# Patient Record
Sex: Male | Born: 1945
Health system: Southern US, Community
[De-identification: ages and names within clinical notes are randomized; demographics above are authoritative.]

## PROBLEM LIST (undated history)

## (undated) DIAGNOSIS — Z87891 Personal history of nicotine dependence: Secondary | ICD-10-CM

## (undated) DIAGNOSIS — Z0000 Encounter for general adult medical examination without abnormal findings: Secondary | ICD-10-CM

## (undated) DIAGNOSIS — I480 Paroxysmal atrial fibrillation: Secondary | ICD-10-CM

## (undated) DIAGNOSIS — E785 Hyperlipidemia, unspecified: Secondary | ICD-10-CM

## (undated) DIAGNOSIS — I10 Essential (primary) hypertension: Secondary | ICD-10-CM

## (undated) DIAGNOSIS — I2510 Atherosclerotic heart disease of native coronary artery without angina pectoris: Secondary | ICD-10-CM

## (undated) DIAGNOSIS — I639 Cerebral infarction, unspecified: Secondary | ICD-10-CM

## (undated) DIAGNOSIS — Z8619 Personal history of other infectious and parasitic diseases: Secondary | ICD-10-CM

## (undated) DIAGNOSIS — Z87442 Personal history of urinary calculi: Secondary | ICD-10-CM

## (undated) DIAGNOSIS — F4321 Adjustment disorder with depressed mood: Secondary | ICD-10-CM

## (undated) DIAGNOSIS — K589 Irritable bowel syndrome without diarrhea: Secondary | ICD-10-CM

## (undated) DIAGNOSIS — R001 Bradycardia, unspecified: Secondary | ICD-10-CM

## (undated) DIAGNOSIS — D649 Anemia, unspecified: Secondary | ICD-10-CM

## (undated) DIAGNOSIS — C9210 Chronic myeloid leukemia, BCR/ABL-positive, not having achieved remission: Principal | ICD-10-CM

## (undated) DIAGNOSIS — K219 Gastro-esophageal reflux disease without esophagitis: Secondary | ICD-10-CM

## (undated) HISTORY — DX: Personal history of other infectious and parasitic diseases: Z86.19

## (undated) HISTORY — DX: Chronic myeloid leukemia, BCR/ABL-positive, not having achieved remission: C92.10

## (undated) HISTORY — DX: Adjustment disorder with depressed mood: F43.21

## (undated) HISTORY — DX: Hyperlipidemia, unspecified: E78.5

## (undated) HISTORY — DX: Essential (primary) hypertension: I10

## (undated) HISTORY — DX: Personal history of nicotine dependence: Z87.891

## (undated) HISTORY — DX: Irritable bowel syndrome without diarrhea: K58.9

## (undated) HISTORY — DX: Personal history of urinary calculi: Z87.442

## (undated) HISTORY — DX: Encounter for general adult medical examination without abnormal findings: Z00.00

## (undated) HISTORY — DX: Gastro-esophageal reflux disease without esophagitis: K21.9

## (undated) HISTORY — PX: CATARACT EXTRACTION: SUR2

## (undated) HISTORY — DX: Anemia, unspecified: D64.9

---

## 1998-12-27 HISTORY — PX: CORONARY ANGIOPLASTY WITH STENT PLACEMENT: SHX49

## 2001-12-27 HISTORY — PX: CHOLECYSTECTOMY: SHX55

## 2011-06-21 ENCOUNTER — Emergency Department (INDEPENDENT_AMBULATORY_CARE_PROVIDER_SITE_OTHER): Payer: Self-pay

## 2011-06-21 ENCOUNTER — Emergency Department (HOSPITAL_BASED_OUTPATIENT_CLINIC_OR_DEPARTMENT_OTHER)
Admission: EM | Admit: 2011-06-21 | Discharge: 2011-06-21 | Disposition: A | Discharge status: Home / Self Care | Payer: Self-pay | Attending: Emergency Medicine | Admitting: Emergency Medicine

## 2011-06-21 DIAGNOSIS — I2510 Atherosclerotic heart disease of native coronary artery without angina pectoris: Secondary | ICD-10-CM | POA: Insufficient documentation

## 2011-06-21 DIAGNOSIS — R42 Dizziness and giddiness: Secondary | ICD-10-CM

## 2011-06-21 DIAGNOSIS — I10 Essential (primary) hypertension: Secondary | ICD-10-CM | POA: Insufficient documentation

## 2011-06-21 DIAGNOSIS — E7800 Pure hypercholesterolemia, unspecified: Secondary | ICD-10-CM | POA: Insufficient documentation

## 2011-06-21 DIAGNOSIS — R0789 Other chest pain: Secondary | ICD-10-CM

## 2011-06-21 DIAGNOSIS — E876 Hypokalemia: Secondary | ICD-10-CM | POA: Insufficient documentation

## 2011-06-21 LAB — CBC
HCT: 38 % — ABNORMAL LOW (ref 39.0–52.0)
MCH: 32 pg (ref 26.0–34.0)
MCHC: 35 g/dL (ref 30.0–36.0)
MCV: 91.6 fL (ref 78.0–100.0)
RDW: 14.2 % (ref 11.5–15.5)

## 2011-06-21 LAB — COMPREHENSIVE METABOLIC PANEL
Alkaline Phosphatase: 71 U/L (ref 39–117)
BUN: 17 mg/dL (ref 6–23)
Calcium: 9.4 mg/dL (ref 8.4–10.5)
GFR calc Af Amer: >60 mL/min (ref >60)
Glucose, Bld: 159 mg/dL — ABNORMAL HIGH (ref 70–99)
Total Protein: 7.3 g/dL (ref 6.0–8.3)

## 2011-06-21 LAB — URINALYSIS, ROUTINE W REFLEX MICROSCOPIC
Ketones, ur: NEGATIVE mg/dL
Leukocytes, UA: NEGATIVE
Nitrite: NEGATIVE
Specific Gravity, Urine: 1.022 (ref 1.005–1.030)
pH: 6 (ref 5.0–8.0)

## 2011-06-28 ENCOUNTER — Ambulatory Visit: Payer: Self-pay | Admitting: Hematology & Oncology

## 2011-07-16 ENCOUNTER — Ambulatory Visit (HOSPITAL_BASED_OUTPATIENT_CLINIC_OR_DEPARTMENT_OTHER): Payer: Self-pay | Admitting: Hematology & Oncology

## 2011-07-16 ENCOUNTER — Other Ambulatory Visit: Payer: Self-pay | Admitting: Hematology & Oncology

## 2011-07-16 DIAGNOSIS — C9210 Chronic myeloid leukemia, BCR/ABL-positive, not having achieved remission: Secondary | ICD-10-CM

## 2011-07-16 LAB — COMPREHENSIVE METABOLIC PANEL
Alkaline Phosphatase: 62 U/L (ref 39–117)
BUN: 16 mg/dL (ref 6–23)
Creatinine, Ser: 1.38 mg/dL — ABNORMAL HIGH (ref 0.50–1.35)
Glucose, Bld: 92 mg/dL (ref 70–99)
Total Bilirubin: 0.3 mg/dL (ref 0.3–1.2)

## 2011-07-16 LAB — CBC WITH DIFFERENTIAL (CANCER CENTER ONLY)
BASO#: 0.1 10*3/uL (ref 0.0–0.2)
BASO%: 1 % (ref 0.0–2.0)
EOS%: 5.9 % (ref 0.0–7.0)
HGB: 12 g/dL — ABNORMAL LOW (ref 13.0–17.1)
LYMPH#: 3.5 10*3/uL — ABNORMAL HIGH (ref 0.9–3.3)
MCHC: 35.1 g/dL (ref 32.0–35.9)
NEUT#: 5.5 10*3/uL (ref 1.5–6.5)
Platelets: 224 10*3/uL (ref 145–400)

## 2011-07-16 LAB — RETICULOCYTES (CHCC)
RBC.: 3.73 MIL/uL — ABNORMAL LOW (ref 4.22–5.81)
Retic Ct Pct: 0.8 % (ref 0.4–2.3)

## 2011-07-16 LAB — CHCC SATELLITE - SMEAR

## 2011-07-20 LAB — BCR/ABL (LIO MMD)

## 2011-09-29 ENCOUNTER — Other Ambulatory Visit: Payer: Self-pay | Admitting: Hematology & Oncology

## 2011-09-29 ENCOUNTER — Encounter: Payer: Self-pay | Admitting: Hematology & Oncology

## 2011-09-29 LAB — CBC WITH DIFFERENTIAL (CANCER CENTER ONLY)
Eosinophils Absolute: 0.6 10*3/uL — ABNORMAL HIGH (ref 0.0–0.5)
HCT: 33.3 % — ABNORMAL LOW (ref 38.7–49.9)
LYMPH#: 3.2 10*3/uL (ref 0.9–3.3)
LYMPH%: 30.1 % (ref 14.0–48.0)
MCV: 94 fL (ref 82–98)
MONO#: 0.9 10*3/uL (ref 0.1–0.9)
NEUT%: 54.9 % (ref 40.0–80.0)
RBC: 3.53 10*6/uL — ABNORMAL LOW (ref 4.20–5.70)
WBC: 10.6 10*3/uL — ABNORMAL HIGH (ref 4.0–10.0)

## 2011-09-29 LAB — BASIC METABOLIC PANEL
BUN: 12 mg/dL (ref 6–23)
Chloride: 103 mEq/L (ref 96–112)
Glucose, Bld: 80 mg/dL (ref 70–99)
Potassium: 4.3 mEq/L (ref 3.5–5.3)
Sodium: 138 mEq/L (ref 135–145)

## 2011-09-29 LAB — CHCC SATELLITE - SMEAR

## 2011-10-04 LAB — BCR/ABL (LIO MMD)

## 2011-12-16 ENCOUNTER — Ambulatory Visit (HOSPITAL_BASED_OUTPATIENT_CLINIC_OR_DEPARTMENT_OTHER): Payer: Self-pay | Admitting: Hematology & Oncology

## 2011-12-16 ENCOUNTER — Other Ambulatory Visit: Payer: Self-pay | Admitting: Hematology & Oncology

## 2011-12-16 ENCOUNTER — Encounter: Payer: Self-pay | Admitting: Hematology & Oncology

## 2011-12-16 ENCOUNTER — Other Ambulatory Visit (HOSPITAL_BASED_OUTPATIENT_CLINIC_OR_DEPARTMENT_OTHER): Payer: Self-pay | Admitting: Lab

## 2011-12-16 DIAGNOSIS — C9210 Chronic myeloid leukemia, BCR/ABL-positive, not having achieved remission: Secondary | ICD-10-CM | POA: Insufficient documentation

## 2011-12-16 HISTORY — DX: Chronic myeloid leukemia, BCR/ABL-positive, not having achieved remission: C92.10

## 2011-12-16 LAB — CBC WITH DIFFERENTIAL (CANCER CENTER ONLY)
BASO#: 0.1 10*3/uL (ref 0.0–0.2)
BASO%: 0.8 % (ref 0.0–2.0)
HCT: 36.3 % — ABNORMAL LOW (ref 38.7–49.9)
HGB: 12.4 g/dL — ABNORMAL LOW (ref 13.0–17.1)
LYMPH#: 3.1 10*3/uL (ref 0.9–3.3)
LYMPH%: 29.8 % (ref 14.0–48.0)
MCV: 96 fL (ref 82–98)
MONO#: 0.7 10*3/uL (ref 0.1–0.9)
NEUT%: 56.5 % (ref 40.0–80.0)
RDW: 13.7 % (ref 11.1–15.7)
WBC: 10.5 10*3/uL — ABNORMAL HIGH (ref 4.0–10.0)

## 2011-12-16 LAB — COMPREHENSIVE METABOLIC PANEL
AST: 19 U/L (ref 0–37)
Albumin: 4.4 g/dL (ref 3.5–5.2)
BUN: 16 mg/dL (ref 6–23)
CO2: 27 mEq/L (ref 19–32)
Calcium: 9.3 mg/dL (ref 8.4–10.5)
Chloride: 106 mEq/L (ref 96–112)
Creatinine, Ser: 1.19 mg/dL (ref 0.50–1.35)
Glucose, Bld: 81 mg/dL (ref 70–99)
Potassium: 3.7 mEq/L (ref 3.5–5.3)

## 2011-12-16 LAB — LACTATE DEHYDROGENASE: LDH: 153 U/L (ref 94–250)

## 2011-12-16 NOTE — Progress Notes (Signed)
This office note has been dictated.

## 2011-12-17 NOTE — Progress Notes (Signed)
CC:   Chauncy Lean, PA-C  DIAGNOSIS:  Chronic phase CML.  CURRENT THERAPY:  [name] 400 mg p.o. daily.  INTERIM HISTORY:  Mr. Grider comes in for his follow-up.  He is looking great.  He has done very well since we last saw him in October.  He has had no real complaints.  He does have occasional diarrhea.  I think he takes Imodium for this.  When we last saw him, his bcr/abl ratio was not detected.  As such, he still has maintained a major molecular remission.  He has not had any cough.  He has had no dysphagia or odynophagia.  He has had no nausea.  He has had no headache.  He has had no rashes.  Overall, his performance status is ECOG 0.  PHYSICAL EXAMINATION:  General Appearance:  This is a well-developed, well-nourished white gentleman in no obvious distress.  Vital Signs: 97.1, pulse 76, respiratory rate 20, blood pressure 123/73.  Weight is 205.  Head and Neck Exam:  Shows a normocephalic, atraumatic skull. There are no ocular or oral lesions.  He has no palpable cervical or supraclavicular lymph nodes.  Lungs:  Clear bilaterally.  Cardiac Exam: Regular rate and rhythm with a normal S1 and S2.  There are no murmurs, rubs or bruits.  Abdominal Exam:  Soft with good bowel sounds.  There is no palpable abdominal mass.  There is no fluid wave.  There is no palpable hepatosplenomegaly.  Back Exam:  No tenderness over the spine, ribs or hips.  Extremities:  Show no clubbing, cyanosis or edema.  He has good range of motion of his joints.  Skin Exam:  No rashes, ecchymosis, or petechia.  LABORATORY STUDIES:  White cell count is 10.5, hemoglobin 12.4, hematocrit 36.3, platelet count 256.  MCV is 96.  Peripheral smear shows good maturation of his white blood cells.  There are no hypersegmented polys.  I see no atypical lymphocytes.  There are no immature myeloid cells.  There are no blasts.  Red cells appear normochromic, normocytic.  There are no nucleated red blood  cells. Platelets are adequate in number and size.  IMPRESSION:  Mr. Closs is a 65 year old gentleman with chronic phase CML.  He is on [name].  He is doing well on [name].  He has maintained a major molecular remission.  We will plan to get him back in 3 more months now.  I think that once we see him back, if he still has maintained a major molecular remission, we can probably check him every 6 months.  He will continue Imodium for the intermittent diarrhea.  He has Lomotil if he needs it.    ______________________________ Josph Macho, M.D. PRE/MEDQ  D:  12/16/2011  T:  12/17/2011  Job:  [id number]

## 2011-12-22 LAB — BCR/ABL (LIO MMD)

## 2011-12-29 ENCOUNTER — Other Ambulatory Visit: Payer: Self-pay | Admitting: Hematology & Oncology

## 2012-02-21 ENCOUNTER — Encounter: Payer: Self-pay | Admitting: *Deleted

## 2012-02-21 NOTE — Progress Notes (Signed)
Pt's wife called asking to have her husband's HCTZ refilled. Reviewed all of his records. Did not find that Dr Myna Hidalgo had ever prescribed it. He has a history of HTN and hyperlipidemia. Returned his wife's call at [phone] and she stated that he did not like the MD who was prescribing his medication therefore he was not going back. She asked if there was someone in the same building as Dr Myna Hidalgo that he could go to. Gave her the # to Dr Rodena Medin. Explained the importance of having a PCP to deal with his blood pressure and high lipid conditions. She verbalized understanding and will give Dr Rodena Medin a call.

## 2012-02-24 ENCOUNTER — Encounter: Payer: Self-pay | Admitting: Internal Medicine

## 2012-02-24 ENCOUNTER — Ambulatory Visit (INDEPENDENT_AMBULATORY_CARE_PROVIDER_SITE_OTHER): Payer: Self-pay | Admitting: Internal Medicine

## 2012-02-24 DIAGNOSIS — Z87891 Personal history of nicotine dependence: Secondary | ICD-10-CM

## 2012-02-24 DIAGNOSIS — E782 Mixed hyperlipidemia: Secondary | ICD-10-CM | POA: Insufficient documentation

## 2012-02-24 DIAGNOSIS — F17200 Nicotine dependence, unspecified, uncomplicated: Secondary | ICD-10-CM

## 2012-02-24 DIAGNOSIS — I10 Essential (primary) hypertension: Secondary | ICD-10-CM | POA: Insufficient documentation

## 2012-02-24 DIAGNOSIS — E785 Hyperlipidemia, unspecified: Secondary | ICD-10-CM

## 2012-02-24 DIAGNOSIS — I2510 Atherosclerotic heart disease of native coronary artery without angina pectoris: Secondary | ICD-10-CM | POA: Insufficient documentation

## 2012-02-24 DIAGNOSIS — C9210 Chronic myeloid leukemia, BCR/ABL-positive, not having achieved remission: Secondary | ICD-10-CM

## 2012-02-24 DIAGNOSIS — Z720 Tobacco use: Secondary | ICD-10-CM

## 2012-02-24 HISTORY — DX: Personal history of nicotine dependence: Z87.891

## 2012-02-24 LAB — LIPID PANEL: LDL Cholesterol: 107 mg/dL — ABNORMAL HIGH (ref 0–99)

## 2012-02-24 MED ORDER — HYDROCHLOROTHIAZIDE 25 MG PO TABS: 25.0000 mg | ORAL_TABLET | Freq: Every day | ORAL | Status: DC

## 2012-02-24 MED ORDER — LISINOPRIL 40 MG PO TABS: 40.0000 mg | ORAL_TABLET | Freq: Every day | ORAL | Status: DC

## 2012-02-24 NOTE — Assessment & Plan Note (Signed)
Normotensive and stable. Continue current regimen. Monitor bp as outpt and followup in clinic as scheduled. rf medications. chem7 12/12.

## 2012-02-24 NOTE — Progress Notes (Signed)
  Subjective:    Patient ID: Jason [name], male    DOB: 06-03-1946, 66 y.o.   MRN: [id number]  HPI Pt presents to clinic to establish care and for follow up of multiple medical problems. H/o CML under treatment of H/O with gleevac. Tolerates medication well now that he takes it at night. BP under average control. Recalls h/o CAD with stent placement followed by Dr. Chales Abrahams of cardiology. Believes underwent unremarkable stress test within the last ~ 1 year. Does continue to smoke tobacco daily-understands the potential health dangers and is currently contemplative. Thinks he may take lipitor (unknown dosage) but does not have it on his medication list. Not interested in tetanus, zostavax or colonoscopy.   Past Medical History  Diagnosis Date  . CML (chronic myelocytic leukemia) 12/16/2011   No past surgical history on file.  reports that he has been smoking Cigarettes.  He has never used smokeless tobacco. He reports that he drinks alcohol. He reports that he does not use illicit drugs. family history is not on file. No Known Allergies   Review of Systems  Constitutional: Positive for fever.  Respiratory: Negative for cough and shortness of breath.   Cardiovascular: Negative for chest pain.  Gastrointestinal: Negative for abdominal pain.  All other systems reviewed and are negative.       Objective:   Physical Exam  Physical Exam  Nursing note and vitals reviewed. Constitutional: Appears well-developed and well-nourished. No distress.  HENT:  Head: Normocephalic and atraumatic.  Right Ear: External ear normal.  Left Ear: External ear normal.  Eyes: Conjunctivae are normal. No scleral icterus.  Neck: Neck supple. Carotid bruit is not present.  Cardiovascular: Normal rate, regular rhythm and normal heart sounds.  Exam reveals no gallop and no friction rub.   No murmur heard. Pulmonary/Chest: Effort normal and breath sounds normal. No respiratory distress. He has no wheezes. no  rales.  Lymphadenopathy:    He has no cervical adenopathy.  Neurological:Alert.  Skin: Skin is warm and dry. Not diaphoretic.  Psychiatric: Has a normal mood and affect.        Assessment & Plan:

## 2012-02-24 NOTE — Assessment & Plan Note (Signed)
Followed by cardiology. Asx. Continue asa and risk factor modification.

## 2012-02-24 NOTE — Assessment & Plan Note (Signed)
Counseled regarding the need for cessation. Understands the risks in tobacco use including but not limited to MI, lung/oral/head/neck malignancy and copd. Currently contemplative. Total time of discussion ~4 minutes

## 2012-02-24 NOTE — Assessment & Plan Note (Signed)
Obtain lipid profile. Check lipitor dose at home and notify clinic. lft nl 12/12

## 2012-03-03 ENCOUNTER — Telehealth: Payer: Self-pay | Admitting: Internal Medicine

## 2012-03-03 DIAGNOSIS — E785 Hyperlipidemia, unspecified: Secondary | ICD-10-CM

## 2012-03-03 MED ORDER — ATORVASTATIN CALCIUM 10 MG PO TABS: 10.0000 mg | ORAL_TABLET | Freq: Every day | ORAL | Status: DC

## 2012-03-03 NOTE — Telephone Encounter (Signed)
Call placed to patient at [phone], he was informed per Dr Rodena Medin instructions and has verbalized understanding. Rx for Lipitor 10 mg sent to pharmacy on file. Lab order entered for April 2013.

## 2012-03-03 NOTE — Telephone Encounter (Signed)
Call returned to patient at [phone], patients wife Jason [name] stated patient requested her to find out what was needed. She was asked for patients current dose of Lipitor. She stated patient has a 10 mg tablet, but he has not been taking the medication.

## 2012-03-03 NOTE — Telephone Encounter (Signed)
Recommend resume lipitor 10mg  qd. Recheck lipid/lft one month 272.4

## 2012-03-22 ENCOUNTER — Ambulatory Visit (HOSPITAL_BASED_OUTPATIENT_CLINIC_OR_DEPARTMENT_OTHER): Payer: Self-pay | Admitting: Hematology & Oncology

## 2012-03-22 ENCOUNTER — Other Ambulatory Visit (HOSPITAL_BASED_OUTPATIENT_CLINIC_OR_DEPARTMENT_OTHER): Payer: Self-pay | Admitting: Lab

## 2012-03-22 VITALS — BP 133/81 | HR 75 | Temp 97.0°F | Ht 66.75 in | Wt 207.0 lb

## 2012-03-22 DIAGNOSIS — C9210 Chronic myeloid leukemia, BCR/ABL-positive, not having achieved remission: Secondary | ICD-10-CM

## 2012-03-22 LAB — CBC WITH DIFFERENTIAL (CANCER CENTER ONLY)
BASO#: 0.1 10*3/uL (ref 0.0–0.2)
Eosinophils Absolute: 0.7 10*3/uL — ABNORMAL HIGH (ref 0.0–0.5)
HGB: 12.5 g/dL — ABNORMAL LOW (ref 13.0–17.1)
LYMPH#: 2.8 10*3/uL (ref 0.9–3.3)
MONO#: 0.8 10*3/uL (ref 0.1–0.9)
NEUT#: 5.1 10*3/uL (ref 1.5–6.5)
Platelets: 222 10*3/uL (ref 145–400)
RBC: 3.88 10*6/uL — ABNORMAL LOW (ref 4.20–5.70)
WBC: 9.5 10*3/uL (ref 4.0–10.0)

## 2012-03-22 LAB — COMPREHENSIVE METABOLIC PANEL
ALT: 20 U/L (ref 0–53)
Albumin: 4.6 g/dL (ref 3.5–5.2)
CO2: 26 mEq/L (ref 19–32)
Calcium: 9.4 mg/dL (ref 8.4–10.5)
Chloride: 105 mEq/L (ref 96–112)
Glucose, Bld: 87 mg/dL (ref 70–99)
Potassium: 3.4 mEq/L — ABNORMAL LOW (ref 3.5–5.3)
Sodium: 143 mEq/L (ref 135–145)
Total Bilirubin: 0.4 mg/dL (ref 0.3–1.2)
Total Protein: 7 g/dL (ref 6.0–8.3)

## 2012-03-22 LAB — LACTATE DEHYDROGENASE: LDH: 142 U/L (ref 94–250)

## 2012-03-22 LAB — CHCC SATELLITE - SMEAR

## 2012-03-22 NOTE — Progress Notes (Signed)
This office note has been dictated.

## 2012-03-22 NOTE — Progress Notes (Signed)
CC:   Chauncy Lean, PA-C  DIAGNOSIS:  Chronic phase chronic myelogenous leukemia, molecular remission.  CURRENT THERAPY:  [name] 400 mg p.o. daily.  INTERIM HISTORY:  Mr. Jason [name] comes in for followup.  We see him every 3 months or so.  When we last saw him in December, his BCR/ABL was not detectable.  He feels well.  He has had no complaints.  He has had some loose stools. He is on a probiotic for this.  He has not had any issues with respect to blood sugars.  He has had no problems with high blood pressure.  He is still smoking.  He smokes about 5 cigarettes a day.  He has not had any problems with bleeding or bruising.  He has had no nausea or vomiting.  PHYSICAL EXAMINATION:  General:  This is a well-developed, well- nourished white gentleman in no obvious distress.  Vital Signs:  Show a temperature of 97, pulse 75, respiratory rate 14, blood pressure is 133/81.  Weight is 207.  Head and Neck Exam:  Shows a normocephalic, atraumatic skull.  There are no ocular or oral lesions.  There are no palpable cervical or supraclavicular lymph nodes.  Lungs:  Clear bilaterally.  Cardiac Exam:  Regular rate and rhythm with normal S1 and S2.  There are no murmurs, rubs or bruits.  Abdominal Exam:  Soft with good bowel sounds.  There is no palpable abdominal mass.  There is no fluid wave.  There is no palpable hepatosplenomegaly.  Extremities: Show no clubbing, cyanosis, or edema.  Neurological Exam:  Shows no focal neurological deficits.  LABORATORY STUDIES:  White cell count 9.5, hemoglobin 12.5, hematocrit 37, platelet count 222.  IMPRESSION:  Mr. Jason [name] is a nice 66 year old gentleman with chronic phase chronic myelogenous leukemia.  He is in a major molecular remission.  He is having his BCR/ABL ratios checked with each visit.  I really think that we can get him back now in 4 months.  I do not see that we need any blood work in between  visits.    ______________________________ Jason [name], M.D. PRE/MEDQ  D:  03/22/2012  T:  03/22/2012  Job:  [id number]  ADDENDUM:  BCR/ABL is (-).  He is in MMR.

## 2012-03-29 ENCOUNTER — Other Ambulatory Visit: Payer: Self-pay | Admitting: Hematology & Oncology

## 2012-04-03 ENCOUNTER — Telehealth: Payer: Self-pay | Admitting: *Deleted

## 2012-04-03 NOTE — Telephone Encounter (Addendum)
Message copied by Mirian Capuchin on Mon Apr 03, 2012  4:27 PM ------      Message from: Josph Macho      Created: Sun Apr 02, 2012  2:45 PM       Call- CML still in remission. Pt's wife given this message.  Voiced understanding.

## 2012-06-23 DIAGNOSIS — H268 Other specified cataract: Secondary | ICD-10-CM | POA: Insufficient documentation

## 2012-07-03 ENCOUNTER — Emergency Department (HOSPITAL_BASED_OUTPATIENT_CLINIC_OR_DEPARTMENT_OTHER): Payer: Self-pay

## 2012-07-03 ENCOUNTER — Emergency Department (HOSPITAL_BASED_OUTPATIENT_CLINIC_OR_DEPARTMENT_OTHER)
Admission: EM | Admit: 2012-07-03 | Discharge: 2012-07-03 | Disposition: A | Discharge status: Home / Self Care | Payer: Self-pay | Attending: Emergency Medicine | Admitting: Emergency Medicine

## 2012-07-03 ENCOUNTER — Encounter (HOSPITAL_BASED_OUTPATIENT_CLINIC_OR_DEPARTMENT_OTHER): Payer: Self-pay | Admitting: *Deleted

## 2012-07-03 DIAGNOSIS — R51 Headache: Secondary | ICD-10-CM | POA: Insufficient documentation

## 2012-07-03 DIAGNOSIS — Z87442 Personal history of urinary calculi: Secondary | ICD-10-CM | POA: Insufficient documentation

## 2012-07-03 DIAGNOSIS — R110 Nausea: Secondary | ICD-10-CM | POA: Insufficient documentation

## 2012-07-03 DIAGNOSIS — R42 Dizziness and giddiness: Secondary | ICD-10-CM | POA: Insufficient documentation

## 2012-07-03 DIAGNOSIS — Z7982 Long term (current) use of aspirin: Secondary | ICD-10-CM | POA: Insufficient documentation

## 2012-07-03 DIAGNOSIS — Z79899 Other long term (current) drug therapy: Secondary | ICD-10-CM | POA: Insufficient documentation

## 2012-07-03 DIAGNOSIS — E785 Hyperlipidemia, unspecified: Secondary | ICD-10-CM | POA: Insufficient documentation

## 2012-07-03 DIAGNOSIS — F17200 Nicotine dependence, unspecified, uncomplicated: Secondary | ICD-10-CM | POA: Insufficient documentation

## 2012-07-03 DIAGNOSIS — I10 Essential (primary) hypertension: Secondary | ICD-10-CM | POA: Insufficient documentation

## 2012-07-03 DIAGNOSIS — R209 Unspecified disturbances of skin sensation: Secondary | ICD-10-CM | POA: Insufficient documentation

## 2012-07-03 DIAGNOSIS — R197 Diarrhea, unspecified: Secondary | ICD-10-CM | POA: Insufficient documentation

## 2012-07-03 LAB — CBC WITH DIFFERENTIAL/PLATELET
Eosinophils Relative: 6 % — ABNORMAL HIGH (ref 0–5)
HCT: 39 % (ref 39.0–52.0)
Hemoglobin: 13.4 g/dL (ref 13.0–17.0)
Lymphocytes Relative: 30 % (ref 12–46)
Lymphs Abs: 3.1 10*3/uL (ref 0.7–4.0)
MCV: 92.6 fL (ref 78.0–100.0)
Monocytes Relative: 9 % (ref 3–12)
Platelets: 237 10*3/uL (ref 150–400)
RBC: 4.21 MIL/uL — ABNORMAL LOW (ref 4.22–5.81)
WBC: 10.2 10*3/uL (ref 4.0–10.5)

## 2012-07-03 LAB — COMPREHENSIVE METABOLIC PANEL
ALT: 22 U/L (ref 0–53)
Alkaline Phosphatase: 66 U/L (ref 39–117)
CO2: 30 mEq/L (ref 19–32)
Calcium: 9.5 mg/dL (ref 8.4–10.5)
GFR calc Af Amer: 65 mL/min — ABNORMAL LOW (ref >90)
GFR calc non Af Amer: 56 mL/min — ABNORMAL LOW (ref >90)
Glucose, Bld: 95 mg/dL (ref 70–99)
Sodium: 142 mEq/L (ref 135–145)

## 2012-07-03 LAB — URINALYSIS, ROUTINE W REFLEX MICROSCOPIC
Bilirubin Urine: NEGATIVE
Ketones, ur: NEGATIVE mg/dL
Nitrite: NEGATIVE
Specific Gravity, Urine: 1.017 (ref 1.005–1.030)
Urobilinogen, UA: 0.2 mg/dL (ref 0.0–1.0)

## 2012-07-03 MED ORDER — SODIUM CHLORIDE 0.9 % IV BOLUS (SEPSIS)
500.0000 mL | Freq: Once | INTRAVENOUS | Status: AC
  Administered 2012-07-03: 500 mL via INTRAVENOUS

## 2012-07-03 MED ORDER — ONDANSETRON 8 MG PO TBDP: 8.0000 mg | ORAL_TABLET | Freq: Three times a day (TID) | ORAL | Status: AC | PRN

## 2012-07-03 MED ORDER — ONDANSETRON 8 MG PO TBDP
8.0000 mg | ORAL_TABLET | Freq: Once | ORAL | Status: AC
  Administered 2012-07-03: 8 mg via ORAL
  Filled 2012-07-03: qty 1

## 2012-07-03 NOTE — ED Notes (Signed)
Patient states he has had diarrhea multiple times a day for the last 2 days.

## 2012-07-03 NOTE — ED Provider Notes (Signed)
History     CSN: [id number]  Arrival date & time 07/03/12  1239   First MD Initiated Contact with Patient 07/03/12 1350      2:29 PM HPI Patient reports Dizziness that he describes as feeling flushed and light headed. States symptoms began 2 days ago and have been constant. Reports associated with nausea and left hand tingling. Denies headache, CP, SOB, aphasia, ataxia, recent URI, h/o CVA or TIA. Pt has a H/o 2 stents and similar symptoms. Reports last time he had dizziness his potassium was low. Reports diarrhea but this is typical due to his medications The history is provided by the patient.    Past Medical History  Diagnosis Date  . CML (chronic myelocytic leukemia) 12/16/2011  . History of chicken pox   . Hypertension   . Hyperlipidemia   . History of kidney stones     Past Surgical History  Procedure Date  . Cholecystectomy 2003  . Coronary angioplasty with stent placement 2000    Family History  Problem Relation Age of Onset  . Arthritis Mother   . Hypertension Mother   . Hypertension Father   . Colon cancer Neg Hx   . Breast cancer Neg Hx   . Heart disease Neg Hx   . Rheumatic fever Mother   . Diabetes Neg Hx   . Prostate cancer Neg Hx     History  Substance Use Topics  . Smoking status: Current Everyday Smoker    Types: Cigarettes  . Smokeless tobacco: Never Used   Comment: 40 yr history-i/pdd  . Alcohol Use: Yes      Review of Systems  Constitutional: Negative for fever, chills and diaphoresis.  HENT: Negative for ear pain, congestion, sore throat, rhinorrhea, neck pain, neck stiffness and sinus pressure.   Respiratory: Negative for shortness of breath.   Cardiovascular: Negative for chest pain.  Gastrointestinal: Positive for nausea. Negative for vomiting.  Neurological: Positive for dizziness and light-headedness. Negative for seizures, syncope, facial asymmetry, speech difficulty, weakness, numbness and headaches.  All other systems reviewed  and are negative.    Allergies  Review of patient's allergies indicates no known allergies.  Home Medications   Current Outpatient Rx  Name Route Sig Dispense Refill  . ASPIRIN 81 MG PO TABS Oral Take 81 mg by mouth daily.      . ATORVASTATIN CALCIUM 10 MG PO TABS Oral Take 1 tablet (10 mg total) by mouth daily. 30 tablet 3  . HYDROCHLOROTHIAZIDE 25 MG PO TABS Oral Take 1 tablet (25 mg total) by mouth daily. 30 tablet 6  . IMATINIB MESYLATE 400 MG PO TABS Oral Take 400 mg by mouth daily. Take with meals and large glass of water.Caution:Chemotherapy.     Marland Kitchen LISINOPRIL 40 MG PO TABS Oral Take 1 tablet (40 mg total) by mouth daily. 30 tablet 6  . MULTI-VITAMIN/MINERALS PO TABS Oral Take 1 tablet by mouth daily.      Marland Kitchen PROBIOTIC FORMULA PO Oral Take by mouth daily.      BP 132/75  Pulse 66  Temp 98.4 F (36.9 C) (Oral)  Resp 20  SpO2 99%  Physical Exam  Constitutional: He is oriented to person, place, and time. He appears well-developed and well-nourished.  HENT:  Head: Normocephalic and atraumatic.  Eyes: Conjunctivae are normal. Pupils are equal, round, and reactive to light.  Neck: Normal range of motion. Neck supple. No spinous process tenderness and no muscular tenderness present. No rigidity. Normal range of motion present.  Cardiovascular: Normal rate, regular rhythm and normal heart sounds.  Exam reveals no gallop and no friction rub.   No murmur heard. Pulmonary/Chest: Effort normal and breath sounds normal. He has no wheezes. He has no rales. He exhibits no tenderness.  Abdominal: Soft. Bowel sounds are normal.  Neurological: He is alert and oriented to person, place, and time. No cranial nerve deficit (tested CN III-XII). Coordination (no facial droop, past pointing, or pronator drift. ambulating normally) normal.  Skin: Skin is warm and dry. No rash noted. No erythema. No pallor.  Psychiatric: He has a normal mood and affect. His behavior is normal.    ED Course    Procedures Results for orders placed during the hospital encounter of 07/03/12  CBC WITH DIFFERENTIAL      Component Value Range   WBC 10.2  4.0 - 10.5 K/uL   RBC 4.21 (*) 4.22 - 5.81 MIL/uL   Hemoglobin 13.4  13.0 - 17.0 g/dL   HCT 65.7  84.6 - 96.2 %   MCV 92.6  78.0 - 100.0 fL   MCH 31.8  26.0 - 34.0 pg   MCHC 34.4  30.0 - 36.0 g/dL   RDW 95.2  84.1 - 32.4 %   Platelets 237  150 - 400 K/uL   Neutrophils Relative 54  43 - 77 %   Neutro Abs 5.5  1.7 - 7.7 K/uL   Lymphocytes Relative 30  12 - 46 %   Lymphs Abs 3.1  0.7 - 4.0 K/uL   Monocytes Relative 9  3 - 12 %   Monocytes Absolute 0.9  0.1 - 1.0 K/uL   Eosinophils Relative 6 (*) 0 - 5 %   Eosinophils Absolute 0.6  0.0 - 0.7 K/uL   Basophils Relative 1  0 - 1 %   Basophils Absolute 0.1  0.0 - 0.1 K/uL  COMPREHENSIVE METABOLIC PANEL      Component Value Range   Sodium 142  135 - 145 mEq/L   Potassium 4.8  3.5 - 5.1 mEq/L   Chloride 103  96 - 112 mEq/L   CO2 30  19 - 32 mEq/L   Glucose, Bld 95  70 - 99 mg/dL   BUN 12  6 - 23 mg/dL   Creatinine, Ser 4.01  0.50 - 1.35 mg/dL   Calcium 9.5  8.4 - 02.7 mg/dL   Total Protein 7.4  6.0 - 8.3 g/dL   Albumin 4.5  3.5 - 5.2 g/dL   AST 20  0 - 37 U/L   ALT 22  0 - 53 U/L   Alkaline Phosphatase 66  39 - 117 U/L   Total Bilirubin 0.3  0.3 - 1.2 mg/dL   GFR calc non Af Amer 56 (*) >90 mL/min   GFR calc Af Amer 65 (*) >90 mL/min  URINALYSIS, ROUTINE W REFLEX MICROSCOPIC      Component Value Range   Color, Urine YELLOW  YELLOW   APPearance CLEAR  CLEAR   Specific Gravity, Urine 1.017  1.005 - 1.030   pH 5.5  5.0 - 8.0   Glucose, UA NEGATIVE  NEGATIVE mg/dL   Hgb urine dipstick NEGATIVE  NEGATIVE   Bilirubin Urine NEGATIVE  NEGATIVE   Ketones, ur NEGATIVE  NEGATIVE mg/dL   Protein, ur NEGATIVE  NEGATIVE mg/dL   Urobilinogen, UA 0.2  0.0 - 1.0 mg/dL   Nitrite NEGATIVE  NEGATIVE   Leukocytes, UA NEGATIVE  NEGATIVE  TROPONIN I      Component Value  Range   Troponin I <0.30  <0.30  ng/mL   Ct Head Wo Contrast  07/03/2012  *RADIOLOGY REPORT*  Clinical Data: Dizziness.  Headache and nausea for 2 days. Leukemia.  CT HEAD WITHOUT CONTRAST  Technique:  Contiguous axial images were obtained from the base of the skull through the vertex without contrast.  Comparison: None.  Findings: Bone windows demonstrate minimal fluid within the bilateral maxillary sinuses.  Ethmoid air cell mucosal thickening. Fluid or mucosal thickening in the sphenoid sinuses. Clear mastoid air cells.  Soft tissue windows demonstrate no  mass lesion, hemorrhage, hydrocephalus, acute infarct, intra-axial, or extra-axial fluid collection.  IMPRESSION:  1. No acute intracranial abnormality. 2.  Sinus disease.  Original Report Authenticated By: Consuello Bossier, M.D.   ED ECG REPORT   Date: 07/03/2012  EKG Time: 5:06 PM  Rate: 58  Rhythm: sinus bradycardia  Axis: left   Intervals:right bundle branch block and left anterior fascicular block  ST&T Change: nonspecific T wave changes  Narrative Interpretation: no significant changes since 06/21/11               MDM  Low suspicion for CVA. However discussed with patient to r/o Stroke we would need transfer to [facility] for MRI. Patient and family do not want to do this but request an outpatient MRI. I feel this is reasonable since patient has a normal neuro exam and Sxs have been ongoing for 2 days. Advised immediate return to ED for worsening symptoms. Pt voices understanding and is ready for d/c  Discussed plan with Dr. Javier Glazier, PA-C 07/03/12 1627  Thomasene Lot, PA-C 07/03/12 1708

## 2012-07-03 NOTE — ED Notes (Signed)
Patient states he has had dizziness and nausea for the last 2 days.  States he has had same symptoms in the past and had a low potassium.

## 2012-07-04 ENCOUNTER — Ambulatory Visit (HOSPITAL_BASED_OUTPATIENT_CLINIC_OR_DEPARTMENT_OTHER)
Admit: 2012-07-04 | Discharge: 2012-07-04 | Disposition: A | Discharge status: Home / Self Care | Payer: Self-pay | Attending: Emergency Medicine | Admitting: Emergency Medicine

## 2012-07-04 DIAGNOSIS — R209 Unspecified disturbances of skin sensation: Secondary | ICD-10-CM | POA: Insufficient documentation

## 2012-07-04 DIAGNOSIS — R42 Dizziness and giddiness: Secondary | ICD-10-CM | POA: Insufficient documentation

## 2012-07-05 ENCOUNTER — Ambulatory Visit (HOSPITAL_BASED_OUTPATIENT_CLINIC_OR_DEPARTMENT_OTHER): Payer: Self-pay | Admitting: Hematology & Oncology

## 2012-07-05 ENCOUNTER — Other Ambulatory Visit (HOSPITAL_BASED_OUTPATIENT_CLINIC_OR_DEPARTMENT_OTHER): Payer: Self-pay | Admitting: Lab

## 2012-07-05 VITALS — BP 139/75 | HR 66 | Temp 97.4°F | Ht 67.0 in | Wt 204.0 lb

## 2012-07-05 DIAGNOSIS — C9210 Chronic myeloid leukemia, BCR/ABL-positive, not having achieved remission: Secondary | ICD-10-CM

## 2012-07-05 DIAGNOSIS — F17200 Nicotine dependence, unspecified, uncomplicated: Secondary | ICD-10-CM

## 2012-07-05 DIAGNOSIS — C9211 Chronic myeloid leukemia, BCR/ABL-positive, in remission: Secondary | ICD-10-CM

## 2012-07-05 DIAGNOSIS — R51 Headache: Secondary | ICD-10-CM

## 2012-07-05 LAB — CBC WITH DIFFERENTIAL (CANCER CENTER ONLY)
BASO%: 0.9 % (ref 0.0–2.0)
LYMPH%: 26.6 % (ref 14.0–48.0)
MCH: 32.4 pg (ref 28.0–33.4)
MCHC: 34.3 g/dL (ref 32.0–35.9)
MCV: 95 fL (ref 82–98)
MONO%: 7.1 % (ref 0.0–13.0)
Platelets: 218 10*3/uL (ref 145–400)
RDW: 13.8 % (ref 11.1–15.7)

## 2012-07-05 LAB — COMPREHENSIVE METABOLIC PANEL
ALT: 19 U/L (ref 0–53)
Alkaline Phosphatase: 64 U/L (ref 39–117)
Sodium: 143 mEq/L (ref 135–145)
Total Bilirubin: 0.4 mg/dL (ref 0.3–1.2)
Total Protein: 6.8 g/dL (ref 6.0–8.3)

## 2012-07-05 NOTE — ED Provider Notes (Signed)
Medical screening examination/treatment/procedure(s) were conducted as a shared visit with non-physician practitioner(s) and myself.  I personally evaluated the patient during the encounter   Dizziness, mild frontal headache. Left hand tingling. No facial numbness or tingling. No neck pain. Neuro exam unremarkable. CT head unremarkable. Extensive discussed re: atypical stroke potential. Refusing transport to [facility] for MRI.  Forbes Cellar, MD 07/05/12 [phone]

## 2012-07-05 NOTE — Progress Notes (Signed)
CC:   Marguarite Arbour, MD  DIAGNOSIS:  Chronic phase chronic myelogenous leukemia, major molecular remission.  CURRENT THERAPY:  [name] 400 mg p.o. daily.  INTERIM HISTORY:  Jason [name] comes in for followup.  We last saw him back in March.  At that point in time, a BCR/ABL gene rearrangement was not detectable.  As such, he is in a major molecular remission.  He is not feeling well.  He went to the emergency room earlier this week.  He says he has been having headaches.  He said he has had some numbness in his, I think, right hand.  He has had just some fatigue.  He has had some abdominal discomfort.  He has had some chest discomfort.  In the emergency room, he underwent a thorough evaluation.  He did have an MRI of the brain.  This showed no acute abnormality.  He had moderate __________ white matter and right thalamic signal changes.  It is felt that this would favor chronic small vessel ischemia.  There are no masses noted.  There is no fluid.  He just is not all that hungry.  He has had no diarrhea.  He has had no fever.  He has had no mouth sores.  PHYSICAL EXAMINATION:  General:  This is a slightly pale-appearing white gentleman in no obvious distress.  Vital Signs:  97.4, pulse 66, respiratory rate 18, blood pressure 139/75.  Weight is 204.  Head and Neck Exam:  Shows a normocephalic, atraumatic skull.  There are no ocular or oral lesions.  Lymphatic:  There are no palpable cervical or supraclavicular lymph nodes.  Lungs:  Clear bilaterally.  There are no rales, wheezes or rhonchi.  Cardiac Exam:  Regular rate and rhythm with a normal S1, S2.  There are no murmurs, rubs or bruits.  Abdominal Exam: Soft with good bowel sounds.  There is no palpable abdominal mass. There is no fluid wave.  There is no palpable hepatosplenomegaly.  Back Exam:  No tenderness over the spine, ribs or hips.  Extremities:  Show no clubbing, cyanosis or edema.  Neurological Exam:  Shows no  focal neurological deficits.  LABORATORY STUDIES:  White cell count is 8.7, hemoglobin 13.4, hematocrit 39.1, platelet count 218.  IMPRESSION:  Mr. Jason [name] is a 66 year old gentleman with a history of chronic phase chronic myelogenous leukemia.  He is on [name].  He has attained a major molecular remission.  I cannot imagine his current issues being related at all to the chronic myelogenous leukemia or to the [name].  He is still smoking.  This certainly may be causing a problem.  He does state that he takes aspirin.  He is also on Lipitor.  I will plan to get him back in about 4-6 weeks just for followup.  It sounds like he may need to go see a neurologist to gain some insight as to what might be going on within the brain.    ______________________________ Josph Macho, M.D. PRE/MEDQ  D:  07/05/2012  T:  07/05/2012  Job:  [id number]

## 2012-07-05 NOTE — Progress Notes (Signed)
This office note has been dictated.

## 2012-07-06 ENCOUNTER — Ambulatory Visit (INDEPENDENT_AMBULATORY_CARE_PROVIDER_SITE_OTHER): Payer: Self-pay | Admitting: Internal Medicine

## 2012-07-06 ENCOUNTER — Encounter: Payer: Self-pay | Admitting: Internal Medicine

## 2012-07-06 VITALS — BP 126/72 | HR 66 | Temp 97.8°F | Resp 18 | Wt 204.1 lb

## 2012-07-06 DIAGNOSIS — R110 Nausea: Secondary | ICD-10-CM

## 2012-07-06 MED ORDER — DEXLANSOPRAZOLE 60 MG PO CPDR: 60.0000 mg | DELAYED_RELEASE_CAPSULE | Freq: Every day | ORAL | Status: DC

## 2012-07-06 NOTE — Progress Notes (Signed)
  Subjective:    Patient ID: Jason [name], male    DOB: Apr 04, 1946, 66 y.o.   MRN: [id number]  HPI Pt presents to clinic for evaluation of multiple complaints. Notes 5d h/o nausea and dizziness. Has associated epigastric to low chest discomfort intermittently. Pain is nonexertional and does not radiate. Recently taking aleve regularly now stopped. Began pepcid and feels like may improve sx's. Recent ED evaluation with cbc, chem7, ua, ekg as well as head ct and head mri. Has had intermittent ha but attributes to cataract causing strain. Denies focal weakness or other stroke like sx's. No other alleviating or exacerbating factors.   Past Medical History  Diagnosis Date  . CML (chronic myelocytic leukemia) 12/16/2011  . History of chicken pox   . Hypertension   . Hyperlipidemia   . History of kidney stones    Past Surgical History  Procedure Date  . Cholecystectomy 2003  . Coronary angioplasty with stent placement 2000    reports that he has been smoking Cigarettes.  He has never used smokeless tobacco. He reports that he drinks alcohol. He reports that he does not use illicit drugs. family history includes Arthritis in his mother; Hypertension in his father and mother; and Rheumatic fever in his mother.  There is no history of Colon cancer, and Breast cancer, and Heart disease, and Diabetes, and Prostate cancer, . No Known Allergies   Review of Systems see hpi     Objective:   Physical Exam  Nursing note and vitals reviewed. Constitutional: He appears well-developed and well-nourished. No distress.  HENT:  Head: Normocephalic and atraumatic.  Right Ear: Tympanic membrane, external ear and ear canal normal.  Left Ear: Tympanic membrane, external ear and ear canal normal.  Eyes: Conjunctivae are normal. No scleral icterus.  Neck: Neck supple.  Cardiovascular: Normal rate, regular rhythm and normal heart sounds.  Exam reveals no gallop and no friction rub.   No murmur  heard. Pulmonary/Chest: Effort normal and breath sounds normal.  Abdominal: Soft. Normal appearance and bowel sounds are normal. He exhibits no distension and no mass. There is no hepatosplenomegaly. There is tenderness in the periumbilical area. There is no rebound and no guarding.  Neurological: He is alert.  Skin: Skin is warm and dry. He is not diaphoretic.  Psychiatric: He has a normal mood and affect.          Assessment & Plan:

## 2012-07-06 NOTE — Assessment & Plan Note (Signed)
With associated mild abd tenderness and nsaid use. Avoid nsaids/etoh. Begin dexilant samples qd. Obtain amylase/lipase. Schedule close f/u.

## 2012-07-07 ENCOUNTER — Telehealth: Payer: Self-pay | Admitting: Hematology & Oncology

## 2012-07-07 LAB — LIPASE: Lipase: 38 U/L (ref 0–75)

## 2012-07-07 NOTE — Telephone Encounter (Signed)
Faxed completed application to [employer] for renewal of financial assistance for [employer].

## 2012-07-13 ENCOUNTER — Encounter: Payer: Self-pay | Admitting: Internal Medicine

## 2012-07-13 ENCOUNTER — Ambulatory Visit (INDEPENDENT_AMBULATORY_CARE_PROVIDER_SITE_OTHER): Payer: Self-pay | Admitting: Internal Medicine

## 2012-07-13 VITALS — BP 134/70 | HR 66 | Temp 97.7°F | Resp 18 | Ht 66.75 in | Wt 206.0 lb

## 2012-07-13 DIAGNOSIS — R110 Nausea: Secondary | ICD-10-CM

## 2012-07-13 NOTE — Progress Notes (Signed)
  Subjective:    Patient ID: Jason [name], male    DOB: 1946-09-01, 66 y.o.   MRN: [id number]  HPI Pt presents to clinic for follow up of nausea. Notes significant improvement of nausea. Has 5 days remaining of dexilent. Avoiding nsaids currently. Does note that he was off gleevac for one week and felt better. Resumed medication and noted some side effects including loose stools. Plans to discuss with H/O at appointment within 1-2 weeks.   Past Medical History  Diagnosis Date  . CML (chronic myelocytic leukemia) 12/16/2011  . History of chicken pox   . Hypertension   . Hyperlipidemia   . History of kidney stones    Past Surgical History  Procedure Date  . Cholecystectomy 2003  . Coronary angioplasty with stent placement 2000    reports that he has been smoking Cigarettes.  He has never used smokeless tobacco. He reports that he drinks alcohol. He reports that he does not use illicit drugs. family history includes Arthritis in his mother; Hypertension in his father and mother; and Rheumatic fever in his mother.  There is no history of Colon cancer, and Breast cancer, and Heart disease, and Diabetes, and Prostate cancer, . No Known Allergies   Review of Systems see hpi     Objective:   Physical Exam  Nursing note and vitals reviewed. Constitutional: He appears well-developed and well-nourished.          Assessment & Plan:

## 2012-07-13 NOTE — Assessment & Plan Note (Signed)
Improved. Finish PPI samples. ?sx's related to gleevac. Keep H/O appt

## 2012-07-28 ENCOUNTER — Ambulatory Visit (HOSPITAL_BASED_OUTPATIENT_CLINIC_OR_DEPARTMENT_OTHER): Payer: Self-pay | Admitting: Hematology & Oncology

## 2012-07-28 ENCOUNTER — Other Ambulatory Visit (HOSPITAL_BASED_OUTPATIENT_CLINIC_OR_DEPARTMENT_OTHER): Payer: Self-pay | Admitting: Lab

## 2012-07-28 VITALS — BP 134/74 | HR 67 | Temp 97.6°F | Resp 20 | Ht 66.75 in | Wt 203.0 lb

## 2012-07-28 DIAGNOSIS — C9210 Chronic myeloid leukemia, BCR/ABL-positive, not having achieved remission: Secondary | ICD-10-CM

## 2012-07-28 LAB — CBC WITH DIFFERENTIAL (CANCER CENTER ONLY)
BASO#: 0.1 10*3/uL (ref 0.0–0.2)
EOS%: 7.9 % — ABNORMAL HIGH (ref 0.0–7.0)
Eosinophils Absolute: 0.7 10*3/uL — ABNORMAL HIGH (ref 0.0–0.5)
HCT: 36.9 % — ABNORMAL LOW (ref 38.7–49.9)
HGB: 12.5 g/dL — ABNORMAL LOW (ref 13.0–17.1)
LYMPH#: 2.4 10*3/uL (ref 0.9–3.3)
MCH: 32.1 pg (ref 28.0–33.4)
MCHC: 33.9 g/dL (ref 32.0–35.9)
NEUT%: 53.8 % (ref 40.0–80.0)
RBC: 3.89 10*6/uL — ABNORMAL LOW (ref 4.20–5.70)

## 2012-07-28 LAB — COMPREHENSIVE METABOLIC PANEL
AST: 15 U/L (ref 0–37)
BUN: 14 mg/dL (ref 6–23)
CO2: 29 mEq/L (ref 19–32)
Calcium: 9.9 mg/dL (ref 8.4–10.5)
Chloride: 104 mEq/L (ref 96–112)
Creatinine, Ser: 1.26 mg/dL (ref 0.50–1.35)
Glucose, Bld: 92 mg/dL (ref 70–99)

## 2012-07-28 LAB — LACTATE DEHYDROGENASE: LDH: 124 U/L (ref 94–250)

## 2012-07-28 MED ORDER — PROCHLORPERAZINE MALEATE 10 MG PO TABS: 10.0000 mg | ORAL_TABLET | Freq: Four times a day (QID) | ORAL | Status: DC | PRN

## 2012-07-28 NOTE — Progress Notes (Signed)
This office note has been dictated.

## 2012-07-28 NOTE — Addendum Note (Signed)
Addended by: Arlan Organ R on: 07/28/2012 11:19 AM   Modules accepted: Orders

## 2012-07-29 NOTE — Progress Notes (Signed)
CC:   Marguarite Arbour, MD  DIAGNOSIS:  Chronic phase CML (chronic myelogenous leukemia)-MMR (major molecular remission).  CURRENT THERAPY:  [name] 400 mg p.o. daily (we will cut his dose in half to 200 mg daily secondary to toxicity).  INTERIM HISTORY:  Mr. Choi comes in for followup.  Unfortunately he is having problems with the [name].  He is having a lot of nausea.  He does not have any emesis but he feels like he should but cannot.  He said that when he was off [name], he felt really well.  Once he started the [name] he began to have problems.  He has had no cough.  He has had no leg swelling.  He has had an occasional headache.  He has had no bleeding, no bruising.  Again, he is in a major molecular remission.  We have confirmed that with our BCR-ABL assay.  PHYSICAL EXAMINATION:  General:  This is a well-developed, well- nourished white gentleman in no obvious distress.  Vital signs:  97.6, pulse 67, respiratory rate 18, blood pressure 134/74.  Weight is 203. Head and neck:  Shows a normocephalic, atraumatic skull.  There are no ocular or oral lesions.  There are no palpable cervical or supraclavicular lymph nodes.  Lungs:  Clear bilaterally.  Cardiac: Regular rate and rhythm with a normal S1, S2.  There are no murmurs, rubs or bruits.  Abdomen:  Soft with good bowel sounds.  There is no palpable abdominal mass.  There is no palpable hepatosplenomegaly. Extremities:  Shows no clubbing, cyanosis or edema.  Skin:  No rashes, ecchymoses or petechiae.  Neurological:  Shows no focal neurological deficits.  LABORATORY STUDIES:  White cell count is 8.9, hemoglobin 12.5, hematocrit 36.9, platelet count 262.  Peripheral smear shows good maturation of his white blood cells.  I see no immature myeloid cells. He has no blasts.  There are no nucleated red blood cells.  Platelets are adequate in number and size.  IMPRESSION:  Mr. Kostelnik is a 66 year old gentleman with chronic phase  CML (chronic myelogenous leukemia).  He is in a major molecular remission by his PCR assays.  We will go ahead and cut his [name] dose down to 200 mg.  It is certainly a possibility that his complaints are related to [name].  We will plan to get him back in another 6 weeks.  I will probably recheck his BCR-ABL assay when we see him back.    ______________________________ Josph Macho, M.D. PRE/MEDQ  D:  07/28/2012  T:  07/29/2012  Job:  [id number]

## 2012-08-03 ENCOUNTER — Telehealth: Payer: Self-pay | Admitting: Hematology & Oncology

## 2012-08-03 NOTE — Telephone Encounter (Signed)
Patient approved for financial assistance with a discount of 100% for a household of 3.  Discount expires 02/03/13.  Please apply to account balance.

## 2012-08-04 ENCOUNTER — Ambulatory Visit: Payer: Self-pay | Admitting: Hematology & Oncology

## 2012-08-04 ENCOUNTER — Other Ambulatory Visit: Payer: Self-pay | Admitting: Lab

## 2012-08-21 ENCOUNTER — Ambulatory Visit: Payer: Self-pay | Admitting: Internal Medicine

## 2012-08-31 DIAGNOSIS — C9590 Leukemia, unspecified not having achieved remission: Secondary | ICD-10-CM | POA: Insufficient documentation

## 2012-08-31 DIAGNOSIS — I2510 Atherosclerotic heart disease of native coronary artery without angina pectoris: Secondary | ICD-10-CM | POA: Insufficient documentation

## 2012-09-01 ENCOUNTER — Encounter (HOSPITAL_BASED_OUTPATIENT_CLINIC_OR_DEPARTMENT_OTHER): Payer: Self-pay | Admitting: *Deleted

## 2012-09-01 ENCOUNTER — Telehealth: Payer: Self-pay | Admitting: *Deleted

## 2012-09-01 ENCOUNTER — Emergency Department (HOSPITAL_BASED_OUTPATIENT_CLINIC_OR_DEPARTMENT_OTHER)
Admission: EM | Admit: 2012-09-01 | Discharge: 2012-09-01 | Disposition: A | Discharge status: Home / Self Care | Payer: Self-pay | Attending: Emergency Medicine | Admitting: Emergency Medicine

## 2012-09-01 DIAGNOSIS — Z856 Personal history of leukemia: Secondary | ICD-10-CM | POA: Insufficient documentation

## 2012-09-01 DIAGNOSIS — F17200 Nicotine dependence, unspecified, uncomplicated: Secondary | ICD-10-CM | POA: Insufficient documentation

## 2012-09-01 DIAGNOSIS — I4891 Unspecified atrial fibrillation: Secondary | ICD-10-CM | POA: Insufficient documentation

## 2012-09-01 DIAGNOSIS — I480 Paroxysmal atrial fibrillation: Secondary | ICD-10-CM

## 2012-09-01 DIAGNOSIS — I10 Essential (primary) hypertension: Secondary | ICD-10-CM | POA: Insufficient documentation

## 2012-09-01 DIAGNOSIS — E876 Hypokalemia: Secondary | ICD-10-CM

## 2012-09-01 DIAGNOSIS — E785 Hyperlipidemia, unspecified: Secondary | ICD-10-CM | POA: Insufficient documentation

## 2012-09-01 DIAGNOSIS — I2510 Atherosclerotic heart disease of native coronary artery without angina pectoris: Secondary | ICD-10-CM | POA: Insufficient documentation

## 2012-09-01 HISTORY — DX: Atherosclerotic heart disease of native coronary artery without angina pectoris: I25.10

## 2012-09-01 LAB — BASIC METABOLIC PANEL
CO2: 26 mEq/L (ref 19–32)
Chloride: 103 mEq/L (ref 96–112)
Glucose, Bld: 114 mg/dL — ABNORMAL HIGH (ref 70–99)
Potassium: 2.9 mEq/L — ABNORMAL LOW (ref 3.5–5.1)
Sodium: 142 mEq/L (ref 135–145)

## 2012-09-01 LAB — BASIC METABOLIC PANEL WITH GFR
BUN: 13 mg/dL (ref 6–23)
Calcium: 9.4 mg/dL (ref 8.4–10.5)
Creatinine, Ser: 1.2 mg/dL (ref 0.50–1.35)
GFR calc Af Amer: 72 mL/min — ABNORMAL LOW (ref >90)
GFR calc non Af Amer: 62 mL/min — ABNORMAL LOW (ref >90)

## 2012-09-01 LAB — CBC
HCT: 37.3 % — ABNORMAL LOW (ref 39.0–52.0)
Hemoglobin: 12.9 g/dL — ABNORMAL LOW (ref 13.0–17.0)
MCH: 32 pg (ref 26.0–34.0)
MCHC: 34.6 g/dL (ref 30.0–36.0)
MCV: 92.6 fL (ref 78.0–100.0)
Platelets: 276 10*3/uL (ref 150–400)
RBC: 4.03 MIL/uL — ABNORMAL LOW (ref 4.22–5.81)
RDW: 13.8 % (ref 11.5–15.5)
WBC: 10.8 10*3/uL — ABNORMAL HIGH (ref 4.0–10.5)

## 2012-09-01 MED ORDER — POTASSIUM CHLORIDE CRYS ER 20 MEQ PO TBCR
40.0000 meq | EXTENDED_RELEASE_TABLET | Freq: Once | ORAL | Status: AC
  Administered 2012-09-01: 40 meq via ORAL
  Filled 2012-09-01: qty 2

## 2012-09-01 MED ORDER — POTASSIUM CHLORIDE CRYS ER 20 MEQ PO TBCR: 20.0000 meq | EXTENDED_RELEASE_TABLET | Freq: Two times a day (BID) | ORAL | Status: DC

## 2012-09-01 NOTE — Telephone Encounter (Signed)
Called [facility] multiple phone numbers at least 6 times without success. Called pt and discussed directly with patient. States had preop EKG today for cataract surgery next week. Was told had "irregular heartbeat" and would need blood thinner and medication to slow his heart down. Was given a copy of EKG which he reads off as afib RVR vr ~107. States prior to discharge home was told heart rate was in 120's. Also states was told by opthomologist not to take blood thinner for his heart because "it would interfere with his cataract surgery" next week.   Discussed with pt new onset afib with rvr (107-120's) with h/o known CAD. Currently asx.  Recommend evaluation and unfortunately is now after hours for primary care and cardiology clinics. Recommend proceed to ED for further evaluation of possible new afib RVR. States understanding and agreement.

## 2012-09-01 NOTE — ED Notes (Signed)
Pt states eye surgery this am, EKG done  New onset a fib

## 2012-09-01 NOTE — Discharge Instructions (Signed)
Atrial Fibrillation Your caregiver has diagnosed you with atrial fibrillation (AFib). The heart normally beats very regularly; AFib is a type of irregular heartbeat. The heart rate may be faster or slower than normal. This can prevent your heart from pumping as well as it should. AFib can be constant (chronic) or intermittent (paroxysmal). CAUSES  Atrial fibrillation may be caused by:  Heart disease, including heart attack, coronary artery disease, heart failure, diseases of the heart valves, and others.   Blood clot in the lungs (pulmonary embolism).   Pneumonia or other infections.   Chronic lung disease.   Thyroid disease.   Toxins. These include alcohol, some medications (such as decongestant medications or diet pills), and caffeine.  In some people, no cause for AFib can be found. This is referred to as Lone Atrial Fibrillation. SYMPTOMS   Palpitations or a fluttering in your chest.   A vague sense of chest discomfort.   Shortness of breath.   Sudden onset of lightheadedness or weakness.  Sometimes, the first sign of AFib can be a complication of the condition. This could be a stroke or heart failure. DIAGNOSIS  Your description of your condition may make your caregiver suspicious of atrial fibrillation. Your caregiver will examine your pulse to determine if fibrillation is present. An EKG (electrocardiogram) will confirm the diagnosis. Further testing may help determine what caused you to have atrial fibrillation. This may include chest x-ray, echocardiogram, blood tests, or CT scans. PREVENTION  If you have previously had atrial fibrillation, your caregiver may advise you to avoid substances known to cause the condition (such as stimulant medications, and possibly caffeine or alcohol). You may be advised to use medications to prevent recurrence. Proper treatment of any underlying condition is important to help prevent recurrence. PROGNOSIS  Atrial fibrillation does tend to  become a chronic condition over time. It can cause significant complications (see below). Atrial fibrillation is not usually immediately life-threatening, but it can shorten your life expectancy. This seems to be worse in women. If you have lone atrial fibrillation and are under 60 years old, the risk of complications is very low, and life expectancy is not shortened. RISKS AND COMPLICATIONS  Complications of atrial fibrillation can include stroke, chest pain, and heart failure. Your caregiver will recommend treatments for the atrial fibrillation, as well as for any underlying conditions, to help minimize risk of complications. TREATMENT  Treatment for AFib is divided into several categories:  Treatment of any underlying condition.   Converting you out of AFib into a regular (sinus) rhythm.   Controlling rapid heart rate.   Prevention of blood clots and stroke.  Medications and procedures are available to convert your atrial fibrillation to sinus rhythm. However, recent studies have shown that this may not offer you any advantage, and cardiac experts are continuing research and debate on this topic. More important is controlling your rapid heartbeat. The rapid heartbeat causes more symptoms, and places strain on your heart. Your caregiver will advise you on the use of medications that can control your heart rate. Atrial fibrillation is a strong stroke risk. You can lessen this risk by taking blood thinning medications such as Coumadin (warfarin), or sometimes aspirin. These medications need close monitoring by your caregiver. Over-medication can cause bleeding. Too little medication may not protect against stroke. HOME CARE INSTRUCTIONS   If your caregiver prescribed medicine to make your heartbeat more normally, take as directed.   If blood thinners were prescribed by your caregiver, take EXACTLY as directed.     Perform blood tests EXACTLY as directed.   Quit smoking. Smoking increases your  cardiac and lung (pulmonary) risks.   DO NOT drink alcohol.   DO NOT drink caffeinated drinks (e.g. coffee, soda, chocolate, and leaf teas). You may drink decaffeinated coffee, soda or tea.   If you are overweight, you should choose a reduced calorie diet to lose weight. Please see a registered dietitian if you need more information about healthy weight loss. DO NOT USE DIET PILLS as they may aggravate heart problems.   If you have other heart problems that are causing AFib, you may need to eat a low salt, fat, and cholesterol diet. Your caregiver will tell you if this is necessary.   Exercise every day to improve your physical fitness. Stay active unless advised otherwise.   If your caregiver has given you a follow-up appointment, it is very important to keep that appointment. Not keeping the appointment could result in heart failure or stroke. If there is any problem keeping the appointment, you must call back to this facility for assistance.  SEEK MEDICAL CARE IF:  You notice a change in the rate, rhythm or strength of your heartbeat.   You develop an infection or any other change in your overall health status.  SEEK IMMEDIATE MEDICAL CARE IF:   You develop chest pain, abdominal pain, sweating, weakness or feel sick to your stomach (nausea).   You develop shortness of breath.   You develop swollen feet and ankles.   You develop dizziness, numbness, or weakness of your face or limbs, or any change in vision or speech.  MAKE SURE YOU:   Understand these instructions.   Will watch your condition.   Will get help right away if you are not doing well or get worse.  Document Released: 12/13/2005 Document Revised: 12/02/2011 Document Reviewed: 07/17/2008 [facility] Patient Information 2012 [place], [place].Hypokalemia Hypokalemia means a low potassium level in the blood.Potassium is an electrolyte that helps regulate the amount of fluid in the body. It also stimulates muscle contraction  and maintains a stable acid-base balance.Most of the body's potassium is inside of cells, and only a very small amount is in the blood. Because the amount in the blood is so small, minor changes can have big effects. PREPARATION FOR TEST Testing for potassium requires taking a blood sample taken by needle from a vein in the arm. The skin is cleaned thoroughly before the sample is drawn. There is no other special preparation needed. NORMAL VALUES Potassium levels below 3.5 mEq/L are abnormally low. Levels above 5.1 mEq/L are abnormally high. Ranges for normal findings may vary among different laboratories and hospitals. You should always check with your doctor after having lab work or other tests done to discuss the meaning of your test results and whether your values are considered within normal limits. MEANING OF TEST  Your caregiver will go over the test results with you and discuss the importance and meaning of your results, as well as treatment options and the need for additional tests, if necessary. A potassium level is frequently part of a routine medical exam. It is usually included as part of a whole "panel" of tests for several blood salts (such as Sodium and Chloride). It may be done as part of follow-up when a low potassium level was found in the past or other blood salts are suspected of being out of balance. A low potassium level might be suspected if you have one or more of the following:  Symptoms of weakness.   Abnormal heart rhythms.   High blood pressure and are taking medication to control this, especially water pills (diuretics).   Kidney disease that can affect your potassium level .   Diabetes requiring the use of insulin. The potassium may fall after taking insulin, especially if the diabetes had been out of control for a while.   A condition requiring the use of cortisone-type medication or certain types of antibiotics.   Vomiting and/or diarrhea for more than a day  or two.   A stomach or intestinal condition that may not permit appropriate absorption of potassium.   Fainting episodes.   Mental confusion.  OBTAINING TEST RESULTS It is your responsibility to obtain your test results. Ask the lab or department performing the test when and how you will get your results.  Please contact your caregiver directly if you have not received the results within one week. At that time, ask if there is anything different or new you should be doing in relation to the results. TREATMENT Hypokalemia can be treated with potassium supplements taken by mouth and/or adjustments in your current medications. A diet high in potassium is also helpful. Foods with high potassium content are:  Peas, lentils, lima beans, nuts, and dried fruit.   Whole grain and bran cereals and breads.   Fresh fruit, vegetables (bananas, cantaloupe, grapefruit, oranges, tomatoes, honeydew melons, potatoes).   Orange and tomato juices.   Meats. If potassium supplement has been prescribed for you today or your medications have been adjusted, see your personal caregiver in time02 for a re-check.  SEEK MEDICAL CARE IF:  There is a feeling of worsening weakness.   You experience repeated chest palpitations.   You are diabetic and having difficulty keeping your blood sugars in the normal range.   You are experiencing vomiting and/or diarrhea.   You are having difficulty with any of your regular medications.  SEEK IMMEDIATE MEDICAL CARE IF:  You experience chest pain, shortness of breath, or episodes of dizziness.   You have been having vomiting or diarrhea for more than 2 days.   You have a fainting episode.  MAKE SURE YOU:   Understand these instructions.   Will watch your condition.   Will get help right away if you are not doing well or get worse.  Document Released: 12/13/2005 Document Revised: 12/02/2011 Document Reviewed: 11/23/2008 [facility] Patient Information 2012  [place], [place].

## 2012-09-01 NOTE — Telephone Encounter (Signed)
Caller is w/[facility] and reports that patient had EKG done today that showed he was in AFib, believed to be new, asymptomatic w/rapid Ventircular rate. Patient is being prepped for procedure on Thurs, 09.12.13, and caller would like to speak with you RE: starting Coumadin & Rate Control for patient/SLS Will fax over latest records to [facility] w/labs, EKGs & Imaging at request of [facility] w/[facility]. [phone]]

## 2012-09-01 NOTE — ED Provider Notes (Signed)
History     CSN: [id number]  Arrival date & time 09/01/12  1740   First MD Initiated Contact with Patient 09/01/12 1752      Chief Complaint  Patient presents with  . Palpitations    (Consider location/radiation/quality/duration/timing/severity/associated sxs/prior treatment) HPI Comments: Pt reports was supposed to have cataract surgery at [facility] found to have irregular heart beat during eval today.  His PCP was contacted, has no prior h/o atrial fib apparently, was asked to come here by PCP to be further evaluated.  Pt has no SOB, no palpitations now, no CP.  He has h/o 2 stents, used to see cardiologist Dr. Aleen Campi who has since left, now only sees his PCP Dr. Rodena Medin.  Had a stress test 1 year ago, was ok at [facility].  No cough or cold symptoms, no other new medications.  He is on lisinopril for BP control.    The history is provided by the patient.    Past Medical History  Diagnosis Date  . CML (chronic myelocytic leukemia) 12/16/2011  . History of chicken pox   . Hypertension   . Hyperlipidemia   . History of kidney stones   . Coronary heart disease     Past Surgical History  Procedure Date  . Cholecystectomy 2003  . Coronary angioplasty with stent placement 2000    Family History  Problem Relation Age of Onset  . Arthritis Mother   . Hypertension Mother   . Hypertension Father   . Colon cancer Neg Hx   . Breast cancer Neg Hx   . Heart disease Neg Hx   . Rheumatic fever Mother   . Diabetes Neg Hx   . Prostate cancer Neg Hx     History  Substance Use Topics  . Smoking status: Current Some Day Smoker    Types: Cigarettes  . Smokeless tobacco: Never Used   Comment: 40 yr history-i/pdd  . Alcohol Use: Yes      Review of Systems  Constitutional: Negative for fever and chills.  Respiratory: Negative for chest tightness and shortness of breath.   Cardiovascular: Positive for palpitations. Negative for chest pain and leg swelling.  Gastrointestinal:  Negative for nausea, vomiting and diarrhea.  Neurological: Negative for dizziness, syncope, weakness, light-headedness and numbness.  All other systems reviewed and are negative.    Allergies  Review of patient's allergies indicates no known allergies.  Home Medications   Current Outpatient Rx  Name Route Sig Dispense Refill  . ACETAMINOPHEN 325 MG PO TABS Oral Take 650 mg by mouth every 6 (six) hours as needed. For headache.    . ASPIRIN 81 MG PO TABS Oral Take 81 mg by mouth daily.     . ATORVASTATIN CALCIUM 10 MG PO TABS Oral Take 10 mg by mouth daily.    Marland Kitchen VITAMIN D3 5000 UNITS PO CAPS Oral Take 5,000 mg by mouth every morning.    Marland Kitchen HYDROCHLOROTHIAZIDE 25 MG PO TABS Oral Take 1 tablet (25 mg total) by mouth daily. 30 tablet 6  . IMATINIB MESYLATE 400 MG PO TABS Oral Take 400 mg by mouth daily. Take with meals and large glass of water.Caution:Chemotherapy.     Marland Kitchen LISINOPRIL 40 MG PO TABS Oral Take 1 tablet (40 mg total) by mouth daily. 30 tablet 6  . MULTI-VITAMIN/MINERALS PO TABS Oral Take 1 tablet by mouth daily.      Marland Kitchen PROBIOTIC FORMULA PO Oral Take by mouth daily.    . DEXLANSOPRAZOLE 60 MG PO  CPDR Oral Take 1 capsule (60 mg total) by mouth daily. 15 capsule 0  . PROCHLORPERAZINE MALEATE 10 MG PO TABS Oral Take 1 tablet (10 mg total) by mouth every 6 (six) hours as needed. 60 tablet 4    BP 145/77  Pulse 87  Temp 98.2 F (36.8 C) (Oral)  Resp 16  Ht 5\' 8"  (1.727 m)  Wt 204 lb (92.534 kg)  BMI 31.02 kg/m2  SpO2 98%  Physical Exam  Nursing note and vitals reviewed. Constitutional: He is oriented to person, place, and time. He appears well-developed and well-nourished. No distress.  HENT:  Head: Normocephalic and atraumatic.  Eyes: Pupils are equal, round, and reactive to light.  Neck: Normal range of motion. Neck supple.  Cardiovascular: Normal rate and regular rhythm.   No murmur heard. Pulmonary/Chest: Effort normal. No respiratory distress.  Abdominal: Soft. He  exhibits no distension. There is no tenderness.  Neurological: He is alert and oriented to person, place, and time.  Skin: Skin is warm and dry. He is not diaphoretic.  Psychiatric: He has a normal mood and affect.    ED Course  Procedures (including critical care time)  Labs Reviewed  CBC - Abnormal; Notable for the following:    WBC 10.8 (*)     RBC 4.03 (*)     Hemoglobin 12.9 (*)     HCT 37.3 (*)     All other components within normal limits  BASIC METABOLIC PANEL - Abnormal; Notable for the following:    Potassium 2.9 (*)     Glucose, Bld 114 (*)     GFR calc non Af Amer 62 (*)     GFR calc Af Amer 72 (*)     All other components within normal limits   No results found.   1. Paroxysmal atrial fibrillation   2. Hypokalemia     RA sat is 98% and I interpret to be normal  ECG at time 17:58 shows NSR at rate 88, bifascicular block.  LAD.  No ST or T wave abn's.  compared to ECG from 07/03/12, rate is faster.    ECG from [facility] shows atrial fib at rate of 107, similar morphology.   7:06 PM Pt's K+ is 2.9.  Could be source of atrial fib.  Will give oral replacement for now and give RX.  Pt can follow up with Dr. Rodena Medin on Monday.  Return if worse.    MDM  Pt with atrial fib, paroxysmal.  No longer in atrial fib here.  If electrolytes are ok here, can follow up with Dr. Rodena Medin on Monday.          Gavin Pound. Oletta Lamas, MD 09/01/12 0981

## 2012-09-05 ENCOUNTER — Encounter: Payer: Self-pay | Admitting: Internal Medicine

## 2012-09-05 ENCOUNTER — Ambulatory Visit (INDEPENDENT_AMBULATORY_CARE_PROVIDER_SITE_OTHER): Payer: Self-pay | Admitting: Internal Medicine

## 2012-09-05 VITALS — BP 138/68 | HR 66 | Temp 98.0°F | Resp 16 | Wt 203.5 lb

## 2012-09-05 DIAGNOSIS — I4891 Unspecified atrial fibrillation: Secondary | ICD-10-CM

## 2012-09-05 DIAGNOSIS — E876 Hypokalemia: Secondary | ICD-10-CM

## 2012-09-05 LAB — BASIC METABOLIC PANEL
BUN: 16 mg/dL (ref 6–23)
Calcium: 9.6 mg/dL (ref 8.4–10.5)
Creat: 1.16 mg/dL (ref 0.50–1.35)

## 2012-09-06 ENCOUNTER — Ambulatory Visit (HOSPITAL_BASED_OUTPATIENT_CLINIC_OR_DEPARTMENT_OTHER): Payer: Self-pay | Admitting: Hematology & Oncology

## 2012-09-06 ENCOUNTER — Other Ambulatory Visit (HOSPITAL_BASED_OUTPATIENT_CLINIC_OR_DEPARTMENT_OTHER): Payer: Self-pay | Admitting: Lab

## 2012-09-06 ENCOUNTER — Telehealth: Payer: Self-pay | Admitting: Internal Medicine

## 2012-09-06 VITALS — BP 149/71 | HR 70 | Temp 98.0°F | Resp 20 | Ht 68.0 in | Wt 203.0 lb

## 2012-09-06 DIAGNOSIS — C9210 Chronic myeloid leukemia, BCR/ABL-positive, not having achieved remission: Secondary | ICD-10-CM

## 2012-09-06 LAB — CBC WITH DIFFERENTIAL (CANCER CENTER ONLY)
BASO%: 0.6 % (ref 0.0–2.0)
Eosinophils Absolute: 0.5 10*3/uL (ref 0.0–0.5)
LYMPH%: 29.2 % (ref 14.0–48.0)
MONO#: 0.7 10*3/uL (ref 0.1–0.9)
MONO%: 7.1 % (ref 0.0–13.0)
NEUT#: 5.6 10*3/uL (ref 1.5–6.5)
Platelets: 237 10*3/uL (ref 145–400)
RBC: 3.93 10*6/uL — ABNORMAL LOW (ref 4.20–5.70)
RDW: 13.8 % (ref 11.1–15.7)
WBC: 9.6 10*3/uL (ref 4.0–10.0)

## 2012-09-06 LAB — COMPREHENSIVE METABOLIC PANEL
ALT: 16 U/L (ref 0–53)
Albumin: 4.5 g/dL (ref 3.5–5.2)
Alkaline Phosphatase: 60 U/L (ref 39–117)
Glucose, Bld: 108 mg/dL — ABNORMAL HIGH (ref 70–99)
Potassium: 3.5 mEq/L (ref 3.5–5.3)
Sodium: 143 mEq/L (ref 135–145)
Total Bilirubin: 0.3 mg/dL (ref 0.3–1.2)
Total Protein: 6.9 g/dL (ref 6.0–8.3)

## 2012-09-06 MED ORDER — HYDROCHLOROTHIAZIDE 25 MG PO TABS: 25.0000 mg | ORAL_TABLET | Freq: Every day | ORAL | Status: DC

## 2012-09-06 NOTE — Telephone Encounter (Signed)
Rx done/SLS 

## 2012-09-06 NOTE — Patient Instructions (Signed)
Cal with problems

## 2012-09-06 NOTE — Telephone Encounter (Signed)
Refill- hydrochlorothiazide 25mg  tablets. Take one tablet by mouth daily. Qty 30 last fill 8.13.13

## 2012-09-06 NOTE — Progress Notes (Signed)
This office note has been dictated.

## 2012-09-07 NOTE — Progress Notes (Signed)
CC:   Marguarite Arbour, MD [name]. [name], MD, [facility]  DIAGNOSIS:  Chronic-phase CML.  CURRENT THERAPY:  [name] 200 mg p.o. daily.  INTERIM HISTORY:  Mr. Machnik comes in for followup.  We saw him a month ago.  He is doing better than when we last saw him.  We have cut his [name] dose back.  He was in a major molecular remission, so I felt we could probably cut his dose back for toxicity issues.  He is going for a right cataract removal tomorrow.  He apparently was found have paroxysmal atrial fibrillation.  He is going to see a cardiologist here, Dr. [name], I think in a couple of weeks.  He has had additional EKGs after the 1 done at [facility].  Each EKG did not show any evidence of atrial fibrillation.  He feels okay.  He has not noticed any palpitations.  He has had no shortness of breath.  He has had no nausea.  There has been no diarrhea. He has had no rashes.  He has had no leg swelling.  PHYSICAL EXAMINATION:  This is a well-developed, well-nourished white gentleman in no obvious distress.  vital signs:  Temperature of 98, pulse 70, respiratory rate 20, blood pressure 149/71.  Weight is 203. Head and neck:  Normocephalic, atraumatic skull.  There are no ocular or oral lesions.  There are no palpable cervical or supraclavicular lymph nodes.  Lungs:  Clear bilaterally.  He has no rales, wheezes or rhonchi. Cardiac:  Regular rate and rhythm with a normal S1 and S2.  There are no murmurs, rubs or bruits.  Abdomen:  Soft with good bowel sounds.  There is no palpable abdominal mass.  There is no fluid wave.  There is no palpable hepatosplenomegaly.  Back:  No tenderness over the spine, ribs, or hips.  Extremities:  No clubbing, cyanosis or edema.  Neurologic:  No focal neurological deficits.  LABORATORY STUDIES:  White cell count 9.6, hemoglobin 12.6, hematocrit 37.7, platelet count 237.  Peripheral smear shows good maturation of his white blood cells.  I see no immature  myeloid cells.  There are no hypersegmented polys.  He has no nucleated red blood cells.  Platelets are adequate in number and size.  IMPRESSION:  Mr. Rasmussen is a 66 year old gentleman with chronic-phase CML.  He is doing quite well.  He we will continue him on the 200 mg dose.  He is tolerating this well.  I will see him back in 2 months.  We will check his BCR-ABL ratio at that point in time.  I do not see any problems with him having his cataract surgery from a hematologic point of view.    ______________________________ Josph Macho, M.D. PRE/MEDQ  D:  09/06/2012  T:  09/07/2012  Job:  [id number]

## 2012-09-08 DIAGNOSIS — Z961 Presence of intraocular lens: Secondary | ICD-10-CM | POA: Insufficient documentation

## 2012-09-08 DIAGNOSIS — H1820 Unspecified corneal edema: Secondary | ICD-10-CM | POA: Insufficient documentation

## 2012-09-09 DIAGNOSIS — E876 Hypokalemia: Secondary | ICD-10-CM | POA: Insufficient documentation

## 2012-09-09 NOTE — Assessment & Plan Note (Addendum)
EKG obtained demonstrates sinus bradycardia rate of fifty-five with underlying right bundle branch block. Asymptomatic. Consider PAF. Cardiology consult given history of underlying CAD. Obtain TSH.

## 2012-09-09 NOTE — Assessment & Plan Note (Signed)
Obtain Chem-7 

## 2012-09-09 NOTE — Progress Notes (Signed)
  Subjective:    Patient ID: Jason [name], male    DOB: 31-Oct-1946, 66 y.o.   MRN: [id number]  HPI patient presents to clinic for evaluation of abnormal EKG. Last Friday received message from [facility] preop clinic the patient was in atrial fibrillation with RVR. Question whether patient could be called in anticoagulation and or rate limiting medication. Apparently patient was DC'd home. Multiple attempts to contact the clinic unsuccessful. Contacted the patient at that time who was asymptomatic without palpitations chest pain shortness of breath or neurologic compromise. States he was obtaining EKG for cataract surgery next week. States he was told by the ophthalmologist not to receive anticoagulation as this would interfere with his upcoming surgery. At this time it was after hours and clinics are closed the patient was directed to the emergency department for further evaluation. Evaluation there revealed normal sinus rhythm. Patient does have known history of CAD. Was however noted be hypokalemic with a potassium of 2.9. Presents today without complaint and denies chest pain chest pressure shortness of breath palpitations or neurologic deficit.  Past Medical History  Diagnosis Date  . CML (chronic myelocytic leukemia) 12/16/2011  . History of chicken pox   . Hypertension   . Hyperlipidemia   . History of kidney stones   . Coronary heart disease    Past Surgical History  Procedure Date  . Cholecystectomy 2003  . Coronary angioplasty with stent placement 2000    reports that he has been smoking Cigarettes.  He has never used smokeless tobacco. He reports that he drinks alcohol. He reports that he does not use illicit drugs. family history includes Arthritis in his mother; Hypertension in his father and mother; and Rheumatic fever in his mother.  There is no history of Colon cancer, and Breast cancer, and Heart disease, and Diabetes, and Prostate cancer, . No Known Allergies   Review  of Systems see history of present illness     Objective:   Physical Exam  Constitutional: He appears well-developed and well-nourished. No distress.  HENT:  Head: Normocephalic and atraumatic.  Eyes: Conjunctivae normal are normal. No scleral icterus.  Neck: No JVD present.  Cardiovascular: Normal rate, regular rhythm and normal heart sounds.   Pulmonary/Chest: Effort normal and breath sounds normal. No respiratory distress. He has no wheezes. He has no rales.  Neurological: He is alert.  Skin: He is not diaphoretic.  Psychiatric: He has a normal mood and affect.          Assessment & Plan:

## 2012-09-20 ENCOUNTER — Ambulatory Visit (INDEPENDENT_AMBULATORY_CARE_PROVIDER_SITE_OTHER): Payer: Self-pay | Admitting: Cardiology

## 2012-09-20 ENCOUNTER — Encounter: Payer: Self-pay | Admitting: Cardiology

## 2012-09-20 VITALS — BP 140/84 | HR 69 | Ht 68.0 in | Wt 203.0 lb

## 2012-09-20 DIAGNOSIS — E785 Hyperlipidemia, unspecified: Secondary | ICD-10-CM

## 2012-09-20 DIAGNOSIS — F17200 Nicotine dependence, unspecified, uncomplicated: Secondary | ICD-10-CM

## 2012-09-20 DIAGNOSIS — I2510 Atherosclerotic heart disease of native coronary artery without angina pectoris: Secondary | ICD-10-CM

## 2012-09-20 DIAGNOSIS — I4891 Unspecified atrial fibrillation: Secondary | ICD-10-CM

## 2012-09-20 DIAGNOSIS — I10 Essential (primary) hypertension: Secondary | ICD-10-CM

## 2012-09-20 DIAGNOSIS — Z720 Tobacco use: Secondary | ICD-10-CM

## 2012-09-20 MED ORDER — RIVAROXABAN 20 MG PO TABS: 20.0000 mg | ORAL_TABLET | Freq: Every day | ORAL | Status: DC

## 2012-09-20 MED ORDER — METOPROLOL SUCCINATE ER 25 MG PO TB24: 25.0000 mg | ORAL_TABLET | Freq: Every day | ORAL | Status: DC

## 2012-09-20 NOTE — Assessment & Plan Note (Signed)
Patient counseled on discontinuing. 

## 2012-09-20 NOTE — Patient Instructions (Addendum)
Your physician recommends that you schedule a follow-up appointment in: 3 MONTHS WITH DR CRENSHAW IN [place]  STOP ASPIRIN  START XARELTO 20 MG ONCE DAILY  Your physician has requested that you have an echocardiogram. Echocardiography is a painless test that uses sound waves to create images of your heart. It provides your doctor with information about the size and shape of your heart and how well your heart's chambers and valves are working. This procedure takes approximately one hour. There are no restrictions for this procedure.AT [facility] OFFICE  Your physician recommends that you return for lab work in: ONE MONTH IN THE [place] OFFICE  START METOPROLOL SUCC 25 MG ONCE DAILY WITH FOOD

## 2012-09-20 NOTE — Assessment & Plan Note (Signed)
Continue statin. 

## 2012-09-20 NOTE — Assessment & Plan Note (Signed)
Blood pressure borderline. Add Toprol for pressure and for rate control if atrial fibrillation recurs.

## 2012-09-20 NOTE — Assessment & Plan Note (Addendum)
Patient is in sinus rhythm by electrocardiogram today. I have reviewed his previous electrocardiogram which did show atrial fibrillation with a mildly elevated ventricular response. We will begin Toprol 25 mg daily for rate control in case his atrial fibrillation recurs. Recent TSH normal. Schedule echocardiogram to quantify LV function. Embolic risk factors of hypertension, previous coronary disease and there is note of small lacunar infarcts on previous MRI. I therefore feel anticoagulation is indicated. Discontinue aspirin. Begin xeralto 20 mg daily. Note renal function reasonable on recent lab work. Repeat CBC, BUN and creatinine in one month. Note I reviewed the patient with Dr. Myna Hidalgo. No contraindication to xeralto for use of [name] or CML.

## 2012-09-20 NOTE — Progress Notes (Signed)
HPI: 66 year old male with past medical history of coronary artery disease for evaluation of atrial fibrillation. Patient apparently had stents placed in at [facility] in 2000. Records are not available. His last stress test was approximately one to one and a half years ago following admission for atypical chest pain. Apparently stress test was negative. Patient seen preoperatively at [facility] on September 6 and electrocardiogram showed atrial fibrillation. Cardiology is asked to evaluate. He has dyspnea with more extreme activities but not routine activities. No orthopnea, PND, pedal edema, palpitations, syncope or exertional chest pain. No history of bleeding. Note patient had an MRI in July of 2013. There were scattered numerous but small foci throughout the cerebral white matter. There was evidence of tiny chronic lacunar infarcts in the inferior left cerebellum.  Current Outpatient Prescriptions  Medication Sig Dispense Refill  . acetaminophen (TYLENOL) 325 MG tablet Take 650 mg by mouth every 6 (six) hours as needed. For headache.      Marland Kitchen aspirin 81 MG tablet Take 81 mg by mouth daily.       Marland Kitchen atorvastatin (LIPITOR) 10 MG tablet Take 10 mg by mouth daily.      . Cholecalciferol (VITAMIN D3) 5000 UNITS CAPS Take 5,000 mg by mouth every morning.      Marland Kitchen dexlansoprazole (DEXILANT) 60 MG capsule Take 1 capsule (60 mg total) by mouth daily.  15 capsule  0  . hydrochlorothiazide (HYDRODIURIL) 25 MG tablet Take 1 tablet (25 mg total) by mouth daily.  30 tablet  6  . imatinib ([name]) 400 MG tablet Take 400 mg by mouth daily. Take with meals and large glass of water.Caution:Chemotherapy.  09-06-12 pt states he was told to take 1/2 pill. 200 mg.      . lisinopril (PRINIVIL,ZESTRIL) 40 MG tablet Take 1 tablet (40 mg total) by mouth daily.  30 tablet  6  . Multiple Vitamins-Minerals (MULTIVITAMIN WITH MINERALS) tablet Take 1 tablet by mouth daily.        . potassium chloride SA  (K-DUR,KLOR-CON) 20 MEQ tablet Take 20 mEq by mouth daily.      . Probiotic Product (PROBIOTIC FORMULA PO) Take by mouth daily.      . prochlorperazine (COMPAZINE) 10 MG tablet Take 10 mg by mouth every 6 (six) hours as needed.      Marland Kitchen DISCONTD: potassium chloride SA (K-DUR,KLOR-CON) 20 MEQ tablet Take 1 tablet (20 mEq total) by mouth 2 (two) times daily.  10 tablet  0    No Known Allergies  Past Medical History  Diagnosis Date  . CML (chronic myelocytic leukemia) 12/16/2011  . History of chicken pox   . Hypertension   . Hyperlipidemia   . History of kidney stones   . Coronary heart disease   . Atrial fibrillation     Past Surgical History  Procedure Date  . Cholecystectomy 2003  . Coronary angioplasty with stent placement 2000    History   Social History  . Marital Status: Married    Spouse Name: N/A    Number of Children: 3  . Years of Education: N/A   Occupational History  .      Retired   Social History Main Topics  . Smoking status: Current Some Day Smoker    Types: Cigarettes  . Smokeless tobacco: Never Used   Comment: 40 yr history-i/pdd  . Alcohol Use: Yes     Rare  . Drug Use: No  . Sexually Active: Not on file  Other Topics Concern  . Not on file   Social History Narrative  . No narrative on file    Family History  Problem Relation Age of Onset  . Arthritis Mother   . Hypertension Mother   . Hypertension Father   . Colon cancer Neg Hx   . Breast cancer Neg Hx   . Heart disease Mother     Rheumatic fever  . Rheumatic fever Mother   . Diabetes Neg Hx   . Prostate cancer Neg Hx     ROS: no fevers or chills, productive cough, hemoptysis, dysphasia, odynophagia, melena, hematochezia, dysuria, hematuria, rash, seizure activity, orthopnea, PND, pedal edema, claudication. Remaining systems are negative.  Physical Exam:   Blood pressure 140/84, pulse 69, height 5\' 8"  (1.727 m), weight 203 lb (92.08 kg).  General:  Well developed/well  nourished in NAD Skin warm/dry Patient not depressed No peripheral clubbing Back-normal HEENT-normal/normal eyelids Neck supple/normal carotid upstroke bilaterally; no bruits; no JVD; no thyromegaly chest - CTA/ normal expansion CV - RRR/normal S1 and S2; no murmurs, rubs or gallops;  PMI nondisplaced Abdomen -NT/ND, no HSM, no mass, + bowel sounds, no bruit 2+ femoral pulses, no bruits Ext-no edema, chords, 2+ DP Neuro-grossly nonfocal  ECG sinus rhythm at a rate of 69. Occasional PVC. Right bundle branch block. Left anterior fascicular block. Left ventricular hypertrophy. No ST changes.

## 2012-09-20 NOTE — Assessment & Plan Note (Signed)
Discontinue aspirin given need for xeralto. Continue statin. Obtain records from [facility] concerning previous stents and most recent functional study. Patient is not presently having chest pain.

## 2012-09-22 ENCOUNTER — Ambulatory Visit: Payer: Self-pay | Admitting: Internal Medicine

## 2012-09-27 ENCOUNTER — Telehealth: Payer: Self-pay | Admitting: Cardiology

## 2012-09-27 ENCOUNTER — Other Ambulatory Visit: Payer: Self-pay | Admitting: *Deleted

## 2012-09-27 DIAGNOSIS — C9210 Chronic myeloid leukemia, BCR/ABL-positive, not having achieved remission: Secondary | ICD-10-CM

## 2012-09-27 MED ORDER — IMATINIB MESYLATE 100 MG PO TABS: 200.0000 mg | ORAL_TABLET | Freq: Every day | ORAL | Status: DC

## 2012-09-27 NOTE — Telephone Encounter (Signed)
plz return call to patient [phone] regarding medication

## 2012-09-27 NOTE — Telephone Encounter (Signed)
Spoke with pt wife, they need samples of xarelto if available.

## 2012-09-28 ENCOUNTER — Encounter: Payer: Self-pay | Admitting: Cardiology

## 2012-09-28 NOTE — Telephone Encounter (Signed)
Samples placed at the front desk for pickup. 

## 2012-10-02 ENCOUNTER — Ambulatory Visit (HOSPITAL_COMMUNITY): Payer: Self-pay | Attending: Cardiology

## 2012-10-02 DIAGNOSIS — I369 Nonrheumatic tricuspid valve disorder, unspecified: Secondary | ICD-10-CM | POA: Insufficient documentation

## 2012-10-02 DIAGNOSIS — F17200 Nicotine dependence, unspecified, uncomplicated: Secondary | ICD-10-CM | POA: Insufficient documentation

## 2012-10-02 DIAGNOSIS — I359 Nonrheumatic aortic valve disorder, unspecified: Secondary | ICD-10-CM | POA: Insufficient documentation

## 2012-10-02 DIAGNOSIS — I2510 Atherosclerotic heart disease of native coronary artery without angina pectoris: Secondary | ICD-10-CM | POA: Insufficient documentation

## 2012-10-02 DIAGNOSIS — I10 Essential (primary) hypertension: Secondary | ICD-10-CM | POA: Insufficient documentation

## 2012-10-02 DIAGNOSIS — I4891 Unspecified atrial fibrillation: Secondary | ICD-10-CM | POA: Insufficient documentation

## 2012-10-02 NOTE — Progress Notes (Signed)
Echocardiogram performed.  

## 2012-10-31 ENCOUNTER — Telehealth: Payer: Self-pay | Admitting: Cardiology

## 2012-10-31 NOTE — Telephone Encounter (Signed)
plz return call to patient wife [phone] regarding alternative med for Xarelto which is too expensive for patient.

## 2012-10-31 NOTE — Telephone Encounter (Signed)
Spoke with pt wife, they are unable to afford the xarelto. Will place samples at the front desk for pick up. Will check into indigent information for the pt.

## 2012-11-01 ENCOUNTER — Other Ambulatory Visit (HOSPITAL_BASED_OUTPATIENT_CLINIC_OR_DEPARTMENT_OTHER): Payer: Self-pay | Admitting: Lab

## 2012-11-01 ENCOUNTER — Ambulatory Visit (HOSPITAL_BASED_OUTPATIENT_CLINIC_OR_DEPARTMENT_OTHER): Payer: Self-pay | Admitting: Hematology & Oncology

## 2012-11-01 VITALS — BP 134/59 | HR 72 | Temp 98.2°F | Resp 18 | Ht 68.0 in | Wt 206.0 lb

## 2012-11-01 DIAGNOSIS — C9211 Chronic myeloid leukemia, BCR/ABL-positive, in remission: Secondary | ICD-10-CM

## 2012-11-01 DIAGNOSIS — C9210 Chronic myeloid leukemia, BCR/ABL-positive, not having achieved remission: Secondary | ICD-10-CM

## 2012-11-01 LAB — CBC WITH DIFFERENTIAL (CANCER CENTER ONLY)
BASO%: 1 % (ref 0.0–2.0)
EOS%: 5.2 % (ref 0.0–7.0)
HCT: 39.4 % (ref 38.7–49.9)
LYMPH%: 27.8 % (ref 14.0–48.0)
MCH: 30.9 pg (ref 28.0–33.4)
MCHC: 33.5 g/dL (ref 32.0–35.9)
MCV: 92 fL (ref 82–98)
MONO%: 8.1 % (ref 0.0–13.0)
NEUT%: 57.9 % (ref 40.0–80.0)
Platelets: 261 10*3/uL (ref 145–400)
RDW: 13.4 % (ref 11.1–15.7)
WBC: 10.3 10*3/uL — ABNORMAL HIGH (ref 4.0–10.0)

## 2012-11-01 LAB — CMP (CANCER CENTER ONLY)
ALT(SGPT): 8 U/L — ABNORMAL LOW (ref 10–47)
AST: 20 U/L (ref 11–38)
Alkaline Phosphatase: 64 U/L (ref 26–84)
BUN, Bld: 12 mg/dL (ref 7–22)
Calcium: 9.3 mg/dL (ref 8.0–10.3)
Chloride: 104 mEq/L (ref 98–108)
Creat: 1.2 mg/dl (ref 0.6–1.2)
Total Bilirubin: 0.4 mg/dl (ref 0.20–1.60)

## 2012-11-01 LAB — CHCC SATELLITE - SMEAR

## 2012-11-01 NOTE — Progress Notes (Signed)
This office note has been dictated.

## 2012-11-02 NOTE — Progress Notes (Signed)
CC:   [name]. [name], MD, [facility]  DIAGNOSIS:  Chronic phase chronic myelogenous leukemia.  CURRENT THERAPY:  [name] 200 mg p.o. daily.  INTERIM HISTORY:  Mr. Jason [name] comes in for followup.  He is really doing well.  He has really had no complaints with respect to the [name].  He had cataract surgery and that went well for his right eye.  He has paroxysmal acute fibrillation.  He saw Dr. [name].  Dr. [name] put him on Xarelto.  He has not had any problems with the Xarelto.  There has been no bleeding.  He has had no change in bowel or bladder habits.  He has had some slight constipation.  He has not noted any leg swelling.  He has had no rashes.  We did send off a BCR-ABL analysis today.  PHYSICAL EXAMINATION:  This is a well-developed, well-nourished white gentleman in no obvious distress.  Vital Signs:  Temperature of 98.2, pulse 72, respiratory rate 18, blood pressure 134/59, weight is 206. Head and Neck:  He has a normocephalic, atraumatic skull.  There are no ocular or oral lesions.  There are no palpable cervical or supraclavicular lymph nodes.  Lungs:  Clear bilaterally.  Cardiac: Regular rate and rhythm with a normal S1 and S2.  There are no murmurs, rubs or bruits.  Abdomen:  Soft with good bowel sounds.  There is no palpable abdominal mass.  There is no fluid wave.  There is no palpable hepatosplenomegaly.  Back:  No tenderness over the spine, ribs, or hips. Extremities:  No clubbing, cyanosis or edema.  Neurological:  No focal neurological deficits.  LABORATORY STUDIES:  White cell count is 10.3, hemoglobin 13.2, hematocrit 39.4, platelet count 261.  Sodium 143, potassium 3.0, BUN 12, creatinine 1.2.  IMPRESSION:  Jason [name] is a 66 year old gentleman with chronic phase chronic myelogenous leukemia.  We will see how he is doing today with his BCR-ABL analysis.  I have to believe that he is still in remission.  I did look at his blood smear.  I did not see  anything that suggested relapse.  He does have the other health problems.  He is on quite a few medications.  We will plan to get him back in 3 months' time now.  I do not think we need to see him any sooner or have blood work done in between visits.    ______________________________ Josph Macho, M.D. PRE/MEDQ  D:  11/01/2012  T:  11/02/2012  Job:  [id number]

## 2012-11-22 ENCOUNTER — Telehealth: Payer: Self-pay | Admitting: Internal Medicine

## 2012-11-22 DIAGNOSIS — C9210 Chronic myeloid leukemia, BCR/ABL-positive, not having achieved remission: Secondary | ICD-10-CM

## 2012-11-22 MED ORDER — LISINOPRIL 40 MG PO TABS: 40.0000 mg | ORAL_TABLET | Freq: Every day | ORAL | Status: DC

## 2012-11-22 NOTE — Telephone Encounter (Signed)
Refill- lisinopril 40mg  tablets. Take one tablet by mouth daily. Qty 30 last fill 10.26.13

## 2012-11-22 NOTE — Telephone Encounter (Signed)
Rx to pharmacy/SL[facility] 

## 2012-12-01 ENCOUNTER — Telehealth: Payer: Self-pay | Admitting: Cardiology

## 2012-12-01 NOTE — Telephone Encounter (Signed)
New problem:  xarelto  20 mg.    [facility] [phone].

## 2012-12-04 MED ORDER — RIVAROXABAN 20 MG PO TABS: 20.0000 mg | ORAL_TABLET | Freq: Every day | ORAL | Status: DC

## 2012-12-06 ENCOUNTER — Encounter: Payer: Self-pay | Admitting: Cardiology

## 2012-12-06 ENCOUNTER — Ambulatory Visit (INDEPENDENT_AMBULATORY_CARE_PROVIDER_SITE_OTHER): Payer: Self-pay | Admitting: Cardiology

## 2012-12-06 VITALS — BP 140/60 | HR 53 | Ht 68.0 in | Wt 208.0 lb

## 2012-12-06 DIAGNOSIS — I2510 Atherosclerotic heart disease of native coronary artery without angina pectoris: Secondary | ICD-10-CM

## 2012-12-06 DIAGNOSIS — E785 Hyperlipidemia, unspecified: Secondary | ICD-10-CM

## 2012-12-06 DIAGNOSIS — Z720 Tobacco use: Secondary | ICD-10-CM

## 2012-12-06 DIAGNOSIS — I4891 Unspecified atrial fibrillation: Secondary | ICD-10-CM

## 2012-12-06 DIAGNOSIS — I10 Essential (primary) hypertension: Secondary | ICD-10-CM

## 2012-12-06 DIAGNOSIS — F17200 Nicotine dependence, unspecified, uncomplicated: Secondary | ICD-10-CM

## 2012-12-06 NOTE — Assessment & Plan Note (Signed)
Continue statin. 

## 2012-12-06 NOTE — Assessment & Plan Note (Signed)
Continue present blood pressure medications. 

## 2012-12-06 NOTE — Progress Notes (Signed)
HPI: Pleasant male for fu of coronary artery disease and atrial fibrillation. Patient apparently had stents placed in at [facility] in 2000. Records are not available. His last stress test was approximately one to one and a half years ago following admission for atypical chest pain. Apparently stress test was negative. Patient seen preoperatively at [facility] on September 01 2012 and electrocardiogram showed atrial fibrillation. Note patient had an MRI in July of 2013. There were scattered numerous but small foci throughout the cerebral white matter. There was evidence of tiny chronic lacunar infarcts in the inferior left cerebellum. Echocardiogram in October of 2013 showed normal LV function and mild aortic insufficiency. TSH in September of 2013 was 1.612. Since I last saw him, the patient has dyspnea with more extreme activities but not with routine activities. It is relieved with rest. It is not associated with chest pain. There is no orthopnea, PND or pedal edema. There is no syncope or palpitations. There is no exertional chest pain.    Current Outpatient Prescriptions  Medication Sig Dispense Refill  . acetaminophen (TYLENOL) 325 MG tablet Take 650 mg by mouth every 6 (six) hours as needed. For headache.      Marland Kitchen atorvastatin (LIPITOR) 10 MG tablet Take 10 mg by mouth daily.      . Cholecalciferol (VITAMIN D3) 5000 UNITS CAPS Take 5,000 mg by mouth every morning.      . hydrochlorothiazide (HYDRODIURIL) 25 MG tablet Take 1 tablet (25 mg total) by mouth daily.  30 tablet  6  . imatinib ([name]) 100 MG tablet Take 2 tablets (200 mg total) by mouth daily. Take with meals and large glass of water.Caution:Chemotherapy.  30 tablet  6  . lisinopril (PRINIVIL,ZESTRIL) 40 MG tablet Take 1 tablet (40 mg total) by mouth daily.  30 tablet  6  . metoprolol succinate (TOPROL XL) 25 MG 24 hr tablet Take 1 tablet (25 mg total) by mouth daily.  30 tablet  11  . Multiple Vitamins-Minerals  (MULTIVITAMIN WITH MINERALS) tablet Take 1 tablet by mouth daily.        . potassium chloride SA (K-DUR,KLOR-CON) 20 MEQ tablet Take 20 mEq by mouth daily.      . Probiotic Product (PROBIOTIC FORMULA PO) Take by mouth daily.      . prochlorperazine (COMPAZINE) 10 MG tablet Take 10 mg by mouth every 6 (six) hours as needed.      . Rivaroxaban (XARELTO) 20 MG TABS Take 1 tablet (20 mg total) by mouth daily.  30 tablet  6  . UNKNOWN TO PATIENT 11-01-12  PATIENT GIVEN COPY OF MEDICATION LIST, TO REVIEW WITH HOME MEDICATIONS.      Marland Kitchen dexlansoprazole (DEXILANT) 60 MG capsule Take 1 capsule (60 mg total) by mouth daily.  15 capsule  0     Past Medical History  Diagnosis Date  . CML (chronic myelocytic leukemia) 12/16/2011  . History of chicken pox   . Hypertension   . Hyperlipidemia   . History of kidney stones   . Coronary heart disease   . Atrial fibrillation     Past Surgical History  Procedure Date  . Cholecystectomy 2003  . Coronary angioplasty with stent placement 2000    History   Social History  . Marital Status: Married    Spouse Name: N/A    Number of Children: 3  . Years of Education: N/A   Occupational History  .      Retired   Social History  Main Topics  . Smoking status: Current Some Day Smoker    Types: Cigarettes  . Smokeless tobacco: Never Used     Comment: 40 yr history-i/pdd  . Alcohol Use: Yes     Comment: Rare  . Drug Use: No  . Sexually Active: Not on file   Other Topics Concern  . Not on file   Social History Narrative  . No narrative on file    ROS: no fevers or chills, productive cough, hemoptysis, dysphasia, odynophagia, melena, hematochezia, dysuria, hematuria, rash, seizure activity, orthopnea, PND, pedal edema, claudication. Remaining systems are negative.  Physical Exam: Well-developed well-nourished in no acute distress.  Skin is warm and dry.  HEENT is normal.  Neck is supple.  Chest is clear to auscultation with normal expansion.    Cardiovascular exam is regular rate and rhythm.  Abdominal exam nontender or distended. No masses palpated. Extremities show no edema. neuro grossly intact  ECG sinus rhythm at a rate of 53. Right bundle branch block. Left ventricular hypertrophy. Left axis deviation. Occasional PVC.

## 2012-12-06 NOTE — Assessment & Plan Note (Signed)
Patient remains in sinus rhythm. Continue Toprol. Continue Xeralto. Recent hemoglobin and renal function normal.

## 2012-12-06 NOTE — Assessment & Plan Note (Signed)
Not on aspirin given need for anticoagulation with atrial fibrillation. Continue statin. Plan functional study in 6 months when he returns.

## 2012-12-06 NOTE — Patient Instructions (Addendum)
Your physician wants you to follow-up in: 6 MONTHS WITH DR CRENSHAW IN [place] You will receive a reminder letter in the mail two months in advance. If you don't receive a letter, please call our office to schedule the follow-up appointment.  

## 2012-12-06 NOTE — Assessment & Plan Note (Signed)
Patient counseled on discontinuing. 

## 2013-02-01 ENCOUNTER — Ambulatory Visit (HOSPITAL_BASED_OUTPATIENT_CLINIC_OR_DEPARTMENT_OTHER): Payer: Self-pay | Admitting: Hematology & Oncology

## 2013-02-01 ENCOUNTER — Other Ambulatory Visit (HOSPITAL_BASED_OUTPATIENT_CLINIC_OR_DEPARTMENT_OTHER): Payer: Self-pay | Admitting: Lab

## 2013-02-01 VITALS — BP 147/61 | HR 66 | Temp 98.1°F | Resp 18 | Ht 68.0 in | Wt 211.0 lb

## 2013-02-01 DIAGNOSIS — C9211 Chronic myeloid leukemia, BCR/ABL-positive, in remission: Secondary | ICD-10-CM

## 2013-02-01 DIAGNOSIS — I4891 Unspecified atrial fibrillation: Secondary | ICD-10-CM

## 2013-02-01 DIAGNOSIS — C9210 Chronic myeloid leukemia, BCR/ABL-positive, not having achieved remission: Secondary | ICD-10-CM

## 2013-02-01 LAB — CBC WITH DIFFERENTIAL (CANCER CENTER ONLY)
BASO#: 0.1 10*3/uL (ref 0.0–0.2)
Eosinophils Absolute: 0.4 10*3/uL (ref 0.0–0.5)
HCT: 37.1 % — ABNORMAL LOW (ref 38.7–49.9)
HGB: 12.7 g/dL — ABNORMAL LOW (ref 13.0–17.1)
LYMPH#: 2.7 10*3/uL (ref 0.9–3.3)
LYMPH%: 30.9 % (ref 14.0–48.0)
MCV: 93 fL (ref 82–98)
MONO#: 0.9 10*3/uL (ref 0.1–0.9)
NEUT%: 53.5 % (ref 40.0–80.0)
WBC: 8.8 10*3/uL (ref 4.0–10.0)

## 2013-02-01 LAB — COMPREHENSIVE METABOLIC PANEL
ALT: 14 U/L (ref 0–53)
BUN: 10 mg/dL (ref 6–23)
CO2: 30 mEq/L (ref 19–32)
Calcium: 9.5 mg/dL (ref 8.4–10.5)
Chloride: 102 mEq/L (ref 96–112)
Creatinine, Ser: 1.25 mg/dL (ref 0.50–1.35)
Glucose, Bld: 88 mg/dL (ref 70–99)
Total Bilirubin: 0.4 mg/dL (ref 0.3–1.2)

## 2013-02-01 LAB — CHCC SATELLITE - SMEAR

## 2013-02-01 NOTE — Progress Notes (Signed)
This office note has been dictated.

## 2013-02-02 NOTE — Progress Notes (Signed)
CC:   Jason [name]. [name], MD, [facility] Marguarite Arbour, MD  DIAGNOSIS:  Chronic myelogenous leukemia, chronic phase.  CURRENT THERAPY:  [name] 200 mg p.o. daily.  INTERIM HISTORY:  Jason [name] comes in for his followup.  We checked his BCR-ABL rearrangement when he was here back in November.  He had no detectable copies of the BCR-ABL product.  As such, he is in a major molecular remission.  He feels well.  He got through the holidays without any problems.  He still has the paroxysmal atrial fibrillation.  He is on Xarelto for this.  He has had no bleeding.  He does have some constipation alternating with diarrhea.  This does not bother him much.  He has had no fever.  He has had no rashes.  He has had no headache. There has been no nausea or vomiting.  PHYSICAL EXAMINATION:  General:  This is a well-developed, well- nourished white gentleman in no obvious distress.  Vital signs:  Show a temperature of 98.1, pulse 66, respiratory rate 18, blood pressure 147/61.  Weight is 211.  Head and neck:  Shows a normocephalic, atraumatic skull.  There are no ocular or oral lesions.  There are no palpable cervical or supraclavicular lymph nodes.  Lungs:  Clear bilaterally.  Cardiac:  Regular rate and rhythm with a normal S1, S2. There are no murmurs, rubs or bruits.  Abdomen:  Soft with good bowel sounds.  There is no palpable abdominal mass.  There is no fluid wave. There is no palpable hepatosplenomegaly.  Back:  No tenderness over the spine, ribs or hips.  Extremities:  Show no clubbing, cyanosis or edema. Skin:  Shows some slightly dry skin.  LABORATORY STUDIES:  White cell count is 8.8, hemoglobin 12.7, hematocrit 37.1, platelet count 270.  IMPRESSION:  Jason [name] is a 67 year old gentleman with chronic phase chronic myelogenous leukemia.  He is in a major molecular remission right now.  He is on a lower dose of [name] but this is what he is tolerating.  We will continue to keep him on the  200 mg dose.  We will get him back in 4 months.   ______________________________ Jason [name], M.D. PRE/MEDQ  D:  02/01/2013  T:  02/02/2013  Job:  [id number]

## 2013-02-13 ENCOUNTER — Telehealth: Payer: Self-pay | Admitting: *Deleted

## 2013-02-13 NOTE — Telephone Encounter (Addendum)
Message copied by Wynonia Hazard on Tue Feb 13, 2013 12:32 PM ------      Message from: Arlan Organ R      Created: Fri Feb 09, 2013 10:35 AM       Call and let him know that his leukemia is still in remission. This is fantastic news. Thanks. Pete   Spoke to pt. He was given the above message with verbalized understanding.

## 2013-05-14 ENCOUNTER — Telehealth: Payer: Self-pay | Admitting: Internal Medicine

## 2013-05-14 DIAGNOSIS — C9210 Chronic myeloid leukemia, BCR/ABL-positive, not having achieved remission: Secondary | ICD-10-CM

## 2013-05-14 MED ORDER — HYDROCHLOROTHIAZIDE 25 MG PO TABS: 25.0000 mg | ORAL_TABLET | Freq: Every day | ORAL | Status: DC

## 2013-05-14 NOTE — Telephone Encounter (Signed)
Refill- hydrochlorothiazide 25mg  tablets. Take one tablet by mouth every day. Qty 30 last fill 4.18.14

## 2013-05-23 ENCOUNTER — Encounter: Payer: Self-pay | Admitting: Hematology & Oncology

## 2013-05-23 ENCOUNTER — Telehealth: Payer: Self-pay | Admitting: Hematology & Oncology

## 2013-05-23 NOTE — Telephone Encounter (Signed)
INDIGENT APPROVED 100% FAMILY SIZE: 3 HH INC: [id number] MOD POV: [phone],[phone] VALID DATES: 05/23/2013-11/23/2013  100% INDIGENT - PLEASE APPLY DISCOUNT TO ANY  6 MONTHS PRIOR AND ALL CURRENT BILL.

## 2013-05-31 ENCOUNTER — Other Ambulatory Visit (HOSPITAL_BASED_OUTPATIENT_CLINIC_OR_DEPARTMENT_OTHER): Payer: Medicare Other | Admitting: Lab

## 2013-05-31 ENCOUNTER — Ambulatory Visit (HOSPITAL_BASED_OUTPATIENT_CLINIC_OR_DEPARTMENT_OTHER): Payer: Medicare Other | Admitting: Medical

## 2013-05-31 VITALS — BP 136/62 | HR 59 | Temp 98.1°F | Resp 18 | Ht 68.0 in | Wt 208.0 lb

## 2013-05-31 DIAGNOSIS — C9211 Chronic myeloid leukemia, BCR/ABL-positive, in remission: Secondary | ICD-10-CM

## 2013-05-31 DIAGNOSIS — C9210 Chronic myeloid leukemia, BCR/ABL-positive, not having achieved remission: Secondary | ICD-10-CM

## 2013-05-31 LAB — COMPREHENSIVE METABOLIC PANEL
ALT: 12 U/L (ref 0–53)
AST: 15 U/L (ref 0–37)
BUN: 14 mg/dL (ref 6–23)
CO2: 24 mEq/L (ref 19–32)
Creatinine, Ser: 1.4 mg/dL — ABNORMAL HIGH (ref 0.50–1.35)
Total Bilirubin: 0.5 mg/dL (ref 0.3–1.2)

## 2013-05-31 LAB — CBC WITH DIFFERENTIAL (CANCER CENTER ONLY)
BASO%: 0.8 % (ref 0.0–2.0)
EOS%: 4.7 % (ref 0.0–7.0)
HCT: 39.9 % (ref 38.7–49.9)
LYMPH%: 30.1 % (ref 14.0–48.0)
MCHC: 33.6 g/dL (ref 32.0–35.9)
MCV: 92 fL (ref 82–98)
NEUT%: 55.1 % (ref 40.0–80.0)
RDW: 14.2 % (ref 11.1–15.7)

## 2013-05-31 NOTE — Progress Notes (Signed)
DIAGNOSIS:  Chronic myelogenous leukemia, chronic phase.  CURRENT THERAPY:  [name] 200 mg p.o. daily.  INTERIM HISTORY:  Mr. Jason [name] presents today for an office followup visit.  Overall he reports she's doing relatively well.  Back in February we checked his BCR-ABL rearrangement.  He had no detectable copies of the BCR-ABL product.  He is in a major molecular remission.  He still has proximal atrial relation.  He is on Xarelto for this.  He's not reported any bleeding complications.  He does have some constipation alternating with diarrhea.  This is tolerable.  He is on a lower dose of [name].  He reports a good appetite.  He denies any nausea or vomiting.  He denies any fevers, chills, or night sweats.  He denies any palpable adenopathy.  He denies any cough, shortness of breath or chest pain.  He denies any abdominal pain.  He denies any obvious abnormal bleeding.  He denies any generalized swelling.  He denies any headaches, visual changes or rashes.  Review of Systems: Constitutional:Negative for malaise/fatigue, fever, chills, weight loss, diaphoresis, activity change, appetite change, and unexpected weight change.  HEENT: Negative for double vision, blurred vision, visual loss, ear pain, tinnitus, congestion, rhinorrhea, epistaxis sore throat or sinus disease, oral pain/lesion, tongue soreness Respiratory: Negative for cough, chest tightness, shortness of breath, wheezing and stridor.  Cardiovascular: Negative for chest pain, palpitations, leg swelling, orthopnea, PND, DOE or claudication Gastrointestinal: Negative for nausea, vomiting, abdominal pain, diarrhea, constipation, blood in stool, melena, hematochezia, abdominal distention, anal bleeding, rectal pain, anorexia and hematemesis.  Genitourinary: Negative for dysuria, frequency, hematuria,  Musculoskeletal: Negative for myalgias, back pain, joint swelling, arthralgias and gait problem.  Skin: Negative for rash, color change, pallor and  wound.  Neurological:. Negative for dizziness/light-headedness, tremors, seizures, syncope, facial asymmetry, speech difficulty, weakness, numbness, headaches and paresthesias.  Hematological: Negative for adenopathy. Does not bruise/bleed easily.  Psychiatric/Behavioral:  Negative for depression, no loss of interest in normal activity or change in sleep pattern.   Physical Exam: This is a pleasant 67 year old well developed well nourished white gentleman in no obvious distress Vitals: Temperature 98.1 degrees pulse 59 respirations 18 blood pressure 136/62 weight 208 pounds HEENT reveals a normocephalic, atraumatic skull, no scleral icterus, no oral lesions  Neck is supple without any cervical or supraclavicular adenopathy.  Lungs are clear to auscultation bilaterally. There are no wheezes, rales or rhonci Cardiac is regular rate and rhythm with a normal S1 and S2. There are no murmurs, rubs, or bruits.  Abdomen is soft with good bowel sounds, there is no palpable mass. There is no palpable hepatosplenomegaly. There is no palpable fluid wave.  Musculoskeletal no tenderness of the spine, ribs, or hips.  Extremities there are no clubbing, cyanosis, or edema.  Skin no petechia, purpura or ecchymosis Neurologic is nonfocal.  Laboratory Data: White count 10.1 hemoglobin 13.4 hematocrit 39.9 platelets 269,000  Current Outpatient Prescriptions on File Prior to Visit  Medication Sig Dispense Refill  . acetaminophen (TYLENOL) 325 MG tablet Take 650 mg by mouth every 6 (six) hours as needed. For headache.      Marland Kitchen atorvastatin (LIPITOR) 10 MG tablet Take 10 mg by mouth daily.      . Cholecalciferol (VITAMIN D3) 5000 UNITS CAPS Take 5,000 mg by mouth every morning.      Marland Kitchen dexlansoprazole (DEXILANT) 60 MG capsule Take 1 capsule (60 mg total) by mouth daily.  15 capsule  0  . hydrochlorothiazide (HYDRODIURIL) 25 MG tablet Take 1  tablet (25 mg total) by mouth daily.  30 tablet  2  . imatinib ([name]) 100  MG tablet Take 2 tablets (200 mg total) by mouth daily. Take with meals and large glass of water.Caution:Chemotherapy.  30 tablet  6  . lisinopril (PRINIVIL,ZESTRIL) 40 MG tablet Take 1 tablet (40 mg total) by mouth daily.  30 tablet  6  . metoprolol succinate (TOPROL XL) 25 MG 24 hr tablet Take 1 tablet (25 mg total) by mouth daily.  30 tablet  11  . Multiple Vitamins-Minerals (MULTIVITAMIN WITH MINERALS) tablet Take 1 tablet by mouth daily.        . potassium chloride SA (K-DUR,KLOR-CON) 20 MEQ tablet Take 20 mEq by mouth daily.      . Probiotic Product (PROBIOTIC FORMULA PO) Take by mouth daily.      . prochlorperazine (COMPAZINE) 10 MG tablet Take 10 mg by mouth every 6 (six) hours as needed.      . Rivaroxaban (XARELTO) 20 MG TABS Take 1 tablet (20 mg total) by mouth daily.  30 tablet  6   No current facility-administered medications on file prior to visit.   Assessment/Plan: This is a pleasant 67 year old gentleman with the following issues:  #1.  Chronic phase chronic myelogenous leukemia.  He continues a major molecular remission.  He is on a lower dose of [name] but this is what he tolerates.  We will continue to keep him on 200 mg dose.  #2.  Followup.  We will follow back up with Mr. [name] in 4 months but before then should there be questions or concerns.

## 2013-06-12 ENCOUNTER — Other Ambulatory Visit: Payer: Self-pay | Admitting: *Deleted

## 2013-06-12 DIAGNOSIS — C9210 Chronic myeloid leukemia, BCR/ABL-positive, not having achieved remission: Secondary | ICD-10-CM

## 2013-06-12 MED ORDER — IMATINIB MESYLATE 100 MG PO TABS: 200.0000 mg | ORAL_TABLET | Freq: Every day | ORAL | Status: DC

## 2013-06-12 NOTE — Telephone Encounter (Signed)
Faxed rx for [name] to [facility] Assistance [facility] at [phone].

## 2013-06-20 ENCOUNTER — Encounter: Payer: Self-pay | Admitting: Cardiology

## 2013-06-20 ENCOUNTER — Ambulatory Visit (INDEPENDENT_AMBULATORY_CARE_PROVIDER_SITE_OTHER): Payer: Medicare Other | Admitting: Cardiology

## 2013-06-20 VITALS — BP 160/82 | HR 54 | Wt 209.0 lb

## 2013-06-20 DIAGNOSIS — I4891 Unspecified atrial fibrillation: Secondary | ICD-10-CM

## 2013-06-20 DIAGNOSIS — I10 Essential (primary) hypertension: Secondary | ICD-10-CM | POA: Diagnosis not present

## 2013-06-20 DIAGNOSIS — I2510 Atherosclerotic heart disease of native coronary artery without angina pectoris: Secondary | ICD-10-CM | POA: Diagnosis not present

## 2013-06-20 DIAGNOSIS — E785 Hyperlipidemia, unspecified: Secondary | ICD-10-CM

## 2013-06-20 DIAGNOSIS — Z720 Tobacco use: Secondary | ICD-10-CM

## 2013-06-20 DIAGNOSIS — F17200 Nicotine dependence, unspecified, uncomplicated: Secondary | ICD-10-CM

## 2013-06-20 NOTE — Patient Instructions (Addendum)
Your physician wants you to follow-up in: ONE YEAR WITH DR Shelda Pal will receive a reminder letter in the mail two months in advance. If you don't receive a letter, please call our office to schedule the follow-up appointment.   Your physician has requested that you have en exercise stress myoview. For further information please visit [email or web address] Please follow instruction sheet, as given.   Your physician recommends that you HAVE LAB WORK WITH STRESS TEST

## 2013-06-20 NOTE — Assessment & Plan Note (Signed)
Continue statin. Check lipids

## 2013-06-20 NOTE — Assessment & Plan Note (Signed)
Not on aspirin given need for anticoagulation. Continue statin. 

## 2013-06-20 NOTE — Assessment & Plan Note (Signed)
Blood pressure is mildly elevated. I have asked him to follow this at home and we will add additional medications as needed, most likely Norvasc.

## 2013-06-20 NOTE — Progress Notes (Signed)
HPI: Pleasant male for fu of coronary artery disease and atrial fibrillation. Patient apparently had stents placed in at [facility] in 2000. Records are not available. His last stress test was approximately one to one and a half years ago following admission for atypical chest pain. Apparently stress test was negative. Patient seen preoperatively at [facility] on September 01 2012 and electrocardiogram showed atrial fibrillation. Note patient had an MRI in July of 2013. There were scattered numerous but small foci throughout the cerebral white matter. There was evidence of tiny chronic lacunar infarcts in the inferior left cerebellum. Echocardiogram in October of 2013 showed normal LV function and mild aortic insufficiency. TSH in September of 2013 was 1.612. Patient has been maintained on anticoagulation since then. I last saw him in December 2013. Since that time, the patient has dyspnea with more extreme activities but not with routine activities. It is relieved with rest. It is not associated with chest pain. There is no orthopnea, PND or pedal edema. There is no syncope or palpitations. There is no exertional chest pain. Recent laboratories reviewed and GFR 70.    Current Outpatient Prescriptions  Medication Sig Dispense Refill  . acetaminophen (TYLENOL) 325 MG tablet Take 650 mg by mouth every 6 (six) hours as needed. For headache.      Marland Kitchen atorvastatin (LIPITOR) 10 MG tablet Take 10 mg by mouth daily.      . Cholecalciferol (VITAMIN D3) 5000 UNITS CAPS Take 5,000 mg by mouth every morning.      Marland Kitchen dexlansoprazole (DEXILANT) 60 MG capsule Take 1 capsule (60 mg total) by mouth daily.  15 capsule  0  . hydrochlorothiazide (HYDRODIURIL) 25 MG tablet Take 1 tablet (25 mg total) by mouth daily.  30 tablet  2  . imatinib ([name]) 100 MG tablet Take 2 tablets (200 mg total) by mouth daily. Take with meals and large glass of water.Caution:Chemotherapy.  30 tablet  6  . lisinopril  (PRINIVIL,ZESTRIL) 40 MG tablet Take 1 tablet (40 mg total) by mouth daily.  30 tablet  6  . metoprolol succinate (TOPROL XL) 25 MG 24 hr tablet Take 1 tablet (25 mg total) by mouth daily.  30 tablet  11  . Multiple Vitamins-Minerals (MULTIVITAMIN WITH MINERALS) tablet Take 1 tablet by mouth daily.        . potassium chloride SA (K-DUR,KLOR-CON) 20 MEQ tablet Take 20 mEq by mouth daily.      . Probiotic Product (PROBIOTIC FORMULA PO) Take by mouth daily.      . prochlorperazine (COMPAZINE) 10 MG tablet Take 10 mg by mouth every 6 (six) hours as needed.      . Rivaroxaban (XARELTO) 20 MG TABS Take 1 tablet (20 mg total) by mouth daily.  30 tablet  6   No current facility-administered medications for this visit.     Past Medical History  Diagnosis Date  . CML (chronic myelocytic leukemia) 12/16/2011  . History of chicken pox   . Hypertension   . Hyperlipidemia   . History of kidney stones   . Coronary heart disease   . Atrial fibrillation     Past Surgical History  Procedure Laterality Date  . Cholecystectomy  2003  . Coronary angioplasty with stent placement  2000    History   Social History  . Marital Status: Married    Spouse Name: N/A    Number of Children: 3  . Years of Education: N/A   Occupational History  .  Retired   Social History Main Topics  . Smoking status: Current Some Day Smoker    Types: Cigarettes  . Smokeless tobacco: Never Used     Comment: 40 yr history-i/pdd  . Alcohol Use: Yes     Comment: Rare  . Drug Use: No  . Sexually Active: Not on file   Other Topics Concern  . Not on file   Social History Narrative  . No narrative on file    ROS: no fevers or chills, productive cough, hemoptysis, dysphasia, odynophagia, melena, hematochezia, dysuria, hematuria, rash, seizure activity, orthopnea, PND, pedal edema, claudication. Remaining systems are negative.  Physical Exam: Well-developed well-nourished in no acute distress.  Skin is warm  and dry.  HEENT is normal.  Neck is supple.  Chest is clear to auscultation with normal expansion.  Cardiovascular exam is regular rate and rhythm.  Abdominal exam nontender or distended. No masses palpated. Extremities show no edema. neuro grossly intact  ECG sinus bradycardia at a rate of 54. Right bundle branch block, left anterior fascicular block, ventricular hypertrophy.

## 2013-06-20 NOTE — Assessment & Plan Note (Signed)
Patient counseled on discontinuing. 

## 2013-06-20 NOTE — Assessment & Plan Note (Signed)
Patient remains in sinus rhythm. Continue Toprol. Continue xeralto

## 2013-06-26 ENCOUNTER — Other Ambulatory Visit: Payer: Self-pay | Admitting: Internal Medicine

## 2013-06-26 NOTE — Telephone Encounter (Signed)
Refill sent per [facility] refill protocol; **Office Visit Needed Prior to Future Refills**/SLS

## 2013-07-02 ENCOUNTER — Ambulatory Visit (HOSPITAL_COMMUNITY): Payer: Medicare Other | Attending: Cardiology | Admitting: Radiology

## 2013-07-02 VITALS — BP 141/72 | HR 52 | Ht 67.0 in | Wt 207.0 lb

## 2013-07-02 DIAGNOSIS — I2510 Atherosclerotic heart disease of native coronary artery without angina pectoris: Secondary | ICD-10-CM | POA: Diagnosis not present

## 2013-07-02 DIAGNOSIS — R0602 Shortness of breath: Secondary | ICD-10-CM | POA: Diagnosis not present

## 2013-07-02 DIAGNOSIS — R0609 Other forms of dyspnea: Secondary | ICD-10-CM | POA: Insufficient documentation

## 2013-07-02 DIAGNOSIS — I4510 Unspecified right bundle-branch block: Secondary | ICD-10-CM

## 2013-07-02 DIAGNOSIS — I10 Essential (primary) hypertension: Secondary | ICD-10-CM | POA: Insufficient documentation

## 2013-07-02 DIAGNOSIS — R0989 Other specified symptoms and signs involving the circulatory and respiratory systems: Secondary | ICD-10-CM | POA: Insufficient documentation

## 2013-07-02 DIAGNOSIS — Z9861 Coronary angioplasty status: Secondary | ICD-10-CM | POA: Insufficient documentation

## 2013-07-02 DIAGNOSIS — E785 Hyperlipidemia, unspecified: Secondary | ICD-10-CM | POA: Insufficient documentation

## 2013-07-02 DIAGNOSIS — F17200 Nicotine dependence, unspecified, uncomplicated: Secondary | ICD-10-CM | POA: Diagnosis not present

## 2013-07-02 DIAGNOSIS — I4891 Unspecified atrial fibrillation: Secondary | ICD-10-CM

## 2013-07-02 MED ORDER — REGADENOSON 0.4 MG/5ML IV SOLN
0.4000 mg | Freq: Once | INTRAVENOUS | Status: AC
  Administered 2013-07-02: 0.4 mg via INTRAVENOUS

## 2013-07-02 MED ORDER — TECHNETIUM TC 99M SESTAMIBI GENERIC - CARDIOLITE
10.0000 | Freq: Once | INTRAVENOUS | Status: AC | PRN
  Administered 2013-07-02: 10 via INTRAVENOUS

## 2013-07-02 MED ORDER — TECHNETIUM TC 99M SESTAMIBI GENERIC - CARDIOLITE
30.0000 | Freq: Once | INTRAVENOUS | Status: AC | PRN
  Administered 2013-07-02: 30 via INTRAVENOUS

## 2013-07-02 NOTE — Progress Notes (Signed)
  [facility] SITE 3 NUCLEAR MED [address] [place], [place] [address] [phone]    Cardiology Nuclear Med Study  Jason [name] is a 67 y.o. male     MRN : [id number]     DOB: January 31, 1946  Procedure Date: 07/02/2013  Nuclear Med Background Indication for Stress Test:  Evaluation for Ischemia History:  '00 PTCA/Stent x 2-PDA/CFX; '11 Stress Echo:normal; '13 Echo:EF=60%, mild AI Cardiac Risk Factors: Hypertension, Lipids, RBBB and Smoker  Symptoms:  DOE   Nuclear Pre-Procedure Caffeine/Decaff Intake:  None NPO After: 9:00pm   Lungs:  Clear. O2 Sat: 97% on room air. IV 0.9% NS with Angio Cath:  20g  IV Site: R Hand  IV Started by:  Cathlyn Parsons, RN  Chest Size (in):  48 Cup Size: n/a  Height: 5\' 7"  (1.702 m)  Weight:  207 lb (93.895 kg)  BMI:  Body mass index is 32.41 kg/(m^2). Tech Comments:  Toprol no held per MD    Nuclear Med Study 1 or 2 day study: 1 day  Stress Test Type:  Treadmill/Lexiscan  Reading MD: Cassell Clement, MD  Order Authorizing Provider:  Olga Millers, MD  Resting Radionuclide: Technetium 76m Sestamibi  Resting Radionuclide Dose: 11.0 mCi   Stress Radionuclide:  Technetium 25m Sestamibi  Stress Radionuclide Dose: 33.0 mCi           Stress Protocol Rest HR: 52 Stress HR: 112  Rest BP: 141/72 Stress BP: 172/75  Exercise Time (min): 5:19 METS: 5.5   Predicted Max HR: 155 bpm % Max HR: 72.26 bpm Rate Pressure Product: 21308   Dose of Adenosine (mg):  n/a Dose of Lexiscan: 0.4 mg  Dose of Atropine (mg): n/a Dose of Dobutamine: n/a mcg/kg/min (at max HR)  Stress Test Technologist: Smiley Houseman, CMA-N  Nuclear Technologist:  Harlow Asa, CNMT     Rest Procedure:  Myocardial perfusion imaging was performed at rest 45 minutes following the intravenous administration of Technetium 52m Sestamibi.  Rest ECG: NSR-RBBB and LantH  Stress Procedure: Patient initially walked the treadmill utilizing the Bruce protocol, but was  unable to reach his target heart rate.   He c/o feeling very tired and short of breath.  He was then given IV Lexiscan 0.4 mg over 15-seconds with concurrent low level exercise and then Technetium 76m Sestamibi was injected at 30-seconds.  Quantitative spect images were obtained after a 45-minute delay.  Stress ECG: No significant change from baseline ECG  QPS Raw Data Images:  Normal; no motion artifact; normal heart/lung ratio. Stress Images:  There is decreased uptake in the inferior wall. Rest Images:  There is decreased uptake in the inferior wall. Subtraction (SDS):  There is a fixed defect that is most consistent with a previous infarction. Transient Ischemic Dilatation (Normal <1.22):  n/a Lung/Heart Ratio (Normal <0.45):  0.36  Quantitative Gated Spect Images QGS EDV:  124 ml QGS ESV:  58 ml  Impression Exercise Capacity:  Lexiscan with low level exercise. BP Response:  Normal blood pressure response. Clinical Symptoms:  No chest pain. ECG Impression:  No significant ST segment change suggestive of ischemia. Comparison with Prior Nuclear Study: No images to compare  Overall Impression:  Low risk stress nuclear study There is a medium sized fixed defect involving the basal inferior and basal inferolateral segments. No reversible ischemia..  LV Ejection Fraction: 53%.  LV Wall Motion:  NL LV Function; NL Wall Motion   [employer]

## 2013-07-09 ENCOUNTER — Telehealth: Payer: Self-pay | Admitting: Hematology & Oncology

## 2013-07-09 NOTE — Telephone Encounter (Signed)
Recvd letter from [employer] ([employer]) advising pt application will expire on 07/12/2013. Sending pt another application today via mail, to update at this time.   [employer] ID:  [id number]  [facility]

## 2013-07-12 ENCOUNTER — Telehealth: Payer: Self-pay | Admitting: Hematology & Oncology

## 2013-07-12 NOTE — Telephone Encounter (Signed)
[employer] application faxed todday to: [phone].  Pt is applying for [name] assistance. He has some refills available at this time.

## 2013-07-19 ENCOUNTER — Other Ambulatory Visit: Payer: Self-pay | Admitting: *Deleted

## 2013-07-19 DIAGNOSIS — C9210 Chronic myeloid leukemia, BCR/ABL-positive, not having achieved remission: Secondary | ICD-10-CM

## 2013-07-19 MED ORDER — IMATINIB MESYLATE 100 MG PO TABS: 200.0000 mg | ORAL_TABLET | Freq: Every day | ORAL | Status: DC

## 2013-07-30 ENCOUNTER — Other Ambulatory Visit: Payer: Self-pay | Admitting: *Deleted

## 2013-07-30 MED ORDER — LISINOPRIL 40 MG PO TABS: ORAL_TABLET | ORAL | Status: DC

## 2013-07-30 NOTE — Telephone Encounter (Signed)
Rx request to pharmacy; **Office Visit Needed Prior to Future Refills**/SLS  

## 2013-08-01 ENCOUNTER — Other Ambulatory Visit: Payer: Self-pay

## 2013-08-13 ENCOUNTER — Telehealth: Payer: Self-pay | Admitting: Cardiology

## 2013-08-13 MED ORDER — RIVAROXABAN 20 MG PO TABS: 20.0000 mg | ORAL_TABLET | Freq: Every day | ORAL | Status: DC

## 2013-08-13 NOTE — Telephone Encounter (Signed)
PT OUT OF REFILLS, XARELTO, NEEDS NEED REFILL [id number] , PLS CALL IF PROBLEM [phone]

## 2013-08-14 ENCOUNTER — Other Ambulatory Visit: Payer: Self-pay

## 2013-08-14 MED ORDER — RIVAROXABAN 20 MG PO TABS: 20.0000 mg | ORAL_TABLET | Freq: Every day | ORAL | Status: DC

## 2013-09-07 ENCOUNTER — Telehealth: Payer: Self-pay | Admitting: Physician Assistant

## 2013-09-07 NOTE — Telephone Encounter (Signed)
Patient changing pharmacy   [facility]   [place]   Needs his Lisinopril 40 mg called /   Pt last seen by Dr Rodena Medin

## 2013-09-07 NOTE — Telephone Encounter (Signed)
Rx refill pending; LMOM with contact name and number for return call RE: new pharmacy, need to know if he will be using [facility] or [facility] in [place] and further instructions that only #30 will be sent to pharmacy until a f/u OV has been completed [last OV 09.10.2013 [facility]]/SLS

## 2013-09-10 MED ORDER — LISINOPRIL 40 MG PO TABS: ORAL_TABLET | ORAL | Status: DC

## 2013-09-10 NOTE — Telephone Encounter (Signed)
Patient called back stating that he is using [facility] pharmacy only and would like that to be documented in his record. He also scheduled an appointment with Dr. Abner Greenspan for 09/20/13

## 2013-09-10 NOTE — Telephone Encounter (Signed)
Rx request to pharmacy/SLS  

## 2013-09-13 ENCOUNTER — Telehealth: Payer: Self-pay | Admitting: Cardiology

## 2013-09-13 NOTE — Telephone Encounter (Signed)
New problem    Has question regarding medication xarelto

## 2013-09-13 NOTE — Telephone Encounter (Signed)
Spoke with pt, he was questioning the use of ibuprofen and the xarelto. Explained that using ibuprofen and xarelto can increase the risk of bleeding. Explained it would be better for him to use tylenol for headaches. Pt voiced understanding

## 2013-09-20 ENCOUNTER — Ambulatory Visit (INDEPENDENT_AMBULATORY_CARE_PROVIDER_SITE_OTHER): Payer: Medicare Other | Admitting: Family Medicine

## 2013-09-20 ENCOUNTER — Telehealth: Payer: Self-pay | Admitting: Family Medicine

## 2013-09-20 ENCOUNTER — Encounter: Payer: Self-pay | Admitting: Family Medicine

## 2013-09-20 VITALS — BP 110/68 | HR 77 | Temp 98.6°F | Ht 68.0 in | Wt 203.0 lb

## 2013-09-20 DIAGNOSIS — I4891 Unspecified atrial fibrillation: Secondary | ICD-10-CM

## 2013-09-20 DIAGNOSIS — E785 Hyperlipidemia, unspecified: Secondary | ICD-10-CM | POA: Diagnosis not present

## 2013-09-20 DIAGNOSIS — E876 Hypokalemia: Secondary | ICD-10-CM

## 2013-09-20 DIAGNOSIS — Z720 Tobacco use: Secondary | ICD-10-CM

## 2013-09-20 DIAGNOSIS — Z0000 Encounter for general adult medical examination without abnormal findings: Secondary | ICD-10-CM

## 2013-09-20 DIAGNOSIS — K589 Irritable bowel syndrome without diarrhea: Secondary | ICD-10-CM | POA: Diagnosis not present

## 2013-09-20 DIAGNOSIS — C9210 Chronic myeloid leukemia, BCR/ABL-positive, not having achieved remission: Secondary | ICD-10-CM

## 2013-09-20 DIAGNOSIS — I10 Essential (primary) hypertension: Secondary | ICD-10-CM

## 2013-09-20 DIAGNOSIS — F17200 Nicotine dependence, unspecified, uncomplicated: Secondary | ICD-10-CM | POA: Diagnosis not present

## 2013-09-20 HISTORY — DX: Irritable bowel syndrome, unspecified: K58.9

## 2013-09-20 LAB — RENAL FUNCTION PANEL
BUN: 13 mg/dL (ref 6–23)
Calcium: 9.6 mg/dL (ref 8.4–10.5)
Creat: 1.31 mg/dL (ref 0.50–1.35)
Phosphorus: 2.8 mg/dL (ref 2.3–4.6)
Potassium: 3.4 mEq/L — ABNORMAL LOW (ref 3.5–5.3)

## 2013-09-20 LAB — CBC WITH DIFFERENTIAL/PLATELET
Lymphocytes Relative: 33 % (ref 12–46)
Lymphs Abs: 3.3 10*3/uL (ref 0.7–4.0)
MCH: 30.8 pg (ref 26.0–34.0)
MCV: 90.7 fL (ref 78.0–100.0)
Monocytes Relative: 7 % (ref 3–12)
Neutrophils Relative %: 55 % (ref 43–77)
Platelets: 311 10*3/uL (ref 150–400)
RBC: 3.96 MIL/uL — ABNORMAL LOW (ref 4.22–5.81)
WBC: 10.1 10*3/uL (ref 4.0–10.5)

## 2013-09-20 LAB — LIPID PANEL: LDL Cholesterol: 98 mg/dL (ref 0–99)

## 2013-09-20 LAB — HEPATIC FUNCTION PANEL
Albumin: 4.6 g/dL (ref 3.5–5.2)
Indirect Bilirubin: 0.3 mg/dL (ref 0.0–0.9)
Total Bilirubin: 0.4 mg/dL (ref 0.3–1.2)
Total Protein: 6.9 g/dL (ref 6.0–8.3)

## 2013-09-20 MED ORDER — METOPROLOL SUCCINATE ER 25 MG PO TB24: 25.0000 mg | ORAL_TABLET | Freq: Every day | ORAL | Status: DC

## 2013-09-20 NOTE — Telephone Encounter (Signed)
Lab order week of 03-11-2014 Next visit annual medicare wellness with labs at or prior to visit, lipid, renal, cbc, tsh, hepatic

## 2013-09-20 NOTE — Patient Instructions (Addendum)
Digestive Advantage probiotic or a generic is fine Metamucil or Benefeber daily or twice day    Constipation, Adult Constipation is when a person has fewer than 3 bowel movements a week; has difficulty having a bowel movement; or has stools that are dry, hard, or larger than normal. As people grow older, constipation is more common. If you try to fix constipation with medicines that make you have a bowel movement (laxatives), the problem may get worse. Long-term laxative use may cause the muscles of the colon to become weak. A low-fiber diet, not taking in enough fluids, and taking certain medicines may make constipation worse. CAUSES   Certain medicines, such as antidepressants, pain medicine, iron supplements, antacids, and water pills.   Certain diseases, such as diabetes, irritable bowel syndrome (IBS), thyroid disease, or depression.   Not drinking enough water.   Not eating enough fiber-rich foods.   Stress or travel.  Lack of physical activity or exercise.  Not going to the restroom when there is the urge to have a bowel movement.  Ignoring the urge to have a bowel movement.  Using laxatives too much. SYMPTOMS   Having fewer than 3 bowel movements a week.   Straining to have a bowel movement.   Having hard, dry, or larger than normal stools.   Feeling full or bloated.   Pain in the lower abdomen.  Not feeling relief after having a bowel movement. DIAGNOSIS  Your caregiver will take a medical history and perform a physical exam. Further testing may be done for severe constipation. Some tests may include:   A barium enema X-ray to examine your rectum, colon, and sometimes, your small intestine.  A sigmoidoscopy to examine your lower colon.  A colonoscopy to examine your entire colon. TREATMENT  Treatment will depend on the severity of your constipation and what is causing it. Some dietary treatments include drinking more fluids and eating more fiber-rich  foods. Lifestyle treatments may include regular exercise. If these diet and lifestyle recommendations do not help, your caregiver may recommend taking over-the-counter laxative medicines to help you have bowel movements. Prescription medicines may be prescribed if over-the-counter medicines do not work.  HOME CARE INSTRUCTIONS   Increase dietary fiber in your diet, such as fruits, vegetables, whole grains, and beans. Limit high-fat and processed sugars in your diet, such as [place] fries, hamburgers, cookies, candies, and soda.   A fiber supplement may be added to your diet if you cannot get enough fiber from foods.   Drink enough fluids to keep your urine clear or pale yellow.   Exercise regularly or as directed by your caregiver.   Go to the restroom when you have the urge to go. Do not hold it.  Only take medicines as directed by your caregiver. Do not take other medicines for constipation without talking to your caregiver first. SEEK IMMEDIATE MEDICAL CARE IF:   You have bright red blood in your stool.   Your constipation lasts for more than 4 days or gets worse.   You have abdominal or rectal pain.   You have thin, pencil-like stools.  You have unexplained weight loss. MAKE SURE YOU:   Understand these instructions.  Will watch your condition.  Will get help right away if you are not doing well or get worse. Document Released: 09/10/2004 Document Revised: 03/06/2012 Document Reviewed: 11/16/2011 [facility] Patient Information 2014 [place], [place].

## 2013-09-20 NOTE — Assessment & Plan Note (Signed)
Still smoking but has cut down to 6-8 cigs from 1 1/2 ppd

## 2013-09-20 NOTE — Progress Notes (Signed)
Patient ID: Jason [name], male   DOB: 1946/09/19, 67 y.o.   MRN: [id number] Jason [name] [id number] [birth year] 09/20/2013      Progress Note-Follow Up  Subjective  Chief Complaint  Chief Complaint  Patient presents with  . Medication Refill    HPI  Patient is a 67 Caucasian male who is in today for for routine followup in order to get his medication refills. He had an incident since his last visit atrial fibrillation he was being worked up for cataract surgery. He is now in [place] and following with cardiology and doing well. Denies any chest pain, palpitations or shortness of breath. Does note stones have been somewhat irregular with intermittent diarrhea and constipation. No bloody or tarry stool. No anorexia, nausea vomiting, belly or back pain. No fevers or chills. Taking medications as prescribed.  Past Medical History  Diagnosis Date  . CML (chronic myelocytic leukemia) 12/16/2011  . History of chicken pox   . Hypertension   . Hyperlipidemia   . History of kidney stones   . Coronary heart disease   . Atrial fibrillation     Past Surgical History  Procedure Laterality Date  . Cholecystectomy  2003  . Coronary angioplasty with stent placement  2000    Family History  Problem Relation Age of Onset  . Arthritis Mother   . Hypertension Mother   . Hypertension Father   . Colon cancer Neg Hx   . Breast cancer Neg Hx   . Heart disease Mother     Rheumatic fever  . Rheumatic fever Mother   . Diabetes Neg Hx   . Prostate cancer Neg Hx     History   Social History  . Marital Status: Married    Spouse Name: N/A    Number of Children: 3  . Years of Education: N/A   Occupational History  .      Retired   Social History Main Topics  . Smoking status: Current Some Day Smoker    Types: Cigarettes  . Smokeless tobacco: Never Used     Comment: 40 yr history-i/pdd  . Alcohol Use: Yes     Comment: Rare  . Drug Use: No  . Sexual Activity: Not on file   Other  Topics Concern  . Not on file   Social History Narrative  . No narrative on file    Current Outpatient Prescriptions on File Prior to Visit  Medication Sig Dispense Refill  . acetaminophen (TYLENOL) 325 MG tablet Take 650 mg by mouth every 6 (six) hours as needed. For headache.      Marland Kitchen atorvastatin (LIPITOR) 10 MG tablet Take 10 mg by mouth daily.      . Cholecalciferol (VITAMIN D3) 5000 UNITS CAPS Take 5,000 mg by mouth every morning.      Marland Kitchen dexlansoprazole (DEXILANT) 60 MG capsule Take 1 capsule (60 mg total) by mouth daily.  15 capsule  0  . hydrochlorothiazide (HYDRODIURIL) 25 MG tablet Take 1 tablet (25 mg total) by mouth daily.  30 tablet  2  . imatinib ([name]) 100 MG tablet Take 2 tablets (200 mg total) by mouth daily. Take with meals and large glass of water.Caution:Chemotherapy.  30 tablet  6  . lisinopril (PRINIVIL,ZESTRIL) 40 MG tablet TAKE 1 TABLET BY MOUTH DAILY  30 tablet  0  . metoprolol succinate (TOPROL XL) 25 MG 24 hr tablet Take 1 tablet (25 mg total) by mouth daily.  30 tablet  11  . Multiple  Vitamins-Minerals (MULTIVITAMIN WITH MINERALS) tablet Take 1 tablet by mouth daily.        . potassium chloride SA (K-DUR,KLOR-CON) 20 MEQ tablet Take 20 mEq by mouth daily.      . Probiotic Product (PROBIOTIC FORMULA PO) Take by mouth daily.      . prochlorperazine (COMPAZINE) 10 MG tablet Take 10 mg by mouth every 6 (six) hours as needed.      . Rivaroxaban (XARELTO) 20 MG TABS tablet Take 1 tablet (20 mg total) by mouth daily.  30 tablet  6   No current facility-administered medications on file prior to visit.    No Known Allergies  Review of Systems  Review of Systems  Constitutional: Negative for fever and malaise/fatigue.  HENT: Negative for congestion.   Eyes: Negative for discharge.  Respiratory: Negative for shortness of breath.   Cardiovascular: Negative for chest pain, palpitations and leg swelling.  Gastrointestinal: Positive for diarrhea and constipation.  Negative for nausea and abdominal pain.  Genitourinary: Negative for dysuria.  Musculoskeletal: Negative for falls.  Skin: Negative for rash.  Neurological: Negative for loss of consciousness and headaches.  Endo/Heme/Allergies: Negative for polydipsia.  Psychiatric/Behavioral: Negative for depression and suicidal ideas. The patient is not nervous/anxious and does not have insomnia.     Objective  BP 110/68  Pulse 77  Temp(Src) 98.6 F (37 C) (Oral)  Ht 5\' 8"  (1.727 m)  Wt 203 lb 0.6 oz (92.098 kg)  BMI 30.88 kg/m2  SpO2 95%  Physical Exam  Physical Exam  Constitutional: He is oriented to person, place, and time and well-developed, well-nourished, and in no distress. No distress.  HENT:  Head: Normocephalic and atraumatic.  Eyes: Conjunctivae are normal.  Neck: Neck supple. No thyromegaly present.  Cardiovascular: Normal rate and normal heart sounds.   No murmur heard. Pulmonary/Chest: Effort normal and breath sounds normal. No respiratory distress.  Abdominal: He exhibits no distension and no mass. There is no tenderness.  Musculoskeletal: He exhibits no edema.  Neurological: He is alert and oriented to person, place, and time.  Skin: Skin is warm.  Psychiatric: Memory, affect and judgment normal.    Lab Results  Component Value Date   TSH 1.612 09/05/2012   Lab Results  Component Value Date   WBC 10.1* 05/31/2013   HGB 13.4 05/31/2013   HCT 39.9 05/31/2013   MCV 92 05/31/2013   PLT 269 05/31/2013   Lab Results  Component Value Date   CREATININE 1.40* 05/31/2013   BUN 14 05/31/2013   NA 140 05/31/2013   K 3.2* 05/31/2013   CL 104 05/31/2013   CO2 24 05/31/2013   Lab Results  Component Value Date   ALT 12 05/31/2013   AST 15 05/31/2013   ALKPHOS 68 05/31/2013   BILITOT 0.5 05/31/2013   Lab Results  Component Value Date   CHOL 167 02/24/2012   Lab Results  Component Value Date   HDL 37* 02/24/2012   Lab Results  Component Value Date   LDLCALC 107* 02/24/2012   Lab Results   Component Value Date   TRIG 117 02/24/2012   Lab Results  Component Value Date   CHOLHDL 4.5 02/24/2012     Assessment & Plan  Tobacco use Still smoking but has cut down to 6-8 cigs from 1 1/2 ppd  Other and unspecified hyperlipidemia Check lipids today and continue current meds.  Atrial fibrillation RRR today, tolerating Xarelto  IBS (irritable bowel syndrome) Add a probiotic and fiber supplement  CML (  chronic myelocytic leukemia) Follows closely with Dr Myna Hidalgo  Hypokalemia Recheck renal panel today  HTN (hypertension) Well controlled today no changes

## 2013-09-20 NOTE — Telephone Encounter (Signed)
Lab orders placed.  

## 2013-09-20 NOTE — Assessment & Plan Note (Addendum)
RRR today, tolerating Xarelto  

## 2013-09-20 NOTE — Assessment & Plan Note (Signed)
Follows closely with Dr Myna Hidalgo

## 2013-09-20 NOTE — Assessment & Plan Note (Signed)
Add a probiotic and fiber supplement

## 2013-09-20 NOTE — Assessment & Plan Note (Signed)
Recheck renal panel today. 

## 2013-09-20 NOTE — Assessment & Plan Note (Signed)
Check lipids today and continue current meds.

## 2013-09-21 NOTE — Addendum Note (Signed)
Addended by: Court Joy on: 09/21/2013 10:04 AM   Modules accepted: Orders

## 2013-09-23 NOTE — Assessment & Plan Note (Signed)
Well controlled today no changes 

## 2013-10-01 ENCOUNTER — Ambulatory Visit (HOSPITAL_BASED_OUTPATIENT_CLINIC_OR_DEPARTMENT_OTHER): Payer: Medicare Other | Admitting: Hematology & Oncology

## 2013-10-01 ENCOUNTER — Other Ambulatory Visit (HOSPITAL_BASED_OUTPATIENT_CLINIC_OR_DEPARTMENT_OTHER): Payer: Medicare Other | Admitting: Lab

## 2013-10-01 VITALS — BP 154/71 | HR 84 | Temp 98.6°F | Resp 18 | Ht 68.0 in | Wt 210.0 lb

## 2013-10-01 DIAGNOSIS — C9210 Chronic myeloid leukemia, BCR/ABL-positive, not having achieved remission: Secondary | ICD-10-CM

## 2013-10-01 LAB — CBC WITH DIFFERENTIAL (CANCER CENTER ONLY)
BASO#: 0.1 10*3/uL (ref 0.0–0.2)
EOS%: 4.9 % (ref 0.0–7.0)
Eosinophils Absolute: 0.4 10*3/uL (ref 0.0–0.5)
HCT: 31.9 % — ABNORMAL LOW (ref 38.7–49.9)
HGB: 10.5 g/dL — ABNORMAL LOW (ref 13.0–17.1)
LYMPH%: 29 % (ref 14.0–48.0)
MCH: 30.9 pg (ref 28.0–33.4)
MCHC: 32.9 g/dL (ref 32.0–35.9)
MCV: 94 fL (ref 82–98)
MONO%: 8.8 % (ref 0.0–13.0)
NEUT#: 4.5 10*3/uL (ref 1.5–6.5)
NEUT%: 56.5 % (ref 40.0–80.0)
RDW: 14.9 % (ref 11.1–15.7)

## 2013-10-02 ENCOUNTER — Telehealth: Payer: Self-pay | Admitting: Family Medicine

## 2013-10-02 DIAGNOSIS — C9210 Chronic myeloid leukemia, BCR/ABL-positive, not having achieved remission: Secondary | ICD-10-CM

## 2013-10-02 LAB — COMPREHENSIVE METABOLIC PANEL
AST: 15 U/L (ref 0–37)
Alkaline Phosphatase: 64 U/L (ref 39–117)
BUN: 10 mg/dL (ref 6–23)
Calcium: 9.1 mg/dL (ref 8.4–10.5)
Creatinine, Ser: 1.05 mg/dL (ref 0.50–1.35)
Glucose, Bld: 88 mg/dL (ref 70–99)
Total Bilirubin: 0.3 mg/dL (ref 0.3–1.2)
Total Protein: 6.3 g/dL (ref 6.0–8.3)

## 2013-10-02 MED ORDER — HYDROCHLOROTHIAZIDE 25 MG PO TABS: 25.0000 mg | ORAL_TABLET | Freq: Every day | ORAL | Status: DC

## 2013-10-02 NOTE — Telephone Encounter (Signed)
Refill-hydrochlorothiazide 25mg  tab. Take one tablet by mouth once daily. Qty 30 last fill 9.2.14

## 2013-10-02 NOTE — Progress Notes (Signed)
This office note has been dictated.

## 2013-10-03 NOTE — Progress Notes (Signed)
DIAGNOSIS:  Chronic myeloid leukemia, chronic phase.  CURRENT THERAPY:  [name] 200 mg p.o. q. day.  INTERIM HISTORY:  Mr. Kirsch comes in for his followup.  He is doing fairly well.  We last saw him back in June.  Since then, he has had no specific problems.  He has not been hospitalized all not.  He has been seeing his cardiologist.  He has also been seeing Dr. Abner Greenspan.  When we last saw him, his BCR/ABL analysis showed that he was in a major molecular remission.  His BCR/ABL copies cannot be detected.  He has had no nausea.  He has had no diarrhea.  He has had no leg swelling.  He is on Xarelto.  He does have known atrial fibrillation.  He has had no bleeding on the Xarelto.  He has had no rashes.  He has had no cough.  There is no increased shortness of breath.  He is still smoking.  PHYSICAL EXAMINATION:  General:  This is a well-developed, well- nourished white gentleman in no obvious distress.  Vital Signs:  Show temperature 98.6, pulse 84, respiratory rate 18, blood pressure 154/71. Weight is 210 pounds.  Head and Neck:  Normocephalic, atraumatic skull. There are no ocular or oral lesions.  There are no palpable cervical, supraclavicular lymph nodes.  Lungs:  Clear bilaterally.  Cardiac: Regular rate and rhythm with a normal S1, S2.  There are no murmurs, rubs, or bruits.  Abdomen:  Soft.  He has good bowel sounds.  There is no fluid wave.  There is no palpable hepatosplenomegaly.  Back:  No tenderness over the spine, ribs, or hips.  Extremities:  Show no clubbing, cyanosis, or edema.  Neurologic:  Shows no focal neurological deficits.  Skin:  No rashes, ecchymosis, or petechia.  LABORATORY STUDIES:  White cell count is 7.9, hemoglobin 10.5, hematocrit 31.9, platelet count 227. Peripheral smear, which I reviewed, did not show any immature myeloid cells.  I saw no blasts.  He has normochromic, normocytic red cells. Platelets are adequate in number and size.  IMPRESSION:   Mr. Leu is a very nice 67 year old gentleman with chronic phase chronic myelogenous leukemia.  He is on [name].  He is on a lower dose of [name]  secondary to tolerability.  For now, will plan to get him back in another 3-4 months.  I do not think we need any blood work in between visits.  We do check his BCR/ABL copies when he comes.    ______________________________ Josph Macho, M.D. PRE/MEDQ  D:  10/02/2013  T:  10/03/2013  Job:  [id number]

## 2013-10-16 ENCOUNTER — Other Ambulatory Visit: Payer: Self-pay | Admitting: Nurse Practitioner

## 2013-10-16 ENCOUNTER — Other Ambulatory Visit (HOSPITAL_BASED_OUTPATIENT_CLINIC_OR_DEPARTMENT_OTHER): Payer: Medicare Other | Admitting: Lab

## 2013-10-16 DIAGNOSIS — C9210 Chronic myeloid leukemia, BCR/ABL-positive, not having achieved remission: Secondary | ICD-10-CM

## 2013-10-16 LAB — FECAL OCCULT BLOOD, GUIAC - CHCC SATELLITE: Occult Blood: POSITIVE

## 2013-10-18 ENCOUNTER — Telehealth: Payer: Self-pay | Admitting: Nurse Practitioner

## 2013-10-18 NOTE — Telephone Encounter (Addendum)
Message copied by Glee Arvin on Thu Oct 18, 2013 12:03 PM ------      Message from: Arlan Organ R      Created: Thu Oct 18, 2013  9:35 AM       Call - his stool is (+) for blood.  He needs to see GI.  Please call Dr. Matthias Hughs and get his office to make an appt for the patient.  Patient needs colonoscopy and/or EGD.  Jason [name].               ------LVM for the patient to call the office to receive details regarding going to see GI. Called placed to [facility] and pt has an apointment on 10/30/13 @ 130pm with Dr. Matthias Hughs. Records have been faxed to [phone].

## 2013-10-25 ENCOUNTER — Telehealth: Payer: Self-pay

## 2013-10-25 NOTE — Telephone Encounter (Signed)
OK to write him a note excusing him from jury duty for medical concerns

## 2013-10-25 NOTE — Telephone Encounter (Signed)
Pt called stating that he received a letter for jury duty and his wife has been diagnosed with ALS and he is the caregiver for her.   Pt would like a letter wrote so he can send it to the [place] duty.  Please advise?

## 2013-10-26 ENCOUNTER — Telehealth: Payer: Self-pay | Admitting: Hematology & Oncology

## 2013-10-26 NOTE — Telephone Encounter (Signed)
[facility] called for medical records. Faxed today to: Angel @ [phone]  P: [phone]

## 2013-10-27 NOTE — Telephone Encounter (Signed)
Letter done and pt informed that letter is being mailed to him

## 2013-10-30 DIAGNOSIS — R195 Other fecal abnormalities: Secondary | ICD-10-CM | POA: Diagnosis not present

## 2013-11-01 ENCOUNTER — Other Ambulatory Visit: Payer: Self-pay

## 2013-11-01 ENCOUNTER — Telehealth: Payer: Self-pay | Admitting: *Deleted

## 2013-11-01 MED ORDER — LISINOPRIL 40 MG PO TABS: ORAL_TABLET | ORAL | Status: DC

## 2013-11-01 NOTE — Telephone Encounter (Signed)
I continue to work on patient assistance through [employer] and [place] for patients xarelto, I had to refax physicians form today with MD signature will not take verbal order.

## 2013-11-02 ENCOUNTER — Telehealth: Payer: Self-pay

## 2013-11-02 NOTE — Telephone Encounter (Signed)
Patient states that its $34 for a 30 day supply of Lisinopril 40 mg and only $4 for Lisinopril 20 mg? Pt would like to know if we can send in an RX for Lisinopril 20 mg bid?  Please advise?

## 2013-11-02 NOTE — Telephone Encounter (Signed)
I actually prefer 20 mg bid OK to write rx for 30 day supply with 5 rf or 90 day supply with 1 rf

## 2013-11-05 MED ORDER — LISINOPRIL 20 MG PO TABS: 20.0000 mg | ORAL_TABLET | Freq: Two times a day (BID) | ORAL | Status: DC

## 2013-11-05 NOTE — Telephone Encounter (Signed)
Rx request to pharmacy, [facility] with contact name and number RE: new Rx/SLS

## 2013-11-28 ENCOUNTER — Ambulatory Visit (HOSPITAL_BASED_OUTPATIENT_CLINIC_OR_DEPARTMENT_OTHER): Payer: Medicare Other | Admitting: Hematology & Oncology

## 2013-11-28 ENCOUNTER — Other Ambulatory Visit (HOSPITAL_BASED_OUTPATIENT_CLINIC_OR_DEPARTMENT_OTHER): Payer: Medicare Other | Admitting: Lab

## 2013-11-28 VITALS — BP 123/59 | HR 63 | Temp 98.3°F | Resp 18 | Ht 68.0 in | Wt 196.0 lb

## 2013-11-28 DIAGNOSIS — C9210 Chronic myeloid leukemia, BCR/ABL-positive, not having achieved remission: Secondary | ICD-10-CM

## 2013-11-28 LAB — COMPREHENSIVE METABOLIC PANEL
ALT: 18 U/L (ref 0–53)
AST: 16 U/L (ref 0–37)
Calcium: 9.4 mg/dL (ref 8.4–10.5)
Chloride: 108 mEq/L (ref 96–112)
Creatinine, Ser: 1.19 mg/dL (ref 0.50–1.35)
Total Bilirubin: 0.3 mg/dL (ref 0.3–1.2)

## 2013-11-28 LAB — CBC WITH DIFFERENTIAL (CANCER CENTER ONLY)
BASO#: 0.1 10*3/uL (ref 0.0–0.2)
BASO%: 1.2 % (ref 0.0–2.0)
EOS%: 3.5 % (ref 0.0–7.0)
Eosinophils Absolute: 0.3 10*3/uL (ref 0.0–0.5)
HCT: 35.2 % — ABNORMAL LOW (ref 38.7–49.9)
HGB: 11.5 g/dL — ABNORMAL LOW (ref 13.0–17.1)
LYMPH#: 2.1 10*3/uL (ref 0.9–3.3)
LYMPH%: 24.9 % (ref 14.0–48.0)
MCH: 30.7 pg (ref 28.0–33.4)
MCHC: 32.7 g/dL (ref 32.0–35.9)
MCV: 94 fL (ref 82–98)
MONO#: 0.6 10*3/uL (ref 0.1–0.9)
MONO%: 7.3 % (ref 0.0–13.0)
NEUT#: 5.4 10*3/uL (ref 1.5–6.5)
NEUT%: 63.1 % (ref 40.0–80.0)
Platelets: 282 10*3/uL (ref 145–400)
RBC: 3.74 10*6/uL — ABNORMAL LOW (ref 4.20–5.70)
RDW: 13.8 % (ref 11.1–15.7)
WBC: 8.6 10*3/uL (ref 4.0–10.0)

## 2013-11-28 LAB — MAGNESIUM: Magnesium: 1.9 mg/dL (ref 1.5–2.5)

## 2013-11-28 NOTE — Progress Notes (Signed)
This office note has been dictated.

## 2013-12-02 NOTE — Progress Notes (Signed)
DIAGNOSIS:  Chronic myeloid leukemia -- chronic phase.  CURRENT THERAPY:  [name] 200 mg p.o. daily.  INTERIM HISTORY:  Jason [name] comes in for followup.  He is doing well. We last saw him back in October.  Since we last saw him, he has really had no issues.  He does have atrial fibrillation.  He is on [name] for this.  He has tolerated [name] pretty well.  When we last saw him in October, his BCR/ABL analysis was not detectable.  As such, he is in a major molecular remission.  Lower dose of [name] has helped him out.  He is tolerant of this.  PHYSICAL EXAMINATION:  General:  This is a well-developed, well- nourished white gentleman, in no obvious distress.  Vital Signs: Temperature of 98.3, pulse 63, respiratory rate 18, blood pressure 123/59, weight is 196 pounds.  Head and Neck:  Normocephalic, atraumatic skull.  He has no ocular or oral lesions.  There are no palpable cervical or supraclavicular lymph nodes.  Lungs:  Clear bilaterally. Cardiac:  Regular rate and rhythm with normal S1 and S2.  There are no murmurs, rubs, or bruits.  Abdomen:  Soft.  He has good bowel sounds. There is no fluid wave.  There is no palpable abdominal mass.  There is no palpable hepatosplenomegaly.  Back:  No tenderness over the spine, ribs, or hips.  Extremities:  No clubbing, cyanosis, or edema. Neurological:  No focal neurological deficits.  LABORATORY STUDIES:  White cell count is 8.6, hemoglobin 11.5, hematocrit 35.2, platelet count 282.  MCV is 94.  On his blood smear, I do not see any immature myeloid cells.  He has no nucleated red blood cells.  Platelets are adequate in number and size.  IMPRESSION:  Jason [name] is a nice 67 year old gentleman.  He has chronic phase chronic myeloid leukemia.  We have been seeing him now for a couple of years.  He has done very, very nicely to date.  I think we can probably get him back in 4 months now.  I do not see that we need to do any blood work on  him in between visits.  I think if we find that his blood work looks good in 4 months, I think that we may even go 6 months.   ______________________________ Jason [name], M.D. PRE/MEDQ  D:  11/28/2013  T:  12/02/2013  Job:  [id number]

## 2013-12-02 NOTE — Progress Notes (Signed)
DIAGNOSIS:  Chronic myeloid leukemia, chronic phase.  CURRENT THERAPY:  [name] 200 mg p.o. daily.  INTERIM HISTORY:  Mr. Jason [name] comes in for followup.  He is doing well. We last saw him back in October.  Since we last saw him, he has really had no issues.  He does have atrial fibrillation.  He is on [name] for this.  He has tolerated [name] pretty well.  When we last saw him in October, his BCR-ABL analysis was not detectable.  As such, he is in a major molecular remission.  Lower dose of [name] has helped him out.  He is tolerant of this.  PHYSICAL EXAMINATION:  General:  This is a well-developed, well- nourished white gentleman in no obvious distress.  Vital Signs: Temperature 98.3, pulse 63, respiratory rate 18, blood pressure 123/59, weight is 196 pounds.  Head and Neck:  Normocephalic, atraumatic skull. He has no ocular or oral lesions.  There are no palpable cervical or supraclavicular lymph nodes.  Lungs:  Clear bilaterally.  Cardiac: Regular rate and rhythm with normal S1 and S2.  There are no murmurs, rubs, or bruits.  Abdomen:  Soft.  He has good bowel sounds.  There is no fluid wave.  There is no palpable abdominal mass.  There is no palpable hepatosplenomegaly.  Back:  No tenderness over the spine, ribs, or hips.  Extremities:  No clubbing, cyanosis, or edema.  Neurological: No focal neurological deficits.  LABORATORY STUDIES:  White cell count is 8.6, hemoglobin 11.5, hematocrit 35.2, platelet count 282.  MCV is 94.  Peripheral blood smear, I do not see any immature myeloid cells.  He has no nucleated red blood cells.  Platelets are adequate in number and size.  IMPRESSION:  Mr. Jason [name] is a nice 67 year old gentleman.  He has chronic phase chronic myeloid leukemia.  We have been seeing him now for a couple of years.  He has done very, very nicely to date.  I think we can probably get him back in 4 months now.  I do not see that we need to do any blood work on him  in between visits.  I think if we find that his blood work looks good in 4 months, then maybe we can go 6 months.    ______________________________ Josph Macho, M.D. PRE/MEDQ  D:  11/28/2013  T:  12/02/2013  Job:  [id number]

## 2013-12-04 ENCOUNTER — Telehealth: Payer: Self-pay | Admitting: Nurse Practitioner

## 2013-12-04 NOTE — Telephone Encounter (Addendum)
Message copied by Glee Arvin on Tue Dec 04, 2013  3:24 PM ------      Message from: Arlan Organ R      Created: Mon Dec 03, 2013  9:47 PM       Call - CML still in remission!!  Merry Christmas!  Jason [name] ------Pt verbalized understanding and appreciation.

## 2013-12-06 ENCOUNTER — Telehealth: Payer: Self-pay | Admitting: Hematology & Oncology

## 2013-12-06 NOTE — Telephone Encounter (Signed)
EPP exp on 11/23/2013. Pt in ofc today to apply for the [facility] [employer] Policy Program. Pt does have Medicare A B. ° °Application is being forwarded to [facility] today via inter ofc mail. °

## 2013-12-10 ENCOUNTER — Telehealth: Payer: Self-pay | Admitting: Cardiology

## 2013-12-10 NOTE — Telephone Encounter (Signed)
Received request from Nurse fax box, documents faxed for surgical clearance. To: Jarold Song  Fax number: [phone] Attention: 12/10/13/km

## 2013-12-14 ENCOUNTER — Other Ambulatory Visit: Payer: Self-pay | Admitting: *Deleted

## 2013-12-14 DIAGNOSIS — C9210 Chronic myeloid leukemia, BCR/ABL-positive, not having achieved remission: Secondary | ICD-10-CM

## 2013-12-14 MED ORDER — IMATINIB MESYLATE 100 MG PO TABS: 200.0000 mg | ORAL_TABLET | Freq: Every day | ORAL | Status: DC

## 2014-02-25 NOTE — Telephone Encounter (Signed)
Error message, close encounter  

## 2014-03-12 DIAGNOSIS — I10 Essential (primary) hypertension: Secondary | ICD-10-CM | POA: Diagnosis not present

## 2014-03-12 DIAGNOSIS — Z0000 Encounter for general adult medical examination without abnormal findings: Secondary | ICD-10-CM | POA: Diagnosis not present

## 2014-03-12 DIAGNOSIS — E785 Hyperlipidemia, unspecified: Secondary | ICD-10-CM | POA: Diagnosis not present

## 2014-03-12 LAB — LIPID PANEL
Cholesterol: 167 mg/dL (ref 0–200)
HDL: 33 mg/dL — ABNORMAL LOW (ref >39)
LDL Cholesterol: 115 mg/dL — ABNORMAL HIGH (ref 0–99)
Total CHOL/HDL Ratio: 5.1 Ratio
Triglycerides: 95 mg/dL (ref <150)
VLDL: 19 mg/dL (ref 0–40)

## 2014-03-12 LAB — HEPATIC FUNCTION PANEL
ALT: 12 U/L (ref 0–53)
AST: 15 U/L (ref 0–37)
Albumin: 4.3 g/dL (ref 3.5–5.2)
Alkaline Phosphatase: 87 U/L (ref 39–117)
Bilirubin, Direct: 0.1 mg/dL (ref 0.0–0.3)
Indirect Bilirubin: 0.3 mg/dL (ref 0.2–1.2)
Total Bilirubin: 0.4 mg/dL (ref 0.2–1.2)
Total Protein: 7 g/dL (ref 6.0–8.3)

## 2014-03-12 LAB — RENAL FUNCTION PANEL
Albumin: 4.3 g/dL (ref 3.5–5.2)
BUN: 15 mg/dL (ref 6–23)
CO2: 29 mEq/L (ref 19–32)
Calcium: 9.5 mg/dL (ref 8.4–10.5)
Chloride: 103 mEq/L (ref 96–112)
Creat: 1.28 mg/dL (ref 0.50–1.35)
Glucose, Bld: 91 mg/dL (ref 70–99)
Phosphorus: 3.5 mg/dL (ref 2.3–4.6)
Potassium: 3.9 mEq/L (ref 3.5–5.3)
Sodium: 139 mEq/L (ref 135–145)

## 2014-03-12 LAB — CBC
HCT: 38 % — ABNORMAL LOW (ref 39.0–52.0)
Hemoglobin: 12.8 g/dL — ABNORMAL LOW (ref 13.0–17.0)
MCH: 29.9 pg (ref 26.0–34.0)
MCHC: 33.7 g/dL (ref 30.0–36.0)
MCV: 88.8 fL (ref 78.0–100.0)
Platelets: 311 10*3/uL (ref 150–400)
RBC: 4.28 MIL/uL (ref 4.22–5.81)
RDW: 14.7 % (ref 11.5–15.5)
WBC: 11.2 10*3/uL — ABNORMAL HIGH (ref 4.0–10.5)

## 2014-03-13 LAB — TSH: TSH: 1.543 u[IU]/mL (ref 0.350–4.500)

## 2014-03-18 ENCOUNTER — Encounter: Payer: Self-pay | Admitting: Family Medicine

## 2014-03-18 ENCOUNTER — Ambulatory Visit (INDEPENDENT_AMBULATORY_CARE_PROVIDER_SITE_OTHER): Payer: Medicare Other | Admitting: Family Medicine

## 2014-03-18 VITALS — BP 124/72 | HR 68 | Temp 98.1°F | Ht 68.0 in | Wt 195.0 lb

## 2014-03-18 DIAGNOSIS — E785 Hyperlipidemia, unspecified: Secondary | ICD-10-CM

## 2014-03-18 DIAGNOSIS — Z0000 Encounter for general adult medical examination without abnormal findings: Secondary | ICD-10-CM

## 2014-03-18 DIAGNOSIS — C9210 Chronic myeloid leukemia, BCR/ABL-positive, not having achieved remission: Secondary | ICD-10-CM

## 2014-03-18 DIAGNOSIS — F4321 Adjustment disorder with depressed mood: Secondary | ICD-10-CM | POA: Diagnosis not present

## 2014-03-18 DIAGNOSIS — K589 Irritable bowel syndrome without diarrhea: Secondary | ICD-10-CM

## 2014-03-18 DIAGNOSIS — I4891 Unspecified atrial fibrillation: Secondary | ICD-10-CM

## 2014-03-18 DIAGNOSIS — F4320 Adjustment disorder, unspecified: Secondary | ICD-10-CM

## 2014-03-18 DIAGNOSIS — I10 Essential (primary) hypertension: Secondary | ICD-10-CM

## 2014-03-18 HISTORY — DX: Adjustment disorder, unspecified: F43.20

## 2014-03-18 HISTORY — DX: Adjustment disorder with depressed mood: F43.21

## 2014-03-18 MED ORDER — ATORVASTATIN CALCIUM 10 MG PO TABS: 10.0000 mg | ORAL_TABLET | Freq: Every day | ORAL | Status: DC

## 2014-03-18 NOTE — Assessment & Plan Note (Signed)
Well controlled, no changes to meds. Encouraged heart healthy diet such as the DASH diet and exercise as tolerated.  °

## 2014-03-18 NOTE — Patient Instructions (Addendum)
Probiotic daily  Call GI about colonoscopy Fiber daily such as Benefiber  Irritable Bowel Syndrome Irritable Bowel Syndrome (IBS) is caused by a disturbance of normal bowel function. Other terms used are spastic colon, mucous colitis, and irritable colon. It does not require surgery, nor does it lead to cancer. There is no cure for IBS. But with proper diet, stress reduction, and medication, you will find that your problems (symptoms) will gradually disappear or improve. IBS is a common digestive disorder. It usually appears in late adolescence or early adulthood. Women develop it twice as often as men. CAUSES  After food has been digested and absorbed in the small intestine, waste material is moved into the colon (large intestine). In the colon, water and salts are absorbed from the undigested products coming from the small intestine. The remaining residue, or fecal material, is held for elimination. Under normal circumstances, gentle, rhythmic contractions on the bowel walls push the fecal material along the colon towards the rectum. In IBS, however, these contractions are irregular and poorly coordinated. The fecal material is either retained too long, resulting in constipation, or expelled too soon, producing diarrhea. SYMPTOMS  The most common symptom of IBS is pain. It is typically in the lower left side of the belly (abdomen). But it may occur anywhere in the abdomen. It can be felt as heartburn, backache, or even as a dull pain in the arms or shoulders. The pain comes from excessive bowel-muscle spasms and from the buildup of gas and fecal material in the colon. This pain:  Can range from sharp belly (abdominal) cramps to a dull, continuous ache.  Usually worsens soon after eating.  Is typically relieved by having a bowel movement or passing gas. Abdominal pain is usually accompanied by constipation. But it may also produce diarrhea. The diarrhea typically occurs right after a meal or upon  arising in the morning. The stools are typically soft and watery. They are often flecked with secretions (mucus). Other symptoms of IBS include:  Bloating.  Loss of appetite.  Heartburn.  Feeling sick to your stomach (nausea).  Belching  Vomiting  Gas. IBS may also cause a number of symptoms that are unrelated to the digestive system:  Fatigue.  Headaches.  Anxiety  Shortness of breath  Difficulty in concentrating.  Dizziness. These symptoms tend to come and go. DIAGNOSIS  The symptoms of IBS closely mimic the symptoms of other, more serious digestive disorders. So your caregiver may wish to perform a variety of additional tests to exclude these disorders. He/she wants to be certain of learning what is wrong (diagnosis). The nature and purpose of each test will be explained to you. TREATMENT A number of medications are available to help correct bowel function and/or relieve bowel spasms and abdominal pain. Among the drugs available are:  Mild, non-irritating laxatives for severe constipation and to help restore normal bowel habits.  Specific anti-diarrheal medications to treat severe or prolonged diarrhea.  Anti-spasmodic agents to relieve intestinal cramps.  Your caregiver may also decide to treat you with a mild tranquilizer or sedative during unusually stressful periods in your life. The important thing to remember is that if any drug is prescribed for you, make sure that you take it exactly as directed. Make sure that your caregiver knows how well it worked for you. HOME CARE INSTRUCTIONS   Avoid foods that are high in fat or oils. Some examples WJX:BJYNW cream, butter, frankfurters, sausage, and other fatty meats.  Avoid foods that have a  laxative effect, such as fruit, fruit juice, and dairy products.  Cut out carbonated drinks, chewing gum, and "gassy" foods, such as beans and cabbage. This may help relieve bloating and belching.  Bran taken with plenty of  liquids may help relieve constipation.  Keep track of what foods seem to trigger your symptoms.  Avoid emotionally charged situations or circumstances that produce anxiety.  Start or continue exercising.  Get plenty of rest and sleep. MAKE SURE YOU:   Understand these instructions.  Will watch your condition.  Will get help right away if you are not doing well or get worse. Document Released: 12/13/2005 Document Revised: 03/06/2012 Document Reviewed: 08/02/2008 [facility] Patient Information 2014 [place].

## 2014-03-18 NOTE — Assessment & Plan Note (Signed)
Only taking the Atorvastin once weekly since his wife died. Encouraged to consider taking it 3 x week.

## 2014-03-18 NOTE — Assessment & Plan Note (Signed)
Wife diagnosed and passed from [place] in 1/15. Sad today but feels he is OK. She died at home with Hospice services. Encouraged to use bereavement services and to consider meds if worsens

## 2014-03-18 NOTE — Progress Notes (Signed)
Pre visit review using our clinic review tool, if applicable. No additional management support is needed unless otherwise documented below in the visit note. 

## 2014-03-18 NOTE — Progress Notes (Signed)
Patient ID: Jason [name], male   DOB: [birth year], 68 y.o.   MRN: [id number] JOVONTE COMMINS [id number] 1946/07/11 03/18/2014      Progress Note-Follow Up  Subjective  Chief Complaint  Chief Complaint  Patient presents with  . Annual Exam    medicare wellness    HPI  Patient is a 68 year old male in today for routine medical care. Under a great deal of stress lately but is holding up fairly well. His wife passed away earlier this year at home under hospice care from [facility]. He has been struggling with that diagnosis for many years. Physically he feels fairly well. Has noted some recent congestion and clear rhinorrhea but no fevers or chills. No sore throat, headache or ear pain. Denies CP/palp/SOB/HA/congestion/fevers/GI or GU c/o. Taking meds as prescribed Past Medical History  Diagnosis Date  . CML (chronic myelocytic leukemia) 12/16/2011  . History of chicken pox   . Hypertension   . Hyperlipidemia   . History of kidney stones   . Coronary heart disease   . Atrial fibrillation   . IBS (irritable bowel syndrome) 09/20/2013  . Grief reaction 03/18/2014    Past Surgical History  Procedure Laterality Date  . Cholecystectomy  2003  . Coronary angioplasty with stent placement  2000    Family History  Problem Relation Age of Onset  . Arthritis Mother   . Hypertension Mother   . Hypertension Father   . Colon cancer Neg Hx   . Breast cancer Neg Hx   . Heart disease Mother     Rheumatic fever  . Rheumatic fever Mother   . Diabetes Neg Hx   . Prostate cancer Neg Hx     History   Social History  . Marital Status: Married    Spouse Name: N/A    Number of Children: 3  . Years of Education: N/A   Occupational History  .      Retired   Social History Main Topics  . Smoking status: Current Some Day Smoker    Types: Cigarettes  . Smokeless tobacco: Never Used     Comment: 40 yr history-i/pdd  . Alcohol Use: Yes     Comment: Rare  . Drug Use: No  . Sexual Activity: Not on  file   Other Topics Concern  . Not on file   Social History Narrative  . No narrative on file    Current Outpatient Prescriptions on File Prior to Visit  Medication Sig Dispense Refill  . acetaminophen (TYLENOL) 325 MG tablet Take 650 mg by mouth every 6 (six) hours as needed. For headache.      . Cholecalciferol (VITAMIN D3) 5000 UNITS CAPS Take 5,000 mg by mouth every morning.      Marland Kitchen dexlansoprazole (DEXILANT) 60 MG capsule Take 1 capsule (60 mg total) by mouth daily.  15 capsule  0  . hydrochlorothiazide (HYDRODIURIL) 25 MG tablet Take 1 tablet (25 mg total) by mouth daily.  30 tablet  6  . imatinib ([name]) 100 MG tablet Take 2 tablets (200 mg total) by mouth daily. Take with meals and large glass of water.Caution:Chemotherapy.  30 tablet  6  . lisinopril (PRINIVIL,ZESTRIL) 20 MG tablet Take 20 mg by mouth daily.      . metoprolol succinate (TOPROL XL) 25 MG 24 hr tablet Take 1 tablet (25 mg total) by mouth daily.  30 tablet  11  . Multiple Vitamins-Minerals (MULTIVITAMIN WITH MINERALS) tablet Take 1 tablet by mouth daily.        Marland Kitchen  potassium chloride SA (K-DUR,KLOR-CON) 20 MEQ tablet Take 20 mEq by mouth daily.      . Probiotic Product (PROBIOTIC FORMULA PO) Take by mouth daily.      . prochlorperazine (COMPAZINE) 10 MG tablet Take 10 mg by mouth every 6 (six) hours as needed.      . Rivaroxaban (XARELTO) 20 MG TABS tablet Take 1 tablet (20 mg total) by mouth daily.  30 tablet  6   No current facility-administered medications on file prior to visit.    No Known Allergies  Review of Systems  Review of Systems  Constitutional: Negative for fever, chills and malaise/fatigue.  HENT: Positive for congestion. Negative for hearing loss and nosebleeds.   Eyes: Negative for discharge.  Respiratory: Negative for cough, sputum production, shortness of breath and wheezing.   Cardiovascular: Negative for chest pain, palpitations and leg swelling.  Gastrointestinal: Negative for  heartburn, nausea, vomiting, abdominal pain, diarrhea, constipation and blood in stool.  Genitourinary: Negative for dysuria, urgency, frequency and hematuria.  Musculoskeletal: Negative for back pain, falls and myalgias.  Skin: Negative for rash.  Neurological: Negative for dizziness, tremors, sensory change, focal weakness, loss of consciousness, weakness and headaches.  Endo/Heme/Allergies: Negative for polydipsia. Does not bruise/bleed easily.  Psychiatric/Behavioral: Positive for depression. Negative for suicidal ideas. The patient has insomnia. The patient is not nervous/anxious.     Objective  BP 124/72  Pulse 68  Temp(Src) 98.1 F (36.7 C) (Oral)  Ht 5\' 8"  (1.727 m)  Wt 195 lb 0.6 oz (88.47 kg)  BMI 29.66 kg/m2  SpO2 97%  Physical Exam  Physical Exam  Constitutional: He is oriented to person, place, and time and well-developed, well-nourished, and in no distress. No distress.  HENT:  Head: Normocephalic and atraumatic.  Eyes: Conjunctivae are normal.  Neck: Neck supple. No thyromegaly present.  Cardiovascular: Normal rate, regular rhythm and normal heart sounds.   No murmur heard. Pulmonary/Chest: Effort normal and breath sounds normal. No respiratory distress.  Abdominal: He exhibits no distension and no mass. There is no tenderness.  Musculoskeletal: He exhibits no edema.  Neurological: He is alert and oriented to person, place, and time.  Skin: Skin is warm.  Psychiatric: Memory, affect and judgment normal.    Lab Results  Component Value Date   TSH 1.543 03/12/2014   Lab Results  Component Value Date   WBC 11.2* 03/12/2014   HGB 12.8* 03/12/2014   HCT 38.0* 03/12/2014   MCV 88.8 03/12/2014   PLT 311 03/12/2014   Lab Results  Component Value Date   CREATININE 1.28 03/12/2014   BUN 15 03/12/2014   NA 139 03/12/2014   K 3.9 03/12/2014   CL 103 03/12/2014   CO2 29 03/12/2014   Lab Results  Component Value Date   ALT 12 03/12/2014   AST 15 03/12/2014   ALKPHOS  87 03/12/2014   BILITOT 0.4 03/12/2014   Lab Results  Component Value Date   CHOL 167 03/12/2014   Lab Results  Component Value Date   HDL 33* 03/12/2014   Lab Results  Component Value Date   LDLCALC 115* 03/12/2014   Lab Results  Component Value Date   TRIG 95 03/12/2014   Lab Results  Component Value Date   CHOLHDL 5.1 03/12/2014     Assessment & Plan  Grief reaction Wife diagnosed and passed from [place] in 1/15. Sad today but feels he is OK. She died at home with Hospice services. Encouraged to use bereavement services and to  consider meds if worsens  HTN (hypertension) Well controlled, no changes to meds. Encouraged heart healthy diet such as the DASH diet and exercise as tolerated.   Other and unspecified hyperlipidemia Only taking the Atorvastin once weekly since his wife died. Encouraged to consider taking it 3 x week.   Atrial fibrillation Tolerating Xarelto, rate controlled, following with cardiology  CML (chronic myelocytic leukemia) Follows with oncology stable on [place]  Medicare annual wellness visit, subsequent Patient denies any difficulties at home. No trouble with ADLs, depression or falls. No recent changes to vision or hearing. Is UTD with immunizations. Is UTD with screening. Discussed Advanced Directives, patient agrees to bring [place] copies of documents if can. Encouraged heart healthy diet, exercise as tolerated and adequate sleep. Declines Prevnar and Tdap today. Has EKG with cardiology. Vision acceptable. Did not proceed with colonoscopy due to wife's passing but agrees to proceed now.   IBS (irritable bowel syndrome) Avoid offending foods, start probiotics. Do not eat large meals in late evening and consider raising head of bed.

## 2014-03-19 ENCOUNTER — Telehealth: Payer: Self-pay | Admitting: Family Medicine

## 2014-03-19 NOTE — Telephone Encounter (Signed)
Relevant patient education assigned to patient using Emmi. ° °

## 2014-03-23 ENCOUNTER — Encounter: Payer: Self-pay | Admitting: Family Medicine

## 2014-03-23 DIAGNOSIS — Z0000 Encounter for general adult medical examination without abnormal findings: Secondary | ICD-10-CM | POA: Insufficient documentation

## 2014-03-23 HISTORY — DX: Encounter for general adult medical examination without abnormal findings: Z00.00

## 2014-03-23 NOTE — Assessment & Plan Note (Signed)
Avoid offending foods, start probiotics. Do not eat large meals in late evening and consider raising head of bed.  

## 2014-03-23 NOTE — Assessment & Plan Note (Signed)
Tolerating Xarelto, rate controlled, following with cardiology

## 2014-03-23 NOTE — Assessment & Plan Note (Addendum)
Patient denies any difficulties at home. No trouble with ADLs, depression or falls. No recent changes to vision or hearing. Is UTD with immunizations. Is UTD with screening. Discussed Advanced Directives, patient agrees to bring [place] copies of documents if can. Encouraged heart healthy diet, exercise as tolerated and adequate sleep. Declines Prevnar and Tdap today. Has EKG with cardiology. Vision acceptable. Did not proceed with colonoscopy due to wife's passing but agrees to proceed now.

## 2014-03-23 NOTE — Assessment & Plan Note (Signed)
Follows with oncology stable on [name]

## 2014-03-29 ENCOUNTER — Other Ambulatory Visit (HOSPITAL_BASED_OUTPATIENT_CLINIC_OR_DEPARTMENT_OTHER): Payer: Medicare Other | Admitting: Lab

## 2014-03-29 ENCOUNTER — Ambulatory Visit (HOSPITAL_BASED_OUTPATIENT_CLINIC_OR_DEPARTMENT_OTHER): Payer: Medicare Other | Admitting: Hematology & Oncology

## 2014-03-29 ENCOUNTER — Encounter: Payer: Self-pay | Admitting: Hematology & Oncology

## 2014-03-29 VITALS — BP 137/64 | HR 65 | Temp 98.1°F | Resp 18 | Ht 67.0 in | Wt 195.0 lb

## 2014-03-29 DIAGNOSIS — C9210 Chronic myeloid leukemia, BCR/ABL-positive, not having achieved remission: Secondary | ICD-10-CM | POA: Diagnosis not present

## 2014-03-29 DIAGNOSIS — C9211 Chronic myeloid leukemia, BCR/ABL-positive, in remission: Secondary | ICD-10-CM

## 2014-03-29 LAB — CBC WITH DIFFERENTIAL (CANCER CENTER ONLY)
BASO#: 0.1 10*3/uL (ref 0.0–0.2)
BASO%: 0.7 % (ref 0.0–2.0)
EOS%: 3.1 % (ref 0.0–7.0)
Eosinophils Absolute: 0.3 10*3/uL (ref 0.0–0.5)
HCT: 39.7 % (ref 38.7–49.9)
HEMOGLOBIN: 13.1 g/dL (ref 13.0–17.1)
LYMPH#: 2.3 10*3/uL (ref 0.9–3.3)
LYMPH%: 25.5 % (ref 14.0–48.0)
MCH: 30.3 pg (ref 28.0–33.4)
MCHC: 33 g/dL (ref 32.0–35.9)
MCV: 92 fL (ref 82–98)
MONO#: 0.8 10*3/uL (ref 0.1–0.9)
MONO%: 8.6 % (ref 0.0–13.0)
NEUT#: 5.6 10*3/uL (ref 1.5–6.5)
NEUT%: 62.1 % (ref 40.0–80.0)
Platelets: 265 10*3/uL (ref 145–400)
RBC: 4.33 10*6/uL (ref 4.20–5.70)
RDW: 14.8 % (ref 11.1–15.7)
WBC: 9 10*3/uL (ref 4.0–10.0)

## 2014-03-29 LAB — COMPREHENSIVE METABOLIC PANEL
ALK PHOS: 82 U/L (ref 39–117)
ALT: 19 U/L (ref 0–53)
AST: 18 U/L (ref 0–37)
Albumin: 4.4 g/dL (ref 3.5–5.2)
BUN: 13 mg/dL (ref 6–23)
CALCIUM: 9.6 mg/dL (ref 8.4–10.5)
CO2: 26 mEq/L (ref 19–32)
Chloride: 102 mEq/L (ref 96–112)
Creatinine, Ser: 1.35 mg/dL (ref 0.50–1.35)
GLUCOSE: 87 mg/dL (ref 70–99)
Potassium: 4.4 mEq/L (ref 3.5–5.3)
Sodium: 138 mEq/L (ref 135–145)
Total Bilirubin: 0.4 mg/dL (ref 0.2–1.2)
Total Protein: 7.1 g/dL (ref 6.0–8.3)

## 2014-03-29 LAB — LACTATE DEHYDROGENASE: LDH: 140 U/L (ref 94–250)

## 2014-03-29 NOTE — Progress Notes (Signed)
Hematology and Oncology Follow Up Visit  COUNCIL MUNGUIA [id number] [birth year] 68 y.o. 03/29/2014   Principle Diagnosis:   Chronic phase CML  Paroxysmal atrial fibrillation  Current Therapy:    [name] 200 mg by mouth daily  Xarelto 20 mg by mouth daily     Interim History:  Mr.  Flakes is back for followup. Unfortunately, his wife passed away in January 28, 2023 from [place]. There's been tough on him. They were married for 3 years.  He is getting by. He still smoking.  He's had no problems with the Xarelto with his atrial fibrillation. He's had no bleeding.  He tolerated the [place] very well. Pap or last saw him, he was still in a major molecular remission as his BCR/ABL could not be detected.  He's had no fever. He's had no change in bowel or bladder habits. There's been no rashes. He's had no leg swelling.  Medications: Current outpatient prescriptions:acetaminophen (TYLENOL) 325 MG tablet, Take 650 mg by mouth every 6 (six) hours as needed. For headache., Disp: , Rfl: ;  atorvastatin (LIPITOR) 10 MG tablet, Take 1 tablet (10 mg total) by mouth daily., Disp: 30 tablet, Rfl: 5;  Cholecalciferol (VITAMIN D3) 5000 UNITS CAPS, Take 5,000 mg by mouth every morning., Disp: , Rfl:  hydrochlorothiazide (HYDRODIURIL) 25 MG tablet, Take 1 tablet (25 mg total) by mouth daily., Disp: 30 tablet, Rfl: 6;  imatinib ([name]) 100 MG tablet, Take 2 tablets (200 mg total) by mouth daily. Take with meals and large glass of water.Caution:Chemotherapy., Disp: 30 tablet, Rfl: 6;  lisinopril (PRINIVIL,ZESTRIL) 20 MG tablet, Take 20 mg by mouth daily., Disp: , Rfl:  metoprolol succinate (TOPROL XL) 25 MG 24 hr tablet, Take 1 tablet (25 mg total) by mouth daily., Disp: 30 tablet, Rfl: 11;  Multiple Vitamins-Minerals (MULTIVITAMIN WITH MINERALS) tablet, Take 1 tablet by mouth daily.  , Disp: , Rfl: ;  potassium chloride SA (K-DUR,KLOR-CON) 20 MEQ tablet, Take 20 mEq by mouth as needed. Only takes once or twice weekly, Disp:  , Rfl: ;  Probiotic Product (PROBIOTIC FORMULA PO), Take by mouth daily., Disp: , Rfl:  prochlorperazine (COMPAZINE) 10 MG tablet, Take 10 mg by mouth every 6 (six) hours as needed., Disp: , Rfl: ;  Rivaroxaban (XARELTO) 20 MG TABS tablet, Take 1 tablet (20 mg total) by mouth daily., Disp: 30 tablet, Rfl: 6;  dexlansoprazole (DEXILANT) 60 MG capsule, Take 1 capsule (60 mg total) by mouth daily., Disp: 15 capsule, Rfl: 0  Allergies: No Known Allergies  Past Medical History, Surgical history, Social history, and Family History were reviewed and updated.  Review of Systems: As above  Physical Exam:  height is 5\' 7"  (1.702 m) and weight is 195 lb (88.451 kg). His oral temperature is 98.1 F (36.7 C). His blood pressure is 137/64 and his pulse is 65. His respiration is 18.   Lungs are clear. Cardiac exam regular rate and rhythm. I cannot detect fibrillation and abdomen is soft. There is no palpable liver or spleen. Skin exam no rashes. Extremities shows no clubbing cyanosis or edema. Has good strength in his muscles. His good resolution of his joints. Neurological exam no focal neurological deficits. Lymph node exam shows no palpable lymph nodes.  Lab Results  Component Value Date   WBC 9.0 03/29/2014   HGB 13.1 03/29/2014   HCT 39.7 03/29/2014   MCV 92 03/29/2014   PLT 265 03/29/2014     Chemistry      Component Value Date/Time  NA 139 03/12/2014 0849   NA 143 11/01/2012 1146   K 3.9 03/12/2014 0849   K 3.0* 11/01/2012 1146   CL 103 03/12/2014 0849   CL 104 11/01/2012 1146   CO2 29 03/12/2014 0849   CO2 29 11/01/2012 1146   BUN 15 03/12/2014 0849   BUN 12 11/01/2012 1146   CREATININE 1.28 03/12/2014 0849   CREATININE 1.19 11/28/2013 0953      Component Value Date/Time   CALCIUM 9.5 03/12/2014 0849   CALCIUM 9.3 11/01/2012 1146   ALKPHOS 87 03/12/2014 0849   ALKPHOS 64 11/01/2012 1146   AST 15 03/12/2014 0849   AST 20 11/01/2012 1146   ALT 12 03/12/2014 0849   ALT 8* 11/01/2012 1146   BILITOT 0.4  03/12/2014 0849   BILITOT 0.40 11/01/2012 1146         Impression and Plan: Mr. Shea is a 68 year old gentleman. He has chronic phase CML. He is a major molecular remission.  I think we'll probably follow him up now in 6 months. I think this probably would be appropriate for him. We have been following him down for an about 3 or 4 years.  I do not see any need for a bone marrow test.   Volanda Napoleon, MD 4/3/201511:44 AM

## 2014-03-29 NOTE — Patient Instructions (Signed)
You Can Quit Smoking If you are ready to quit smoking or are thinking about it, congratulations! You have chosen to help yourself be healthier and live longer! There are lots of different ways to quit smoking. Nicotine gum, nicotine patches, a nicotine inhaler, or nicotine nasal spray can help with physical craving. Hypnosis, support groups, and medicines help break the habit of smoking. TIPS TO GET OFF AND STAY OFF CIGARETTES  Learn to predict your moods. Do not let a bad situation be your excuse to have a cigarette. Some situations in your life might tempt you to have a cigarette.  Ask friends and co-workers not to smoke around you.  Make your home smoke-free.  Never have "just one" cigarette. It leads to wanting another and another. Remind yourself of your decision to quit.  On a card, make a list of your reasons for not smoking. Read it at least the same number of times a day as you have a cigarette. Tell yourself everyday, "I do not want to smoke. I choose not to smoke."  Ask someone at home or work to help you with your plan to quit smoking.  Have something planned after you eat or have a cup of coffee. Take a walk or get other exercise to perk you up. This will help to keep you from overeating.  Try a relaxation exercise to calm you down and decrease your stress. Remember, you may be tense and nervous the first two weeks after you quit. This will pass.  Find new activities to keep your hands busy. Play with a pen, coin, or rubber band. Doodle or draw things on paper.  Brush your teeth right after eating. This will help cut down the craving for the taste of tobacco after meals. You can try mouthwash too.  Try gum, breath mints, or diet candy to keep something in your mouth. IF YOU SMOKE AND WANT TO QUIT:  Do not stock up on cigarettes. Never buy a carton. Wait until one pack is finished before you buy another.  Never carry cigarettes with you at work or at home.  Keep cigarettes  as far away from you as possible. Leave them with someone else.  Never carry matches or a lighter with you.  Ask yourself, "Do I need this cigarette or is this just a reflex?"  Bet with someone that you can quit. Put cigarette money in a piggy bank every morning. If you smoke, you give up the money. If you do not smoke, by the end of the week, you keep the money.  Keep trying. It takes 21 days to change a habit!  Talk to your doctor about using medicines to help you quit. These include nicotine replacement gum, lozenges, or skin patches. Document Released: 10/09/2009 Document Revised: 03/06/2012 Document Reviewed: 10/09/2009 [employer] Patient Information 2014 [employer].  

## 2014-04-03 ENCOUNTER — Encounter: Payer: Self-pay | Admitting: *Deleted

## 2014-04-05 ENCOUNTER — Other Ambulatory Visit: Payer: Self-pay | Admitting: Cardiology

## 2014-04-24 ENCOUNTER — Telehealth: Payer: Self-pay | Admitting: Hematology & Oncology

## 2014-04-24 NOTE — Telephone Encounter (Signed)
Pt brought [employer] financial application back today and will be forwarded to: [place]

## 2014-05-24 ENCOUNTER — Other Ambulatory Visit: Payer: Self-pay | Admitting: Nurse Practitioner

## 2014-05-24 DIAGNOSIS — C9210 Chronic myeloid leukemia, BCR/ABL-positive, not having achieved remission: Secondary | ICD-10-CM

## 2014-05-24 MED ORDER — IMATINIB MESYLATE 100 MG PO TABS: 200.0000 mg | ORAL_TABLET | Freq: Every day | ORAL | Status: DC

## 2014-06-11 ENCOUNTER — Other Ambulatory Visit: Payer: Self-pay | Admitting: Family Medicine

## 2014-06-27 ENCOUNTER — Emergency Department (HOSPITAL_BASED_OUTPATIENT_CLINIC_OR_DEPARTMENT_OTHER): Payer: Medicare Other

## 2014-06-27 ENCOUNTER — Emergency Department (HOSPITAL_BASED_OUTPATIENT_CLINIC_OR_DEPARTMENT_OTHER)
Admission: EM | Admit: 2014-06-27 | Discharge: 2014-06-27 | Disposition: A | Discharge status: Home / Self Care | Payer: Medicare Other | Attending: Emergency Medicine | Admitting: Emergency Medicine

## 2014-06-27 ENCOUNTER — Encounter (HOSPITAL_BASED_OUTPATIENT_CLINIC_OR_DEPARTMENT_OTHER): Payer: Self-pay | Admitting: Emergency Medicine

## 2014-06-27 DIAGNOSIS — I10 Essential (primary) hypertension: Secondary | ICD-10-CM | POA: Diagnosis not present

## 2014-06-27 DIAGNOSIS — Z9861 Coronary angioplasty status: Secondary | ICD-10-CM | POA: Diagnosis not present

## 2014-06-27 DIAGNOSIS — F17200 Nicotine dependence, unspecified, uncomplicated: Secondary | ICD-10-CM | POA: Insufficient documentation

## 2014-06-27 DIAGNOSIS — I2510 Atherosclerotic heart disease of native coronary artery without angina pectoris: Secondary | ICD-10-CM | POA: Diagnosis not present

## 2014-06-27 DIAGNOSIS — Z8619 Personal history of other infectious and parasitic diseases: Secondary | ICD-10-CM | POA: Insufficient documentation

## 2014-06-27 DIAGNOSIS — R0789 Other chest pain: Secondary | ICD-10-CM | POA: Diagnosis not present

## 2014-06-27 DIAGNOSIS — I252 Old myocardial infarction: Secondary | ICD-10-CM | POA: Insufficient documentation

## 2014-06-27 DIAGNOSIS — Z79899 Other long term (current) drug therapy: Secondary | ICD-10-CM | POA: Insufficient documentation

## 2014-06-27 DIAGNOSIS — I4891 Unspecified atrial fibrillation: Secondary | ICD-10-CM | POA: Insufficient documentation

## 2014-06-27 DIAGNOSIS — E785 Hyperlipidemia, unspecified: Secondary | ICD-10-CM | POA: Insufficient documentation

## 2014-06-27 DIAGNOSIS — Z8719 Personal history of other diseases of the digestive system: Secondary | ICD-10-CM | POA: Insufficient documentation

## 2014-06-27 DIAGNOSIS — R079 Chest pain, unspecified: Secondary | ICD-10-CM | POA: Diagnosis not present

## 2014-06-27 DIAGNOSIS — R110 Nausea: Secondary | ICD-10-CM

## 2014-06-27 DIAGNOSIS — Z87442 Personal history of urinary calculi: Secondary | ICD-10-CM | POA: Diagnosis not present

## 2014-06-27 DIAGNOSIS — I480 Paroxysmal atrial fibrillation: Secondary | ICD-10-CM

## 2014-06-27 LAB — COMPREHENSIVE METABOLIC PANEL
ALK PHOS: 83 U/L (ref 39–117)
ALT: 12 U/L (ref 0–53)
ANION GAP: 13 (ref 5–15)
AST: 13 U/L (ref 0–37)
Albumin: 3.9 g/dL (ref 3.5–5.2)
BUN: 14 mg/dL (ref 6–23)
CHLORIDE: 104 meq/L (ref 96–112)
CO2: 25 mEq/L (ref 19–32)
Calcium: 9.5 mg/dL (ref 8.4–10.5)
Creatinine, Ser: 1.2 mg/dL (ref 0.50–1.35)
GFR calc non Af Amer: 61 mL/min — ABNORMAL LOW (ref >90)
GFR, EST AFRICAN AMERICAN: 71 mL/min — AB (ref >90)
GLUCOSE: 101 mg/dL — AB (ref 70–99)
Potassium: 3.9 mEq/L (ref 3.7–5.3)
Sodium: 142 mEq/L (ref 137–147)
Total Protein: 6.9 g/dL (ref 6.0–8.3)

## 2014-06-27 LAB — PROTIME-INR
INR: 1.07 (ref 0.00–1.49)
Prothrombin Time: 13.9 seconds (ref 11.6–15.2)

## 2014-06-27 LAB — URINALYSIS, ROUTINE W REFLEX MICROSCOPIC
BILIRUBIN URINE: NEGATIVE
Glucose, UA: NEGATIVE mg/dL
HGB URINE DIPSTICK: NEGATIVE
KETONES UR: NEGATIVE mg/dL
Leukocytes, UA: NEGATIVE
NITRITE: NEGATIVE
PROTEIN: NEGATIVE mg/dL
Specific Gravity, Urine: 1.023 (ref 1.005–1.030)
UROBILINOGEN UA: 0.2 mg/dL (ref 0.0–1.0)
pH: 6 (ref 5.0–8.0)

## 2014-06-27 LAB — CBC WITH DIFFERENTIAL/PLATELET
Basophils Absolute: 0.1 10*3/uL (ref 0.0–0.1)
Basophils Relative: 1 % (ref 0–1)
Eosinophils Absolute: 0.5 10*3/uL (ref 0.0–0.7)
Eosinophils Relative: 5 % (ref 0–5)
HEMATOCRIT: 40.1 % (ref 39.0–52.0)
HEMOGLOBIN: 13.6 g/dL (ref 13.0–17.0)
LYMPHS ABS: 2 10*3/uL (ref 0.7–4.0)
Lymphocytes Relative: 23 % (ref 12–46)
MCH: 31.1 pg (ref 26.0–34.0)
MCHC: 33.9 g/dL (ref 30.0–36.0)
MCV: 91.6 fL (ref 78.0–100.0)
MONOS PCT: 10 % (ref 3–12)
Monocytes Absolute: 0.8 10*3/uL (ref 0.1–1.0)
NEUTROS ABS: 5.3 10*3/uL (ref 1.7–7.7)
Neutrophils Relative %: 61 % (ref 43–77)
Platelets: 207 10*3/uL (ref 150–400)
RBC: 4.38 MIL/uL (ref 4.22–5.81)
RDW: 14.5 % (ref 11.5–15.5)
WBC: 8.6 10*3/uL (ref 4.0–10.5)

## 2014-06-27 LAB — TROPONIN I: Troponin I: <0.3 ng/mL (ref <0.30)

## 2014-06-27 MED ORDER — ONDANSETRON HCL 4 MG/2ML IJ SOLN
4.0000 mg | Freq: Once | INTRAMUSCULAR | Status: AC
  Administered 2014-06-27: 4 mg via INTRAVENOUS
  Filled 2014-06-27: qty 2

## 2014-06-27 MED ORDER — GI COCKTAIL ~~LOC~~
30.0000 mL | Freq: Once | ORAL | Status: AC
  Administered 2014-06-27: 30 mL via ORAL
  Filled 2014-06-27: qty 30

## 2014-06-27 MED ORDER — OMEPRAZOLE 20 MG PO CPDR: 20.0000 mg | DELAYED_RELEASE_CAPSULE | Freq: Every day | ORAL | Status: DC

## 2014-06-27 MED ORDER — ONDANSETRON HCL 4 MG PO TABS: 4.0000 mg | ORAL_TABLET | Freq: Four times a day (QID) | ORAL | Status: DC

## 2014-06-27 NOTE — Discharge Instructions (Signed)
Chest Pain (Nonspecific) There is no evidence of a heart attack or blood clot in the lung. Followup with your doctor this week. Take the stomach and nausea medicine as prescribed. Return to the ED if you develop new or worsening symptoms. It is often hard to give a specific diagnosis for the cause of chest pain. There is always a chance that your pain could be related to something serious, such as a heart attack or a blood clot in the lungs. You need to follow up with your health care provider for further evaluation. CAUSES   Heartburn.  Pneumonia or bronchitis.  Anxiety or stress.  Inflammation around your heart (pericarditis) or lung (pleuritis or pleurisy).  A blood clot in the lung.  A collapsed lung (pneumothorax). It can develop suddenly on its own (spontaneous pneumothorax) or from trauma to the chest.  Shingles infection (herpes zoster virus). The chest wall is composed of bones, muscles, and cartilage. Any of these can be the source of the pain.  The bones can be bruised by injury.  The muscles or cartilage can be strained by coughing or overwork.  The cartilage can be affected by inflammation and become sore (costochondritis). DIAGNOSIS  Lab tests or other studies may be needed to find the cause of your pain. Your health care provider may have you take a test called an ambulatory electrocardiogram (ECG). An ECG records your heartbeat patterns over a 24-hour period. You may also have other tests, such as:  Transthoracic echocardiogram (TTE). During echocardiography, sound waves are used to evaluate how blood flows through your heart.  Transesophageal echocardiogram (TEE).  Cardiac monitoring. This allows your health care provider to monitor your heart rate and rhythm in real time.  Holter monitor. This is a portable device that records your heartbeat and can help diagnose heart arrhythmias. It allows your health care provider to track your heart activity for several days,  if needed.  Stress tests by exercise or by giving medicine that makes the heart beat faster. TREATMENT   Treatment depends on what may be causing your chest pain. Treatment may include:  Acid blockers for heartburn.  Anti-inflammatory medicine.  Pain medicine for inflammatory conditions.  Antibiotics if an infection is present.  You may be advised to change lifestyle habits. This includes stopping smoking and avoiding alcohol, caffeine, and chocolate.  You may be advised to keep your head raised (elevated) when sleeping. This reduces the chance of acid going backward from your stomach into your esophagus. Most of the time, nonspecific chest pain will improve within 2-3 days with rest and mild pain medicine.  HOME CARE INSTRUCTIONS   If antibiotics were prescribed, take them as directed. Finish them even if you start to feel better.  For the next few days, avoid physical activities that bring on chest pain. Continue physical activities as directed.  Do not use any tobacco products, including cigarettes, chewing tobacco, or electronic cigarettes.  Avoid drinking alcohol.  Only take medicine as directed by your health care provider.  Follow your health care provider's suggestions for further testing if your chest pain does not go away.  Keep any follow-up appointments you made. If you do not go to an appointment, you could develop lasting (chronic) problems with pain. If there is any problem keeping an appointment, call to reschedule. SEEK MEDICAL CARE IF:   Your chest pain does not go away, even after treatment.  You have a rash with blisters on your chest.  You have a fever.  SEEK IMMEDIATE MEDICAL CARE IF:   You have increased chest pain or pain that spreads to your arm, neck, jaw, back, or abdomen.  You have shortness of breath.  You have an increasing cough, or you cough up blood.  You have severe back or abdominal pain.  You feel nauseous or vomit.  You have  severe weakness.  You faint.  You have chills. This is an emergency. Do not wait to see if the pain will go away. Get medical help at once. Call your local emergency services (911 in [place] Do not drive yourself to the hospital. MAKE SURE YOU:   Understand these instructions.  Will watch your condition.  Will get help right away if you are not doing well or get worse. Document Released: 09/22/2005 Document Revised: 12/18/2013 Document Reviewed: 07/18/2008 [facility] Patient Information 2015 [place], [place]. This information is not intended to replace advice given to you by your health care provider. Make sure you discuss any questions you have with your health care provider.

## 2014-06-27 NOTE — ED Notes (Signed)
Patient states he has had  Intermittent chest tightness and abdominal pain for the last 3 days.  States the chest pain feels like a tightness and is associated with nausea and lightheadedness.  States he also has a tightness in his stomach and is associated with nausea.

## 2014-06-27 NOTE — ED Provider Notes (Signed)
CSN: [id number]     Arrival date & time 06/27/14  4098 History  This chart was scribed for Ezequiel Essex, MD by Roe Coombs, ED Scribe. The patient was seen in room [place]. Patient's care was started at 9:20 AM.   Chief Complaint  Patient presents with  . Chest Pain    The history is provided by the patient. No language interpreter was used.    HPI Comments: Jason [name] is a 68 y.o. male with history of leukemia in remission, HTN, angioplasty with stent placement x 2, atrial fibrillation, MI who presents to the Emergency Department complaining of intermittent, substernal chest pain that began in the middle of the night. He says that initial episode lasted for 1 hour and that he was already awake when pain began. Pain does not radiate to arms or back. He states that pain is worse when he is lying down, and improved when he sits up. There is associated nausea, dizziness and lightheadedness that began yesterday. Patient says that he had similar chest discomfort in the past with hypokalemia. He states that current symptoms are different from pain associated with previous MIs. His last bowel movement was this morning prior to arrival in the ED. He denies vomiting, shortness of breath, cough, congestion, rhinorrhea, headache, abdominal pain, diarrhea or other symptoms. He sees cardiologist, [name]; his most recent stress test was last year. Patient is currently taking Gleevac and Xarelto.  Past Medical History  Diagnosis Date  . CML (chronic myelocytic leukemia) 12/16/2011  . History of chicken pox   . Hypertension   . Hyperlipidemia   . History of kidney stones   . Coronary heart disease   . Atrial fibrillation   . IBS (irritable bowel syndrome) 09/20/2013  . Grief reaction 03/18/2014  . Medicare annual wellness visit, subsequent 03/23/2014   Past Surgical History  Procedure Laterality Date  . Cholecystectomy  2003  . Coronary angioplasty with stent placement  2000   Family History   Problem Relation Age of Onset  . Arthritis Mother   . Hypertension Mother   . Heart disease Mother     Rheumatic fever  . Rheumatic fever Mother   . Other Mother     brain tumor  . Cancer Mother     leukemia  . Hypertension Father   . Alcohol abuse Father   . Colon cancer Neg Hx   . Breast cancer Neg Hx   . Diabetes Neg Hx   . Prostate cancer Neg Hx    History  Substance Use Topics  . Smoking status: Current Every Day Smoker -- 0.25 packs/day    Types: Cigarettes    Start date: 10/30/1967  . Smokeless tobacco: Never Used     Comment: 03-29-14 still smoking  . Alcohol Use: Yes     Comment: Rare    Review of Systems A complete 10 system review of systems was obtained and all systems are negative except as noted in the HPI and PMH.   Allergies  Review of patient's allergies indicates no known allergies.  Home Medications   Prior to Admission medications   Medication Sig Start Date End Date Taking? Authorizing Provider  acetaminophen (TYLENOL) 325 MG tablet Take 650 mg by mouth every 6 (six) hours as needed. For headache.   Yes Historical Provider, MD  atorvastatin (LIPITOR) 10 MG tablet Take 1 tablet (10 mg total) by mouth daily. 03/18/14 04/01/16 Yes Mosie Lukes, MD  Cholecalciferol (VITAMIN D3) 5000 UNITS CAPS Take  5,000 mg by mouth every morning.   Yes Historical Provider, MD  dexlansoprazole (DEXILANT) 60 MG capsule Take 1 capsule (60 mg total) by mouth daily. 07/06/12 06/27/14 Yes Burnice Logan, MD  hydrochlorothiazide (HYDRODIURIL) 25 MG tablet TAKE ONE TABLET BY MOUTH ONCE DAILY   Yes Mosie Lukes, MD  imatinib ([name]) 100 MG tablet Take 2 tablets (200 mg total) by mouth daily. Take with meals and large glass of water.Caution:Chemotherapy. 05/24/14  Yes Volanda Napoleon, MD  lisinopril (PRINIVIL,ZESTRIL) 20 MG tablet Take 20 mg by mouth daily. 11/05/13  Yes Mosie Lukes, MD  lisinopril (PRINIVIL,ZESTRIL) 20 MG tablet TAKE ONE TABLET BY MOUTH TWICE DAILY   Yes  Mosie Lukes, MD  metoprolol succinate (TOPROL XL) 25 MG 24 hr tablet Take 1 tablet (25 mg total) by mouth daily. 09/20/13 09/20/14 Yes Mosie Lukes, MD  Multiple Vitamins-Minerals (MULTIVITAMIN WITH MINERALS) tablet Take 1 tablet by mouth daily.     Yes Historical Provider, MD  Probiotic Product (PROBIOTIC FORMULA PO) Take by mouth daily.   Yes Historical Provider, MD  prochlorperazine (COMPAZINE) 10 MG tablet Take 10 mg by mouth every 6 (six) hours as needed. 07/28/12  Yes Volanda Napoleon, MD  XARELTO 20 MG TABS tablet TAKE 1 TABLET (20 MG TOTAL) BY MOUTH DAILY. 04/05/14  Yes Lelon Perla, MD  omeprazole (PRILOSEC) 20 MG capsule Take 1 capsule (20 mg total) by mouth daily. 06/27/14   Ezequiel Essex, MD  ondansetron (ZOFRAN) 4 MG tablet Take 1 tablet (4 mg total) by mouth every 6 (six) hours. 06/27/14   Ezequiel Essex, MD   Triage Vitals: BP 140/65  Pulse 66  Temp(Src) 97.8 F (36.6 C) (Oral)  Resp 16  SpO2 97%  Physical Exam  Nursing note and vitals reviewed. Constitutional: He is oriented to person, place, and time. He appears well-developed and well-nourished. No distress.  Appears pale.  HENT:  Head: Normocephalic and atraumatic.  Mouth/Throat: Oropharynx is clear and moist. No oropharyngeal exudate.  Eyes: Conjunctivae and EOM are normal. Pupils are equal, round, and reactive to light.  Neck: Normal range of motion. Neck supple.  No meningismus.  Cardiovascular: Normal rate, regular rhythm, normal heart sounds and intact distal pulses.   No murmur heard. Pulmonary/Chest: Effort normal and breath sounds normal. No respiratory distress.  Abdominal: Soft. There is no tenderness. There is no rebound and no guarding.  Musculoskeletal: Normal range of motion. He exhibits no edema and no tenderness.  Neurological: He is alert and oriented to person, place, and time. No cranial nerve deficit. He exhibits normal muscle tone. Coordination normal.  No ataxia on finger to nose bilaterally.  No pronator drift. 5/5 strength throughout. CN 2-12 intact. Negative Romberg. Equal grip strength. Sensation intact. Gait is normal.   Skin: Skin is warm. There is pallor.  Psychiatric: He has a normal mood and affect. His behavior is normal.    ED Course  Procedures (including critical care time) DIAGNOSTIC STUDIES: Oxygen Saturation is 97% on room air, normal by my interpretation.    COORDINATION OF CARE: 9:27 AM- Patient informed of current plan for treatment and evaluation and agrees with plan at this time.   11:41 AM - Spoke with Dr. Acie Fredrickson one of Dr. Jacalyn Lefevre partners who agreed that symptoms are atypical for acute cardiac disease, but will repeat EKG and troponin. Patient states pain is improved after GI cocktail and Zofran.  Labs Review Labs Reviewed  COMPREHENSIVE METABOLIC PANEL - Abnormal; Notable for  the following:    Glucose, Bld 101 (*)    Total Bilirubin <0.2 (*)    GFR calc non Af Amer 61 (*)    GFR calc Af Amer 71 (*)    All other components within normal limits  CBC WITH DIFFERENTIAL  TROPONIN I  URINALYSIS, ROUTINE W REFLEX MICROSCOPIC  PROTIME-INR  TROPONIN I    Imaging Review Dg Chest 2 View  06/27/2014   CLINICAL DATA:  Chest pain and nausea for 2 days  EXAM: CHEST  2 VIEW  COMPARISON:  June 21, 2011  FINDINGS: The heart size and mediastinal contours are within normal limits. There is no focal infiltrate, pulmonary edema, or pleural effusion. The visualized skeletal structures are unremarkable.  IMPRESSION: No active cardiopulmonary disease.   Electronically Signed   By: Abelardo Diesel M.D.   On: 06/27/2014 09:57     EKG Interpretation   Date/Time:  Thursday June 27 2014 12:28:15 EDT Ventricular Rate:  51 PR Interval:  166 QRS Duration: 142 QT Interval:  484 QTC Calculation: 446 R Axis:   -52 Text Interpretation:  Sinus bradycardia Right bundle branch block Left  anterior fascicular block Minimal voltage criteria for LVH, may be normal  variant  Septal infarct , age undetermined Abnormal ECG No significant  change was found Reconfirmed by Wyvonnia Dusky  MD, Zeniyah Peaster [phone]) on 06/27/2014  3:26:31 PM      MDM   Final diagnoses:  Atypical chest pain  Nausea  Paroxysmal atrial fibrillation   Two-day history of lightheadedness with nausea and intermittent chest tightness. No chest pain currently. EKG with right bundle branch block and left anterior fascicular block.  Patient with constant nausea and lightheadedness since last night associated with intermittent chest tightness. Tightness in his chest has been lasting several hours it waxes and wanes in severity. Chest pain is not exertional not similar to his previous heart attack pain.  Troponin is negative. Labs otherwise unremarkable. Abdomen is soft and nontender.  Stress test results, July 2014 Overall Impression: Low risk stress nuclear study There is a medium sized fixed defect involving the basal inferior and basal inferolateral segments. No reversible ischemia..  LV Ejection Fraction: 53%. LV Wall Motion: NL LV Function; NL Wall Motion  Patient continues to feel nauseated and states that he feels he needs to burp. We'll give GI cocktail. Discussed with Dr. Acie Fredrickson who is covering for [name] who is patient's cardiologist.  Dr. Acie Fredrickson agrees that patient's presentation is atypical for ACS. He recommends repeat EKG and troponin. Patient's main complaint is nausea and lightheadedness with some chest tightness that is better with leaning forward and worse with lying down  Troponin negative x2. Patient feeling well after GI cocktail and tolerating PO On discharge, he is noted to be in atrial fibrillation is rate controlled in the 80s to 100s. He denies any palpitations. He has a history of paroxysmal atrial fibrillation and takes he takes xarelto.  BP 139/71  Pulse 80  Temp(Src) 98.1 F (36.7 C) (Oral)  Resp 16  SpO2 98%  I personally performed the services described in this  documentation, which was scribed in my presence. The recorded information has been reviewed and is accurate.    Ezequiel Essex, MD 06/27/14 [phone]

## 2014-07-01 ENCOUNTER — Encounter (HOSPITAL_BASED_OUTPATIENT_CLINIC_OR_DEPARTMENT_OTHER): Payer: Self-pay | Admitting: Emergency Medicine

## 2014-07-01 ENCOUNTER — Emergency Department (HOSPITAL_BASED_OUTPATIENT_CLINIC_OR_DEPARTMENT_OTHER)
Admission: EM | Admit: 2014-07-01 | Discharge: 2014-07-01 | Disposition: A | Discharge status: Home / Self Care | Payer: Medicare Other | Attending: Emergency Medicine | Admitting: Emergency Medicine

## 2014-07-01 DIAGNOSIS — I2510 Atherosclerotic heart disease of native coronary artery without angina pectoris: Secondary | ICD-10-CM | POA: Diagnosis not present

## 2014-07-01 DIAGNOSIS — Z856 Personal history of leukemia: Secondary | ICD-10-CM | POA: Insufficient documentation

## 2014-07-01 DIAGNOSIS — E7800 Pure hypercholesterolemia, unspecified: Secondary | ICD-10-CM | POA: Insufficient documentation

## 2014-07-01 DIAGNOSIS — F17200 Nicotine dependence, unspecified, uncomplicated: Secondary | ICD-10-CM | POA: Insufficient documentation

## 2014-07-01 DIAGNOSIS — Z79899 Other long term (current) drug therapy: Secondary | ICD-10-CM | POA: Insufficient documentation

## 2014-07-01 DIAGNOSIS — Z8619 Personal history of other infectious and parasitic diseases: Secondary | ICD-10-CM | POA: Diagnosis not present

## 2014-07-01 DIAGNOSIS — Z9861 Coronary angioplasty status: Secondary | ICD-10-CM | POA: Diagnosis not present

## 2014-07-01 DIAGNOSIS — I4891 Unspecified atrial fibrillation: Secondary | ICD-10-CM | POA: Diagnosis not present

## 2014-07-01 DIAGNOSIS — I10 Essential (primary) hypertension: Secondary | ICD-10-CM | POA: Diagnosis not present

## 2014-07-01 DIAGNOSIS — Z8659 Personal history of other mental and behavioral disorders: Secondary | ICD-10-CM | POA: Insufficient documentation

## 2014-07-01 DIAGNOSIS — Z87442 Personal history of urinary calculi: Secondary | ICD-10-CM | POA: Diagnosis not present

## 2014-07-01 DIAGNOSIS — R002 Palpitations: Secondary | ICD-10-CM | POA: Insufficient documentation

## 2014-07-01 DIAGNOSIS — Z7901 Long term (current) use of anticoagulants: Secondary | ICD-10-CM | POA: Diagnosis not present

## 2014-07-01 DIAGNOSIS — Z8719 Personal history of other diseases of the digestive system: Secondary | ICD-10-CM | POA: Diagnosis not present

## 2014-07-01 LAB — BASIC METABOLIC PANEL
Anion gap: 11 (ref 5–15)
BUN: 14 mg/dL (ref 6–23)
CO2: 29 meq/L (ref 19–32)
CREATININE: 1.4 mg/dL — AB (ref 0.50–1.35)
Calcium: 9.4 mg/dL (ref 8.4–10.5)
Chloride: 102 mEq/L (ref 96–112)
GFR calc non Af Amer: 51 mL/min — ABNORMAL LOW (ref >90)
GFR, EST AFRICAN AMERICAN: 59 mL/min — AB (ref >90)
Glucose, Bld: 99 mg/dL (ref 70–99)
POTASSIUM: 3.7 meq/L (ref 3.7–5.3)
Sodium: 142 mEq/L (ref 137–147)

## 2014-07-01 LAB — TROPONIN I: Troponin I: <0.3 ng/mL (ref <0.30)

## 2014-07-01 NOTE — ED Provider Notes (Signed)
CSN: [id number]     Arrival date & time 07/01/14  1211 History   First MD Initiated Contact with Patient 07/01/14 1230     Chief Complaint  Patient presents with  . Palpitations     (Consider location/radiation/quality/duration/timing/severity/associated sxs/prior Treatment) HPI Comments: Patient is a 68 year old male with history of coronary artery disease with stent. He presents today with complaints of palpitations that occurred while he was drinking his coffee. He states that he felt somewhat short of breath and developed a tingling in his left arm. He denies any chest pain or shortness of breath. He was seen here for a similar incident that occurred 4 days ago. He arranged a followup appointment with his cardiologist, [name] for next month.  Patient is a 68 y.o. male presenting with palpitations. The history is provided by the patient.  Palpitations Palpitations quality:  Irregular Onset quality:  Sudden Duration:  1 hour Timing:  Intermittent Chronicity:  New Relieved by:  Nothing Worsened by:  Nothing tried Ineffective treatments:  None tried   Past Medical History  Diagnosis Date  . CML (chronic myelocytic leukemia) 12/16/2011  . History of chicken pox   . Hypertension   . Hyperlipidemia   . History of kidney stones   . Coronary heart disease   . Atrial fibrillation   . IBS (irritable bowel syndrome) 09/20/2013  . Grief reaction 03/18/2014  . Medicare annual wellness visit, subsequent 03/23/2014   Past Surgical History  Procedure Laterality Date  . Cholecystectomy  2003  . Coronary angioplasty with stent placement  2000   Family History  Problem Relation Age of Onset  . Arthritis Mother   . Hypertension Mother   . Heart disease Mother     Rheumatic fever  . Rheumatic fever Mother   . Other Mother     brain tumor  . Cancer Mother     leukemia  . Hypertension Father   . Alcohol abuse Father   . Colon cancer Neg Hx   . Breast cancer Neg Hx   . Diabetes  Neg Hx   . Prostate cancer Neg Hx    History  Substance Use Topics  . Smoking status: Current Every Day Smoker -- 0.50 packs/day    Types: Cigarettes    Start date: 10/30/1967  . Smokeless tobacco: Never Used     Comment: 03-29-14 still smoking  . Alcohol Use: Yes     Comment: Rare    Review of Systems  Cardiovascular: Positive for palpitations.  All other systems reviewed and are negative.     Allergies  Review of patient's allergies indicates no known allergies.  Home Medications   Prior to Admission medications   Medication Sig Start Date End Date Taking? Authorizing Provider  acetaminophen (TYLENOL) 325 MG tablet Take 650 mg by mouth every 6 (six) hours as needed. For headache.    Historical Provider, MD  atorvastatin (LIPITOR) 10 MG tablet Take 1 tablet (10 mg total) by mouth daily. 03/18/14 04/01/16  Mosie Lukes, MD  Cholecalciferol (VITAMIN D3) 5000 UNITS CAPS Take 5,000 mg by mouth every morning.    Historical Provider, MD  dexlansoprazole (DEXILANT) 60 MG capsule Take 1 capsule (60 mg total) by mouth daily. 07/06/12 06/27/14  Burnice Logan, MD  hydrochlorothiazide (HYDRODIURIL) 25 MG tablet TAKE ONE TABLET BY MOUTH ONCE DAILY    Mosie Lukes, MD  imatinib ([name]) 100 MG tablet Take 2 tablets (200 mg total) by mouth daily. Take with meals and large  glass of water.Caution:Chemotherapy. 05/24/14   Volanda Napoleon, MD  lisinopril (PRINIVIL,ZESTRIL) 20 MG tablet Take 20 mg by mouth daily. 11/05/13   Mosie Lukes, MD  lisinopril (PRINIVIL,ZESTRIL) 20 MG tablet TAKE ONE TABLET BY MOUTH TWICE DAILY    Mosie Lukes, MD  metoprolol succinate (TOPROL XL) 25 MG 24 hr tablet Take 1 tablet (25 mg total) by mouth daily. 09/20/13 09/20/14  Mosie Lukes, MD  Multiple Vitamins-Minerals (MULTIVITAMIN WITH MINERALS) tablet Take 1 tablet by mouth daily.      Historical Provider, MD  omeprazole (PRILOSEC) 20 MG capsule Take 1 capsule (20 mg total) by mouth daily. 06/27/14   Ezequiel Essex, MD  ondansetron (ZOFRAN) 4 MG tablet Take 1 tablet (4 mg total) by mouth every 6 (six) hours. 06/27/14   Ezequiel Essex, MD  Probiotic Product (PROBIOTIC FORMULA PO) Take by mouth daily.    Historical Provider, MD  prochlorperazine (COMPAZINE) 10 MG tablet Take 10 mg by mouth every 6 (six) hours as needed. 07/28/12   Volanda Napoleon, MD  XARELTO 20 MG TABS tablet TAKE 1 TABLET (20 MG TOTAL) BY MOUTH DAILY. 04/05/14   Lelon Perla, MD   BP 125/73  Pulse 56  Temp(Src) 99.1 F (37.3 C) (Oral)  Resp 14  Ht 5\' 8"  (1.727 m)  Wt 196 lb (88.905 kg)  BMI 29.81 kg/m2  SpO2 100% Physical Exam  Nursing note and vitals reviewed. Constitutional: He is oriented to person, place, and time. He appears well-developed and well-nourished. No distress.  HENT:  Head: Normocephalic and atraumatic.  Mouth/Throat: Oropharynx is clear and moist.  Neck: Normal range of motion. Neck supple.  Cardiovascular: Normal rate, regular rhythm and normal heart sounds.   No murmur heard. Pulmonary/Chest: Effort normal and breath sounds normal. No respiratory distress. He has no wheezes.  Abdominal: Soft. Bowel sounds are normal. He exhibits no distension. There is no tenderness.  Musculoskeletal: Normal range of motion. He exhibits no edema.  Neurological: He is alert and oriented to person, place, and time.  Skin: Skin is warm and dry. He is not diaphoretic.    ED Course  Procedures (including critical care time) Labs Review Labs Reviewed  BASIC METABOLIC PANEL  TROPONIN I    Imaging Review No results found.   Date: 07/01/2014  Rate: 57  Rhythm: sinus bradycardia  QRS Axis: normal  Intervals: normal  ST/T Wave abnormalities: normal  Conduction Disutrbances:none  Narrative Interpretation:   Old EKG Reviewed: unchanged    MDM   Final diagnoses:  None    Workup reveals an unchanged EKG, negative troponin, and electrolyte panel that is unremarkable. I discussed the case with Dr. Harrington Challenger from  cardiology who will make arrangements for the patient to receive expedited followup. There appears to be no emergent situation and I believe the patient is appropriate for discharge.    Veryl Speak, MD 07/01/14 [id number]

## 2014-07-01 NOTE — ED Notes (Signed)
Palpitations that started at 0700 while drinking coffee, SOB, and tingling in left arm that lasted a minute.  Palpitations have neen intermittent today.  Seen for similar symptoms 06/28/2015.

## 2014-07-01 NOTE — Discharge Instructions (Signed)
Dr. Jacalyn Lefevre office will call in the near future to arrange an expedited followup appointment.  Return to the emergency department if you develop chest pain, difficulty breathing, or other new and concerning symptoms.   Palpitations A palpitation is the feeling that your heartbeat is irregular or is faster than normal. It may feel like your heart is fluttering or skipping a beat. Palpitations are usually not a serious problem. However, in some cases, you may need further medical evaluation. CAUSES  Palpitations can be caused by:  Smoking.  Caffeine or other stimulants, such as diet pills or energy drinks.  Alcohol.  Stress and anxiety.  Strenuous physical activity.  Fatigue.  Certain medicines.  Heart disease, especially if you have a history of irregular heart rhythms (arrhythmias), such as atrial fibrillation, atrial flutter, or supraventricular tachycardia.  An improperly working pacemaker or defibrillator. DIAGNOSIS  To find the cause of your palpitations, your health care provider will take your medical history and perform a physical exam. Your health care provider may also have you take a test called an ambulatory electrocardiogram (ECG). An ECG records your heartbeat patterns over a 24-hour period. You may also have other tests, such as:  Transthoracic echocardiogram (TTE). During echocardiography, sound waves are used to evaluate how blood flows through your heart.  Transesophageal echocardiogram (TEE).  Cardiac monitoring. This allows your health care provider to monitor your heart rate and rhythm in real time.  Holter monitor. This is a portable device that records your heartbeat and can help diagnose heart arrhythmias. It allows your health care provider to track your heart activity for several days, if needed.  Stress tests by exercise or by giving medicine that makes the heart beat faster. TREATMENT  Treatment of palpitations depends on the cause of your  symptoms and can vary greatly. Most cases of palpitations do not require any treatment other than time, relaxation, and monitoring your symptoms. Other causes, such as atrial fibrillation, atrial flutter, or supraventricular tachycardia, usually require further treatment. HOME CARE INSTRUCTIONS   Avoid:  Caffeinated coffee, tea, soft drinks, diet pills, and energy drinks.  Chocolate.  Alcohol.  Stop smoking if you smoke.  Reduce your stress and anxiety. Things that can help you relax include:  A method of controlling things in your body, such as your heartbeats, with your mind (biofeedback).  Yoga.  Meditation.  Physical activity such as swimming, jogging, or walking.  Get plenty of rest and sleep. SEEK MEDICAL CARE IF:   You continue to have a fast or irregular heartbeat beyond 24 hours.  Your palpitations occur more often. SEEK IMMEDIATE MEDICAL CARE IF:  You have chest pain or shortness of breath.  You have a severe headache.  You feel dizzy or you faint. MAKE SURE YOU:  Understand these instructions.  Will watch your condition.  Will get help right away if you are not doing well or get worse. Document Released: 12/10/2000 Document Revised: 12/18/2013 Document Reviewed: 02/11/2012 [facility] Patient Information 2015 [place], [place]. This information is not intended to replace advice given to you by your health care provider. Make sure you discuss any questions you have with your health care provider.

## 2014-07-08 ENCOUNTER — Ambulatory Visit: Payer: Medicare Other | Admitting: Physician Assistant

## 2014-07-15 ENCOUNTER — Ambulatory Visit (INDEPENDENT_AMBULATORY_CARE_PROVIDER_SITE_OTHER): Payer: Medicare Other | Admitting: Cardiology

## 2014-07-15 ENCOUNTER — Encounter: Payer: Self-pay | Admitting: Cardiology

## 2014-07-15 ENCOUNTER — Encounter (HOSPITAL_COMMUNITY): Payer: Self-pay | Admitting: *Deleted

## 2014-07-15 VITALS — BP 129/83 | HR 60 | Ht 68.0 in | Wt 194.0 lb

## 2014-07-15 DIAGNOSIS — I4891 Unspecified atrial fibrillation: Secondary | ICD-10-CM

## 2014-07-15 DIAGNOSIS — Z7901 Long term (current) use of anticoagulants: Secondary | ICD-10-CM | POA: Diagnosis not present

## 2014-07-15 DIAGNOSIS — R079 Chest pain, unspecified: Secondary | ICD-10-CM | POA: Diagnosis not present

## 2014-07-15 DIAGNOSIS — I480 Paroxysmal atrial fibrillation: Secondary | ICD-10-CM

## 2014-07-15 DIAGNOSIS — C9210 Chronic myeloid leukemia, BCR/ABL-positive, not having achieved remission: Secondary | ICD-10-CM

## 2014-07-15 DIAGNOSIS — I10 Essential (primary) hypertension: Secondary | ICD-10-CM

## 2014-07-15 DIAGNOSIS — Z8673 Personal history of transient ischemic attack (TIA), and cerebral infarction without residual deficits: Secondary | ICD-10-CM

## 2014-07-15 MED ORDER — NITROGLYCERIN 0.4 MG SL SUBL: 0.4000 mg | SUBLINGUAL_TABLET | SUBLINGUAL | Status: DC | PRN

## 2014-07-15 NOTE — Assessment & Plan Note (Signed)
Embolic by MRI [id number]

## 2014-07-15 NOTE — Assessment & Plan Note (Signed)
Controlled.  

## 2014-07-15 NOTE — Assessment & Plan Note (Signed)
PAF first noted in 2013

## 2014-07-15 NOTE — Assessment & Plan Note (Signed)
Vague symptoms, he has not taken NTG

## 2014-07-15 NOTE — Assessment & Plan Note (Signed)
Xarelto

## 2014-07-15 NOTE — Patient Instructions (Signed)
Your physician recommends that you schedule a follow-up appointment After Stress Test  Your physician has requested that you have en exercise stress myoview. For further information please visit [email or web address]. Please follow instruction sheet, as given.

## 2014-07-15 NOTE — Assessment & Plan Note (Signed)
Followed by Dr. Ennever 

## 2014-07-15 NOTE — Progress Notes (Signed)
07/15/2014 Jason [name]   11-Jan-1946  [id number]  Primary Physicia Penni Homans, MD Primary Cardiologist: Dr Stanford Breed  HPI:  68 y/o followed by Dr Stanford Breed with a history of CAD s/p prior stenting by Dr Elonda Husky in [facility] in 2000. He had a low risk Myoview in July 2014. He was seen in the ER 06/27/14 with vagues chest discomfort. He ruled out for an MI and his symptoms were felt to be non cardiac. He went back to the ER 07/01/14 for similar symptoms. He describes a vague "pressure" in his chest. This is associated with nausea. He has also had "irregular heart beat" associated with dizziness. 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   Current Outpatient Prescriptions  Medication Sig Dispense Refill  . acetaminophen (TYLENOL) 325 MG tablet Take 650 mg by mouth every 6 (six) hours as needed. For headache.      Marland Kitchen atorvastatin (LIPITOR) 10 MG tablet Take 1 tablet (10 mg total) by mouth daily.  30 tablet  5  . Cholecalciferol (VITAMIN D3) 5000 UNITS CAPS Take 5,000 mg by mouth every morning.      . hydrochlorothiazide (HYDRODIURIL) 25 MG tablet TAKE ONE TABLET BY MOUTH ONCE DAILY  30 tablet  0  . imatinib ([name]) 100 MG tablet Take 2 tablets (200 mg total) by mouth daily. Take with meals and large glass of water.Caution:Chemotherapy.  30 tablet  6  . lisinopril (PRINIVIL,ZESTRIL) 20 MG tablet TAKE ONE TABLET BY MOUTH TWICE DAILY  60 tablet  0  . metoprolol succinate (TOPROL XL) 25 MG 24 hr tablet Take 1 tablet (25 mg total) by mouth daily.  30 tablet  11  . Multiple Vitamins-Minerals (MULTIVITAMIN WITH MINERALS) tablet Take 1 tablet by mouth daily.        Marland Kitchen omeprazole (PRILOSEC) 20 MG capsule Take 1 capsule (20 mg total) by mouth daily.  30 capsule  0  . ondansetron (ZOFRAN) 4 MG tablet Take 1 tablet (4 mg total) by mouth every 6 (six) hours.  12 tablet  0  . Probiotic Product (PROBIOTIC FORMULA PO) Take by mouth daily.      . prochlorperazine (COMPAZINE)  10 MG tablet Take 10 mg by mouth every 6 (six) hours as needed.      [name] 20 MG TABS tablet TAKE 1 TABLET (20 MG TOTAL) BY MOUTH DAILY.  30 tablet  3   No current facility-administered medications for this visit.    No Known Allergies  History   Social History  . Marital Status: Married    Spouse Name: N/A    Number of Children: 3  . Years of Education: N/A   Occupational History  .      Retired   Social History Main Topics  . Smoking status: Current Every Day Smoker -- 0.50 packs/day    Types: Cigarettes    Start date: 10/30/1967  . Smokeless tobacco: Never Used     Comment: 03-29-14 still smoking  . Alcohol Use: Yes     Comment: Rare  . Drug Use: No  . Sexual Activity: No     Comment: wife died in 20-Jan-2023 married 24 years    Other Topics Concern  . Not on file   Social History Narrative  . No narrative on file     Review of Systems: General: negative for chills, fever, night sweats or weight changes.  Cardiovascular: negative for, dyspnea on exertion, edema, orthopnea, palpitations,  paroxysmal nocturnal dyspnea or shortness of breath Dermatological: negative for rash Respiratory: negative for cough or wheezing Urologic: negative for hematuria Abdominal: negative for nausea, vomiting, diarrhea, bright red blood per rectum, melena, or hematemesis Neurologic: negative for visual changes, syncope, or dizziness All other systems reviewed and are otherwise negative except as noted above.    Blood pressure 129/83, pulse 60, height 5\' 8"  (1.727 m), weight 194 lb (87.998 kg).  General appearance: alert, cooperative and no distress Neck: no carotid bruit and no JVD Lungs: clear to auscultation bilaterally Heart: regular rate and rhythm Extremities: no edema  EKG NSR, SB, incomplete RBBB  ASSESSMENT AND PLAN:   Chest pain with moderate risk of acute coronary syndrome Seen in the ER twice this month  CAD- prior stents 2000 by Dr Elonda Husky in [place] Vague symptoms, he  has not taken NTG  PAF (paroxysmal atrial fibrillation) PAF first noted in 2013  Chronic anticoagulation Xarelto  HTN (hypertension) Controlled  CML (chronic myelocytic leukemia) Followed by Dr Marin Olp  History of CVA (cerebrovascular accident) Embolic by MRI 8502   PLAN  I suggested we proceed with a an exercise Myoview. I also gave him an Rx for NTG sl. He will follow up with Dr Stanford Breed after his stress test.   Kerin Ransom KPA-C 07/15/2014 9:33 AM

## 2014-07-15 NOTE — Assessment & Plan Note (Signed)
Seen in the ER twice this month

## 2014-07-19 ENCOUNTER — Telehealth (HOSPITAL_COMMUNITY): Payer: Self-pay

## 2014-07-19 NOTE — Telephone Encounter (Signed)
Encounter complete. 

## 2014-07-22 ENCOUNTER — Other Ambulatory Visit: Payer: Self-pay | Admitting: Family Medicine

## 2014-07-24 ENCOUNTER — Ambulatory Visit (HOSPITAL_COMMUNITY)
Admission: RE | Admit: 2014-07-24 | Discharge: 2014-07-24 | Disposition: A | Discharge status: Home / Self Care | Payer: Medicare Other | Source: Ambulatory Visit | Attending: Cardiovascular Disease | Admitting: Cardiovascular Disease

## 2014-07-24 DIAGNOSIS — Z951 Presence of aortocoronary bypass graft: Secondary | ICD-10-CM | POA: Insufficient documentation

## 2014-07-24 DIAGNOSIS — E669 Obesity, unspecified: Secondary | ICD-10-CM | POA: Diagnosis not present

## 2014-07-24 DIAGNOSIS — F17200 Nicotine dependence, unspecified, uncomplicated: Secondary | ICD-10-CM | POA: Diagnosis not present

## 2014-07-24 DIAGNOSIS — I10 Essential (primary) hypertension: Secondary | ICD-10-CM | POA: Insufficient documentation

## 2014-07-24 DIAGNOSIS — I2510 Atherosclerotic heart disease of native coronary artery without angina pectoris: Secondary | ICD-10-CM | POA: Insufficient documentation

## 2014-07-24 DIAGNOSIS — R079 Chest pain, unspecified: Secondary | ICD-10-CM

## 2014-07-24 DIAGNOSIS — Z9861 Coronary angioplasty status: Secondary | ICD-10-CM | POA: Insufficient documentation

## 2014-07-24 MED ORDER — TECHNETIUM TC 99M SESTAMIBI GENERIC - CARDIOLITE
10.9000 | Freq: Once | INTRAVENOUS | Status: AC | PRN
  Administered 2014-07-24: 10.9 via INTRAVENOUS

## 2014-07-24 MED ORDER — TECHNETIUM TC 99M SESTAMIBI GENERIC - CARDIOLITE
31.2000 | Freq: Once | INTRAVENOUS | Status: AC | PRN
  Administered 2014-07-24: 31.2 via INTRAVENOUS

## 2014-07-24 NOTE — Procedures (Addendum)
[facility] [address] [place] [address] [phone]  Cardiology Nuclear Med Study  Jason [name] is a 68 y.o. male     MRN : [id number]     DOB: April 25, 1946  Procedure Date: 07/24/2014  Nuclear Med Background Indication for Stress Test:  Stent Patency and [facility] History:  CAD;STENT/PTCA-2000;PAF;Last NUC MPI on 07/04/2013-EF=53%;ECHO on 10/02/2012 Cardiac Risk Factors: Hypertension, Lipids, Overweight, RBBB and Smoker  Symptoms:  Chest Pain, Dizziness, DOE, Nausea and Palpitations   Nuclear Pre-Procedure Caffeine/Decaff Intake:  8:00pm NPO After: 6:00am   IV Site: R Hand  IV 0.9% NS with Angio Cath:  22g  Chest Size (in):  46"  IV Started by: Rolene Course, RN  Height: 5\' 8"  (1.727 m)  Cup Size: n/a  BMI:  Body mass index is 29.5 kg/(m^2). Weight:  194 lb (87.998 kg)   Tech Comments:  n/a    Nuclear Med Study 1 or 2 day study: 1 day  Stress Test Type:  Stress  Order Authorizing Provider:  Kirk Ruths, MD   Resting Radionuclide: Technetium 43m Sestamibi  Resting Radionuclide Dose: 10.9 mCi   Stress Radionuclide:  Technetium 39m Sestamibi  Stress Radionuclide Dose: 31.2 mCi           Stress Protocol Rest HR: 69 Stress HR: 134  Rest BP: 130/79 Stress BP: 174/82  Exercise Time (min): 8:22 METS: 7.0   Predicted Max HR: 154 bpm % Max HR: 87.01 bpm Rate Pressure Product: 23316  Dose of Adenosine (mg):  n/a Dose of Lexiscan: n/a mg  Dose of Atropine (mg): n/a Dose of Dobutamine: n/a mcg/kg/min (at max HR)  Stress Test Technologist: Leane Para, CCT Nuclear Technologist: Imagene Riches, CNMT   Rest Procedure:  Myocardial perfusion imaging was performed at rest 45 minutes following the intravenous administration of Technetium 64m Sestamibi. Stress Procedure:  The patient performed treadmill exercise using a Bruce  Protocol for 8:22 minutes. The patient stopped due to Marked SOB, Leg Discomfort and  general fatigue and denied any chest pain.  There were no significant ST-T wave changes.  Technetium 40m Sestamibi was injected Iv at peak exercise and myocardial perfusion imaging was performed after a brief delay.  Transient Ischemic Dilatation (Normal <1.22): 0.90 QGS EDV:  128 ml QGS ESV:  68 ml LV Ejection Fraction: 47%  Rest ECG: NSR-RBBB  Stress ECG: No significant ST segment change suggestive of ischemia.  QPS Raw Data Images:  There is interference from nuclear activity from structures below the diaphragm. This does not affect the ability to read the study. Stress Images:  Decreased inferoseptal perfusion Rest Images:  Decreased inferoseptal perfusion Subtraction (SDS):  No evidence of ischemia.  Impression Exercise Capacity:  Fair exercise capacity. BP Response:  Normal blood pressure response. Clinical Symptoms:  The exercise was limited by leg/calf pain, ?claudication. ECG Impression:  No significant ST segment change suggestive of ischemia. Comparison with Prior Nuclear Study: No significant change from previous study  Overall Impression:  Intermediate risk stress nuclear study with large fixed inferolateral defect suggestive of scar. No reversible ischemia.  LV Wall Motion:  Inferoseptal hypokinesis. EF 47%.  Pixie Casino, MD, [facility] Board Certified in Nuclear Cardiology Attending Cardiologist [place], MD  07/24/2014 1:16 PM

## 2014-07-26 ENCOUNTER — Encounter: Payer: Self-pay | Admitting: *Deleted

## 2014-07-26 ENCOUNTER — Ambulatory Visit (HOSPITAL_BASED_OUTPATIENT_CLINIC_OR_DEPARTMENT_OTHER): Payer: Medicare Other | Admitting: Family

## 2014-07-26 ENCOUNTER — Encounter: Payer: Self-pay | Admitting: Family

## 2014-07-26 ENCOUNTER — Other Ambulatory Visit (HOSPITAL_BASED_OUTPATIENT_CLINIC_OR_DEPARTMENT_OTHER): Payer: Medicare Other | Admitting: Lab

## 2014-07-26 VITALS — BP 116/52 | HR 55 | Temp 97.6°F | Resp 18 | Ht 68.0 in | Wt 191.0 lb

## 2014-07-26 DIAGNOSIS — C9210 Chronic myeloid leukemia, BCR/ABL-positive, not having achieved remission: Secondary | ICD-10-CM

## 2014-07-26 LAB — CBC WITH DIFFERENTIAL (CANCER CENTER ONLY)
BASO#: 0.1 10*3/uL (ref 0.0–0.2)
BASO%: 1 % (ref 0.0–2.0)
EOS%: 5.3 % (ref 0.0–7.0)
Eosinophils Absolute: 0.5 10*3/uL (ref 0.0–0.5)
HCT: 41.2 % (ref 38.7–49.9)
HEMOGLOBIN: 13.8 g/dL (ref 13.0–17.1)
LYMPH#: 2.4 10*3/uL (ref 0.9–3.3)
LYMPH%: 26.2 % (ref 14.0–48.0)
MCH: 31 pg (ref 28.0–33.4)
MCHC: 33.5 g/dL (ref 32.0–35.9)
MCV: 93 fL (ref 82–98)
MONO#: 0.7 10*3/uL (ref 0.1–0.9)
MONO%: 8 % (ref 0.0–13.0)
NEUT%: 59.5 % (ref 40.0–80.0)
NEUTROS ABS: 5.4 10*3/uL (ref 1.5–6.5)
PLATELETS: 222 10*3/uL (ref 145–400)
RBC: 4.45 10*6/uL (ref 4.20–5.70)
RDW: 14.4 % (ref 11.1–15.7)
WBC: 9.1 10*3/uL (ref 4.0–10.0)

## 2014-07-26 LAB — COMPREHENSIVE METABOLIC PANEL
ALBUMIN: 4.4 g/dL (ref 3.5–5.2)
ALT: 18 U/L (ref 0–53)
AST: 16 U/L (ref 0–37)
Alkaline Phosphatase: 78 U/L (ref 39–117)
BUN: 12 mg/dL (ref 6–23)
CO2: 26 meq/L (ref 19–32)
Calcium: 9.3 mg/dL (ref 8.4–10.5)
Chloride: 105 mEq/L (ref 96–112)
Creatinine, Ser: 1.33 mg/dL (ref 0.50–1.35)
Glucose, Bld: 88 mg/dL (ref 70–99)
POTASSIUM: 4.4 meq/L (ref 3.5–5.3)
SODIUM: 139 meq/L (ref 135–145)
TOTAL PROTEIN: 7 g/dL (ref 6.0–8.3)
Total Bilirubin: 0.4 mg/dL (ref 0.2–1.2)

## 2014-07-26 LAB — LACTATE DEHYDROGENASE: LDH: 128 U/L (ref 94–250)

## 2014-07-26 MED ORDER — IMATINIB MESYLATE 100 MG PO TABS: 200.0000 mg | ORAL_TABLET | Freq: Every day | ORAL | Status: DC

## 2014-07-26 NOTE — Progress Notes (Signed)
Hematology and Oncology Follow Up Visit  Jason [name] [id number] [birth year] 68 y.o. 07/26/2014   Principle Diagnosis:   Chronic phase CML  Paroxysmal atrial fibrillation  Current Therapy:    [name] 200 mg by mouth daily  Xarelto 20 mg by mouth daily     Interim History:  Mr.  Jason [name] is doing pretty well. He's had no problems with his heart. He's had no issues with the [place]. His last BCR/ABL NOSs it was non-detectable.  He's had no swelling in the abdomen. He's had no leg swelling. He's had no rashes.  He is still smoking. He's trying to cut back on smoking.  He's had no bleeding. There's been no change in bowel or bladder habits.  Overall, his performance status is he can't 1.  Has been 6 months since his wife passed away. He is managing. He still has some tough days.  Medications: Current outpatient prescriptions:acetaminophen (TYLENOL) 325 MG tablet, Take 650 mg by mouth every 6 (six) hours as needed. For headache., Disp: , Rfl: ;  atorvastatin (LIPITOR) 10 MG tablet, Take 1 tablet (10 mg total) by mouth daily., Disp: 30 tablet, Rfl: 5;  Cholecalciferol (VITAMIN D3) 5000 UNITS CAPS, Take 5,000 mg by mouth every morning., Disp: , Rfl:  hydrochlorothiazide (HYDRODIURIL) 25 MG tablet, TAKE ONE TABLET BY MOUTH ONCE DAILY, Disp: 30 tablet, Rfl: 0;  imatinib ([name]) 100 MG tablet, Take 2 tablets (200 mg total) by mouth daily. Take with meals and large glass of water.Caution:Chemotherapy., Disp: 60 tablet, Rfl: 6;  lisinopril (PRINIVIL,ZESTRIL) 20 MG tablet, TAKE ONE TABLET BY MOUTH TWICE DAILY, Disp: 60 tablet, Rfl: 0 metoprolol succinate (TOPROL XL) 25 MG 24 hr tablet, Take 1 tablet (25 mg total) by mouth daily., Disp: 30 tablet, Rfl: 11;  Multiple Vitamins-Minerals (MULTIVITAMIN WITH MINERALS) tablet, Take 1 tablet by mouth daily.  , Disp: , Rfl: ;  nitroGLYCERIN (NITROSTAT) 0.4 MG SL tablet, Place 1 tablet (0.4 mg total) under the tongue every 5 (five) minutes as needed for chest  pain., Disp: 25 tablet, Rfl: 2 omeprazole (PRILOSEC) 20 MG capsule, Take 1 capsule (20 mg total) by mouth daily., Disp: 30 capsule, Rfl: 0;  Probiotic Product (PROBIOTIC FORMULA PO), Take by mouth daily., Disp: , Rfl: ;  prochlorperazine (COMPAZINE) 10 MG tablet, Take 10 mg by mouth every 6 (six) hours as needed., Disp: , Rfl: ;  UNKNOWN TO PATIENT, PT UNSURE ABOUT SOME MEDICATIONS GIVEN  LIST FOR HIM TO COMPARE WITH HOME MEDS AND LET us KNOW 07-26-14, Disp: , Rfl:  XARELTO 20 MG TABS tablet, TAKE 1 TABLET (20 MG TOTAL) BY MOUTH DAILY., Disp: 30 tablet, Rfl: 3;  ondansetron (ZOFRAN) 4 MG tablet, Take 1 tablet (4 mg total) by mouth every 6 (six) hours., Disp: 12 tablet, Rfl: 0  Allergies: No Known Allergies  Past Medical History, Surgical history, Social history, and Family History were reviewed and updated.  Review of Systems: As above  Physical Exam:  height is 5\' 8"  (1.727 m) and weight is 191 lb (86.637 kg). His oral temperature is 97.6 F (36.4 C). His blood pressure is 116/52 and his pulse is 55. His respiration is 18.   Well-developed and well-nourished white woman. Head and neck exam does not her or oral lesions. He has no palpable cervical or supraclavicular lymph nodes. Lungs are clear bilaterally. Cardiac exam regular rate and rhythm with a normal S1 and S2. There are no murmurs rubs or bruits. Abdomen is soft. She has good bowel sounds. There  is no palpable abdominal mass. There is no palpable liver or spleen tip. Back exam shows no tenderness over the spine ribs or hips. Extremities shows no clubbing cyanosis or edema. Skin exam no rashes, ecchymosis or petechia.  Lab Results  Component Value Date   WBC 9.1 07/26/2014   HGB 13.8 07/26/2014   HCT 41.2 07/26/2014   MCV 93 07/26/2014   PLT 222 07/26/2014     Chemistry      Component Value Date/Time   NA 142 07/01/2014 1230   NA 143 11/01/2012 1146   K 3.7 07/01/2014 1230   K 3.0* 11/01/2012 1146   CL 102 07/01/2014 1230   CL 104 11/01/2012  1146   CO2 29 07/01/2014 1230   CO2 29 11/01/2012 1146   BUN 14 07/01/2014 1230   BUN 12 11/01/2012 1146   CREATININE 1.40* 07/01/2014 1230   CREATININE 1.28 03/12/2014 0849      Component Value Date/Time   CALCIUM 9.4 07/01/2014 1230   CALCIUM 9.3 11/01/2012 1146   ALKPHOS 83 06/27/2014 0930   ALKPHOS 64 11/01/2012 1146   AST 13 06/27/2014 0930   AST 20 11/01/2012 1146   ALT 12 06/27/2014 0930   ALT 8* 11/01/2012 1146   BILITOT <0.2* 06/27/2014 0930   BILITOT 0.40 11/01/2012 1146         Impression and Plan: Mr. Kepner is a 68 year old gentleman with chronic phase CML. He is in remission by his last analysis. He is only on 200 mg a day.  We will continue him on the [place].  I will plan to get him back in 4 or 5 months now. I really believe that he should be stable.   Volanda Napoleon, MD 7/31/201511:02 AM

## 2014-07-26 NOTE — Progress Notes (Signed)
This encounter was created in error - please disregard.

## 2014-08-07 ENCOUNTER — Encounter: Payer: Self-pay | Admitting: Cardiology

## 2014-08-07 ENCOUNTER — Ambulatory Visit (INDEPENDENT_AMBULATORY_CARE_PROVIDER_SITE_OTHER): Payer: Medicare Other | Admitting: Cardiology

## 2014-08-07 ENCOUNTER — Encounter: Payer: Medicare Other | Admitting: Cardiology

## 2014-08-07 VITALS — BP 108/56 | HR 59 | Ht 68.0 in | Wt 191.4 lb

## 2014-08-07 DIAGNOSIS — E785 Hyperlipidemia, unspecified: Secondary | ICD-10-CM

## 2014-08-07 DIAGNOSIS — I4891 Unspecified atrial fibrillation: Secondary | ICD-10-CM | POA: Diagnosis not present

## 2014-08-07 DIAGNOSIS — I10 Essential (primary) hypertension: Secondary | ICD-10-CM

## 2014-08-07 DIAGNOSIS — R0989 Other specified symptoms and signs involving the circulatory and respiratory systems: Secondary | ICD-10-CM | POA: Diagnosis not present

## 2014-08-07 DIAGNOSIS — I2510 Atherosclerotic heart disease of native coronary artery without angina pectoris: Secondary | ICD-10-CM | POA: Diagnosis not present

## 2014-08-07 DIAGNOSIS — I480 Paroxysmal atrial fibrillation: Secondary | ICD-10-CM

## 2014-08-07 MED ORDER — RIVAROXABAN 20 MG PO TABS: 20.0000 mg | ORAL_TABLET | Freq: Every day | ORAL | Status: DC

## 2014-08-07 NOTE — Assessment & Plan Note (Signed)
Continue beta blocker and xarelto.  

## 2014-08-07 NOTE — Assessment & Plan Note (Signed)
Continue statin. Not on aspirin given need for anticoagulations. Patient's recent symptoms are very atypical. Nuclear study shows previous infarct but no ischemia. Continue medical therapy.

## 2014-08-07 NOTE — Progress Notes (Signed)
HPI: FU coronary artery disease and atrial fibrillation. Patient had stents placed at [facility] in 2000. Records are not available. Patient seen preoperatively at [facility] on September 01 2012 and electrocardiogram showed atrial fibrillation. Note patient had an MRI in July of 2013. There were scattered numerous but small foci throughout the cerebral white matter. There was evidence of tiny chronic lacunar infarcts in the inferior left cerebellum. Echocardiogram in October of 2013 showed normal LV function and mild aortic insufficiency. TSH in September of 2013 was 1.612. Patient has been maintained on anticoagulation. Seen recently for chest pain by Kerin Ransom. Nuclear study July 2015 showed an ejection fraction of 47%. There was a fixed inferior lateral defect suggestive of scar. No ischemia. Since last seen,    Current Outpatient Prescriptions  Medication Sig Dispense Refill  . acetaminophen (TYLENOL) 325 MG tablet Take 650 mg by mouth every 6 (six) hours as needed. For headache.      Marland Kitchen atorvastatin (LIPITOR) 10 MG tablet Take 1 tablet (10 mg total) by mouth daily.  30 tablet  5  . Cholecalciferol (VITAMIN D3) 5000 UNITS CAPS Take 5,000 mg by mouth every morning.      . hydrochlorothiazide (HYDRODIURIL) 25 MG tablet TAKE ONE TABLET BY MOUTH ONCE DAILY  30 tablet  0  . imatinib ([name]) 100 MG tablet Take 2 tablets (200 mg total) by mouth daily. Take with meals and large glass of water.Caution:Chemotherapy.  60 tablet  6  . lisinopril (PRINIVIL,ZESTRIL) 20 MG tablet TAKE ONE TABLET BY MOUTH TWICE DAILY  60 tablet  0  . metoprolol succinate (TOPROL XL) 25 MG 24 hr tablet Take 1 tablet (25 mg total) by mouth daily.  30 tablet  11  . Multiple Vitamins-Minerals (MULTIVITAMIN WITH MINERALS) tablet Take 1 tablet by mouth daily.        . nitroGLYCERIN (NITROSTAT) 0.4 MG SL tablet Place 1 tablet (0.4 mg total) under the tongue every 5 (five) minutes as needed for chest pain.  25  tablet  2  . omeprazole (PRILOSEC) 20 MG capsule Take 1 capsule (20 mg total) by mouth daily.  30 capsule  0  . ondansetron (ZOFRAN) 4 MG tablet Take 1 tablet (4 mg total) by mouth every 6 (six) hours.  12 tablet  0  . Probiotic Product (PROBIOTIC FORMULA PO) Take by mouth daily.      . prochlorperazine (COMPAZINE) 10 MG tablet Take 10 mg by mouth every 6 (six) hours as needed.      Marland Kitchen UNKNOWN TO PATIENT PT UNSURE ABOUT SOME MEDICATIONS GIVEN  LIST FOR HIM TO COMPARE WITH HOME MEDS AND LET us KNOW 07-26-14      . XARELTO 20 MG TABS tablet TAKE 1 TABLET (20 MG TOTAL) BY MOUTH DAILY.  30 tablet  3   No current facility-administered medications for this visit.     Past Medical History  Diagnosis Date  . CML (chronic myelocytic leukemia) 12/16/2011  . History of chicken pox   . Hypertension   . Hyperlipidemia   . History of kidney stones   . Coronary heart disease   . Atrial fibrillation   . IBS (irritable bowel syndrome) 09/20/2013  . Grief reaction 03/18/2014  . Medicare annual wellness visit, subsequent 03/23/2014    Past Surgical History  Procedure Laterality Date  . Cholecystectomy  2003  . Coronary angioplasty with stent placement  2000    History   Social History  . Marital Status:  Married    Spouse Name: N/A    Number of Children: 3  . Years of Education: N/A   Occupational History  .      Retired   Social History Main Topics  . Smoking status: Current Every Day Smoker -- 0.50 packs/day    Types: Cigarettes    Start date: 10/30/1967  . Smokeless tobacco: Never Used     Comment: 07-26-14 still smoking  . Alcohol Use: Yes     Comment: Rare  . Drug Use: No  . Sexual Activity: No     Comment: wife died in 27-Jan-2023 married 24 years    Other Topics Concern  . Not on file   Social History Narrative  . No narrative on file    ROS: no fevers or chills, productive cough, hemoptysis, dysphasia, odynophagia, melena, hematochezia, dysuria, hematuria, rash, seizure  activity, orthopnea, PND, pedal edema, claudication. Remaining systems are negative.  Physical Exam: Well-developed well-nourished in no acute distress.  Skin is warm and dry.  HEENT is normal.  Neck is supple.  Chest is clear to auscultation with normal expansion.  Cardiovascular exam is regular rate and rhythm.  Abdominal exam nontender or distended. No masses palpated. Extremities show no edema. neuro grossly intact  ECG     This encounter was created in error - please disregard.

## 2014-08-07 NOTE — Assessment & Plan Note (Signed)
Continue present blood pressure medications. 

## 2014-08-07 NOTE — Assessment & Plan Note (Signed)
Patient counseled on discontinuing. 

## 2014-08-07 NOTE — Progress Notes (Signed)
HPI: FU coronary artery disease and atrial fibrillation. Patient had stents placed at [facility] in 2000. Records are not available. Patient seen preoperatively at [facility] on September 01 2012 and electrocardiogram showed atrial fibrillation. Note patient had an MRI in July of 2013. There were scattered numerous but small foci throughout the cerebral white matter. There was evidence of tiny chronic lacunar infarcts in the inferior left cerebellum. Echocardiogram in October of 2013 showed normal LV function and mild aortic insufficiency. TSH in September of 2013 was 1.612. Patient has been maintained on anticoagulation. Seen recently for chest pain by Kerin Ransom. Nuclear study July 2015 showed an ejection fraction of 47%. There was a fixed inferior lateral defect suggestive of scar. No ischemia. Since last seen, He denies dyspnea or exertional chest pain. He has times where he feels nauseated with a full feeling in his abdomen which then radiates to his chest with a burning sensation. This is not exertional. He has not had syncope.   Current Outpatient Prescriptions  Medication Sig Dispense Refill  . acetaminophen (TYLENOL) 325 MG tablet Take 650 mg by mouth every 6 (six) hours as needed. For headache.      Marland Kitchen atorvastatin (LIPITOR) 10 MG tablet Take 1 tablet (10 mg total) by mouth daily.  30 tablet  5  . Cholecalciferol (VITAMIN D3) 5000 UNITS CAPS Take 5,000 mg by mouth every morning.      . hydrochlorothiazide (HYDRODIURIL) 25 MG tablet TAKE ONE TABLET BY MOUTH ONCE DAILY  30 tablet  0  . imatinib ([name]) 100 MG tablet Take 2 tablets (200 mg total) by mouth daily. Take with meals and large glass of water.Caution:Chemotherapy.  60 tablet  6  . lisinopril (PRINIVIL,ZESTRIL) 20 MG tablet TAKE ONE TABLET BY MOUTH TWICE DAILY  60 tablet  0  . metoprolol succinate (TOPROL XL) 25 MG 24 hr tablet Take 1 tablet (25 mg total) by mouth daily.  30 tablet  11  . Multiple  Vitamins-Minerals (MULTIVITAMIN WITH MINERALS) tablet Take 1 tablet by mouth daily.        . nitroGLYCERIN (NITROSTAT) 0.4 MG SL tablet Place 1 tablet (0.4 mg total) under the tongue every 5 (five) minutes as needed for chest pain.  25 tablet  2  . omeprazole (PRILOSEC) 20 MG capsule Take 1 capsule (20 mg total) by mouth daily.  30 capsule  0  . ondansetron (ZOFRAN) 4 MG tablet Take 1 tablet (4 mg total) by mouth every 6 (six) hours.  12 tablet  0  . Probiotic Product (PROBIOTIC FORMULA PO) Take by mouth daily.      . prochlorperazine (COMPAZINE) 10 MG tablet Take 10 mg by mouth every 6 (six) hours as needed.      Marland Kitchen UNKNOWN TO PATIENT PT UNSURE ABOUT SOME MEDICATIONS GIVEN  LIST FOR HIM TO COMPARE WITH HOME MEDS AND LET us KNOW 07-26-14      . XARELTO 20 MG TABS tablet TAKE 1 TABLET (20 MG TOTAL) BY MOUTH DAILY.  30 tablet  3   No current facility-administered medications for this visit.     Past Medical History  Diagnosis Date  . CML (chronic myelocytic leukemia) 12/16/2011  . History of chicken pox   . Hypertension   . Hyperlipidemia   . History of kidney stones   . Coronary heart disease   . Atrial fibrillation   . IBS (irritable bowel syndrome) 09/20/2013  . Grief reaction 03/18/2014  . Medicare annual wellness  visit, subsequent 03/23/2014    Past Surgical History  Procedure Laterality Date  . Cholecystectomy  2003  . Coronary angioplasty with stent placement  2000    History   Social History  . Marital Status: Married    Spouse Name: N/A    Number of Children: 3  . Years of Education: N/A   Occupational History  .      Retired   Social History Main Topics  . Smoking status: Current Every Day Smoker -- 0.50 packs/day    Types: Cigarettes    Start date: 10/30/1967  . Smokeless tobacco: Never Used     Comment: 07-26-14 still smoking  . Alcohol Use: Yes     Comment: Rare  . Drug Use: No  . Sexual Activity: No     Comment: wife died in January 15, 2023 married 24 years     Other Topics Concern  . Not on file   Social History Narrative  . No narrative on file    ROS: Headaches but no fevers or chills, productive cough, hemoptysis, dysphasia, odynophagia, melena, hematochezia, dysuria, hematuria, rash, seizure activity, orthopnea, PND, pedal edema, claudication. Remaining systems are negative.  Physical Exam: Well-developed well-nourished in no acute distress.  Skin is warm and dry.  HEENT is normal.  Neck is supple.  Chest is clear to auscultation with normal expansion.  Cardiovascular exam is regular rate and rhythm.  Abdominal exam nontender or distended. No masses palpated. Bruit Extremities show no edema. neuro grossly intact

## 2014-08-07 NOTE — Assessment & Plan Note (Signed)
Continue statin. 

## 2014-08-07 NOTE — Assessment & Plan Note (Signed)
Schedule abdominal ultrasound.

## 2014-08-07 NOTE — Patient Instructions (Signed)
Your physician wants you to follow-up in: Kelly will receive a reminder letter in the mail two months in advance. If you don't receive a letter, please call our office to schedule the follow-up appointment.   Your physician has requested that you have an abdominal aorta duplex. During this test, an ultrasound is used to evaluate the aorta. Allow 30 minutes for this exam. Do not eat after midnight the day before and avoid carbonated beverages AT [facility]

## 2014-08-12 ENCOUNTER — Ambulatory Visit (HOSPITAL_BASED_OUTPATIENT_CLINIC_OR_DEPARTMENT_OTHER)
Admission: RE | Admit: 2014-08-12 | Discharge: 2014-08-12 | Disposition: A | Discharge status: Home / Self Care | Payer: Medicare Other | Source: Ambulatory Visit | Attending: Cardiology | Admitting: Cardiology

## 2014-08-12 DIAGNOSIS — R0989 Other specified symptoms and signs involving the circulatory and respiratory systems: Secondary | ICD-10-CM

## 2014-08-12 DIAGNOSIS — I714 Abdominal aortic aneurysm, without rupture, unspecified: Secondary | ICD-10-CM | POA: Insufficient documentation

## 2014-08-14 ENCOUNTER — Ambulatory Visit: Payer: Medicare Other | Admitting: Cardiology

## 2014-08-25 ENCOUNTER — Other Ambulatory Visit: Payer: Self-pay | Admitting: Family Medicine

## 2014-08-28 ENCOUNTER — Other Ambulatory Visit: Payer: Self-pay

## 2014-08-28 MED ORDER — HYDROCHLOROTHIAZIDE 25 MG PO TABS: 25.0000 mg | ORAL_TABLET | Freq: Every day | ORAL | Status: DC

## 2014-08-28 MED ORDER — LISINOPRIL 20 MG PO TABS: 20.0000 mg | ORAL_TABLET | Freq: Two times a day (BID) | ORAL | Status: DC

## 2014-09-28 ENCOUNTER — Other Ambulatory Visit: Payer: Self-pay | Admitting: Family Medicine

## 2014-09-30 NOTE — Telephone Encounter (Signed)
Rx request to pharmacy/SLS Requested drug refills are authorized, however, the patient needs further evaluation and/or laboratory testing before further refills are given. Ask him to make an appointment for this.  

## 2014-10-08 ENCOUNTER — Telehealth: Payer: Self-pay | Admitting: Cardiology

## 2014-10-08 NOTE — Telephone Encounter (Signed)
Spoke with pt, he is concerned that his aortic aneurysm maybe causing nausea and dizziness. reassurance given to patient that the symptoms are not related. He is c/o dizziness all the time. It can get worse with lying down and when moving quickly. He reports being off balance. His bp has been good by his report. Patient was encouraged to call his PCP for evaluation of his inner ear. Pt has also not been taking his omeprazole,he was encouraged to restart. He will f/u with his PCP regarding his current symptoms.

## 2014-10-08 NOTE — Telephone Encounter (Signed)
Pt said he found out he has a small aneurysm in his stomach. Please call ,says he have some questions.

## 2014-10-09 ENCOUNTER — Ambulatory Visit (INDEPENDENT_AMBULATORY_CARE_PROVIDER_SITE_OTHER): Payer: Medicare Other | Admitting: Physician Assistant

## 2014-10-09 ENCOUNTER — Encounter: Payer: Self-pay | Admitting: Physician Assistant

## 2014-10-09 VITALS — BP 128/84 | HR 73 | Temp 99.1°F | Wt 192.0 lb

## 2014-10-09 DIAGNOSIS — I2510 Atherosclerotic heart disease of native coronary artery without angina pectoris: Secondary | ICD-10-CM

## 2014-10-09 DIAGNOSIS — K219 Gastro-esophageal reflux disease without esophagitis: Secondary | ICD-10-CM

## 2014-10-09 DIAGNOSIS — R42 Dizziness and giddiness: Secondary | ICD-10-CM

## 2014-10-09 MED ORDER — ONDANSETRON HCL 4 MG PO TABS: 4.0000 mg | ORAL_TABLET | Freq: Three times a day (TID) | ORAL | Status: DC | PRN

## 2014-10-09 MED ORDER — FLUTICASONE PROPIONATE 50 MCG/ACT NA SUSP: 2.0000 | Freq: Every day | NASAL | Status: DC

## 2014-10-09 MED ORDER — OMEPRAZOLE 20 MG PO CPDR: 20.0000 mg | DELAYED_RELEASE_CAPSULE | Freq: Every day | ORAL | Status: DC

## 2014-10-09 MED ORDER — MECLIZINE HCL 25 MG PO TABS: 25.0000 mg | ORAL_TABLET | Freq: Three times a day (TID) | ORAL | Status: DC | PRN

## 2014-10-09 NOTE — Progress Notes (Signed)
Pre visit review using our clinic review tool, if applicable. No additional management support is needed unless otherwise documented below in the visit note. 

## 2014-10-09 NOTE — Progress Notes (Signed)
Patient presents to clinic today c/o nausea and dizziness with movement of his head x 2 weeks. Endorses dizziness but denies lightheadedness.  Denies recent change to medication.  Denies URI symptoms.  Denies chest pain, palpitations, headache or vision changes.  States nausea is improved now that Omeprazole has been restarted per Cardiology recommendations.  Past Medical History  Diagnosis Date  . CML (chronic myelocytic leukemia) 12/16/2011  . History of chicken pox   . Hypertension   . Hyperlipidemia   . History of kidney stones   . Coronary heart disease   . Atrial fibrillation   . IBS (irritable bowel syndrome) 09/20/2013  . Grief reaction 03/18/2014  . Medicare annual wellness visit, subsequent 03/23/2014    Current Outpatient Prescriptions on File Prior to Visit  Medication Sig Dispense Refill  . acetaminophen (TYLENOL) 325 MG tablet Take 650 mg by mouth every 6 (six) hours as needed. For headache.      Marland Kitchen atorvastatin (LIPITOR) 10 MG tablet Take 1 tablet (10 mg total) by mouth daily.  30 tablet  5  . Cholecalciferol (VITAMIN D3) 5000 UNITS CAPS Take 5,000 mg by mouth every morning.      . hydrochlorothiazide (HYDRODIURIL) 25 MG tablet Take 1 tablet (25 mg total) by mouth daily.  30 tablet  2  . imatinib ([name]) 100 MG tablet Take 2 tablets (200 mg total) by mouth daily. Take with meals and large glass of water.Caution:Chemotherapy.  60 tablet  6  . lisinopril (PRINIVIL,ZESTRIL) 20 MG tablet Take 1 tablet (20 mg total) by mouth 2 (two) times daily.  60 tablet  2  . metoprolol succinate (TOPROL-XL) 25 MG 24 hr tablet TAKE ONE TABLET BY MOUTH ONCE DAILY  30 tablet  0  . Multiple Vitamins-Minerals (MULTIVITAMIN WITH MINERALS) tablet Take 1 tablet by mouth daily.        . nitroGLYCERIN (NITROSTAT) 0.4 MG SL tablet Place 1 tablet (0.4 mg total) under the tongue every 5 (five) minutes as needed for chest pain.  25 tablet  2  . ondansetron (ZOFRAN) 4 MG tablet Take 1 tablet (4 mg  total) by mouth every 6 (six) hours.  12 tablet  0  . Probiotic Product (PROBIOTIC FORMULA PO) Take by mouth daily.      . rivaroxaban (XARELTO) 20 MG TABS tablet Take 1 tablet (20 mg total) by mouth daily with supper.  30 tablet  6  . UNKNOWN TO PATIENT PT UNSURE ABOUT SOME MEDICATIONS GIVEN  LIST FOR HIM TO COMPARE WITH HOME MEDS AND LET us KNOW 07-26-14       No current facility-administered medications on file prior to visit.    No Known Allergies  Family History  Problem Relation Age of Onset  . Arthritis Mother   . Hypertension Mother   . Heart disease Mother     Rheumatic fever  . Rheumatic fever Mother   . Other Mother     brain tumor  . Cancer Mother     leukemia  . Hypertension Father   . Alcohol abuse Father   . Colon cancer Neg Hx   . Breast cancer Neg Hx   . Diabetes Neg Hx   . Prostate cancer Neg Hx     History   Social History  . Marital Status: Widowed    Spouse Name: N/A    Number of Children: 3  . Years of Education: N/A   Occupational History  .      Retired  Social History Main Topics  . Smoking status: Current Every Day Smoker -- 0.50 packs/day    Types: Cigarettes    Start date: 10/30/1967  . Smokeless tobacco: Never Used     Comment: 07-26-14 still smoking  . Alcohol Use: Yes     Comment: Rare  . Drug Use: No  . Sexual Activity: No     Comment: wife died in 02-04-2023 married 24 years    Other Topics Concern  . None   Social History Narrative  . None   Review of Systems - See HPI.  All other ROS are negative.  BP 128/84  Pulse 73  Temp(Src) 99.1 F (37.3 C)  Wt 192 lb (87.091 kg)  SpO2 96%  Physical Exam  Vitals reviewed. Constitutional: He is oriented to person, place, and time and well-developed, well-nourished, and in no distress.  HENT:  Head: Normocephalic and atraumatic.  Right Ear: External ear and ear canal normal. A middle ear effusion is present.  Left Ear: External ear and ear canal normal. A middle ear effusion is  present.  Nose: Nose normal.  Dix Hallpike-negative  Eyes: Conjunctivae are normal.  Neck: Neck supple.  Cardiovascular: Normal rate, regular rhythm, normal heart sounds and intact distal pulses.   Pulmonary/Chest: Effort normal and breath sounds normal. No respiratory distress. He has no wheezes. He has no rales. He exhibits no tenderness.  Abdominal: Soft. Bowel sounds are normal. He exhibits no distension and no mass. There is no tenderness. There is no rebound and no guarding.  Neurological: He is alert and oriented to person, place, and time.  Skin: Skin is warm and dry. No rash noted.  Psychiatric: Affect normal.   Recent Results (from the past 2160 hour(s))  CBC WITH DIFFERENTIAL ([facility])     Status: None   Collection Time    07/26/14  9:48 AM      Result Value Ref Range   WBC 9.1  4.0 - 10.0 10e3/uL   RBC 4.45  4.20 - 5.70 10e6/uL   HGB 13.8  13.0 - 17.1 g/dL   HCT 41.2  38.7 - 49.9 %   MCV 93  82 - 98 fL   MCH 31.0  28.0 - 33.4 pg   MCHC 33.5  32.0 - 35.9 g/dL   RDW 14.4  11.1 - 15.7 %   Platelets 222  145 - 400 10e3/uL   NEUT# 5.4  1.5 - 6.5 10e3/uL   LYMPH# 2.4  0.9 - 3.3 10e3/uL   MONO# 0.7  0.1 - 0.9 10e3/uL   Eosinophils Absolute 0.5  0.0 - 0.5 10e3/uL   BASO# 0.1  0.0 - 0.2 10e3/uL   NEUT% 59.5  40.0 - 80.0 %   LYMPH% 26.2  14.0 - 48.0 %   MONO% 8.0  0.0 - 13.0 %   EOS% 5.3  0.0 - 7.0 %   BASO% 1.0  0.0 - 2.0 %  COMPREHENSIVE METABOLIC PANEL     Status: None   Collection Time    07/26/14  9:48 AM      Result Value Ref Range   Sodium 139  135 - 145 mEq/L   Potassium 4.4  3.5 - 5.3 mEq/L   Chloride 105  96 - 112 mEq/L   CO2 26  19 - 32 mEq/L   Glucose, Bld 88  70 - 99 mg/dL   BUN 12  6 - 23 mg/dL   Creatinine, Ser 1.33  0.50 - 1.35 mg/dL   Total  Bilirubin 0.4  0.2 - 1.2 mg/dL   Alkaline Phosphatase 78  39 - 117 U/L   AST 16  0 - 37 U/L   ALT 18  0 - 53 U/L   Total Protein 7.0  6.0 - 8.3 g/dL   Albumin 4.4  3.5 - 5.2 g/dL   Calcium 9.3  8.4 -  10.5 mg/dL  LACTATE DEHYDROGENASE     Status: None   Collection Time    07/26/14  9:48 AM      Result Value Ref Range   LDH 128  94 - 250 U/L   Assessment/Plan: Dizziness Dix Hallpike negative.  No carotid bruit auscultated on examination.  Exam negative for murmur.  OVS negative.  Middle ear effusions noted bilaterally. Likely cause of symptoms. Rx Flonase.  Daily zyrtec.  Limit salt intake.  Rx zofran for nausea. Rx meclizine for vertigo.  Follow-up 1 week.  Alarm signs/symptoms discussed with patient.  Esophageal reflux PPI restarted per Cardiology recommendations. I agree with this measure.  Avoid late-night eating and spicy foods.  Elevate HOB.

## 2014-10-09 NOTE — Patient Instructions (Signed)
Continue medications as directed.  Limit salt intake.  Use Flonase daily as directed.  Use Zofran for nausea and Meclizine for dizziness.  Follow-up in 1 week.  If symptoms acutely worsen please proceed to the ER.

## 2014-10-13 DIAGNOSIS — K219 Gastro-esophageal reflux disease without esophagitis: Secondary | ICD-10-CM | POA: Insufficient documentation

## 2014-10-13 DIAGNOSIS — R42 Dizziness and giddiness: Secondary | ICD-10-CM | POA: Insufficient documentation

## 2014-10-13 NOTE — Assessment & Plan Note (Signed)
Dix Hallpike negative.  No carotid bruit auscultated on examination.  Exam negative for murmur.  OVS negative.  Middle ear effusions noted bilaterally. Likely cause of symptoms. Rx Flonase.  Daily zyrtec.  Limit salt intake.  Rx zofran for nausea. Rx meclizine for vertigo.  Follow-up 1 week.  Alarm signs/symptoms discussed with patient.

## 2014-10-13 NOTE — Assessment & Plan Note (Signed)
PPI restarted per Cardiology recommendations. I agree with this measure.  Avoid late-night eating and spicy foods.  Elevate HOB.

## 2014-10-16 ENCOUNTER — Encounter: Payer: Self-pay | Admitting: Physician Assistant

## 2014-10-16 ENCOUNTER — Ambulatory Visit (INDEPENDENT_AMBULATORY_CARE_PROVIDER_SITE_OTHER): Payer: Medicare Other | Admitting: Physician Assistant

## 2014-10-16 VITALS — BP 115/62 | HR 59 | Temp 98.6°F | Resp 16 | Ht 68.0 in | Wt 192.5 lb

## 2014-10-16 DIAGNOSIS — R42 Dizziness and giddiness: Secondary | ICD-10-CM | POA: Diagnosis not present

## 2014-10-16 DIAGNOSIS — I2510 Atherosclerotic heart disease of native coronary artery without angina pectoris: Secondary | ICD-10-CM

## 2014-10-16 NOTE — Progress Notes (Signed)
Pre visit review using our clinic review tool, if applicable. No additional management support is needed unless otherwise documented below in the visit note/SLS  

## 2014-10-16 NOTE — Assessment & Plan Note (Signed)
Symptoms have resolved.  Continue current regimens.

## 2014-10-16 NOTE — Progress Notes (Signed)
Patient presents to clinic today for follow-up of dizziness stemming from fluid behind the ear drum.  Patient endorses taking medications as directed.  Notes resolution in his symptoms.  Denies dizziness, lightheadedness.  Denies new concern at today's visit.  Past Medical History  Diagnosis Date  . CML (chronic myelocytic leukemia) 12/16/2011  . History of chicken pox   . Hypertension   . Hyperlipidemia   . History of kidney stones   . Coronary heart disease   . Atrial fibrillation   . IBS (irritable bowel syndrome) 09/20/2013  . Grief reaction 03/18/2014  . Medicare annual wellness visit, subsequent 03/23/2014    Current Outpatient Prescriptions on File Prior to Visit  Medication Sig Dispense Refill  . acetaminophen (TYLENOL) 325 MG tablet Take 650 mg by mouth every 6 (six) hours as needed. For headache.      Marland Kitchen atorvastatin (LIPITOR) 10 MG tablet Take 1 tablet (10 mg total) by mouth daily.  30 tablet  5  . Cholecalciferol (VITAMIN D3) 5000 UNITS CAPS Take 5,000 mg by mouth every morning.      . fluticasone (FLONASE) 50 MCG/ACT nasal spray Place 2 sprays into both nostrils daily.  16 g  6  . hydrochlorothiazide (HYDRODIURIL) 25 MG tablet Take 1 tablet (25 mg total) by mouth daily.  30 tablet  2  . imatinib ([name]) 100 MG tablet Take 2 tablets (200 mg total) by mouth daily. Take with meals and large glass of water.Caution:Chemotherapy.  60 tablet  6  . lisinopril (PRINIVIL,ZESTRIL) 20 MG tablet Take 1 tablet (20 mg total) by mouth 2 (two) times daily.  60 tablet  2  . meclizine (ANTIVERT) 25 MG tablet Take 1 tablet (25 mg total) by mouth 3 (three) times daily as needed for dizziness.  30 tablet  0  . metoprolol succinate (TOPROL-XL) 25 MG 24 hr tablet TAKE ONE TABLET BY MOUTH ONCE DAILY  30 tablet  0  . Multiple Vitamins-Minerals (MULTIVITAMIN WITH MINERALS) tablet Take 1 tablet by mouth daily.        . nitroGLYCERIN (NITROSTAT) 0.4 MG SL tablet Place 1 tablet (0.4 mg total) under the  tongue every 5 (five) minutes as needed for chest pain.  25 tablet  2  . omeprazole (PRILOSEC) 20 MG capsule Take 1 capsule (20 mg total) by mouth daily.  30 capsule  0  . ondansetron (ZOFRAN) 4 MG tablet Take 1 tablet (4 mg total) by mouth every 8 (eight) hours as needed for nausea or vomiting.  20 tablet  0  . Probiotic Product (PROBIOTIC FORMULA PO) Take by mouth daily.      . rivaroxaban (XARELTO) 20 MG TABS tablet Take 1 tablet (20 mg total) by mouth daily with supper.  30 tablet  6  . UNKNOWN TO PATIENT PT UNSURE ABOUT SOME MEDICATIONS GIVEN  LIST FOR HIM TO COMPARE WITH HOME MEDS AND LET us KNOW 07-26-14       No current facility-administered medications on file prior to visit.    No Known Allergies  Family History  Problem Relation Age of Onset  . Arthritis Mother   . Hypertension Mother   . Heart disease Mother     Rheumatic fever  . Rheumatic fever Mother   . Other Mother     brain tumor  . Cancer Mother     leukemia  . Hypertension Father   . Alcohol abuse Father   . Colon cancer Neg Hx   . Breast cancer Neg Hx   .  Diabetes Neg Hx   . Prostate cancer Neg Hx     History   Social History  . Marital Status: Widowed    Spouse Name: N/A    Number of Children: 3  . Years of Education: N/A   Occupational History  .      Retired   Social History Main Topics  . Smoking status: Current Every Day Smoker -- 0.50 packs/day    Types: Cigarettes    Start date: 10/30/1967  . Smokeless tobacco: Never Used     Comment: 07-26-14 still smoking  . Alcohol Use: Yes     Comment: Rare  . Drug Use: No  . Sexual Activity: No     Comment: wife died in 2023-01-15 married 24 years    Other Topics Concern  . None   Social History Narrative  . None    Review of Systems - See HPI.  All other ROS are negative.  BP 115/62  Pulse 59  Temp(Src) 98.6 F (37 C) (Oral)  Resp 16  Ht 5\' 8"  (1.727 m)  Wt 192 lb 8 oz (87.317 kg)  BMI 29.28 kg/m2  SpO2 100%  Physical Exam  Vitals  reviewed. Constitutional: He is oriented to person, place, and time and well-developed, well-nourished, and in no distress.  HENT:  Head: Normocephalic and atraumatic.  Right Ear: External ear normal.  Left Ear: External ear normal.  Nose: Nose normal.  Mouth/Throat: Oropharynx is clear and moist. No oropharyngeal exudate.  TM within normal limits bilaterally.  Eyes: Conjunctivae are normal.  Neck: Neck supple.  Cardiovascular: Normal rate, regular rhythm, normal heart sounds and intact distal pulses.   Pulmonary/Chest: Effort normal and breath sounds normal. No respiratory distress. He has no wheezes. He has no rales. He exhibits no tenderness.  Neurological: He is alert and oriented to person, place, and time.  Skin: Skin is warm and dry. No rash noted.  Psychiatric: Affect normal.    Recent Results (from the past 2160 hour(s))  CBC WITH DIFFERENTIAL ([facility])     Status: None   Collection Time    07/26/14  9:48 AM      Result Value Ref Range   WBC 9.1  4.0 - 10.0 10e3/uL   RBC 4.45  4.20 - 5.70 10e6/uL   HGB 13.8  13.0 - 17.1 g/dL   HCT 41.2  38.7 - 49.9 %   MCV 93  82 - 98 fL   MCH 31.0  28.0 - 33.4 pg   MCHC 33.5  32.0 - 35.9 g/dL   RDW 14.4  11.1 - 15.7 %   Platelets 222  145 - 400 10e3/uL   NEUT# 5.4  1.5 - 6.5 10e3/uL   LYMPH# 2.4  0.9 - 3.3 10e3/uL   MONO# 0.7  0.1 - 0.9 10e3/uL   Eosinophils Absolute 0.5  0.0 - 0.5 10e3/uL   BASO# 0.1  0.0 - 0.2 10e3/uL   NEUT% 59.5  40.0 - 80.0 %   LYMPH% 26.2  14.0 - 48.0 %   MONO% 8.0  0.0 - 13.0 %   EOS% 5.3  0.0 - 7.0 %   BASO% 1.0  0.0 - 2.0 %  COMPREHENSIVE METABOLIC PANEL     Status: None   Collection Time    07/26/14  9:48 AM      Result Value Ref Range   Sodium 139  135 - 145 mEq/L   Potassium 4.4  3.5 - 5.3 mEq/L   Chloride 105  96 - 112 mEq/L   CO2 26  19 - 32 mEq/L   Glucose, Bld 88  70 - 99 mg/dL   BUN 12  6 - 23 mg/dL   Creatinine, Ser 1.33  0.50 - 1.35 mg/dL   Total Bilirubin 0.4  0.2 - 1.2 mg/dL     Alkaline Phosphatase 78  39 - 117 U/L   AST 16  0 - 37 U/L   ALT 18  0 - 53 U/L   Total Protein 7.0  6.0 - 8.3 g/dL   Albumin 4.4  3.5 - 5.2 g/dL   Calcium 9.3  8.4 - 10.5 mg/dL  LACTATE DEHYDROGENASE     Status: None   Collection Time    07/26/14  9:48 AM      Result Value Ref Range   LDH 128  94 - 250 U/L   Assessment/Plan: Dizziness Symptoms have resolved.  Continue current regimens.

## 2014-10-16 NOTE — Patient Instructions (Signed)
I am glad you are feeling much better.  Please continue Flonase daily to prevent recurrence of symptoms.  Follow-up with Dr. Charlett Blake as scheduled.

## 2014-10-17 ENCOUNTER — Telehealth: Payer: Self-pay | Admitting: Family Medicine

## 2014-10-17 NOTE — Telephone Encounter (Signed)
emmi emailed °

## 2014-10-24 ENCOUNTER — Encounter: Payer: Self-pay | Admitting: Nurse Practitioner

## 2014-10-24 DIAGNOSIS — C9210 Chronic myeloid leukemia, BCR/ABL-positive, not having achieved remission: Secondary | ICD-10-CM

## 2014-10-24 MED ORDER — IMATINIB MESYLATE 100 MG PO TABS: 200.0000 mg | ORAL_TABLET | Freq: Every day | ORAL | Status: DC

## 2014-10-24 NOTE — Progress Notes (Signed)
Received a fax from [facility] requesting new script and letter of medical necessity. This information was completed and sent to [place] [phone]). Confirmation was received.

## 2014-11-04 ENCOUNTER — Other Ambulatory Visit: Payer: Self-pay | Admitting: Physician Assistant

## 2014-11-11 ENCOUNTER — Encounter: Payer: Self-pay | Admitting: *Deleted

## 2014-11-11 NOTE — Progress Notes (Signed)
Received fax from [employer] stating that the pt was approved for [place] until 11/20/15.

## 2014-11-18 ENCOUNTER — Other Ambulatory Visit: Payer: Self-pay | Admitting: Physician Assistant

## 2014-11-18 ENCOUNTER — Other Ambulatory Visit: Payer: Self-pay | Admitting: Family Medicine

## 2014-11-18 NOTE — Telephone Encounter (Signed)
Rx request to pharmacy/SLS  

## 2014-11-29 ENCOUNTER — Other Ambulatory Visit (HOSPITAL_BASED_OUTPATIENT_CLINIC_OR_DEPARTMENT_OTHER): Payer: Medicare Other | Admitting: Lab

## 2014-11-29 ENCOUNTER — Ambulatory Visit (HOSPITAL_BASED_OUTPATIENT_CLINIC_OR_DEPARTMENT_OTHER): Payer: Medicare Other | Admitting: Hematology & Oncology

## 2014-11-29 ENCOUNTER — Encounter: Payer: Self-pay | Admitting: Hematology & Oncology

## 2014-11-29 VITALS — BP 114/78 | HR 103 | Temp 98.2°F | Resp 18 | Ht 68.0 in | Wt 190.0 lb

## 2014-11-29 DIAGNOSIS — C9210 Chronic myeloid leukemia, BCR/ABL-positive, not having achieved remission: Secondary | ICD-10-CM | POA: Diagnosis not present

## 2014-11-29 DIAGNOSIS — C9211 Chronic myeloid leukemia, BCR/ABL-positive, in remission: Secondary | ICD-10-CM

## 2014-11-29 DIAGNOSIS — Z720 Tobacco use: Secondary | ICD-10-CM | POA: Diagnosis not present

## 2014-11-29 LAB — COMPREHENSIVE METABOLIC PANEL
ALBUMIN: 4.3 g/dL (ref 3.5–5.2)
ALT: 11 U/L (ref 0–53)
AST: 13 U/L (ref 0–37)
Alkaline Phosphatase: 87 U/L (ref 39–117)
BUN: 14 mg/dL (ref 6–23)
CALCIUM: 9.6 mg/dL (ref 8.4–10.5)
CHLORIDE: 105 meq/L (ref 96–112)
CO2: 25 mEq/L (ref 19–32)
Creatinine, Ser: 1.3 mg/dL (ref 0.50–1.35)
Glucose, Bld: 96 mg/dL (ref 70–99)
POTASSIUM: 3.6 meq/L (ref 3.5–5.3)
Sodium: 140 mEq/L (ref 135–145)
Total Bilirubin: 0.4 mg/dL (ref 0.2–1.2)
Total Protein: 7.3 g/dL (ref 6.0–8.3)

## 2014-11-29 LAB — CBC WITH DIFFERENTIAL (CANCER CENTER ONLY)
BASO#: 0.1 10*3/uL (ref 0.0–0.2)
BASO%: 0.8 % (ref 0.0–2.0)
EOS%: 4.4 % (ref 0.0–7.0)
Eosinophils Absolute: 0.4 10*3/uL (ref 0.0–0.5)
HCT: 43.8 % (ref 38.7–49.9)
HGB: 14.7 g/dL (ref 13.0–17.1)
LYMPH#: 2 10*3/uL (ref 0.9–3.3)
LYMPH%: 23.4 % (ref 14.0–48.0)
MCH: 31.3 pg (ref 28.0–33.4)
MCHC: 33.6 g/dL (ref 32.0–35.9)
MCV: 93 fL (ref 82–98)
MONO#: 0.8 10*3/uL (ref 0.1–0.9)
MONO%: 9.1 % (ref 0.0–13.0)
NEUT#: 5.4 10*3/uL (ref 1.5–6.5)
NEUT%: 62.3 % (ref 40.0–80.0)
PLATELETS: 244 10*3/uL (ref 145–400)
RBC: 4.69 10*6/uL (ref 4.20–5.70)
RDW: 13.1 % (ref 11.1–15.7)
WBC: 8.7 10*3/uL (ref 4.0–10.0)

## 2014-11-29 LAB — LACTATE DEHYDROGENASE: LDH: 137 U/L (ref 94–250)

## 2014-11-29 NOTE — Progress Notes (Signed)
Hematology and Oncology Follow Up Visit  Jason [name] [id number] 1946/08/13 68 y.o. 11/29/2014   Principle Diagnosis:   Chronic phase CML  Paroxysmal atrial fibrillation  Current Therapy:    [name] 200 mg by mouth daily  Xarelto 20 mg by mouth daily     Interim History:  [name] is doing pretty well. He's had no problems with Jason heart. He had a good Thanksgiving. He ate quite a bit of food. He watched football.  He has had some issues with syncope or near syncope. He's had some dizziness. He says this seems to happen when he is constipated. I think he may have some vasovagal reaction that could be causing this.  He's had no issues with the [place]. Jason last BCR/ABL analysis was 0.04%.   He's had no swelling in the abdomen. He's had no leg swelling. He's had no rashes.  He is still smoking. He's trying to cut back on smoking.  He's had no bleeding. There's been no change in bowel or bladder habits.  Overall, Jason performance status is ECOG 1.  Has been 11 months since Jason [name] passed away. He is managing. He still has some tough days.  Medications: Current outpatient prescriptions: acetaminophen (TYLENOL) 325 MG tablet, Take 650 mg by mouth every 6 (six) hours as needed. For headache., Disp: , Rfl: ;  atorvastatin (LIPITOR) 10 MG tablet, Take 1 tablet (10 mg total) by mouth daily., Disp: 30 tablet, Rfl: 5;  Cholecalciferol (VITAMIN D3) 5000 UNITS CAPS, Take 5,000 mg by mouth every morning., Disp: , Rfl:  fluticasone (FLONASE) 50 MCG/ACT nasal spray, Place 2 sprays into both nostrils daily., Disp: 16 g, Rfl: 6;  hydrochlorothiazide (HYDRODIURIL) 25 MG tablet, Take 1 tablet (25 mg total) by mouth daily., Disp: 30 tablet, Rfl: 2;  imatinib ([name]) 100 MG tablet, Take 2 tablets (200 mg total) by mouth daily. Take with meals and large glass of water.Caution:Chemotherapy., Disp: 60 tablet, Rfl: 6 lisinopril (PRINIVIL,ZESTRIL) 20 MG tablet, Take 1 tablet (20 mg total) by mouth 2  (two) times daily., Disp: 60 tablet, Rfl: 2;  meclizine (ANTIVERT) 25 MG tablet, TAKE ONE TABLET BY MOUTH THREE TIMES DAILY AS NEEDED FOR DIZZINESS, Disp: 30 tablet, Rfl: 0 metoprolol succinate (TOPROL-XL) 25 MG 24 hr tablet, TAKE ONE TABLET BY MOUTH ONCE DAILY. MUST MAKE AN APPOINTMENT FOR EVALUATION AND LABORATORY TESTING BEFORE FURTHER REFILLS. (Patient taking differently: No sig reported), Disp: 30 tablet, Rfl: 0;  Multiple Vitamins-Minerals (MULTIVITAMIN WITH MINERALS) tablet, Take 1 tablet by mouth daily.  , Disp: , Rfl:  nitroGLYCERIN (NITROSTAT) 0.4 MG SL tablet, Place 1 tablet (0.4 mg total) under the tongue every 5 (five) minutes as needed for chest pain., Disp: 25 tablet, Rfl: 2;  omeprazole (PRILOSEC) 20 MG capsule, Take 1 capsule (20 mg total) by mouth daily., Disp: 30 capsule, Rfl: 0;  ondansetron (ZOFRAN) 4 MG tablet, TAKE ONE TABLET BY MOUTH EVERY 8 HOURS AS NEEDED FOR NAUSEA AND VOMITING, Disp: 20 tablet, Rfl: 0 Probiotic Product (PROBIOTIC FORMULA PO), Take by mouth daily., Disp: , Rfl: ;  rivaroxaban (XARELTO) 20 MG TABS tablet, Take 1 tablet (20 mg total) by mouth daily with supper., Disp: 30 tablet, Rfl: 6  Allergies: No Known Allergies  Past Medical History, Surgical history, Social history, and Family History were reviewed and updated.  Review of Systems: As above  Physical Exam:  height is 5\' 8"  (1.727 m) and weight is 190 lb (86.183 kg). Jason oral temperature is 98.2 F (36.8  C). Jason blood pressure is 114/78 and Jason pulse is 103. Jason respiration is 18.   Well-developed and well-nourished white woman. Head and neck exam does not her or oral lesions. He has no palpable cervical or supraclavicular lymph nodes. Lungs are clear bilaterally. Cardiac exam regular rate and rhythm with a normal S1 and S2. There are no murmurs rubs or bruits. Abdomen is soft. He has good bowel sounds. There is no palpable abdominal mass. There is no palpable liver or spleen tip. Back exam shows no  tenderness over the spine ribs or hips. Extremities shows no clubbing cyanosis or edema. Skin exam no rashes, ecchymosis or petechia. Neurological exam is nonfocal.  Lab Results  Component Value Date   WBC 9.1 07/26/2014   HGB 13.8 07/26/2014   HCT 41.2 07/26/2014   MCV 93 07/26/2014   PLT 222 07/26/2014     Chemistry      Component Value Date/Time   NA 139 07/26/2014 0948   NA 143 11/01/2012 1146   K 4.4 07/26/2014 0948   K 3.0* 11/01/2012 1146   CL 105 07/26/2014 0948   CL 104 11/01/2012 1146   CO2 26 07/26/2014 0948   CO2 29 11/01/2012 1146   BUN 12 07/26/2014 0948   BUN 12 11/01/2012 1146   CREATININE 1.33 07/26/2014 0948   CREATININE 1.28 03/12/2014 0849      Component Value Date/Time   CALCIUM 9.3 07/26/2014 0948   CALCIUM 9.3 11/01/2012 1146   ALKPHOS 78 07/26/2014 0948   ALKPHOS 64 11/01/2012 1146   AST 16 07/26/2014 0948   AST 20 11/01/2012 1146   ALT 18 07/26/2014 0948   ALT 8* 11/01/2012 1146   BILITOT 0.4 07/26/2014 0948   BILITOT 0.40 11/01/2012 1146      I looked at Jason blood smear. Jason white cells appeared mature. There were no blasts. There were no nucleated red cells. Platelets looked normal.   Impression and Plan: Jason [name] is a 68 year old gentleman with chronic phase CML. He is in remission by Jason last analysis. He is only on 200 mg a day.  We will continue him on the [place].  I will plan to get him back in 4 or 5 months now. I really believe that he should be stable.   Volanda Napoleon, MD 12/4/201510:43 AM

## 2014-11-29 NOTE — Patient Instructions (Signed)
Smoking Cessation Quitting smoking is important to your health and has many advantages. However, it is not always easy to quit since nicotine is a very addictive drug. Oftentimes, people try 3 times or more before being able to quit. This document explains the best ways for you to prepare to quit smoking. Quitting takes hard work and a lot of effort, but you can do it. ADVANTAGES OF QUITTING SMOKING  You will live longer, feel better, and live better.  Your body will feel the impact of quitting smoking almost immediately.  Within 20 minutes, blood pressure decreases. Your pulse returns to its normal level.  After 8 hours, carbon monoxide levels in the blood return to normal. Your oxygen level increases.  After 24 hours, the chance of having a heart attack starts to decrease. Your breath, hair, and body stop smelling like smoke.  After 48 hours, damaged nerve endings begin to recover. Your sense of taste and smell improve.  After 72 hours, the body is virtually free of nicotine. Your bronchial tubes relax and breathing becomes easier.  After 2 to 12 weeks, lungs can hold more air. Exercise becomes easier and circulation improves.  The risk of having a heart attack, stroke, cancer, or lung disease is greatly reduced.  After 1 year, the risk of coronary heart disease is cut in half.  After 5 years, the risk of stroke falls to the same as a nonsmoker.  After 10 years, the risk of lung cancer is cut in half and the risk of other cancers decreases significantly.  After 15 years, the risk of coronary heart disease drops, usually to the level of a nonsmoker.  If you are pregnant, quitting smoking will improve your chances of having a healthy baby.  The people you live with, especially any children, will be healthier.  You will have extra money to spend on things other than cigarettes. QUESTIONS TO THINK ABOUT BEFORE ATTEMPTING TO QUIT You may want to talk about your answers with your  health care provider.  Why do you want to quit?  If you tried to quit in the past, what helped and what did not?  What will be the most difficult situations for you after you quit? How will you plan to handle them?  Who can help you through the tough times? Your family? Friends? A health care provider?  What pleasures do you get from smoking? What ways can you still get pleasure if you quit? Here are some questions to ask your health care provider:  How can you help me to be successful at quitting?  What medicine do you think would be best for me and how should I take it?  What should I do if I need more help?  What is smoking withdrawal like? How can I get information on withdrawal? GET READY  Set a quit date.  Change your environment by getting rid of all cigarettes, ashtrays, matches, and lighters in your home, car, or work. Do not let people smoke in your home.  Review your past attempts to quit. Think about what worked and what did not. GET SUPPORT AND ENCOURAGEMENT You have a better chance of being successful if you have help. You can get support in many ways.  Tell your family, friends, and coworkers that you are going to quit and need their support. Ask them not to smoke around you.  Get individual, group, or telephone counseling and support. Programs are available at local hospitals and health centers. Call   your local health department for information about programs in your area.  Spiritual beliefs and practices may help some smokers quit.  Download a "quit meter" on your computer to keep track of quit statistics, such as how long you have gone without smoking, cigarettes not smoked, and money saved.  Get a self-help book about quitting smoking and staying off tobacco. LEARN NEW SKILLS AND BEHAVIORS  Distract yourself from urges to smoke. Talk to someone, go for a walk, or occupy your time with a task.  Change your normal routine. Take a different route to work.  Drink tea instead of coffee. Eat breakfast in a different place.  Reduce your stress. Take a hot bath, exercise, or read a book.  Plan something enjoyable to do every day. Reward yourself for not smoking.  Explore interactive web-based programs that specialize in helping you quit. GET MEDICINE AND USE IT CORRECTLY Medicines can help you stop smoking and decrease the urge to smoke. Combining medicine with the above behavioral methods and support can greatly increase your chances of successfully quitting smoking.  Nicotine replacement therapy helps deliver nicotine to your body without the negative effects and risks of smoking. Nicotine replacement therapy includes nicotine gum, lozenges, inhalers, nasal sprays, and skin patches. Some may be available over-the-counter and others require a prescription.  Antidepressant medicine helps people abstain from smoking, but how this works is unknown. This medicine is available by prescription.  Nicotinic receptor partial agonist medicine simulates the effect of nicotine in your brain. This medicine is available by prescription. Ask your health care provider for advice about which medicines to use and how to use them based on your health history. Your health care provider will tell you what side effects to look out for if you choose to be on a medicine or therapy. Carefully read the information on the package. Do not use any other product containing nicotine while using a nicotine replacement product.  RELAPSE OR DIFFICULT SITUATIONS Most relapses occur within the first 3 months after quitting. Do not be discouraged if you start smoking again. Remember, most people try several times before finally quitting. You may have symptoms of withdrawal because your body is used to nicotine. You may crave cigarettes, be irritable, feel very hungry, cough often, get headaches, or have difficulty concentrating. The withdrawal symptoms are only temporary. They are strongest  when you first quit, but they will go away within 10-14 days. To reduce the chances of relapse, try to:  Avoid drinking alcohol. Drinking lowers your chances of successfully quitting.  Reduce the amount of caffeine you consume. Once you quit smoking, the amount of caffeine in your body increases and can give you symptoms, such as a rapid heartbeat, sweating, and anxiety.  Avoid smokers because they can make you want to smoke.  Do not let weight gain distract you. Many smokers will gain weight when they quit, usually less than 10 pounds. Eat a healthy diet and stay active. You can always lose the weight gained after you quit.  Find ways to improve your mood other than smoking. FOR MORE INFORMATION  www.smokefree.gov  Document Released: 12/07/2001 Document Revised: 04/29/2014 Document Reviewed: 03/23/2012 ExitCare Patient Information 2015 [employer]. This information is not intended to replace advice given to you by your health care provider. Make sure you discuss any questions you have with your health care provider.  

## 2014-12-06 ENCOUNTER — Encounter: Payer: Self-pay | Admitting: Hematology & Oncology

## 2014-12-08 ENCOUNTER — Other Ambulatory Visit: Payer: Self-pay | Admitting: Physician Assistant

## 2014-12-09 NOTE — Telephone Encounter (Signed)
Rx request to pharmacy/SLS  

## 2014-12-10 ENCOUNTER — Telehealth: Payer: Self-pay | Admitting: Nurse Practitioner

## 2014-12-10 NOTE — Telephone Encounter (Addendum)
-----   Message from Volanda Napoleon, MD sent at 12/09/2014  6:25 PM EST ----- Call and let him know that he is in remission from his leukemia. Thanks  LVM on pt's personal machine and informed him to contact our office for questions and or concerns.

## 2015-01-28 ENCOUNTER — Other Ambulatory Visit: Payer: Self-pay | Admitting: Family Medicine

## 2015-01-29 NOTE — Telephone Encounter (Signed)
Rx request to pharmacy/SLS Requested drug refills are authorized, however, the patient needs further evaluation and/or laboratory testing before further refills are given. Ask him to make an appointment for this.  

## 2015-02-14 ENCOUNTER — Other Ambulatory Visit: Payer: Self-pay | Admitting: Family Medicine

## 2015-03-14 ENCOUNTER — Other Ambulatory Visit: Payer: Self-pay | Admitting: Family Medicine

## 2015-03-17 NOTE — Progress Notes (Signed)
HPI: FU coronary artery disease and atrial fibrillation. Patient had stents placed at [facility] in 2000. Records are not available. Patient seen preoperatively at [facility] on September 01 2012 and electrocardiogram showed atrial fibrillation. Note patient had an MRI in July of 2013. There were scattered numerous but small foci throughout the cerebral white matter. There was evidence of tiny chronic lacunar infarcts in the inferior left cerebellum. Echocardiogram in October of 2013 showed normal LV function and mild aortic insufficiency. TSH in September of 2013 was 1.612. Patient has been maintained on anticoagulation. Nuclear study July 2015 showed an ejection fraction of 47%. There was a fixed inferior lateral defect suggestive of scar. No ischemia. Abdominal ultrasound August 2015 showed a 3.9 cm abdominal aortic aneurysm. Since last seen,  He denies increased dyspnea. He has occasional chest tightness which she states is there 75% of the time. Not pleuritic or positional and not exertional. He has had spells where he feels "dizzy". He has fallen. He denies syncope. He has some dizziness with standing.  Current Outpatient Prescriptions  Medication Sig Dispense Refill  . acetaminophen (TYLENOL) 325 MG tablet Take 650 mg by mouth every 6 (six) hours as needed. For headache.    Marland Kitchen atorvastatin (LIPITOR) 10 MG tablet TAKE ONE TABLET BY MOUTH ONCE DAILY 30 tablet 0  . fluticasone (FLONASE) 50 MCG/ACT nasal spray Place 2 sprays into both nostrils daily. 16 g 6  . hydrochlorothiazide (HYDRODIURIL) 25 MG tablet Take 1 tablet (25 mg total) by mouth daily. 30 tablet 2  . imatinib ([name]) 100 MG tablet Take 2 tablets (200 mg total) by mouth daily. Take with meals and large glass of water.Caution:Chemotherapy. 60 tablet 6  . lisinopril (PRINIVIL,ZESTRIL) 20 MG tablet Take 1 tablet (20 mg total) by mouth 2 (two) times daily. 60 tablet 2  . meclizine (ANTIVERT) 25 MG tablet TAKE ONE TABLET  BY MOUTH THREE TIMES DAILY AS NEEDED FOR DIZZINESS 30 tablet 0  . metoprolol succinate (TOPROL-XL) 25 MG 24 hr tablet TAKE ONE TABLET BY MOUTH ONCE DAILY. MUST MAKE AN APPOINTMENT FOR EVALUATION AND LABS BEFORE FURTHER REFILLS. 30 tablet 0  . Multiple Vitamins-Minerals (MULTIVITAMIN WITH MINERALS) tablet Take 1 tablet by mouth daily.      . nitroGLYCERIN (NITROSTAT) 0.4 MG SL tablet Place 1 tablet (0.4 mg total) under the tongue every 5 (five) minutes as needed for chest pain. 25 tablet 2  . ondansetron (ZOFRAN) 4 MG tablet TAKE ONE TABLET BY MOUTH EVERY 8 HOURS AS NEEDED FOR NAUSEA AND VOMITING 20 tablet 0  . Probiotic Product (PROBIOTIC FORMULA PO) Take by mouth daily.    . rivaroxaban (XARELTO) 20 MG TABS tablet Take 1 tablet (20 mg total) by mouth daily with supper. 30 tablet 6   No current facility-administered medications for this visit.     Past Medical History  Diagnosis Date  . CML (chronic myelocytic leukemia) 12/16/2011  . History of chicken pox   . Hypertension   . Hyperlipidemia   . History of kidney stones   . Coronary heart disease   . Atrial fibrillation   . IBS (irritable bowel syndrome) 09/20/2013  . Grief reaction 03/18/2014  . Medicare annual wellness visit, subsequent 03/23/2014    Past Surgical History  Procedure Laterality Date  . Cholecystectomy  2003  . Coronary angioplasty with stent placement  2000    History   Social History  . Marital Status: Widowed    Spouse Name: N/A  .  Number of Children: 3  . Years of Education: N/A   Occupational History  .      Retired   Social History Main Topics  . Smoking status: Current Every Day Smoker -- 0.50 packs/day    Types: Cigarettes    Start date: 10/30/1967  . Smokeless tobacco: Never Used     Comment: 12-15 still smoking  . Alcohol Use: 0.0 oz/week    0 Standard drinks or equivalent per week     Comment: Rare  . Drug Use: No  . Sexual Activity: No     Comment: wife died in 29-Jan-2023 married 24 years     Other Topics Concern  . Not on file   Social History Narrative    ROS: no fevers or chills, productive cough, hemoptysis, dysphasia, odynophagia, melena, hematochezia, dysuria, hematuria, rash, seizure activity, orthopnea, PND, pedal edema, claudication. Remaining systems are negative.  Physical Exam: Well-developed well-nourished in no acute distress.  Skin is warm and dry.  HEENT is normal.  Neck is supple.  Chest is clear to auscultation with normal expansion.  Cardiovascular exam is regular rate and rhythm.  Abdominal exam nontender or distended. No masses palpated. Extremities show no edema. neuro grossly intact  ECG sinus bradycardia at a rate of 53. Left ventricular hypertrophy. Left anterior fascicular block. Right bundle branch block.

## 2015-03-19 ENCOUNTER — Ambulatory Visit (INDEPENDENT_AMBULATORY_CARE_PROVIDER_SITE_OTHER): Payer: Commercial Managed Care - HMO | Admitting: Cardiology

## 2015-03-19 ENCOUNTER — Encounter: Payer: Self-pay | Admitting: Cardiology

## 2015-03-19 ENCOUNTER — Encounter: Payer: Self-pay | Admitting: *Deleted

## 2015-03-19 VITALS — BP 140/78 | HR 53 | Ht 68.0 in | Wt 189.8 lb

## 2015-03-19 DIAGNOSIS — Z720 Tobacco use: Secondary | ICD-10-CM

## 2015-03-19 DIAGNOSIS — R079 Chest pain, unspecified: Secondary | ICD-10-CM | POA: Diagnosis not present

## 2015-03-19 DIAGNOSIS — R42 Dizziness and giddiness: Secondary | ICD-10-CM | POA: Diagnosis not present

## 2015-03-19 DIAGNOSIS — I714 Abdominal aortic aneurysm, without rupture, unspecified: Secondary | ICD-10-CM

## 2015-03-19 DIAGNOSIS — I7143 Infrarenal abdominal aortic aneurysm, without rupture: Secondary | ICD-10-CM | POA: Insufficient documentation

## 2015-03-19 NOTE — Assessment & Plan Note (Signed)
Patient counseled on discontinuing. 

## 2015-03-19 NOTE — Assessment & Plan Note (Signed)
Continue statin. Not on aspirin given need for anticoagulation. 

## 2015-03-19 NOTE — Patient Instructions (Signed)
Your physician recommends that you schedule a follow-up appointment in: [place] HCTZ  Your physician has requested that you have en exercise stress myoview. For further information please visit [email or web address]. Please follow instruction sheet, as given.   Your physician has recommended that you wear an event monitor. Event monitors are medical devices that record the heart's electrical activity. Doctors most often [place] these monitors to diagnose arrhythmias. Arrhythmias are problems with the speed or rhythm of the heartbeat. The monitor is a small, portable device. You can wear one while you do your normal daily activities. This is usually used to diagnose what is causing palpitations/syncope (passing out).

## 2015-03-19 NOTE — Assessment & Plan Note (Signed)
Repeat abdominal ultrasound August 2016.

## 2015-03-19 NOTE — Assessment & Plan Note (Signed)
Patient describes some chest tightness. Schedule nuclear study for risk stratification.

## 2015-03-19 NOTE — Assessment & Plan Note (Signed)
Patient has had dizzy spells and fallen. He denies syncope. He has some orthostatic symptoms. Discontinue HCTZ to see if this improves. Follow blood pressure. I will also schedule a [facility].

## 2015-03-19 NOTE — Assessment & Plan Note (Signed)
Plan to continue low-dose beta blocker. Continue xarelto.

## 2015-03-31 ENCOUNTER — Other Ambulatory Visit (HOSPITAL_BASED_OUTPATIENT_CLINIC_OR_DEPARTMENT_OTHER): Payer: Commercial Managed Care - HMO

## 2015-03-31 ENCOUNTER — Encounter: Payer: Self-pay | Admitting: Hematology & Oncology

## 2015-03-31 ENCOUNTER — Ambulatory Visit (HOSPITAL_BASED_OUTPATIENT_CLINIC_OR_DEPARTMENT_OTHER): Payer: Commercial Managed Care - HMO | Admitting: Hematology & Oncology

## 2015-03-31 VITALS — BP 186/76 | HR 59 | Temp 98.2°F | Resp 20 | Ht 68.0 in | Wt 198.0 lb

## 2015-03-31 DIAGNOSIS — C9210 Chronic myeloid leukemia, BCR/ABL-positive, not having achieved remission: Secondary | ICD-10-CM | POA: Diagnosis not present

## 2015-03-31 DIAGNOSIS — C9290 Myeloid leukemia, unspecified, not having achieved remission: Secondary | ICD-10-CM

## 2015-03-31 DIAGNOSIS — I480 Paroxysmal atrial fibrillation: Secondary | ICD-10-CM

## 2015-03-31 DIAGNOSIS — I152 Hypertension secondary to endocrine disorders: Secondary | ICD-10-CM

## 2015-03-31 LAB — CBC WITH DIFFERENTIAL (CANCER CENTER ONLY)
BASO#: 0.1 10*3/uL (ref 0.0–0.2)
BASO%: 0.8 % (ref 0.0–2.0)
EOS%: 2.6 % (ref 0.0–7.0)
Eosinophils Absolute: 0.3 10*3/uL (ref 0.0–0.5)
HCT: 36.5 % — ABNORMAL LOW (ref 38.7–49.9)
HEMOGLOBIN: 12.2 g/dL — AB (ref 13.0–17.1)
LYMPH#: 2 10*3/uL (ref 0.9–3.3)
LYMPH%: 19.3 % (ref 14.0–48.0)
MCH: 30.8 pg (ref 28.0–33.4)
MCHC: 33.4 g/dL (ref 32.0–35.9)
MCV: 92 fL (ref 82–98)
MONO#: 0.7 10*3/uL (ref 0.1–0.9)
MONO%: 6.8 % (ref 0.0–13.0)
NEUT#: 7.2 10*3/uL — ABNORMAL HIGH (ref 1.5–6.5)
NEUT%: 70.5 % (ref 40.0–80.0)
Platelets: 222 10*3/uL (ref 145–400)
RBC: 3.96 10*6/uL — ABNORMAL LOW (ref 4.20–5.70)
RDW: 14 % (ref 11.1–15.7)
WBC: 10.3 10*3/uL — ABNORMAL HIGH (ref 4.0–10.0)

## 2015-03-31 LAB — CMP (CANCER CENTER ONLY)
ALK PHOS: 66 U/L (ref 26–84)
ALT(SGPT): 13 U/L (ref 10–47)
AST: 15 U/L (ref 11–38)
Albumin: 3.9 g/dL (ref 3.3–5.5)
BILIRUBIN TOTAL: 0.6 mg/dL (ref 0.20–1.60)
BUN, Bld: 10 mg/dL (ref 7–22)
CO2: 26 mEq/L (ref 18–33)
Calcium: 9.1 mg/dL (ref 8.0–10.3)
Chloride: 109 mEq/L — ABNORMAL HIGH (ref 98–108)
Creat: 1.3 mg/dl — ABNORMAL HIGH (ref 0.6–1.2)
Glucose, Bld: 92 mg/dL (ref 73–118)
POTASSIUM: 4.2 meq/L (ref 3.3–4.7)
Sodium: 150 mEq/L — ABNORMAL HIGH (ref 128–145)
TOTAL PROTEIN: 7 g/dL (ref 6.4–8.1)

## 2015-03-31 LAB — LACTATE DEHYDROGENASE: LDH: 166 U/L (ref 94–250)

## 2015-03-31 NOTE — Progress Notes (Signed)
Hematology and Oncology Follow Up Visit  Jason [name] [id number] 06/18/1946 69 y.o. 03/31/2015   Principle Diagnosis:   Chronic phase CML  Paroxysmal atrial fibrillation  Current Therapy:    [name] 200 mg by mouth daily  Xarelto 20 mg by mouth daily     Interim History:  [name] is not doing as well as. He says that his blood pressure is too high. Again, he has atrial fibrillation. His cardiologist monitor his blood pressure. He's going for a stress test this week.  He has a little bit of a headache. He has had no renal issues.  He's had no chest pain. He's had no cough. He is still smoking.  We last saw him in December, his BCR/ABL ratio was not detectable.  He's not been rehospitalized because of atrial fibrillation.  He's had no change in bowel or bladder habits. He's had no nausea or vomiting. He's had no rashes. He's had no leg swelling. ck on smoking.  He's had no bleeding. There's been no change in bowel or bladder habits.  Overall, his performance status is ECOG 1.   Medications:  Current outpatient prescriptions:  .  acetaminophen (TYLENOL) 325 MG tablet, Take 650 mg by mouth every 6 (six) hours as needed. For headache., Disp: , Rfl:  .  atorvastatin (LIPITOR) 10 MG tablet, TAKE ONE TABLET BY MOUTH ONCE DAILY, Disp: 30 tablet, Rfl: 0 .  fluticasone (FLONASE) 50 MCG/ACT nasal spray, Place 2 sprays into both nostrils daily., Disp: 16 g, Rfl: 6 .  imatinib ([name]) 100 MG tablet, Take 2 tablets (200 mg total) by mouth daily. Take with meals and large glass of water.Caution:Chemotherapy., Disp: 60 tablet, Rfl: 6 .  lisinopril (PRINIVIL,ZESTRIL) 20 MG tablet, Take 1 tablet (20 mg total) by mouth 2 (two) times daily., Disp: 60 tablet, Rfl: 2 .  meclizine (ANTIVERT) 25 MG tablet, TAKE ONE TABLET BY MOUTH THREE TIMES DAILY AS NEEDED FOR DIZZINESS, Disp: 30 tablet, Rfl: 0 .  metoprolol succinate (TOPROL-XL) 25 MG 24 hr tablet, TAKE ONE TABLET BY MOUTH ONCE DAILY. MUST  MAKE AN APPOINTMENT FOR EVALUATION AND LABS BEFORE FURTHER REFILLS., Disp: 30 tablet, Rfl: 0 .  Multiple Vitamins-Minerals (MULTIVITAMIN WITH MINERALS) tablet, Take 1 tablet by mouth daily.  , Disp: , Rfl:  .  nitroGLYCERIN (NITROSTAT) 0.4 MG SL tablet, Place 1 tablet (0.4 mg total) under the tongue every 5 (five) minutes as needed for chest pain., Disp: 25 tablet, Rfl: 2 .  ondansetron (ZOFRAN) 4 MG tablet, TAKE ONE TABLET BY MOUTH EVERY 8 HOURS AS NEEDED FOR NAUSEA AND VOMITING, Disp: 20 tablet, Rfl: 0 .  Probiotic Product (PROBIOTIC FORMULA PO), Take by mouth daily., Disp: , Rfl:  .  rivaroxaban (XARELTO) 20 MG TABS tablet, Take 1 tablet (20 mg total) by mouth daily with supper., Disp: 30 tablet, Rfl: 6  Allergies: No Known Allergies  Past Medical History, Surgical history, Social history, and Family History were reviewed and updated.  Review of Systems: As above  Physical Exam:  height is 5\' 8"  (1.727 m) and weight is 198 lb (89.812 kg). His oral temperature is 98.2 F (36.8 C). His blood pressure is 186/76 and his pulse is 59. His respiration is 20.   Well-developed and well-nourished white woman. Head and neck exam does not her or oral lesions. He has no palpable cervical or supraclavicular lymph nodes. Lungs are clear bilaterally. Cardiac exam regular rate and rhythm with a normal S1 and S2. There are no  murmurs rubs or bruits. Abdomen is soft. He has good bowel sounds. There is no palpable abdominal mass. There is no palpable liver or spleen tip. Back exam shows no tenderness over the spine ribs or hips. Extremities shows no clubbing cyanosis or edema. Skin exam no rashes, ecchymosis or petechia. Neurological exam is nonfocal.  Lab Results  Component Value Date   WBC 10.3* 03/31/2015   HGB 12.2* 03/31/2015   HCT 36.5* 03/31/2015   MCV 92 03/31/2015   PLT 222 03/31/2015     Chemistry      Component Value Date/Time   NA 140 11/29/2014 1416   NA 143 11/01/2012 1146   K 3.6  11/29/2014 1416   K 3.0* 11/01/2012 1146   CL 105 11/29/2014 1416   CL 104 11/01/2012 1146   CO2 25 11/29/2014 1416   CO2 29 11/01/2012 1146   BUN 14 11/29/2014 1416   BUN 12 11/01/2012 1146   CREATININE 1.30 11/29/2014 1416   CREATININE 1.28 03/12/2014 0849      Component Value Date/Time   CALCIUM 9.6 11/29/2014 1416   CALCIUM 9.3 11/01/2012 1146   ALKPHOS 87 11/29/2014 1416   ALKPHOS 64 11/01/2012 1146   AST 13 11/29/2014 1416   AST 20 11/01/2012 1146   ALT 11 11/29/2014 1416   ALT 8* 11/01/2012 1146   BILITOT 0.4 11/29/2014 1416   BILITOT 0.40 11/01/2012 1146      I looked at his blood smear. His white cells appeared mature. There were no blasts. There were no nucleated red cells. Platelets looked normal.   Impression and Plan: Jason [name] is a 69 year old gentleman with chronic phase CML. He is in remission by his last analysis. He is only on 200 mg a day.  We will continue him on the [place].  I'm not sure why his weight is up. I am not sure as to why he blood pressure is so high.  He goes to see his cardiologist on Friday. He is suppose have a stress test done. At this point time, I think that they will adjust his blood pressure medications.  I will plan to get him back in 4 months now. I really believe that he should be stable.   Volanda Napoleon, MD 4/4/201612:18 PM

## 2015-04-03 ENCOUNTER — Encounter (INDEPENDENT_AMBULATORY_CARE_PROVIDER_SITE_OTHER): Payer: Commercial Managed Care - HMO

## 2015-04-03 ENCOUNTER — Encounter: Payer: Self-pay | Admitting: *Deleted

## 2015-04-03 ENCOUNTER — Ambulatory Visit (HOSPITAL_COMMUNITY): Payer: Commercial Managed Care - HMO | Attending: Cardiology | Admitting: Radiology

## 2015-04-03 DIAGNOSIS — R42 Dizziness and giddiness: Secondary | ICD-10-CM | POA: Diagnosis not present

## 2015-04-03 DIAGNOSIS — R079 Chest pain, unspecified: Secondary | ICD-10-CM | POA: Diagnosis not present

## 2015-04-03 MED ORDER — TECHNETIUM TC 99M SESTAMIBI GENERIC - CARDIOLITE
33.0000 | Freq: Once | INTRAVENOUS | Status: AC | PRN
  Administered 2015-04-03: 33 via INTRAVENOUS

## 2015-04-03 MED ORDER — TECHNETIUM TC 99M SESTAMIBI GENERIC - CARDIOLITE
11.0000 | Freq: Once | INTRAVENOUS | Status: AC | PRN
  Administered 2015-04-03: 11 via INTRAVENOUS

## 2015-04-03 NOTE — Progress Notes (Signed)
[facility] [address] [place], [place] [address] [phone]    Cardiology Nuclear Med Study  Jason [name] is a 69 y.o. male     MRN : [id number]     DOB: 12-12-1946  Procedure Date: 04/03/2015  Nuclear Med Background Indication for Stress Test:  Evaluation for Ischemia History:  CAD-Stents, 07/24/14 MPI: EF: 47% SCAR, AFIB Cardiac Risk Factors: Hypertension and RBBB  Symptoms:  Chest Tightness, Dizziness and Palpitations   Nuclear Pre-Procedure Caffeine/Decaff Intake:  None NPO After: 7:00pm   Lungs:  clear O2 Sat: 98% on room air. IV 0.9% NS with Angio Cath:  22g  IV Site: R Hand  IV Started by:  Crissie Figures, RN  Chest Size (in):  44 Cup Size: n/a  Height: 5\' 8"  (1.727 m)  Weight:  191 lb (86.637 kg)  BMI:  Body mass index is 29.05 kg/(m^2). Tech Comments:  N/A    Nuclear Med Study 1 or 2 day study: 1 day  Stress Test Type:  Stress  Reading MD: N/A  Order Authorizing Provider:  Kirk Ruths, MD  Resting Radionuclide: Technetium 7m Sestamibi  Resting Radionuclide Dose: 11.0 mCi   Stress Radionuclide:  Technetium 30m Sestamibi  Stress Radionuclide Dose: 33.0 mCi           Stress Protocol Rest HR: 60 Stress HR: 144  Rest BP: 149/70 Stress BP: 202/89  Exercise Time (min): 5:15 METS: 7.0   Predicted Max HR: 153 bpm % Max HR: 94.12 bpm Rate Pressure Product: 29088   Dose of Adenosine (mg):  n/a Dose of Lexiscan: n/a mg  Dose of Atropine (mg): n/a Dose of Dobutamine: n/a mcg/kg/min (at max HR)  Stress Test Technologist: Perrin Maltese, EMT-P  Nuclear Technologist:  Earl Many, CNMT     Rest Procedure:  Myocardial perfusion imaging was performed at rest 45 minutes following the intravenous administration of Technetium 30m Sestamibi. Rest ECG: NSR-RBBB  Stress Procedure:  The patient exercised on the treadmill utilizing the Bruce Protocol for 5:15 minutes. The patient stopped due to fatigue, sob, and denied any chest pain.   Technetium 56m Sestamibi was injected at peak exercise and myocardial perfusion imaging was performed after a brief delay. Stress ECG: No significant change from baseline ECG and There are scattered PACs.  QPS Raw Data Images:  Normal; no motion artifact; normal heart/lung ratio. Stress Images:  There is a large moderate in severity defect along the inferior wall distribution seen at both rest and stress compatible with old infarct pattern. There is no peri-infarct ischemia identified. Rest Images:  Other than stated defect as above, there is homogeneous radiotracer uptake. Subtraction (SDS):  No evidence of ischemia. Transient Ischemic Dilatation (Normal <1.22):  0.95 Lung/Heart Ratio (Normal <0.45):  0.28  Quantitative Gated Spect Images QGS EDV:  164 ml QGS ESV:  90 ml  Impression Exercise Capacity:  Poor exercise capacity. BP Response:  Normal blood pressure response. Clinical Symptoms:  Fatigue, shortness of breath ECG Impression:  No significant ST segment change suggestive of ischemia. Comparison with Prior Nuclear Study: No images to compare  Overall Impression:  Intermediate risk stress nuclear study Secondary to large inferior, inferior septal/inferolateral wall defect seen at both rest and stress consistent with infarct pattern. There is no ischemia identified..  LV Ejection Fraction: 45%.  LV Wall Motion:  Mild inferior wall hypokinesis noted.  Candee Furbish, MD

## 2015-04-03 NOTE — Progress Notes (Signed)
Patient ID: Jason [name], male   DOB: November 25, 1946, 69 y.o.   MRN: [id number] [employer] verite 30 day cardiac event monitor applied to patient.

## 2015-04-04 ENCOUNTER — Emergency Department (HOSPITAL_BASED_OUTPATIENT_CLINIC_OR_DEPARTMENT_OTHER): Payer: Commercial Managed Care - HMO

## 2015-04-04 ENCOUNTER — Telehealth: Payer: Self-pay | Admitting: Cardiology

## 2015-04-04 ENCOUNTER — Other Ambulatory Visit: Payer: Self-pay | Admitting: Family Medicine

## 2015-04-04 ENCOUNTER — Emergency Department (HOSPITAL_BASED_OUTPATIENT_CLINIC_OR_DEPARTMENT_OTHER)
Admission: EM | Admit: 2015-04-04 | Discharge: 2015-04-04 | Disposition: A | Discharge status: Home / Self Care | Payer: Commercial Managed Care - HMO | Attending: Emergency Medicine | Admitting: Emergency Medicine

## 2015-04-04 ENCOUNTER — Other Ambulatory Visit: Payer: Self-pay

## 2015-04-04 ENCOUNTER — Encounter (HOSPITAL_BASED_OUTPATIENT_CLINIC_OR_DEPARTMENT_OTHER): Payer: Self-pay | Admitting: Family Medicine

## 2015-04-04 DIAGNOSIS — I2510 Atherosclerotic heart disease of native coronary artery without angina pectoris: Secondary | ICD-10-CM | POA: Insufficient documentation

## 2015-04-04 DIAGNOSIS — E785 Hyperlipidemia, unspecified: Secondary | ICD-10-CM | POA: Insufficient documentation

## 2015-04-04 DIAGNOSIS — Z7901 Long term (current) use of anticoagulants: Secondary | ICD-10-CM | POA: Insufficient documentation

## 2015-04-04 DIAGNOSIS — Z79899 Other long term (current) drug therapy: Secondary | ICD-10-CM | POA: Insufficient documentation

## 2015-04-04 DIAGNOSIS — R0602 Shortness of breath: Secondary | ICD-10-CM | POA: Diagnosis not present

## 2015-04-04 DIAGNOSIS — Z8619 Personal history of other infectious and parasitic diseases: Secondary | ICD-10-CM | POA: Diagnosis not present

## 2015-04-04 DIAGNOSIS — K5900 Constipation, unspecified: Secondary | ICD-10-CM

## 2015-04-04 DIAGNOSIS — Z8659 Personal history of other mental and behavioral disorders: Secondary | ICD-10-CM | POA: Diagnosis not present

## 2015-04-04 DIAGNOSIS — Z856 Personal history of leukemia: Secondary | ICD-10-CM | POA: Diagnosis not present

## 2015-04-04 DIAGNOSIS — Z9861 Coronary angioplasty status: Secondary | ICD-10-CM | POA: Insufficient documentation

## 2015-04-04 DIAGNOSIS — Z7951 Long term (current) use of inhaled steroids: Secondary | ICD-10-CM | POA: Diagnosis not present

## 2015-04-04 DIAGNOSIS — R0989 Other specified symptoms and signs involving the circulatory and respiratory systems: Secondary | ICD-10-CM | POA: Diagnosis not present

## 2015-04-04 DIAGNOSIS — I10 Essential (primary) hypertension: Secondary | ICD-10-CM | POA: Diagnosis not present

## 2015-04-04 DIAGNOSIS — Z720 Tobacco use: Secondary | ICD-10-CM | POA: Insufficient documentation

## 2015-04-04 LAB — CBC WITH DIFFERENTIAL/PLATELET
Basophils Absolute: 0.1 10*3/uL (ref 0.0–0.1)
Basophils Relative: 1 % (ref 0–1)
EOS ABS: 0.3 10*3/uL (ref 0.0–0.7)
Eosinophils Relative: 3 % (ref 0–5)
HCT: 38.5 % — ABNORMAL LOW (ref 39.0–52.0)
Hemoglobin: 12.8 g/dL — ABNORMAL LOW (ref 13.0–17.0)
LYMPHS ABS: 2 10*3/uL (ref 0.7–4.0)
Lymphocytes Relative: 19 % (ref 12–46)
MCH: 30.2 pg (ref 26.0–34.0)
MCHC: 33.2 g/dL (ref 30.0–36.0)
MCV: 90.8 fL (ref 78.0–100.0)
Monocytes Absolute: 0.8 10*3/uL (ref 0.1–1.0)
Monocytes Relative: 8 % (ref 3–12)
Neutro Abs: 7.3 10*3/uL (ref 1.7–7.7)
Neutrophils Relative %: 69 % (ref 43–77)
Platelets: 222 10*3/uL (ref 150–400)
RBC: 4.24 MIL/uL (ref 4.22–5.81)
RDW: 14.4 % (ref 11.5–15.5)
WBC: 10.3 10*3/uL (ref 4.0–10.5)

## 2015-04-04 LAB — BASIC METABOLIC PANEL
Anion gap: 6 (ref 5–15)
BUN: 9 mg/dL (ref 6–23)
CO2: 27 mmol/L (ref 19–32)
CREATININE: 1.2 mg/dL (ref 0.50–1.35)
Calcium: 9 mg/dL (ref 8.4–10.5)
Chloride: 111 mmol/L (ref 96–112)
GFR calc Af Amer: 71 mL/min — ABNORMAL LOW (ref >90)
GFR, EST NON AFRICAN AMERICAN: 61 mL/min — AB (ref >90)
Glucose, Bld: 94 mg/dL (ref 70–99)
POTASSIUM: 3.9 mmol/L (ref 3.5–5.1)
Sodium: 144 mmol/L (ref 135–145)

## 2015-04-04 LAB — TROPONIN I

## 2015-04-04 MED ORDER — METOPROLOL SUCCINATE ER 50 MG PO TB24: 50.0000 mg | ORAL_TABLET | Freq: Every day | ORAL | Status: DC

## 2015-04-04 NOTE — ED Notes (Addendum)
Pt c/o shortness of breath and since last night that seems better since having a bowel movement today. He states he has issues with constipation that makes him nauseated and short of breath. He currently has a heart monitor on and had a stress test yesterday. His cardiologist is Dr. Jene Every. He has not taken his blood pressure med today.

## 2015-04-04 NOTE — Telephone Encounter (Signed)
Called pt, informed him of dose change recommendations & advice from physician.  E-scribed new rx strength to pharmacy, advised pt to make sure new strength filled at pharmacy. Pt voiced understanding.

## 2015-04-04 NOTE — Telephone Encounter (Signed)
[employer] monitoring called to report pt had autotrigger A fib w/ RVR, rate of 160.   They attempted to contact patient to see if association of symptoms, have not gotten in touch w/ him yet.  Awaiting fax. Will route to [employer].

## 2015-04-04 NOTE — Discharge Instructions (Signed)
Constipation Constipation is when a person:  Poops (has a bowel movement) less than 3 times a week.  Has a hard time pooping.  Has poop that is dry, hard, or bigger than normal. HOME CARE   Eat foods with a lot of fiber in them. This includes fruits, vegetables, beans, and whole grains such as brown rice.  Avoid fatty foods and foods with a lot of sugar. This includes french fries, hamburgers, cookies, candy, and soda.  If you are not getting enough fiber from food, take products with added fiber in them (supplements).  Drink enough fluid to keep your pee (urine) clear or pale yellow.  Exercise on a regular basis, or as told by your doctor.  Go to the restroom when you feel like you need to poop. Do not hold it.  Only take medicine as told by your doctor. Do not take medicines that help you poop (laxatives) without talking to your doctor first. GET HELP RIGHT AWAY IF:   You have bright red blood in your poop (stool).  Your constipation lasts more than 4 days or gets worse.  You have belly (abdominal) or butt (rectal) pain.  You have thin poop (as thin as a pencil).  You lose weight, and it cannot be explained. MAKE SURE YOU:   Understand these instructions.  Will watch your condition.  Will get help right away if you are not doing well or get worse. Document Released: 05/31/2008 Document Revised: 12/18/2013 Document Reviewed: 09/24/2013 [facility] Patient Information 2015 [place], [place]. This information is not intended to replace advice given to you by your health care provider. Make sure you discuss any questions you have with your health care provider. Shortness of Breath Shortness of breath means you have trouble breathing. It could also mean that you have a medical problem. You should get immediate medical care for shortness of breath. CAUSES   Not enough oxygen in the air such as with high altitudes or a smoke-filled room.  Certain lung diseases, infections, or  problems.  Heart disease or conditions, such as angina or heart failure.  Low red blood cells (anemia).  Poor physical fitness, which can cause shortness of breath when you exercise.  Chest or back injuries or stiffness.  Being overweight.  Smoking.  Anxiety, which can make you feel like you are not getting enough air. DIAGNOSIS  Serious medical problems can often be found during your physical exam. Tests may also be done to determine why you are having shortness of breath. Tests may include:  Chest X-rays.  Lung function tests.  Blood tests.  An electrocardiogram (ECG).  An ambulatory electrocardiogram. An ambulatory ECG records your heartbeat patterns over a 24-hour period.  Exercise testing.  A transthoracic echocardiogram (TTE). During echocardiography, sound waves are used to evaluate how blood flows through your heart.  A transesophageal echocardiogram (TEE).  Imaging scans. Your health care provider may not be able to find a cause for your shortness of breath after your exam. In this case, it is important to have a follow-up exam with your health care provider as directed.  TREATMENT  Treatment for shortness of breath depends on the cause of your symptoms and can vary greatly. HOME CARE INSTRUCTIONS   Do not smoke. Smoking is a common cause of shortness of breath. If you smoke, ask for help to quit.  Avoid being around chemicals or things that may bother your breathing, such as paint fumes and dust.  Rest as needed. Slowly resume your  usual activities.  If medicines were prescribed, take them as directed for the full length of time directed. This includes oxygen and any inhaled medicines.  Keep all follow-up appointments as directed by your health care provider. SEEK MEDICAL CARE IF:   Your condition does not improve in the time expected.  You have a hard time doing your normal activities even with rest.  You have any new symptoms. SEEK IMMEDIATE MEDICAL  CARE IF:   Your shortness of breath gets worse.  You feel light-headed, faint, or develop a cough not controlled with medicines.  You start coughing up blood.  You have pain with breathing.  You have chest pain or pain in your arms, shoulders, or abdomen.  You have a fever.  You are unable to walk up stairs or exercise the way you normally do. MAKE SURE YOU:  Understand these instructions.  Will watch your condition.  Will get help right away if you are not doing well or get worse. Document Released: 09/07/2001 Document Revised: 12/18/2013 Document Reviewed: 02/28/2012 [facility] Patient Information 2015 [place], [place]. This information is not intended to replace advice given to you by your health care provider. Make sure you discuss any questions you have with your health care provider.

## 2015-04-04 NOTE — ED Provider Notes (Signed)
CSN: [id number]     Arrival date & time 04/04/15  1033 History   First MD Initiated Contact with Patient 04/04/15 1125     Chief Complaint  Patient presents with  . Shortness of Breath     (Consider location/radiation/quality/duration/timing/severity/associated sxs/prior Treatment) HPI  This is a 69 year old male with a history of CML, hypertension, hyperlipidemia, heart disease, atrial fibrillation who presents with shortness of breath. Patient reports ongoing shortness of breath. He states that this is somewhat chronic for him but got worse yesterday. Patient had a stress test yesterday but does not know the results. He states last night he had worsening shortness of breath. At baseline he has issues with constipation and states that when he has a large bowel movement usually shortness of breath gets better. He denies any chest pain associated. Currently he states that his shortness of breath is somewhat better because he had a large bowel movement this morning.  Denies any history of leg swelling. Is on appropriate given his recent history of atrial fibrillation.  Of note, reviewed cardiology notes from yesterday. Patient has an intermediate stress test because of wall motion abnormalities from prior MI. No new inducible ischemia.  Past Medical History  Diagnosis Date  . CML (chronic myelocytic leukemia) 12/16/2011  . History of chicken pox   . Hypertension   . Hyperlipidemia   . History of kidney stones   . Coronary heart disease   . Atrial fibrillation   . IBS (irritable bowel syndrome) 09/20/2013  . Grief reaction 03/18/2014  . Medicare annual wellness visit, subsequent 03/23/2014   Past Surgical History  Procedure Laterality Date  . Cholecystectomy  2003  . Coronary angioplasty with stent placement  2000   Family History  Problem Relation Age of Onset  . Arthritis Mother   . Hypertension Mother   . Heart disease Mother     Rheumatic fever  . Rheumatic fever Mother   .  Other Mother     brain tumor  . Cancer Mother     leukemia  . Hypertension Father   . Alcohol abuse Father   . Colon cancer Neg Hx   . Breast cancer Neg Hx   . Diabetes Neg Hx   . Prostate cancer Neg Hx    History  Substance Use Topics  . Smoking status: Current Every Day Smoker -- 0.50 packs/day    Types: Cigarettes    Start date: 10/30/1967  . Smokeless tobacco: Never Used     Comment: 04-16 still smoking  . Alcohol Use: 0.0 oz/week    0 Standard drinks or equivalent per week     Comment: Rare    Review of Systems  Constitutional: Negative.  Negative for fever.  Respiratory: Positive for shortness of breath. Negative for cough and chest tightness.   Cardiovascular: Negative.  Negative for chest pain and leg swelling.  Gastrointestinal: Positive for constipation. Negative for nausea, vomiting, abdominal pain and diarrhea.  Genitourinary: Negative.  Negative for dysuria.  Musculoskeletal: Negative for back pain.  Skin: Negative for rash.  Neurological: Negative for headaches.  All other systems reviewed and are negative.     Allergies  Review of patient's allergies indicates no known allergies.  Home Medications   Prior to Admission medications   Medication Sig Start Date End Date Taking? Authorizing Provider  acetaminophen (TYLENOL) 325 MG tablet Take 650 mg by mouth every 6 (six) hours as needed. For headache.    Historical Provider, MD  atorvastatin (LIPITOR) 10 MG  tablet TAKE ONE TABLET BY MOUTH ONCE DAILY 02/14/15   Mosie Lukes, MD  fluticasone [facility]) 50 MCG/ACT nasal spray Place 2 sprays into both nostrils daily. 10/09/14   Brunetta Jeans, PA-C  imatinib ([name]) 100 MG tablet Take 2 tablets (200 mg total) by mouth daily. Take with meals and large glass of water.Caution:Chemotherapy. 10/24/14   Volanda Napoleon, MD  lisinopril (PRINIVIL,ZESTRIL) 20 MG tablet Take 1 tablet (20 mg total) by mouth 2 (two) times daily. 08/28/14   Mosie Lukes, MD  meclizine  (ANTIVERT) 25 MG tablet TAKE ONE TABLET BY MOUTH THREE TIMES DAILY AS NEEDED FOR DIZZINESS 01/29/15   Mosie Lukes, MD  metoprolol succinate (TOPROL-XL) 25 MG 24 hr tablet TAKE ONE TABLET BY MOUTH ONCE DAILY. MUST MAKE AN APPOINTMENT FOR EVALUATION AND LABS BEFORE FURTHER REFILLS. 01/29/15   Mosie Lukes, MD  Multiple Vitamins-Minerals (MULTIVITAMIN WITH MINERALS) tablet Take 1 tablet by mouth daily.      Historical Provider, MD  nitroGLYCERIN (NITROSTAT) 0.4 MG SL tablet Place 1 tablet (0.4 mg total) under the tongue every 5 (five) minutes as needed for chest pain. 07/15/14   Erlene Quan, PA-C  ondansetron (ZOFRAN) 4 MG tablet TAKE ONE TABLET BY MOUTH EVERY 8 HOURS AS NEEDED FOR NAUSEA AND VOMITING 03/14/15   Mosie Lukes, MD  Probiotic Product (PROBIOTIC FORMULA PO) Take by mouth daily.    Historical Provider, MD  rivaroxaban (XARELTO) 20 MG TABS tablet Take 1 tablet (20 mg total) by mouth daily with supper. 08/07/14   Lelon Perla, MD   BP 194/82 mmHg  Pulse 66  Temp(Src) 98.2 F (36.8 C) (Oral)  Resp 14  Ht 5\' 8"  (1.727 m)  Wt 191 lb (86.637 kg)  BMI 29.05 kg/m2  SpO2 99% Physical Exam  Constitutional: He is oriented to person, place, and time. No distress.  Elderly, no acute distress, poor dentition  HENT:  Head: Normocephalic and atraumatic.  Eyes: Pupils are equal, round, and reactive to light.  Cardiovascular: Normal rate, regular rhythm and normal heart sounds.   No murmur heard. Pulmonary/Chest: Effort normal and breath sounds normal. No respiratory distress. He has no wheezes.  Abdominal: Soft. Bowel sounds are normal. There is no tenderness. There is no rebound.  Musculoskeletal: He exhibits no edema.  Neurological: He is alert and oriented to person, place, and time.  Skin: Skin is warm and dry.  Psychiatric: He has a normal mood and affect.  Nursing note and vitals reviewed.   ED Course  Procedures (including critical care time) Labs Review Labs Reviewed  CBC  WITH DIFFERENTIAL/PLATELET - Abnormal; Notable for the following:    Hemoglobin 12.8 (*)    HCT 38.5 (*)    All other components within normal limits  BASIC METABOLIC PANEL - Abnormal; Notable for the following:    GFR calc non Af Amer 61 (*)    GFR calc Af Amer 71 (*)    All other components within normal limits  TROPONIN I    Imaging Review Dg Abd Acute W/chest  04/04/2015   CLINICAL DATA:  Difficulty breathing with constipation and pain  EXAM: DG ABDOMEN ACUTE W/ 1V CHEST  COMPARISON:  Chest radiograph June 27, 2014  FINDINGS: PA chest: Lungs are clear. Heart size and pulmonary vascularity are normal. No adenopathy.  Supine and upright abdomen: There is moderate stool throughout colon. There is no bowel dilatation or air-fluid level suggesting obstruction. No free air. There are surgical clips  in the right upper quadrant.  IMPRESSION: Bowel gas pattern unremarkable. Moderate stool in colon. No lung edema or consolidation.   Electronically Signed   By: Lowella Grip III M.D.   On: 04/04/2015 12:54     EKG Interpretation   Date/Time:  Friday April 04 2015 11:27:25 EDT Ventricular Rate:  64 PR Interval:  170 QRS Duration: 146 QT Interval:  458 QTC Calculation: 472 R Axis:   -64 Text Interpretation:  Normal sinus rhythm with sinus arrhythmia Right  bundle branch block Left anterior fascicular block Bifascicular block  Left ventricular hypertrophy Cannot rule out Septal infarct , age  undetermined Abnormal ECG No significant change since last tracing  Confirmed by HORTON  MD, Loma Sousa ([id number]) on 04/04/2015 1:39:12 PM      MDM   Final diagnoses:  SOB (shortness of breath)  Constipation, unspecified constipation type    Patient presents with shortness of breath. Reports that shortness of breath is better after a large bowel movement. Did have a stress test yesterday that was intermediate for wall motion abnormalities from prior MI. EKG is unchanged. Troponin negative. No evidence  of pulmonary edema on chest x-ray. He does have a moderate stool burden. Patient was noted to be hypertensive at 188/94 upon arrival. He has not taken his blood pressure medications this morning. Discussed with cardiology. Dr. Jene Every is supposed to give him a call but has written a note regarding medical management. Discussed this with the patient.  Doubt current shortness of breath is related to inducible ischemia. Low suspicion for PE and patient is on Xarelto. Shortness of breath appears to be related to ongoing constipation issues. Of note, patient also reporting that his rectum as well following bowel movements. Discussed with patient stool softeners and sitz baths. He is to take his blood pressure medicine at home. Patient stated understanding.  After history, exam, and medical workup I feel the patient has been appropriately medically screened and is safe for discharge home. Pertinent diagnoses were discussed with the patient. Patient was given return precautions.   Merryl Hacker, MD 04/04/15 [phone]

## 2015-04-04 NOTE — Telephone Encounter (Signed)
Monitor faxed to [facility] [address] location.  Discussed with Dr Stanford Breed and he recommends to increase his Metoprolol to 50 mg daily  Continue to wear monitor

## 2015-04-06 ENCOUNTER — Other Ambulatory Visit: Payer: Self-pay | Admitting: Family Medicine

## 2015-04-07 ENCOUNTER — Telehealth: Payer: Self-pay | Admitting: *Deleted

## 2015-04-07 ENCOUNTER — Telehealth: Payer: Self-pay | Admitting: Family Medicine

## 2015-04-07 MED ORDER — LISINOPRIL 20 MG PO TABS: 20.0000 mg | ORAL_TABLET | Freq: Two times a day (BID) | ORAL | Status: DC

## 2015-04-07 MED ORDER — MECLIZINE HCL 25 MG PO TABS: ORAL_TABLET | ORAL | Status: DC

## 2015-04-07 MED ORDER — ATORVASTATIN CALCIUM 10 MG PO TABS: 10.0000 mg | ORAL_TABLET | Freq: Every day | ORAL | Status: DC

## 2015-04-07 NOTE — Telephone Encounter (Signed)
Patient informed refills done for #30 days to cover until seen in May by PCP.

## 2015-04-07 NOTE — Telephone Encounter (Signed)
[employer] [facility]: Monitor Critical Notification rec'd 04/07/15 @ 11:15 am from transmission from 04/04/15 @ 8:05 am (ct). Reviewed by Dr. Harrington Challenger. No orders obtained.

## 2015-04-07 NOTE — Telephone Encounter (Signed)
Caller name:Ramirez Gwynne Relationship to patient:self Can be [phone] Pharmacy: Paediatric nurse on [address] in [place]  Reason for call:Requesting refills on lipitor and lisinopril, pt does have appointment on 05/08/15. Can he have refill to get him till that appointment.  Also stating he is on something for dizziness, meclizine 25mg 

## 2015-04-17 ENCOUNTER — Other Ambulatory Visit: Payer: Self-pay | Admitting: Family Medicine

## 2015-04-28 ENCOUNTER — Other Ambulatory Visit: Payer: Self-pay | Admitting: Family Medicine

## 2015-04-28 ENCOUNTER — Telehealth: Payer: Self-pay | Admitting: *Deleted

## 2015-04-28 NOTE — Telephone Encounter (Signed)
Received monitor report of atrial fib with [facility] on 4/29 at 8:51 PM central time. Comments indicate pt is at home, sitting, watching TV.  I placed call to pt and left message who answered his phone to call office. Person who answered phone states this is the correct phone number and pt is just not home at current time. Discussed with Jason [name] at [place] and will fax strips to her.

## 2015-04-28 NOTE — Telephone Encounter (Signed)
Follow up ° ° ° ° °Returning a nurses call °

## 2015-04-28 NOTE — Telephone Encounter (Signed)
Received monitor strips.Will give to [facility] for Dr.Crenshaw to review.

## 2015-04-28 NOTE — Telephone Encounter (Signed)
Spoke with pt who reports he is feeling fine.  Did feel heart rate speed up on 4/29 but then it returned to normal

## 2015-04-30 NOTE — Telephone Encounter (Signed)
Strips reviewed by dr Stanford Breed, no changes at this time

## 2015-05-08 ENCOUNTER — Encounter: Payer: Self-pay | Admitting: Family Medicine

## 2015-05-08 ENCOUNTER — Encounter: Payer: Self-pay | Admitting: Internal Medicine

## 2015-05-08 ENCOUNTER — Ambulatory Visit (INDEPENDENT_AMBULATORY_CARE_PROVIDER_SITE_OTHER): Payer: Commercial Managed Care - HMO | Admitting: Family Medicine

## 2015-05-08 ENCOUNTER — Other Ambulatory Visit: Payer: Self-pay | Admitting: Family Medicine

## 2015-05-08 VITALS — BP 178/96 | HR 62 | Temp 98.5°F | Ht 68.0 in | Wt 198.0 lb

## 2015-05-08 DIAGNOSIS — E782 Mixed hyperlipidemia: Secondary | ICD-10-CM | POA: Diagnosis not present

## 2015-05-08 DIAGNOSIS — D649 Anemia, unspecified: Secondary | ICD-10-CM

## 2015-05-08 DIAGNOSIS — Z720 Tobacco use: Secondary | ICD-10-CM

## 2015-05-08 DIAGNOSIS — K219 Gastro-esophageal reflux disease without esophagitis: Secondary | ICD-10-CM | POA: Diagnosis not present

## 2015-05-08 DIAGNOSIS — I10 Essential (primary) hypertension: Secondary | ICD-10-CM | POA: Diagnosis not present

## 2015-05-08 DIAGNOSIS — I152 Hypertension secondary to endocrine disorders: Secondary | ICD-10-CM

## 2015-05-08 DIAGNOSIS — I480 Paroxysmal atrial fibrillation: Secondary | ICD-10-CM

## 2015-05-08 DIAGNOSIS — E876 Hypokalemia: Secondary | ICD-10-CM

## 2015-05-08 HISTORY — DX: Anemia, unspecified: D64.9

## 2015-05-08 LAB — COMPREHENSIVE METABOLIC PANEL
ALK PHOS: 85 U/L (ref 39–117)
ALT: 11 U/L (ref 0–53)
AST: 16 U/L (ref 0–37)
Albumin: 4.2 g/dL (ref 3.5–5.2)
BILIRUBIN TOTAL: 0.5 mg/dL (ref 0.2–1.2)
BUN: 10 mg/dL (ref 6–23)
CO2: 27 mEq/L (ref 19–32)
Calcium: 9.7 mg/dL (ref 8.4–10.5)
Chloride: 109 mEq/L (ref 96–112)
Creatinine, Ser: 1.21 mg/dL (ref 0.40–1.50)
GFR: 63.55 mL/min (ref >60.00)
Glucose, Bld: 99 mg/dL (ref 70–99)
Potassium: 5 mEq/L (ref 3.5–5.1)
Sodium: 143 mEq/L (ref 135–145)
TOTAL PROTEIN: 7.4 g/dL (ref 6.0–8.3)

## 2015-05-08 LAB — LIPID PANEL
CHOL/HDL RATIO: 3
CHOLESTEROL: 120 mg/dL (ref 0–200)
HDL: 36.6 mg/dL — ABNORMAL LOW (ref >39.00)
LDL Cholesterol: 69 mg/dL (ref 0–99)
NonHDL: 83.4
Triglycerides: 70 mg/dL (ref 0.0–149.0)
VLDL: 14 mg/dL (ref 0.0–40.0)

## 2015-05-08 LAB — CBC
HCT: 37.8 % — ABNORMAL LOW (ref 39.0–52.0)
HEMOGLOBIN: 12.6 g/dL — AB (ref 13.0–17.0)
MCHC: 33.2 g/dL (ref 30.0–36.0)
MCV: 90.7 fl (ref 78.0–100.0)
Platelets: 236 10*3/uL (ref 150.0–400.0)
RBC: 4.17 Mil/uL — ABNORMAL LOW (ref 4.22–5.81)
RDW: 15 % (ref 11.5–15.5)
WBC: 9.5 10*3/uL (ref 4.0–10.5)

## 2015-05-08 LAB — TSH: TSH: 1.43 u[IU]/mL (ref 0.35–4.50)

## 2015-05-08 MED ORDER — LOSARTAN POTASSIUM 100 MG PO TABS: 100.0000 mg | ORAL_TABLET | Freq: Every day | ORAL | Status: DC

## 2015-05-08 NOTE — Assessment & Plan Note (Signed)
Encouraged complete cessation. Discussed need to quit as relates to risk of numerous cancers, cardiac and pulmonary disease as well as neurologic complications. Counseled for greater than 3 minutes 

## 2015-05-08 NOTE — Assessment & Plan Note (Signed)
Check CMP today 

## 2015-05-08 NOTE — Assessment & Plan Note (Signed)
Rate controlled, tolerating xaralto

## 2015-05-08 NOTE — Patient Instructions (Addendum)
Minimize caffeine and sodium   NEEDS NURSE VISIT TO CHECK BP IN 3-4 weeks  Consider a probiotic a generic is fine. Add a fiber supplement such as Benefiber or Metamucil Nicotine Addiction Nicotine can act as both a stimulant (excites/activates) and a sedative (calms/quiets). Immediately after exposure to nicotine, there is a "kick" caused in part by the drug's stimulation of the adrenal glands and resulting discharge of adrenaline (epinephrine). The rush of adrenaline stimulates the body and causes a sudden release of sugar. This means that smokers are always slightly hyperglycemic. Hyperglycemic means that the blood sugar is high, just like in diabetics. Nicotine also decreases the amount of insulin which helps control sugar levels in the body. There is an increase in blood pressure, breathing, and the rate of heart beats.  In addition, nicotine indirectly causes a release of dopamine in the brain that controls pleasure and motivation. A similar reaction is seen with other drugs of abuse, such as cocaine and heroin. This dopamine release is thought to cause the pleasurable sensations when smoking. In some different cases, nicotine can also create a calming effect, depending on sensitivity of the smoker's nervous system and the dose of nicotine taken. WHAT HAPPENS WHEN NICOTINE IS TAKEN FOR LONG PERIODS OF TIME?  Long-term use of nicotine results in addiction. It is difficult to stop.  Repeated use of nicotine creates tolerance. Higher doses of nicotine are needed to get the "kick." When nicotine use is stopped, withdrawal may last a month or more. Withdrawal may begin within a few hours after the last cigarette. Symptoms peak within the first few days and may lessen within a few weeks. For some people, however, symptoms may last for months or longer. Withdrawal symptoms include:   Irritability.  Craving.  Learning and attention deficits.  Sleep disturbances.  Increased appetite. Craving for  tobacco may last for 6 months or longer. Many behaviors done while using nicotine can also play a part in the severity of withdrawal symptoms. For some people, the feel, smell, and sight of a cigarette and the ritual of obtaining, handling, lighting, and smoking the cigarette are closely linked with the pleasure of smoking. When stopped, they also miss the related behaviors which make the withdrawal or craving worse. While nicotine gum and patches may lessen the drug aspects of withdrawal, cravings often persist. WHAT ARE THE MEDICAL CONSEQUENCES OF NICOTINE USE?  Nicotine addiction accounts for one-third of all cancers. The top cancer caused by tobacco is lung cancer. Lung cancer is the number one cancer killer of both men and women.  Smoking is also associated with cancers of the:  Mouth.  Pharynx.  Larynx.  Esophagus.  Stomach.  Pancreas.  Cervix.  Kidney.  Ureter.  Bladder.  Smoking also causes lung diseases such as lasting (chronic) bronchitis and emphysema.  It worsens asthma in adults and children.  Smoking increases the risk of heart disease, including:  Stroke.  Heart attack.  Vascular disease.  Aneurysm.  Passive or secondary smoke can also increase medical risks including:  Asthma in children.  Sudden Infant Death Syndrome (SIDS).  Additionally, dropped cigarettes are the leading cause of residential fire fatalities.  Nicotine poisoning has been reported from accidental ingestion of tobacco products by children and pets. Death usually results in a few minutes from respiratory failure (when a person stops breathing) caused by paralysis. TREATMENT   Medication. Nicotine replacement medicines such as nicotine gum and the patch are used to stop smoking. These medicines gradually lower the  dosage of nicotine in the body. These medicines do not contain the carbon monoxide and other toxins found in tobacco smoke.  Hypnotherapy.  Relaxation  therapy.  Nicotine Anonymous (a 12-step support program). Find times and locations in your local yellow pages. Document Released: 08/18/2004 Document Revised: 03/06/2012 Document Reviewed: 02/08/2014 [facility] Patient Information 2015 [place], [place]. This information is not intended to replace advice given to you by your health care provider. Make sure you discuss any questions you have with your health care provider.

## 2015-05-08 NOTE — Assessment & Plan Note (Signed)
Tolerating statin, encouraged heart healthy diet, avoid trans fats, minimize simple carbs and saturated fats. Increase exercise as tolerated 

## 2015-05-08 NOTE — Progress Notes (Signed)
Pre visit review using our clinic review tool, if applicable. No additional management support is needed unless otherwise documented below in the visit note. 

## 2015-05-08 NOTE — Assessment & Plan Note (Signed)
Avoid offending foods, start probiotics. Do not eat large meals in late evening and consider raising head of bed. Less heartburn since restarting Omeprazole

## 2015-05-08 NOTE — Assessment & Plan Note (Signed)
Higher on recheck and symptomatic, will switch from Lisinopril to Losartan 100 mg daily and see him back in a month for nurse visit. Has appt with cardiology in about 2 weeks. Minimize sodium and caffeine

## 2015-05-18 NOTE — Assessment & Plan Note (Signed)
With hem positive stool on xarelto referred to gastroenterology for further consideration. Encouraged probiotics and PPIs routinely. Monitor stool and seek care if bloody or tarry stool is persistent.

## 2015-05-18 NOTE — Progress Notes (Signed)
Jason [name]  [id number] [birth year] 05/18/2015      Progress Note-Follow Up  Subjective  Chief Complaint  Chief Complaint  Patient presents with  . Follow-up    HPI  Patient is a 69 y.o. male in today for routine medical care. Patient is in today for follow-up. Has been noting some increased weakness and dizziness lately. Has fallen once or twice in the last 6 months. Upon arising quickly. No syncope. No head injury. He does occasionally note some palpitations but these are not long-lived and there are no associated symptoms. Has an appoint with cardiology in the next couple of weeks. No other recent concerns. Denies CP/SOB/HA/congestion/fevers/GI or GU c/o. Taking meds as prescribed  Past Medical History  Diagnosis Date  . CML (chronic myelocytic leukemia) 12/16/2011  . History of chicken pox   . Hypertension   . Hyperlipidemia   . History of kidney stones   . Coronary heart disease   . Atrial fibrillation   . IBS (irritable bowel syndrome) 09/20/2013  . Grief reaction 03/18/2014  . Medicare annual wellness visit, subsequent 03/23/2014  . Anemia 05/08/2015    Past Surgical History  Procedure Laterality Date  . Cholecystectomy  2003  . Coronary angioplasty with stent placement  2000    Family History  Problem Relation Age of Onset  . Arthritis Mother   . Hypertension Mother   . Heart disease Mother     Rheumatic fever  . Rheumatic fever Mother   . Other Mother     brain tumor  . Cancer Mother     leukemia  . Hypertension Father   . Alcohol abuse Father   . Colon cancer Neg Hx   . Breast cancer Neg Hx   . Diabetes Neg Hx   . Prostate cancer Neg Hx     History   Social History  . Marital Status: Widowed    Spouse Name: N/A  . Number of Children: 3  . Years of Education: N/A   Occupational History  .      Retired   Social History Main Topics  . Smoking status: Current Every Day Smoker -- 0.50 packs/day    Types: Cigarettes    Start date: 10/30/1967   . Smokeless tobacco: Never Used     Comment: 04-16 still smoking  . Alcohol Use: 0.0 oz/week    0 Standard drinks or equivalent per week     Comment: Rare  . Drug Use: No  . Sexual Activity: No     Comment: wife died in 25-Jan-2023 married 24 years    Other Topics Concern  . Not on file   Social History Narrative    Current Outpatient Prescriptions on File Prior to Visit  Medication Sig Dispense Refill  . acetaminophen (TYLENOL) 325 MG tablet Take 650 mg by mouth every 6 (six) hours as needed. For headache.    . fluticasone (FLONASE) 50 MCG/ACT nasal spray Place 2 sprays into both nostrils daily. 16 g 6  . imatinib ([name]) 100 MG tablet Take 2 tablets (200 mg total) by mouth daily. Take with meals and large glass of water.Caution:Chemotherapy. 60 tablet 6  . metoprolol succinate (TOPROL-XL) 50 MG 24 hr tablet Take 1 tablet (50 mg total) by mouth daily. NOTE DOSE CHANGE 30 tablet 11  . Multiple Vitamins-Minerals (MULTIVITAMIN WITH MINERALS) tablet Take 1 tablet by mouth daily.      . nitroGLYCERIN (NITROSTAT) 0.4 MG SL tablet Place 1 tablet (0.4 mg total) under the  tongue every 5 (five) minutes as needed for chest pain. 25 tablet 2  . omeprazole (PRILOSEC) 20 MG capsule TAKE ONE CAPSULE BY MOUTH ONCE DAILY 30 capsule 0  . ondansetron (ZOFRAN) 4 MG tablet TAKE ONE TABLET BY MOUTH EVERY 8 HOURS AS NEEDED FOR NAUSEA AND VOMITING 20 tablet 0  . Probiotic Product (PROBIOTIC FORMULA PO) Take by mouth daily.    . rivaroxaban (XARELTO) 20 MG TABS tablet Take 1 tablet (20 mg total) by mouth daily with supper. 30 tablet 6   No current facility-administered medications on file prior to visit.    No Known Allergies  Review of Systems  Review of Systems  Constitutional: Positive for malaise/fatigue. Negative for fever.  HENT: Negative for congestion.   Eyes: Negative for discharge.  Respiratory: Negative for shortness of breath.   Cardiovascular: Negative for chest pain, palpitations and leg  swelling.  Gastrointestinal: Negative for nausea, abdominal pain and diarrhea.  Genitourinary: Negative for dysuria.  Musculoskeletal: Positive for falls.  Skin: Negative for rash.  Neurological: Positive for weakness. Negative for loss of consciousness and headaches.  Endo/Heme/Allergies: Negative for polydipsia.  Psychiatric/Behavioral: Negative for depression and suicidal ideas. The patient is not nervous/anxious and does not have insomnia.     Objective  BP 178/96 mmHg  Pulse 62  Temp(Src) 98.5 F (36.9 C) (Oral)  Ht 5\' 8"  (1.727 m)  Wt 198 lb (89.812 kg)  BMI 30.11 kg/m2  SpO2 97%  Physical Exam  Physical Exam  Constitutional: He is oriented to person, place, and time and well-developed, well-nourished, and in no distress. No distress.  HENT:  Head: Normocephalic and atraumatic.  Eyes: Conjunctivae are normal.  Neck: Neck supple. No thyromegaly present.  Cardiovascular: Normal rate, regular rhythm and normal heart sounds.   Pulmonary/Chest: Effort normal and breath sounds normal. No respiratory distress.  Abdominal: He exhibits no distension and no mass. There is no tenderness.  Musculoskeletal: He exhibits no edema.  Neurological: He is alert and oriented to person, place, and time.  Skin: Skin is warm.  Psychiatric: Memory, affect and judgment normal.    Lab Results  Component Value Date   TSH 1.43 05/08/2015   Lab Results  Component Value Date   WBC 9.5 05/08/2015   HGB 12.6* 05/08/2015   HCT 37.8* 05/08/2015   MCV 90.7 05/08/2015   PLT 236.0 05/08/2015   Lab Results  Component Value Date   CREATININE 1.21 05/08/2015   BUN 10 05/08/2015   NA 143 05/08/2015   K 5.0 05/08/2015   CL 109 05/08/2015   CO2 27 05/08/2015   Lab Results  Component Value Date   ALT 11 05/08/2015   AST 16 05/08/2015   ALKPHOS 85 05/08/2015   BILITOT 0.5 05/08/2015   Lab Results  Component Value Date   CHOL 120 05/08/2015   Lab Results  Component Value Date   HDL  36.60* 05/08/2015   Lab Results  Component Value Date   LDLCALC 69 05/08/2015   Lab Results  Component Value Date   TRIG 70.0 05/08/2015   Lab Results  Component Value Date   CHOLHDL 3 05/08/2015     Assessment & Plan  Tobacco use Encouraged complete cessation. Discussed need to quit as relates to risk of numerous cancers, cardiac and pulmonary disease as well as neurologic complications. Counseled for greater than 3 minutes   Esophageal reflux Avoid offending foods, start probiotics. Do not eat large meals in late evening and consider raising head of bed. Less  heartburn since restarting Omeprazole   Hypokalemia Check CMP today   Hyperlipidemia, mixed Tolerating statin, encouraged heart healthy diet, avoid trans fats, minimize simple carbs and saturated fats. Increase exercise as tolerated   Atrial fibrillation Rate controlled, tolerating xaralto    HTN (hypertension) Higher on recheck and symptomatic, will switch from Lisinopril to Losartan 100 mg daily and see him back in a month for nurse visit. Has appt with cardiology in about 2 weeks. Minimize sodium and caffeine   Anemia With hem positive stool on xarelto referred to gastroenterology for further consideration. Encouraged probiotics and PPIs routinely. Monitor stool and seek care if bloody or tarry stool is persistent.

## 2015-05-21 ENCOUNTER — Other Ambulatory Visit: Payer: Self-pay | Admitting: Family Medicine

## 2015-05-21 NOTE — Telephone Encounter (Signed)
Pt would like a refill of his Zofran. States you mentioned to him to call in if he needed a refill. Please advise thank you.

## 2015-05-23 ENCOUNTER — Other Ambulatory Visit (INDEPENDENT_AMBULATORY_CARE_PROVIDER_SITE_OTHER): Payer: Commercial Managed Care - HMO

## 2015-05-23 ENCOUNTER — Other Ambulatory Visit: Payer: Self-pay | Admitting: Family Medicine

## 2015-05-23 DIAGNOSIS — K625 Hemorrhage of anus and rectum: Secondary | ICD-10-CM

## 2015-05-23 LAB — FECAL OCCULT BLOOD, IMMUNOCHEMICAL: Fecal Occult Bld: POSITIVE — AB

## 2015-05-23 NOTE — Telephone Encounter (Signed)
IFOB entered at [facility]

## 2015-05-27 ENCOUNTER — Encounter: Payer: Self-pay | Admitting: Family Medicine

## 2015-05-27 ENCOUNTER — Ambulatory Visit (INDEPENDENT_AMBULATORY_CARE_PROVIDER_SITE_OTHER): Payer: Commercial Managed Care - HMO | Admitting: Family Medicine

## 2015-05-27 ENCOUNTER — Other Ambulatory Visit: Payer: Self-pay | Admitting: Family Medicine

## 2015-05-27 VITALS — BP 142/88 | HR 67 | Temp 98.2°F | Resp 18 | Ht 68.0 in | Wt 196.4 lb

## 2015-05-27 DIAGNOSIS — C9210 Chronic myeloid leukemia, BCR/ABL-positive, not having achieved remission: Secondary | ICD-10-CM

## 2015-05-27 DIAGNOSIS — D649 Anemia, unspecified: Secondary | ICD-10-CM

## 2015-05-27 DIAGNOSIS — Z720 Tobacco use: Secondary | ICD-10-CM | POA: Diagnosis not present

## 2015-05-27 DIAGNOSIS — K219 Gastro-esophageal reflux disease without esophagitis: Secondary | ICD-10-CM

## 2015-05-27 DIAGNOSIS — C9290 Myeloid leukemia, unspecified, not having achieved remission: Secondary | ICD-10-CM

## 2015-05-27 DIAGNOSIS — R110 Nausea: Secondary | ICD-10-CM

## 2015-05-27 DIAGNOSIS — E782 Mixed hyperlipidemia: Secondary | ICD-10-CM

## 2015-05-27 LAB — CBC
HCT: 37.6 % — ABNORMAL LOW (ref 39.0–52.0)
Hemoglobin: 12.2 g/dL — ABNORMAL LOW (ref 13.0–17.0)
MCHC: 32.5 g/dL (ref 30.0–36.0)
MCV: 92.8 fl (ref 78.0–100.0)
Platelets: 237 10*3/uL (ref 150.0–400.0)
RBC: 4.05 Mil/uL — ABNORMAL LOW (ref 4.22–5.81)
RDW: 14.9 % (ref 11.5–15.5)
WBC: 10.3 10*3/uL (ref 4.0–10.5)

## 2015-05-27 LAB — RETICULOCYTES
ABS RETIC: 48.2 10*3/uL (ref 19.0–186.0)
RBC.: 4.02 MIL/uL — ABNORMAL LOW (ref 4.22–5.81)
Retic Ct Pct: 1.2 % (ref 0.4–2.3)

## 2015-05-27 LAB — IRON AND TIBC
%SAT: 23 % (ref 20–55)
IRON: 84 ug/dL (ref 42–165)
TIBC: 359 ug/dL (ref 215–435)
UIBC: 275 ug/dL (ref 125–400)

## 2015-05-27 MED ORDER — OMEPRAZOLE 40 MG PO CPDR: 40.0000 mg | DELAYED_RELEASE_CAPSULE | Freq: Every day | ORAL | Status: DC

## 2015-05-27 NOTE — Patient Instructions (Signed)

## 2015-05-27 NOTE — Assessment & Plan Note (Signed)
Rate controlled today. 

## 2015-05-27 NOTE — Assessment & Plan Note (Signed)
Follows with oncology, no new concerns.

## 2015-05-27 NOTE — Assessment & Plan Note (Signed)
Encouraged complete cessation. Discussed need to quit as relates to risk of numerous cancers, cardiac and pulmonary disease as well as neurologic complications. Counseled for greater than 3 minutes 

## 2015-05-27 NOTE — Assessment & Plan Note (Addendum)
Well controlled, no changes to meds. Encouraged heart healthy diet such as the DASH diet and exercise as tolerated. He reports he feels better, less fatigue or palpitations. Drinks 4-5 cups  Coffee each morning, encouraged to try and cut in 1/2

## 2015-05-27 NOTE — Assessment & Plan Note (Signed)
Has not needed Ondansetron recently. Encouraged to manage spicy, fatty and acidic foods

## 2015-05-27 NOTE — Assessment & Plan Note (Addendum)
Avoid offending foods, take probiotics. Do not eat large meals in late evening and consider raising head of bed. Increase Omeprazole to 40 mg due to heme positive stool. Patient does note occasional irritation of a hemorrhoid which has not been active lately

## 2015-05-27 NOTE — Progress Notes (Signed)
HPI: FU coronary artery disease and atrial fibrillation. Patient had stents placed at [facility] in 2000. Records are not available. Patient seen preoperatively at [facility] on September 01 2012 and electrocardiogram showed atrial fibrillation. Note patient had an MRI in July of 2013. There were scattered numerous but small foci throughout the cerebral white matter. There was evidence of tiny chronic lacunar infarcts in the inferior left cerebellum. Echocardiogram in October of 2013 showed normal LV function and mild aortic insufficiency. TSH in September of 2013 was 1.612. Patient has been maintained on anticoagulation. Abdominal ultrasound August 2015 showed a 3.9 cm abdominal aortic aneurysm. Nuclear study April 2016 showed large inferior, inferior septal, inferior lateral defect consistent with infarct and no ischemia. Ejection fraction 45%. Monitor April 2016 showed paroxysmal atrial fibrillation and Toprol increased. Since last seen,the patient has dyspnea with more extreme activities but not with routine activities. It is relieved with rest. It is not associated with chest pain. There is no orthopnea, PND or pedal edema. There is no syncope or palpitations. There is no exertional chest pain.   Current Outpatient Prescriptions  Medication Sig Dispense Refill  . acetaminophen (TYLENOL) 325 MG tablet Take 650 mg by mouth every 6 (six) hours as needed. For headache.    Marland Kitchen atorvastatin (LIPITOR) 10 MG tablet TAKE ONE TABLET BY MOUTH ONCE DAILY 30 tablet 6  . fluticasone (FLONASE) 50 MCG/ACT nasal spray Place 2 sprays into both nostrils daily. 16 g 6  . imatinib ([name]) 100 MG tablet Take 2 tablets (200 mg total) by mouth daily. Take with meals and large glass of water.Caution:Chemotherapy. 60 tablet 6  . losartan (COZAAR) 100 MG tablet Take 1 tablet (100 mg total) by mouth daily. 30 tablet 5  . meclizine (ANTIVERT) 25 MG tablet Take 1 tablet (25 mg total) by mouth 3 (three)  times daily. 30 tablet 0  . metoprolol succinate (TOPROL-XL) 50 MG 24 hr tablet Take 1 tablet (50 mg total) by mouth daily. NOTE DOSE CHANGE 30 tablet 11  . Multiple Vitamins-Minerals (MULTIVITAMIN WITH MINERALS) tablet Take 1 tablet by mouth daily.      . nitroGLYCERIN (NITROSTAT) 0.4 MG SL tablet Place 1 tablet (0.4 mg total) under the tongue every 5 (five) minutes as needed for chest pain. 25 tablet 2  . omeprazole (PRILOSEC) 40 MG capsule Take 1 capsule (40 mg total) by mouth daily. 30 capsule 6  . ondansetron (ZOFRAN) 4 MG tablet TAKE ONE TABLET BY MOUTH EVERY 8 HOURS AS NEEDED FOR NAUSEA AND VOMITING 20 tablet 0  . Probiotic Product (PROBIOTIC FORMULA PO) Take by mouth daily.    . rivaroxaban (XARELTO) 20 MG TABS tablet Take 1 tablet (20 mg total) by mouth daily with supper. 30 tablet 6   No current facility-administered medications for this visit.     Past Medical History  Diagnosis Date  . CML (chronic myelocytic leukemia) 12/16/2011  . History of chicken pox   . Hypertension   . Hyperlipidemia   . History of kidney stones   . Coronary heart disease   . Atrial fibrillation   . IBS (irritable bowel syndrome) 09/20/2013  . Grief reaction 03/18/2014  . Medicare annual wellness visit, subsequent 03/23/2014  . Anemia 05/08/2015    Past Surgical History  Procedure Laterality Date  . Cholecystectomy  2003  . Coronary angioplasty with stent placement  2000    History   Social History  . Marital Status: Widowed    Spouse  Name: N/A  . Number of Children: 3  . Years of Education: N/A   Occupational History  .      Retired   Social History Main Topics  . Smoking status: Current Every Day Smoker -- 0.25 packs/day    Types: Cigarettes    Start date: 10/30/1967  . Smokeless tobacco: Never Used     Comment: 04-16 still smoking  . Alcohol Use: 0.0 oz/week    0 Standard drinks or equivalent per week     Comment: Rare  . Drug Use: No  . Sexual Activity: No     Comment: wife  died in 2023-01-12 married 24 years    Other Topics Concern  . Not on file   Social History Narrative    ROS: no fevers or chills, productive cough, hemoptysis, dysphasia, odynophagia, melena, hematochezia, dysuria, hematuria, rash, seizure activity, orthopnea, PND, pedal edema, claudication. Remaining systems are negative.  Physical Exam: Well-developed well-nourished in no acute distress.  Skin is warm and dry.  HEENT is normal.  Neck is supple.  Chest is clear to auscultation with normal expansion.  Cardiovascular exam is regular rate and rhythm.  Abdominal exam nontender or distended. No masses palpated. Extremities show no edema. neuro grossly intact  ECG 04/04/2015-sinus rhythm, left anterior fascicular block, right bundle branch block, left ventricular hypertrophy.

## 2015-05-27 NOTE — Progress Notes (Signed)
Jason [name]  [id number] Dec 22, 1946 05/27/2015      Progress Note-Follow Up  Subjective  Chief Complaint  Chief Complaint  Patient presents with  . Follow-up    F/u BP med change    HPI  Patient is a 69 y.o. male in today for routine medical care. Patient is in today for follow-up generally doing well. Struggles with heartburn but it is manageable most days. No significant new concerns. No fevers or chills.  Well controlled, no changes to meds. Encouraged heart healthy diet such as the DASH diet and exercise as tolerated.  Past Medical History  Diagnosis Date  . CML (chronic myelocytic leukemia) 12/16/2011  . History of chicken pox   . Hypertension   . Hyperlipidemia   . History of kidney stones   . Coronary heart disease   . Atrial fibrillation   . IBS (irritable bowel syndrome) 09/20/2013  . Grief reaction 03/18/2014  . Medicare annual wellness visit, subsequent 03/23/2014  . Anemia 05/08/2015    Past Surgical History  Procedure Laterality Date  . Cholecystectomy  2003  . Coronary angioplasty with stent placement  2000    Family History  Problem Relation Age of Onset  . Arthritis Mother   . Hypertension Mother   . Heart disease Mother     Rheumatic fever  . Rheumatic fever Mother   . Other Mother     brain tumor  . Cancer Mother     leukemia  . Hypertension Father   . Alcohol abuse Father   . Colon cancer Neg Hx   . Breast cancer Neg Hx   . Diabetes Neg Hx   . Prostate cancer Neg Hx     History   Social History  . Marital Status: Widowed    Spouse Name: N/A  . Number of Children: 3  . Years of Education: N/A   Occupational History  .      Retired   Social History Main Topics  . Smoking status: Current Every Day Smoker -- 0.25 packs/day    Types: Cigarettes    Start date: 10/30/1967  . Smokeless tobacco: Never Used     Comment: 04-16 still smoking  . Alcohol Use: 0.0 oz/week    0 Standard drinks or equivalent per week     Comment: Rare    . Drug Use: No  . Sexual Activity: No     Comment: wife died in 02-06-23 married 24 years    Other Topics Concern  . Not on file   Social History Narrative    Current Outpatient Prescriptions on File Prior to Visit  Medication Sig Dispense Refill  . acetaminophen (TYLENOL) 325 MG tablet Take 650 mg by mouth every 6 (six) hours as needed. For headache.    Marland Kitchen atorvastatin (LIPITOR) 10 MG tablet TAKE ONE TABLET BY MOUTH ONCE DAILY 30 tablet 6  . fluticasone (FLONASE) 50 MCG/ACT nasal spray Place 2 sprays into both nostrils daily. 16 g 6  . imatinib ([name]) 100 MG tablet Take 2 tablets (200 mg total) by mouth daily. Take with meals and large glass of water.Caution:Chemotherapy. 60 tablet 6  . losartan (COZAAR) 100 MG tablet Take 1 tablet (100 mg total) by mouth daily. 30 tablet 5  . meclizine (ANTIVERT) 25 MG tablet Take 1 tablet (25 mg total) by mouth 3 (three) times daily. 30 tablet 0  . metoprolol succinate (TOPROL-XL) 50 MG 24 hr tablet Take 1 tablet (50 mg total) by mouth daily. NOTE DOSE  CHANGE 30 tablet 11  . Multiple Vitamins-Minerals (MULTIVITAMIN WITH MINERALS) tablet Take 1 tablet by mouth daily.      . nitroGLYCERIN (NITROSTAT) 0.4 MG SL tablet Place 1 tablet (0.4 mg total) under the tongue every 5 (five) minutes as needed for chest pain. 25 tablet 2  . ondansetron (ZOFRAN) 4 MG tablet TAKE ONE TABLET BY MOUTH EVERY 8 HOURS AS NEEDED FOR NAUSEA AND VOMITING 20 tablet 0  . Probiotic Product (PROBIOTIC FORMULA PO) Take by mouth daily.    . rivaroxaban (XARELTO) 20 MG TABS tablet Take 1 tablet (20 mg total) by mouth daily with supper. 30 tablet 6   No current facility-administered medications on file prior to visit.    No Known Allergies  Review of Systems  Review of Systems  Constitutional: Negative for fever and malaise/fatigue.  HENT: Negative for congestion.   Eyes: Negative for discharge.  Respiratory: Negative for shortness of breath.   Cardiovascular: Negative for  chest pain, palpitations and leg swelling.  Gastrointestinal: Positive for nausea. Negative for abdominal pain and diarrhea.  Genitourinary: Negative for dysuria.  Musculoskeletal: Negative for falls.  Skin: Negative for rash.  Neurological: Negative for loss of consciousness and headaches.  Endo/Heme/Allergies: Negative for polydipsia.  Psychiatric/Behavioral: Negative for depression and suicidal ideas. The patient is not nervous/anxious and does not have insomnia.     Objective  BP 142/88 mmHg  Pulse 67  Temp(Src) 98.2 F (36.8 C) (Oral)  Resp 18  Ht 5\' 8"  (1.727 m)  Wt 196 lb 6.4 oz (89.086 kg)  BMI 29.87 kg/m2  SpO2 98%  Physical Exam  Physical Exam  Constitutional: He is oriented to person, place, and time and well-developed, well-nourished, and in no distress. No distress.  HENT:  Head: Normocephalic and atraumatic.  Eyes: Conjunctivae are normal.  Neck: Neck supple. No thyromegaly present.  Cardiovascular: Normal rate, regular rhythm and normal heart sounds.   No murmur heard. Pulmonary/Chest: Effort normal and breath sounds normal. No respiratory distress.  Abdominal: He exhibits no distension and no mass. There is no tenderness.  Musculoskeletal: He exhibits no edema.  Neurological: He is alert and oriented to person, place, and time.  Skin: Skin is warm.  Psychiatric: Memory, affect and judgment normal.    Lab Results  Component Value Date   TSH 1.43 05/08/2015   Lab Results  Component Value Date   WBC 9.5 05/08/2015   HGB 12.6* 05/08/2015   HCT 37.8* 05/08/2015   MCV 90.7 05/08/2015   PLT 236.0 05/08/2015   Lab Results  Component Value Date   CREATININE 1.21 05/08/2015   BUN 10 05/08/2015   NA 143 05/08/2015   K 5.0 05/08/2015   CL 109 05/08/2015   CO2 27 05/08/2015   Lab Results  Component Value Date   ALT 11 05/08/2015   AST 16 05/08/2015   ALKPHOS 85 05/08/2015   BILITOT 0.5 05/08/2015   Lab Results  Component Value Date   CHOL 120  05/08/2015   Lab Results  Component Value Date   HDL 36.60* 05/08/2015   Lab Results  Component Value Date   LDLCALC 69 05/08/2015   Lab Results  Component Value Date   TRIG 70.0 05/08/2015   Lab Results  Component Value Date   CHOLHDL 3 05/08/2015     Assessment & Plan  HTN (hypertension) Well controlled, no changes to meds. Encouraged heart healthy diet such as the DASH diet and exercise as tolerated. He reports he feels better, less fatigue  or palpitations. Drinks 4-5 cups  Coffee each morning, encouraged to try and cut in 1/2   Atrial fibrillation Rate controlled today.   Tobacco use Encouraged complete cessation. Discussed need to quit as relates to risk of numerous cancers, cardiac and pulmonary disease as well as neurologic complications. Counseled for greater than 3 minutes   Esophageal reflux Avoid offending foods, take probiotics. Do not eat large meals in late evening and consider raising head of bed. Increase Omeprazole to 40 mg due to heme positive stool. Patient does note occasional irritation of a hemorrhoid which has not been active lately   Hyperlipidemia, mixed Encouraged heart healthy diet, increase exercise, avoid trans fats, consider a krill oil cap daily   Anemia Has an appt with [facility] in July, anemia stable but heme positive stool so will repeat CBC today and increase Omeprazole 40 mg daily, monitor stool for changes, bleeding etc. Follows with hematology.     CML (chronic myelocytic leukemia) Follows with oncology, no new concerns.   Nausea Has not needed Ondansetron recently. Encouraged to manage spicy, fatty and acidic foods

## 2015-05-27 NOTE — Progress Notes (Signed)
Pre visit review using our clinic review tool, if applicable. No additional management support is needed unless otherwise documented below in the visit note. 

## 2015-05-27 NOTE — Assessment & Plan Note (Signed)
Encouraged heart healthy diet, increase exercise, avoid trans fats, consider a krill oil cap daily 

## 2015-05-27 NOTE — Assessment & Plan Note (Addendum)
Has an appt with [facility] in July, anemia stable but heme positive stool so will repeat CBC today and increase Omeprazole 40 mg daily, monitor stool for changes, bleeding etc. Follows with hematology.

## 2015-05-28 ENCOUNTER — Encounter: Payer: Self-pay | Admitting: Cardiology

## 2015-05-28 ENCOUNTER — Ambulatory Visit (INDEPENDENT_AMBULATORY_CARE_PROVIDER_SITE_OTHER): Payer: Commercial Managed Care - HMO | Admitting: Cardiology

## 2015-05-28 VITALS — BP 158/86 | HR 56 | Ht 68.0 in | Wt 196.0 lb

## 2015-05-28 DIAGNOSIS — I10 Essential (primary) hypertension: Secondary | ICD-10-CM | POA: Diagnosis not present

## 2015-05-28 DIAGNOSIS — I480 Paroxysmal atrial fibrillation: Secondary | ICD-10-CM | POA: Diagnosis not present

## 2015-05-28 DIAGNOSIS — E782 Mixed hyperlipidemia: Secondary | ICD-10-CM

## 2015-05-28 DIAGNOSIS — I714 Abdominal aortic aneurysm, without rupture, unspecified: Secondary | ICD-10-CM

## 2015-05-28 DIAGNOSIS — Z720 Tobacco use: Secondary | ICD-10-CM

## 2015-05-28 DIAGNOSIS — I2510 Atherosclerotic heart disease of native coronary artery without angina pectoris: Secondary | ICD-10-CM | POA: Diagnosis not present

## 2015-05-28 DIAGNOSIS — I2583 Coronary atherosclerosis due to lipid rich plaque: Principal | ICD-10-CM

## 2015-05-28 NOTE — Assessment & Plan Note (Signed)
Continue statin. Not on aspirin given need for anticoagulation. 

## 2015-05-28 NOTE — Assessment & Plan Note (Signed)
Continue beta blocker and xarelto.  

## 2015-05-28 NOTE — Assessment & Plan Note (Signed)
Patient counseled on discontinuing. 

## 2015-05-28 NOTE — Assessment & Plan Note (Signed)
Blood pressure is mildly elevated.I have asked him to track this at home. If systolic averages greater than 481 or diastolic greater than 90 we will add additional medications most likely Norvasc.

## 2015-05-28 NOTE — Assessment & Plan Note (Signed)
Follow-up abdominal ultrasound August 2016.

## 2015-05-28 NOTE — Assessment & Plan Note (Signed)
Continue statin. 

## 2015-05-28 NOTE — Patient Instructions (Signed)
Your physician wants you to follow-up in: 6 MONTHS WITH DR CRENSHAW You will receive a reminder letter in the mail two months in advance. If you don't receive a letter, please call our office to schedule the follow-up appointment.  

## 2015-06-15 ENCOUNTER — Other Ambulatory Visit: Payer: Self-pay | Admitting: Family Medicine

## 2015-07-01 ENCOUNTER — Other Ambulatory Visit: Payer: Self-pay | Admitting: Family Medicine

## 2015-07-07 ENCOUNTER — Telehealth: Payer: Self-pay

## 2015-07-07 ENCOUNTER — Ambulatory Visit (INDEPENDENT_AMBULATORY_CARE_PROVIDER_SITE_OTHER): Payer: Medicare HMO | Admitting: Internal Medicine

## 2015-07-07 ENCOUNTER — Encounter: Payer: Self-pay | Admitting: Internal Medicine

## 2015-07-07 VITALS — BP 150/80 | HR 72 | Ht 68.0 in | Wt 198.0 lb

## 2015-07-07 DIAGNOSIS — Z1211 Encounter for screening for malignant neoplasm of colon: Secondary | ICD-10-CM | POA: Diagnosis not present

## 2015-07-07 DIAGNOSIS — K219 Gastro-esophageal reflux disease without esophagitis: Secondary | ICD-10-CM

## 2015-07-07 DIAGNOSIS — R195 Other fecal abnormalities: Secondary | ICD-10-CM | POA: Diagnosis not present

## 2015-07-07 DIAGNOSIS — Z5181 Encounter for therapeutic drug level monitoring: Secondary | ICD-10-CM | POA: Diagnosis not present

## 2015-07-07 DIAGNOSIS — Z7901 Long term (current) use of anticoagulants: Secondary | ICD-10-CM

## 2015-07-07 DIAGNOSIS — I480 Paroxysmal atrial fibrillation: Secondary | ICD-10-CM

## 2015-07-07 NOTE — Progress Notes (Signed)
HISTORY OF PRESENT ILLNESS:  Jason [name] is a 69 y.o. male with multiple significant medical problems as listed below including paroxysmal atrial fibrillation for which she is on xeralto. He is referred today by his primary care provider Dr. Willette [name] regarding anemia and Hemoccult-positive stool. The patient denies prior history of GI evaluations. He does report chronic GERD as manifested by pyrosis and intermittent regurgitation for which he takes omeprazole 40 mg daily. No esophageal dysphagia. Occasional nausea but no vomiting. Next, he describes all can bowel habits with tendency toward constipation. Occasional rectal bleeding which she attributes to hemorrhoids. Some rectal discomfort with defecation. Review of outside records from April 2016 reveals anemia with hemoglobin of 12.6. Stool Hemoccults testing returned positive. He denies melena, abdominal pain, or weight loss. No family history of colon cancer. He is a retired Jason [name]. He continues to smoke  REVIEW OF SYSTEMS:  All non-GI ROS negative except for sinus trouble, fatigue, headaches, muscle cramps, shortness of breath  Past Medical History  Diagnosis Date  . CML (chronic myelocytic leukemia) 12/16/2011  . History of chicken pox   . Hypertension   . Hyperlipidemia   . History of kidney stones   . Coronary heart disease   . Atrial fibrillation   . IBS (irritable bowel syndrome) 09/20/2013  . Grief reaction 03/18/2014  . Medicare annual wellness visit, subsequent 03/23/2014  . Anemia 05/08/2015  . GERD (gastroesophageal reflux disease)     Past Surgical History  Procedure Laterality Date  . Cholecystectomy  2003  . Coronary angioplasty with stent placement  2000    Social History Jason [name]  reports that he has been smoking Cigarettes.  He started smoking about 47 years ago. He has been smoking about 0.25 packs per day. He has never used smokeless tobacco. He reports that he does not drink alcohol or use illicit  drugs.  family history includes Alcohol abuse in his father; Arthritis in his mother; Cancer in his mother; Heart disease in his mother; Hypertension in his father and mother; Other in his mother; Rheumatic fever in his mother. There is no history of Colon cancer, Breast cancer, Diabetes, or Prostate cancer.  No Known Allergies     PHYSICAL EXAMINATION: Vital signs: BP 150/80 mmHg  Pulse 72  Ht 5\' 8"  (1.727 m)  Wt 198 lb (89.812 kg)  BMI 30.11 kg/m2  Constitutional: Chronically ill appearing, no acute distress Psychiatric: alert and oriented x3, cooperative Eyes: extraocular movements intact, anicteric, conjunctiva pink Mouth: oral pharynx moist, no lesions. Poor dentition Neck: supple no lymphadenopathy Cardiovascular: heart regular rate and rhythm, no murmur Lungs: clear to auscultation bilaterally Abdomen: soft, nontender, nondistended, no obvious ascites, no peritoneal signs, normal bowel sounds, no organomegaly Rectal: Deferred until colonoscopy Extremities: no clubbing cyanosis or lower extremity edema bilaterally. Some stasis changes Skin: no lesions on visible extremities Neuro: No focal deficits. No asterixis. Normal reflexes   ASSESSMENT:  #1. Anemia. Likely multifactorial #2. Hemoccult-positive stool. Rule out significant mucosal GI lesion #3. Multiple significant medical problems including atrial fibrillation for which he is on xeralto #4. GERD. On PPI therapy   PLAN:  #1. Colonoscopy to evaluate Hemoccult-positive stool and 5 colon cancer screening. The patient is high-risk given his comorbidities and the need to address chronic anticoagulation.The nature of the procedure, as well as the risks, benefits, and alternatives were carefully and thoroughly reviewed with the patient. Ample time for discussion and questions allowed. The patient understood, was satisfied, and agreed to proceed. #2.  Upper endoscopy to evaluate Hemoccult-positive stool and chronic GERD.  Patient is high risk as stated above.The nature of the procedure, as well as the risks, benefits, and alternatives were carefully and thoroughly reviewed with the patient. Ample time for discussion and questions allowed. The patient understood, was satisfied, and agreed to proceed. #3. Hold xeralto 2 days prior to the procedures. Confirm acceptance with his cardiologist, Jason [name]  A copy of this consultation note has been sent to Dr. Charlett [name] and Jason [name]

## 2015-07-07 NOTE — Patient Instructions (Signed)
You have been scheduled for an endoscopy and colonoscopy. Please follow the written instructions given to you at your visit today. Please pick up your prep supplies at the pharmacy within the next 1-3 days. If you use inhalers (even only as needed), please bring them with you on the day of your procedure.  

## 2015-07-07 NOTE — Telephone Encounter (Signed)
  RE: Jason [name] DOB: 12/22/1946 MRN: [id number]   Dear [name],    We have scheduled the above patient for an endoscopic procedure. Our records show that he is on anticoagulation therapy.   Please advise as to how long the patient may come off his therapy of Xarelto prior to the procedure, which is scheduled for 09/02/2015.  Please fax back/ or route the completed form to [facility] at [phone].   Sincerely,    Phillis Haggis

## 2015-07-07 NOTE — Telephone Encounter (Signed)
Hold xarelto 3 days prior to procedure and resume day after Jason [name]

## 2015-07-08 NOTE — Telephone Encounter (Signed)
Telephone encounter note routed via EPIC to [employer]. Jeffie Pollock, CMA

## 2015-07-09 NOTE — Telephone Encounter (Signed)
Spoke to patient and told him that, per [name], he should hold his Xarelto for 3 days prior to his procedure and resume it the day after.  Patient acknowledged and understood

## 2015-07-28 ENCOUNTER — Telehealth: Payer: Self-pay | Admitting: Cardiology

## 2015-07-28 ENCOUNTER — Ambulatory Visit (HOSPITAL_BASED_OUTPATIENT_CLINIC_OR_DEPARTMENT_OTHER): Payer: Medicare HMO | Admitting: Family

## 2015-07-28 ENCOUNTER — Other Ambulatory Visit (HOSPITAL_BASED_OUTPATIENT_CLINIC_OR_DEPARTMENT_OTHER): Payer: Medicare HMO

## 2015-07-28 ENCOUNTER — Encounter: Payer: Self-pay | Admitting: Family

## 2015-07-28 VITALS — BP 178/62 | HR 58 | Temp 98.4°F | Resp 20 | Ht 68.0 in | Wt 200.0 lb

## 2015-07-28 DIAGNOSIS — C9290 Myeloid leukemia, unspecified, not having achieved remission: Secondary | ICD-10-CM | POA: Diagnosis not present

## 2015-07-28 DIAGNOSIS — C9210 Chronic myeloid leukemia, BCR/ABL-positive, not having achieved remission: Secondary | ICD-10-CM

## 2015-07-28 DIAGNOSIS — I152 Hypertension secondary to endocrine disorders: Secondary | ICD-10-CM

## 2015-07-28 LAB — CHCC SATELLITE - SMEAR

## 2015-07-28 LAB — COMPREHENSIVE METABOLIC PANEL
ALT: 7 U/L — ABNORMAL LOW (ref 9–46)
AST: 13 U/L (ref 10–35)
Albumin: 4.2 g/dL (ref 3.6–5.1)
Alkaline Phosphatase: 85 U/L (ref 40–115)
BILIRUBIN TOTAL: 0.4 mg/dL (ref 0.2–1.2)
BUN: 11 mg/dL (ref 7–25)
CHLORIDE: 110 mmol/L (ref 98–110)
CO2: 25 mmol/L (ref 20–31)
CREATININE: 1.2 mg/dL (ref 0.70–1.25)
Calcium: 9.5 mg/dL (ref 8.6–10.3)
GLUCOSE: 109 mg/dL — AB (ref 65–99)
Potassium: 5 mmol/L (ref 3.5–5.3)
Sodium: 143 mmol/L (ref 135–146)
Total Protein: 7 g/dL (ref 6.1–8.1)

## 2015-07-28 LAB — CBC WITH DIFFERENTIAL (CANCER CENTER ONLY)
BASO#: 0.1 10*3/uL (ref 0.0–0.2)
BASO%: 1 % (ref 0.0–2.0)
EOS%: 4.2 % (ref 0.0–7.0)
Eosinophils Absolute: 0.4 10*3/uL (ref 0.0–0.5)
HCT: 38.3 % — ABNORMAL LOW (ref 38.7–49.9)
HGB: 12.6 g/dL — ABNORMAL LOW (ref 13.0–17.1)
LYMPH#: 2 10*3/uL (ref 0.9–3.3)
LYMPH%: 22.8 % (ref 14.0–48.0)
MCH: 30.5 pg (ref 28.0–33.4)
MCHC: 32.9 g/dL (ref 32.0–35.9)
MCV: 93 fL (ref 82–98)
MONO#: 0.6 10*3/uL (ref 0.1–0.9)
MONO%: 6.6 % (ref 0.0–13.0)
NEUT#: 5.6 10*3/uL (ref 1.5–6.5)
NEUT%: 65.4 % (ref 40.0–80.0)
Platelets: 231 10*3/uL (ref 145–400)
RBC: 4.13 10*6/uL — ABNORMAL LOW (ref 4.20–5.70)
RDW: 13.7 % (ref 11.1–15.7)
WBC: 8.6 10*3/uL (ref 4.0–10.0)

## 2015-07-28 LAB — LACTATE DEHYDROGENASE: LDH: 147 U/L (ref 94–250)

## 2015-07-28 MED ORDER — AMLODIPINE BESYLATE 5 MG PO TABS: 5.0000 mg | ORAL_TABLET | Freq: Every day | ORAL | Status: DC

## 2015-07-28 NOTE — Progress Notes (Signed)
Hematology and Oncology Follow Up Visit  DAY DEERY [id number] [birth year] 69 y.o. 07/28/2015   Principle Diagnosis:  Chronic phase CML  Current Therapy:   [name] 200 mg by mouth daily    Interim History:  Mr. Sensabaugh is here today for a follow-up. He is doing fairly well. In May he had a positive fecal occult blood test and is schedule for a colonoscopy in September. He feels that he has a bleeding hemorrhoid and has been using preporation H.  He has had no problem with infections. No fever, chills, n/v, cough, rash, dizziness, SOB, chest pain, palpitations, abdominal pain, changes in bowel or bladder habits. He has not noticed an frank blood in his urine or stool.  In April his BCR/ABL was 0.23%. We did recheck this today.  He has atrial fib and is on Xarelto daily. He states that this has been controlled.  He is still smoking but states that he has cut back quite a bit.  He plans on following up with [name] regarding his elevated BP. He has been taking his Lisinopril and metoprolol as prescribed. He states that he has headaches and "feels his heart beat" at times.  No swelling, tenderness, numbness or tingling in his extremities. No new aches or pains.  His appetite is ok and he is staying hydrated. His weight is stable.   Medications:    Medication List       This list is accurate as of: 07/28/15 11:10 AM.  Always use your most recent med list.               acetaminophen 325 MG tablet  Commonly known as:  TYLENOL  Take 650 mg by mouth every 6 (six) hours as needed. For headache.     atorvastatin 10 MG tablet  Commonly known as:  LIPITOR  TAKE ONE TABLET BY MOUTH ONCE DAILY     fluticasone 50 MCG/ACT nasal spray  Commonly known as:  FLONASE  Place 2 sprays into both nostrils daily.     imatinib 100 MG tablet  Commonly known as:  [name]  Take 2 tablets (200 mg total) by mouth daily. Take with meals and large glass of water.Caution:Chemotherapy.     losartan  100 MG tablet  Commonly known as:  COZAAR  Take 1 tablet (100 mg total) by mouth daily.     meclizine 25 MG tablet  Commonly known as:  ANTIVERT  TAKE ONE TABLET BY MOUTH THREE TIMES DAILY     metoprolol succinate 50 MG 24 hr tablet  Commonly known as:  TOPROL-XL  Take 1 tablet (50 mg total) by mouth daily. NOTE DOSE CHANGE     multivitamin with minerals tablet  Take 1 tablet by mouth daily.     nitroGLYCERIN 0.4 MG SL tablet  Commonly known as:  NITROSTAT  Place 1 tablet (0.4 mg total) under the tongue every 5 (five) minutes as needed for chest pain.     omeprazole 40 MG capsule  Commonly known as:  PRILOSEC  Take 1 capsule (40 mg total) by mouth daily.     ondansetron 4 MG tablet  Commonly known as:  ZOFRAN  TAKE ONE TABLET BY MOUTH EVERY 8 HOURS AS NEEDED FOR NAUSEA AND VOMITING     PROBIOTIC FORMULA PO  Take by mouth daily.     rivaroxaban 20 MG Tabs tablet  Commonly known as:  XARELTO  Take 1 tablet (20 mg total) by mouth daily with supper.  Allergies: No Known Allergies  Past Medical History, Surgical history, Social history, and Family History were reviewed and updated.  Review of Systems: All other 10 point review of systems is negative.   Physical Exam:  height is 5\' 8"  (1.727 m) and weight is 200 lb (90.719 kg). His oral temperature is 98.4 F (36.9 C). His blood pressure is 178/62 and his pulse is 58. His respiration is 20.   Wt Readings from Last 3 Encounters:  07/28/15 200 lb (90.719 kg)  07/07/15 198 lb (89.812 kg)  05/28/15 196 lb 0.6 oz (88.923 kg)    Ocular: Sclerae unicteric, pupils equal, round and reactive to light Ear-nose-throat: Oropharynx clear, dentition fair Lymphatic: No cervical or supraclavicular adenopathy Lungs no rales or rhonchi, good excursion bilaterally Heart regular rate and rhythm, no murmur appreciated Abd soft, nontender, positive bowel sounds MSK no focal spinal tenderness, no joint edema Neuro: non-focal,  well-oriented, appropriate affect Breasts: Deferred  Lab Results  Component Value Date   WBC 8.6 07/28/2015   HGB 12.6* 07/28/2015   HCT 38.3* 07/28/2015   MCV 93 07/28/2015   PLT 231 07/28/2015   Lab Results  Component Value Date   IRON 84 05/27/2015   TIBC 359 05/27/2015   UIBC 275 05/27/2015   IRONPCTSAT 23 05/27/2015   Lab Results  Component Value Date   RETICCTPCT 1.2 05/27/2015   RBC 4.13* 07/28/2015   RETICCTABS 48.2 05/27/2015   No results found for: KPAFRELGTCHN, LAMBDASER, KAPLAMBRATIO No results found for: IGGSERUM, IGA, IGMSERUM No results found for: Odetta Pink, SPEI   Chemistry      Component Value Date/Time   NA 143 05/08/2015 1017   NA 150* 03/31/2015 1119   K 5.0 05/08/2015 1017   K 4.2 03/31/2015 1119   CL 109 05/08/2015 1017   CL 109* 03/31/2015 1119   CO2 27 05/08/2015 1017   CO2 26 03/31/2015 1119   BUN 10 05/08/2015 1017   BUN 10 03/31/2015 1119   CREATININE 1.21 05/08/2015 1017   CREATININE 1.3* 03/31/2015 1119      Component Value Date/Time   CALCIUM 9.7 05/08/2015 1017   CALCIUM 9.1 03/31/2015 1119   ALKPHOS 85 05/08/2015 1017   ALKPHOS 66 03/31/2015 1119   AST 16 05/08/2015 1017   AST 15 03/31/2015 1119   ALT 11 05/08/2015 1017   ALT 13 03/31/2015 1119   BILITOT 0.5 05/08/2015 1017   BILITOT 0.60 03/31/2015 1119     Impression and Plan: Mr. Arns is a 69 year old gentleman with chronic phase CML. His last BCR/ABL was 0.23%. His level today is pending. He is asymptomatic at this time. We will continue him on [name] 200 mg daily.  He is following up with cardiology today regarding his HTN. He may need his medication adjusted.  He hs a colonoscopy scheduled for in September to assess for the cause of his positive hemoccult test. No anemia at this time.  We will plan to see him back in 4 months for labs and follow-up.  He knows to contact us with any questions or concerns. We can  certainly see him sooner if need be.   Eliezer Bottom, NP 8/1/201611:10 AM

## 2015-07-28 NOTE — Telephone Encounter (Signed)
Spoke with pt, Aware of dr Jacalyn Lefevre recommendations.  New script called to the pharmacy

## 2015-07-28 NOTE — Telephone Encounter (Signed)
Add amlodipine 5 mg daily and follow BP Calab Sachse  

## 2015-07-28 NOTE — Telephone Encounter (Signed)
Patient saw an MD this morning - 180/70 and then 162/70s in other arm   Patient reports his BP has been over 150   Patient reports he hasn't checked BP in a few days  Asked that patient provide home BP readings - per MD last note, he should track BP at home and parameters were average systolic >364, average diastolic > 90 - possibly add amlodipine.   Patient is going to try to obtain readings from memory from home BP machine and call with these readings or send via MyChart

## 2015-07-28 NOTE — Telephone Encounter (Signed)
Jason [name] is calling because his blood pressure was up high and [name] told him that it can be treated with Medication and wants to know do he has to see [name] again in order for that to happen . Please call  Thanks

## 2015-07-29 ENCOUNTER — Other Ambulatory Visit: Payer: Self-pay | Admitting: Family Medicine

## 2015-08-07 ENCOUNTER — Telehealth: Payer: Self-pay | Admitting: *Deleted

## 2015-08-07 DIAGNOSIS — I714 Abdominal aortic aneurysm, without rupture, unspecified: Secondary | ICD-10-CM

## 2015-08-07 NOTE — Telephone Encounter (Signed)
Left message for pt to call, he is due to have a follow up abdominal US at the [place] to f/u AAA. Order placed, pt can call [phone] to schedule His previous US was 08-13-15.

## 2015-08-11 ENCOUNTER — Other Ambulatory Visit: Payer: Self-pay | Admitting: Cardiology

## 2015-08-13 ENCOUNTER — Ambulatory Visit (HOSPITAL_BASED_OUTPATIENT_CLINIC_OR_DEPARTMENT_OTHER)
Admission: RE | Admit: 2015-08-13 | Discharge: 2015-08-13 | Disposition: A | Discharge status: Home / Self Care | Payer: Medicare HMO | Source: Ambulatory Visit | Attending: Cardiology | Admitting: Cardiology

## 2015-08-13 ENCOUNTER — Telehealth: Payer: Self-pay | Admitting: Family Medicine

## 2015-08-13 DIAGNOSIS — I714 Abdominal aortic aneurysm, without rupture, unspecified: Secondary | ICD-10-CM

## 2015-08-13 NOTE — Telephone Encounter (Signed)
Ultrasound complete. F/u doppler in one year

## 2015-08-13 NOTE — Telephone Encounter (Signed)
Pt is wanting to know what to do to help with constipation. He says that at one time he remembers being advised not to take laxatives but he isn't sure. He wanted to check with Dr. Charlett Blake before taking anything. Please call pt at [phone].

## 2015-08-14 NOTE — Telephone Encounter (Signed)
Patient informed of PCP instructions. 

## 2015-08-14 NOTE — Telephone Encounter (Signed)
For constipation recommend increased hydration and fiber (Benefiber or Metamucil) in diet. Daily probiotics such as Digestive Advantage or [facility]. If bowels not moving can use MilkOfMagnesium 2 tbls po in 4 oz of warm prune juice by mouth every 2-3 days. If no results then repeat in 4 hours with  Dulcolax suppository pr, may repeat again in 4 more hours as needed. Seek care if symptoms worsen. Consider daily Miralax and/or Dulcolax if symptoms persist.

## 2015-09-02 ENCOUNTER — Ambulatory Visit (AMBULATORY_SURGERY_CENTER): Payer: Medicare HMO | Admitting: Internal Medicine

## 2015-09-02 ENCOUNTER — Encounter: Payer: Self-pay | Admitting: Internal Medicine

## 2015-09-02 VITALS — BP 148/71 | HR 61 | Temp 97.8°F | Resp 14 | Ht 68.0 in | Wt 198.0 lb

## 2015-09-02 DIAGNOSIS — K5520 Angiodysplasia of colon without hemorrhage: Secondary | ICD-10-CM | POA: Diagnosis not present

## 2015-09-02 DIAGNOSIS — Z8673 Personal history of transient ischemic attack (TIA), and cerebral infarction without residual deficits: Secondary | ICD-10-CM | POA: Diagnosis not present

## 2015-09-02 DIAGNOSIS — K219 Gastro-esophageal reflux disease without esophagitis: Secondary | ICD-10-CM

## 2015-09-02 DIAGNOSIS — I2510 Atherosclerotic heart disease of native coronary artery without angina pectoris: Secondary | ICD-10-CM | POA: Diagnosis not present

## 2015-09-02 DIAGNOSIS — R195 Other fecal abnormalities: Secondary | ICD-10-CM | POA: Diagnosis present

## 2015-09-02 DIAGNOSIS — D125 Benign neoplasm of sigmoid colon: Secondary | ICD-10-CM

## 2015-09-02 DIAGNOSIS — D124 Benign neoplasm of descending colon: Secondary | ICD-10-CM

## 2015-09-02 DIAGNOSIS — R194 Change in bowel habit: Secondary | ICD-10-CM | POA: Diagnosis not present

## 2015-09-02 DIAGNOSIS — Z1211 Encounter for screening for malignant neoplasm of colon: Secondary | ICD-10-CM | POA: Diagnosis not present

## 2015-09-02 DIAGNOSIS — I10 Essential (primary) hypertension: Secondary | ICD-10-CM | POA: Diagnosis not present

## 2015-09-02 DIAGNOSIS — I4891 Unspecified atrial fibrillation: Secondary | ICD-10-CM | POA: Diagnosis not present

## 2015-09-02 DIAGNOSIS — E669 Obesity, unspecified: Secondary | ICD-10-CM | POA: Diagnosis not present

## 2015-09-02 MED ORDER — FERROUS SULFATE 325 (65 FE) MG PO TBEC: 325.0000 mg | DELAYED_RELEASE_TABLET | Freq: Two times a day (BID) | ORAL | Status: AC

## 2015-09-02 MED ORDER — SODIUM CHLORIDE 0.9 % IV SOLN: 500.0000 mL | INTRAVENOUS | Status: DC

## 2015-09-02 NOTE — Op Note (Signed)
[place]  [address] [place], [address]   ENDOSCOPY PROCEDURE REPORT  PATIENT: Jason [name], Jason [name]  MR#: [id number] BIRTHDATE: 12-31-1945 , 48  yrs. old GENDER: male ENDOSCOPIST: Eustace Quail, MD REFERRED BY:  [name], M.D. PROCEDURE DATE:  09/02/2015 PROCEDURE:  EGD, diagnostic ASA CLASS:     Class III INDICATIONS:  diagnostic procedure, for heme positive stool and normocytic anemia in a patient on xeralto. MEDICATIONS: Monitored anesthesia care and Propofol 100 mg IV TOPICAL ANESTHETIC: none  DESCRIPTION OF PROCEDURE: After the risks benefits and alternatives of the procedure were thoroughly explained, informed consent was obtained.  The LB [id number] endoscope was introduced through the mouth and advanced to the second portion of the duodenum , Without limitations.  The instrument was slowly withdrawn as the mucosa was fully examined.  EXAM:The esophagus revealed a distal esophageal ring which was large caliber.  The stomach revealed a sliding hiatal hernia, without erosions, but was otherwise normal.  The duodenum was normal. Retroflexed views revealed a hiatal hernia.     The scope was then withdrawn from the patient and the procedure completed.  COMPLICATIONS: There were no immediate complications.  ENDOSCOPIC IMPRESSION: 1. Large caliber distal esophageal ring. Asymptomatic 2. GERD  RECOMMENDATIONS: 1. Resume Xarelto in 1 week 2. Initiate oral iron therapy daily indefinitely. See colonoscopy report 3. Continue PPI for GERD 4. Return to the care of your primary provider. GI follow-up as needed  REPEAT EXAM:  eSigned:  Eustace Quail, MD 09/02/2015 3:33 PM    CC:The Patient and [name], MD

## 2015-09-02 NOTE — Op Note (Signed)
[facility]  [address]. [place], [address]   COLONOSCOPY PROCEDURE REPORT  PATIENT: Jason [name], Jason [name]  MR#: [id number] BIRTHDATE: 10/03/1946 , 39  yrs. old GENDER: male ENDOSCOPIST: Eustace Quail, MD REFERRED GL:OVFIE Charlett Blake, M.D. PROCEDURE DATE:  09/02/2015 PROCEDURE:   Colonoscopy, diagnostic and Colonoscopy with snare polypectomy x 3 First Screening Colonoscopy - Avg.  risk and is 50 yrs.  old or older - No.  Prior Negative Screening - Now for repeat screening. N/A  History of Adenoma - Now for follow-up colonoscopy & has been > or = to 3 yrs.  N/A  Polyps removed today? Yes ASA CLASS:   Class III INDICATIONS:e.   . Heme positive stool and normocytic anemia MEDICATIONS: Monitored anesthesia care and Propofol 300 mg IV DESCRIPTION OF PROCEDURE:   After the risks benefits and alternatives of the procedure were thoroughly explained, informed consent was obtained.  The digital rectal exam revealed no abnormalities of the rectum.   The LB [id number]  endoscope was introduced through the anus and advanced to the cecum, which was identified by both the appendix and ileocecal valve. No adverse events experienced.   The quality of the prep was good.  (MiraLax was used)  The instrument was then slowly withdrawn as the colon was fully examined. Estimated blood loss is zero unless otherwise noted in this procedure report.  COLON FINDINGS: Three polyps were found: 110mm pedunculated in the sigmoid colon and 85mm,5mm in the descending colon.  A polypectomy was performed with a hot snare for the larger polyp and cold snare for the smaller polyps.  The resection was complete, the polyp tissue was completely retrieved and sent to histology. An Endo Clip was prophylactically placed across the stalk of the sigmoid colon polyp.   A 3 mm nonbleeding angiodysplastic lesion was found at the cecum.   There was mild diverticulosis noted in the sigmoid colon. The examination was  otherwise normal.  Retroflexed views revealed internal hemorrhoids. The time to cecum = 4.5 Withdrawal time = 17.0   The scope was withdrawn and the procedure completed. COMPLICATIONS: There were no immediate complications. ENDOSCOPIC IMPRESSION: 1.   Three polyps were found in the colon; polypectomy was performed . This may result in heme positive stool 2.   Angiodysplastic lesion at the cecum. This may result in chronic blood loss and heme positive stool 3.   Mild diverticulosis in the sigmoid colon 4.   The examination was otherwise normal  RECOMMENDATIONS: 1.  Resume xeralto in 1 week. 2.  Prescribe iron sulfate 325 mg twice daily. Take indefinitely.; #60; 11 refills 2.  Repeat Colonoscopy in 3 years. 3.  Upper endoscopy today (please see report)  eSigned:  Eustace Quail, MD 09/02/2015 3:29 PM   cc: The Patient and [name], MD

## 2015-09-02 NOTE — Patient Instructions (Addendum)
YOU HAD AN ENDOSCOPIC PROCEDURE TODAY AT THE [facility]:   Refer to the procedure report that was given to you for any specific questions about what was found during the examination.  If the procedure report does not answer your questions, please call your gastroenterologist to clarify.  If you requested that your care partner not be given the details of your procedure findings, then the procedure report has been included in a sealed envelope for you to review at your convenience later.  YOU SHOULD EXPECT: Some feelings of bloating in the abdomen. Passage of more gas than usual.  Walking can help get rid of the air that was put into your GI tract during the procedure and reduce the bloating. If you had a lower endoscopy (such as a colonoscopy or flexible sigmoidoscopy) you may notice spotting of blood in your stool or on the toilet paper. If you underwent a bowel prep for your procedure, you may not have a normal bowel movement for a few days.  Please Note:  You might notice some irritation and congestion in your nose or some drainage.  This is from the oxygen used during your procedure.  There is no need for concern and it should clear up in a day or so.  SYMPTOMS TO REPORT IMMEDIATELY:   Following lower endoscopy (colonoscopy or flexible sigmoidoscopy):  Excessive amounts of blood in the stool  Significant tenderness or worsening of abdominal pains  Swelling of the abdomen that is new, acute  Fever of 100F or higher   Following upper endoscopy (EGD)  Vomiting of blood or coffee ground material  New chest pain or pain under the shoulder blades  Painful or persistently difficult swallowing  New shortness of breath  Fever of 100F or higher  Black, tarry-looking stools  For urgent or emergent issues, a gastroenterologist can be reached at any hour by calling [phone].   DIET: Your first meal following the procedure should be a small meal and then it is ok to progress to  your normal diet. Heavy or fried foods are harder to digest and may make you feel nauseous or bloated.  Likewise, meals heavy in dairy and vegetables can increase bloating.  Drink plenty of fluids but you should avoid alcoholic beverages for 24 hours.  ACTIVITY:  You should plan to take it easy for the rest of today and you should NOT DRIVE or use heavy machinery until tomorrow (because of the sedation medicines used during the test).    FOLLOW UP: Our staff will call the number listed on your records the next business day following your procedure to check on you and address any questions or concerns that you may have regarding the information given to you following your procedure. If we do not reach you, we will leave a message.  However, if you are feeling well and you are not experiencing any problems, there is no need to return our call.  We will assume that you have returned to your regular daily activities without incident.  If any biopsies were taken you will be contacted by phone or by letter within the next 1-3 weeks.  Please call us at [phone] if you have not heard about the biopsies in 3 weeks.    SIGNATURES/CONFIDENTIALITY: You and/or your care partner have signed paperwork which will be entered into your electronic medical record.  These signatures attest to the fact that that the information above on your After Visit Summary has been reviewed   and is understood.  Full responsibility of the confidentiality of this discharge information lies with you and/or your care-partner.  Polyps, diverticulosis, high fiber diet-handouts given  Repeat colonoscopy in 3 years 2019.  Take iron twice a day indefinitely.  Resume xarelto in 1 week, start back 09/09/15.  Continue omperazole for GERD.  Clip card.

## 2015-09-02 NOTE — Progress Notes (Signed)
Pt w/ discolored, missing and loose teeth. Lower incisors moderately loose but of sufficient strength to tolerate bit block.

## 2015-09-02 NOTE — Progress Notes (Signed)
Called to room to assist during endoscopic procedure.  Patient ID and intended procedure confirmed with present staff. Received instructions for my participation in the procedure from the performing physician.  

## 2015-09-02 NOTE — Progress Notes (Signed)
To recovery, report to Mirts, RN, VSS. 

## 2015-09-03 ENCOUNTER — Telehealth: Payer: Self-pay | Admitting: *Deleted

## 2015-09-03 NOTE — Telephone Encounter (Signed)
  Follow up Call-  Call back number 09/02/2015  Post procedure Call Back phone  # [phone]  Permission to leave phone message Yes     Patient questions:  Do you have a fever, pain , or abdominal swelling? No. Pain Score  0 *  Have you tolerated food without any problems? Yes.    Have you been able to return to your normal activities? Yes.    Do you have any questions about your discharge instructions: Diet   No. Medications  No. Follow up visit  No.  Do you have questions or concerns about your Care? No.  Actions: * If pain score is 4 or above: No action needed, pain <4.

## 2015-09-08 ENCOUNTER — Encounter: Payer: Self-pay | Admitting: Internal Medicine

## 2015-09-09 ENCOUNTER — Other Ambulatory Visit: Payer: Self-pay | Admitting: *Deleted

## 2015-09-09 DIAGNOSIS — C9210 Chronic myeloid leukemia, BCR/ABL-positive, not having achieved remission: Secondary | ICD-10-CM

## 2015-09-12 ENCOUNTER — Other Ambulatory Visit: Payer: Self-pay

## 2015-09-12 DIAGNOSIS — C9210 Chronic myeloid leukemia, BCR/ABL-positive, not having achieved remission: Secondary | ICD-10-CM

## 2015-09-12 MED ORDER — IMATINIB MESYLATE 100 MG PO TABS: 200.0000 mg | ORAL_TABLET | Freq: Every day | ORAL | Status: DC

## 2015-09-15 ENCOUNTER — Other Ambulatory Visit: Payer: Self-pay

## 2015-09-15 DIAGNOSIS — C9210 Chronic myeloid leukemia, BCR/ABL-positive, not having achieved remission: Secondary | ICD-10-CM

## 2015-09-15 MED ORDER — IMATINIB MESYLATE 100 MG PO TABS
200.0000 mg | ORAL_TABLET | Freq: Every day | ORAL | Status: DC
Start: 2015-09-15 — End: 2015-12-12

## 2015-09-17 ENCOUNTER — Telehealth: Payer: Self-pay | Admitting: Internal Medicine

## 2015-09-17 NOTE — Telephone Encounter (Signed)
Discussed with pt that the iron does make the stool dark. Pt verbalized understanding.

## 2015-10-16 ENCOUNTER — Other Ambulatory Visit: Payer: Self-pay | Admitting: Family Medicine

## 2015-11-06 ENCOUNTER — Telehealth: Payer: Self-pay

## 2015-11-06 NOTE — Telephone Encounter (Signed)
Pre Visit call completed. 

## 2015-11-07 ENCOUNTER — Encounter: Payer: Self-pay | Admitting: Family Medicine

## 2015-11-07 ENCOUNTER — Ambulatory Visit (INDEPENDENT_AMBULATORY_CARE_PROVIDER_SITE_OTHER): Payer: Self-pay | Admitting: Family Medicine

## 2015-11-07 VITALS — BP 140/72 | HR 71 | Temp 98.6°F | Ht 68.0 in | Wt 206.4 lb

## 2015-11-07 DIAGNOSIS — K219 Gastro-esophageal reflux disease without esophagitis: Secondary | ICD-10-CM

## 2015-11-07 DIAGNOSIS — Z1159 Encounter for screening for other viral diseases: Secondary | ICD-10-CM

## 2015-11-07 DIAGNOSIS — Z720 Tobacco use: Secondary | ICD-10-CM

## 2015-11-07 DIAGNOSIS — D649 Anemia, unspecified: Secondary | ICD-10-CM

## 2015-11-07 DIAGNOSIS — Z23 Encounter for immunization: Secondary | ICD-10-CM

## 2015-11-07 DIAGNOSIS — R110 Nausea: Secondary | ICD-10-CM

## 2015-11-07 DIAGNOSIS — H547 Unspecified visual loss: Secondary | ICD-10-CM

## 2015-11-07 DIAGNOSIS — E782 Mixed hyperlipidemia: Secondary | ICD-10-CM

## 2015-11-07 DIAGNOSIS — Z0000 Encounter for general adult medical examination without abnormal findings: Secondary | ICD-10-CM

## 2015-11-07 DIAGNOSIS — I10 Essential (primary) hypertension: Secondary | ICD-10-CM

## 2015-11-07 DIAGNOSIS — I4891 Unspecified atrial fibrillation: Secondary | ICD-10-CM

## 2015-11-07 LAB — COMPREHENSIVE METABOLIC PANEL
ALBUMIN: 4.3 g/dL (ref 3.5–5.2)
ALK PHOS: 91 U/L (ref 39–117)
ALT: 10 U/L (ref 0–53)
AST: 13 U/L (ref 0–37)
BILIRUBIN TOTAL: 0.3 mg/dL (ref 0.2–1.2)
BUN: 9 mg/dL (ref 6–23)
CO2: 29 mEq/L (ref 19–32)
Calcium: 9.2 mg/dL (ref 8.4–10.5)
Chloride: 109 mEq/L (ref 96–112)
Creatinine, Ser: 1.12 mg/dL (ref 0.40–1.50)
GFR: 69.37 mL/min (ref >60.00)
GLUCOSE: 92 mg/dL (ref 70–99)
Potassium: 4.3 mEq/L (ref 3.5–5.1)
SODIUM: 144 meq/L (ref 135–145)
TOTAL PROTEIN: 7.3 g/dL (ref 6.0–8.3)

## 2015-11-07 LAB — CBC
HEMATOCRIT: 40.2 % (ref 39.0–52.0)
HEMOGLOBIN: 12.8 g/dL — AB (ref 13.0–17.0)
MCHC: 31.8 g/dL (ref 30.0–36.0)
MCV: 92.5 fl (ref 78.0–100.0)
PLATELETS: 291 10*3/uL (ref 150.0–400.0)
RBC: 4.34 Mil/uL (ref 4.22–5.81)
RDW: 15.9 % — ABNORMAL HIGH (ref 11.5–15.5)
WBC: 10 10*3/uL (ref 4.0–10.5)

## 2015-11-07 LAB — LIPID PANEL
CHOL/HDL RATIO: 4
CHOLESTEROL: 131 mg/dL (ref 0–200)
HDL: 34.8 mg/dL — ABNORMAL LOW (ref >39.00)
LDL CALC: 80 mg/dL (ref 0–99)
NONHDL: 95.89
Triglycerides: 81 mg/dL (ref 0.0–149.0)
VLDL: 16.2 mg/dL (ref 0.0–40.0)

## 2015-11-07 LAB — TSH: TSH: 2.2 u[IU]/mL (ref 0.35–4.50)

## 2015-11-07 MED ORDER — FLUTICASONE PROPIONATE 50 MCG/ACT NA SUSP: 2.0000 | Freq: Every day | NASAL | Status: DC

## 2015-11-07 MED ORDER — LOSARTAN POTASSIUM 100 MG PO TABS: 100.0000 mg | ORAL_TABLET | Freq: Every day | ORAL | Status: DC

## 2015-11-07 MED ORDER — ATORVASTATIN CALCIUM 10 MG PO TABS: 10.0000 mg | ORAL_TABLET | Freq: Every day | ORAL | Status: DC

## 2015-11-07 NOTE — Assessment & Plan Note (Signed)
Mild, check CBC today

## 2015-11-07 NOTE — Patient Instructions (Signed)
Ginger caps or ginger ale for nausea  Preventive Care for Adults, Male A healthy lifestyle and preventive care can promote health and wellness. Preventive health guidelines for men include the following key practices:  A routine yearly physical is a good way to check with your health care provider about your health and preventative screening. It is a chance to share any concerns and updates on your health and to receive a thorough exam.  Visit your dentist for a routine exam and preventative care every 6 months. Brush your teeth twice a day and floss once a day. Good oral hygiene prevents tooth decay and gum disease.  The frequency of eye exams is based on your age, health, family medical history, use of contact lenses, and other factors. Follow your health care provider's recommendations for frequency of eye exams.  Eat a healthy diet. Foods such as vegetables, fruits, whole grains, low-fat dairy products, and lean protein foods contain the nutrients you need without too many calories. Decrease your intake of foods high in solid fats, added sugars, and salt. Eat the right amount of calories for you.Get information about a proper diet from your health care provider, if necessary.  Regular physical exercise is one of the most important things you can do for your health. Most adults should get at least 150 minutes of moderate-intensity exercise (any activity that increases your heart rate and causes you to sweat) each week. In addition, most adults need muscle-strengthening exercises on 2 or more days a week.  Maintain a healthy weight. The body mass index (BMI) is a screening tool to identify possible weight problems. It provides an estimate of body fat based on height and weight. Your health care provider can find your BMI and can help you achieve or maintain a healthy weight.For adults 20 years and older:  A BMI below 18.5 is considered underweight.  A BMI of 18.5 to 24.9 is normal.  A BMI of  25 to 29.9 is considered overweight.  A BMI of 30 and above is considered obese.  Maintain normal blood lipids and cholesterol levels by exercising and minimizing your intake of saturated fat. Eat a balanced diet with plenty of fruit and vegetables. Blood tests for lipids and cholesterol should begin at age 108 and be repeated every 5 years. If your lipid or cholesterol levels are high, you are over 50, or you are at high risk for heart disease, you may need your cholesterol levels checked more frequently.Ongoing high lipid and cholesterol levels should be treated with medicines if diet and exercise are not working.  If you smoke, find out from your health care provider how to quit. If you do not use tobacco, do not start.  Lung cancer screening is recommended for adults aged 76-80 years who are at high risk for developing lung cancer because of a history of smoking. A yearly low-dose CT scan of the lungs is recommended for people who have at least a 30-pack-year history of smoking and are a current smoker or have quit within the past 15 years. A pack year of smoking is smoking an average of 1 pack of cigarettes a day for 1 year (for example: 1 pack a day for 30 years or 2 packs a day for 15 years). Yearly screening should continue until the smoker has stopped smoking for at least 15 years. Yearly screening should be stopped for people who develop a health problem that would prevent them from having lung cancer treatment.  If you  choose to drink alcohol, do not have more than 2 drinks per day. One drink is considered to be 12 ounces (355 mL) of beer, 5 ounces (148 mL) of wine, or 1.5 ounces (44 mL) of liquor.  Avoid use of street drugs. Do not share needles with anyone. Ask for help if you need support or instructions about stopping the use of drugs.  High blood pressure causes heart disease and increases the risk of stroke. Your blood pressure should be checked at least every 1-2 years. Ongoing high  blood pressure should be treated with medicines, if weight loss and exercise are not effective.  If you are 23-73 years old, ask your health care provider if you should take aspirin to prevent heart disease.  Diabetes screening is done by taking a blood sample to check your blood glucose level after you have not eaten for a certain period of time (fasting). If you are not overweight and you do not have risk factors for diabetes, you should be screened once every 3 years starting at age 48. If you are overweight or obese and you are 37-4 years of age, you should be screened for diabetes every year as part of your cardiovascular risk assessment.  Colorectal cancer can be detected and often prevented. Most routine colorectal cancer screening begins at the age of 38 and continues through age 55. However, your health care provider may recommend screening at an earlier age if you have risk factors for colon cancer. On a yearly basis, your health care provider may provide home test kits to check for hidden blood in the stool. Use of a small camera at the end of a tube to directly examine the colon (sigmoidoscopy or colonoscopy) can detect the earliest forms of colorectal cancer. Talk to your health care provider about this at age 72, when routine screening begins. Direct exam of the colon should be repeated every 5-10 years through age 45, unless early forms of precancerous polyps or small growths are found.  People who are at an increased risk for hepatitis B should be screened for this virus. You are considered at high risk for hepatitis B if:  You were born in a country where hepatitis B occurs often. Talk with your health care provider about which countries are considered high risk.  Your parents were born in a high-risk country and you have not received a shot to protect against hepatitis B (hepatitis B vaccine).  You have HIV or AIDS.  You use needles to inject street drugs.  You live with, or have  sex with, someone who has hepatitis B.  You are a man who has sex with other men (MSM).  You get hemodialysis treatment.  You take certain medicines for conditions such as cancer, organ transplantation, and autoimmune conditions.  Hepatitis C blood testing is recommended for all people born from [birth year] through 1965 and any individual with known risks for hepatitis C.  Practice safe sex. Use condoms and avoid high-risk sexual practices to reduce the spread of sexually transmitted infections (STIs). STIs include gonorrhea, chlamydia, syphilis, trichomonas, herpes, HPV, and human immunodeficiency virus (HIV). Herpes, HIV, and HPV are viral illnesses that have no cure. They can result in disability, cancer, and death.  If you are a man who has sex with other men, you should be screened at least once per year for:  HIV.  Urethral, rectal, and pharyngeal infection of gonorrhea, chlamydia, or both.  If you are at risk of being infected with  HIV, it is recommended that you take a prescription medicine daily to prevent HIV infection. This is called preexposure prophylaxis (PrEP). You are considered at risk if:  You are a man who has sex with other men (MSM) and have other risk factors.  You are a heterosexual man, are sexually active, and are at increased risk for HIV infection.  You take drugs by injection.  You are sexually active with a partner who has HIV.  Talk with your health care provider about whether you are at high risk of being infected with HIV. If you choose to begin PrEP, you should first be tested for HIV. You should then be tested every 3 months for as long as you are taking PrEP.  A one-time screening for abdominal aortic aneurysm (AAA) and surgical repair of large AAAs by ultrasound are recommended for men ages 5 to 19 years who are current or former smokers.  Healthy men should no longer receive prostate-specific antigen (PSA) blood tests as part of routine cancer screening.  Talk with your health care provider about prostate cancer screening.  Testicular cancer screening is not recommended for adult males who have no symptoms. Screening includes self-exam, a health care provider exam, and other screening tests. Consult with your health care provider about any symptoms you have or any concerns you have about testicular cancer.  Use sunscreen. Apply sunscreen liberally and repeatedly throughout the day. You should seek shade when your shadow is shorter than you. Protect yourself by wearing long sleeves, pants, a wide-brimmed hat, and sunglasses year round, whenever you are outdoors.  Once a month, do a whole-body skin exam, using a mirror to look at the skin on your back. Tell your health care provider about new moles, moles that have irregular borders, moles that are larger than a pencil eraser, or moles that have changed in shape or color.  Stay current with required vaccines (immunizations).  Influenza vaccine. All adults should be immunized every year.  Tetanus, diphtheria, and acellular pertussis (Td, Tdap) vaccine. An adult who has not previously received Tdap or who does not know his vaccine status should receive 1 dose of Tdap. This initial dose should be followed by tetanus and diphtheria toxoids (Td) booster doses every 10 years. Adults with an unknown or incomplete history of completing a 3-dose immunization series with Td-containing vaccines should begin or complete a primary immunization series including a Tdap dose. Adults should receive a Td booster every 10 years.  Varicella vaccine. An adult without evidence of immunity to varicella should receive 2 doses or a second dose if he has previously received 1 dose.  Human papillomavirus (HPV) vaccine. Males aged 11-21 years who have not received the vaccine previously should receive the 3-dose series. Males aged 22-26 years may be immunized. Immunization is recommended through the age of 24 years for any male  who has sex with males and did not get any or all doses earlier. Immunization is recommended for any person with an immunocompromised condition through the age of 57 years if he did not get any or all doses earlier. During the 3-dose series, the second dose should be obtained 4-8 weeks after the first dose. The third dose should be obtained 24 weeks after the first dose and 16 weeks after the second dose.  Zoster vaccine. One dose is recommended for adults aged 70 years or older unless certain conditions are present.  Measles, mumps, and rubella (MMR) vaccine. Adults born before [birth year] generally are considered immune  to measles and mumps. Adults born in [birth year] or later should have 1 or more doses of MMR vaccine unless there is a contraindication to the vaccine or there is laboratory evidence of immunity to each of the three diseases. A routine second dose of MMR vaccine should be obtained at least 28 days after the first dose for students attending postsecondary schools, health care workers, or international travelers. People who received inactivated measles vaccine or an unknown type of measles vaccine during 1963-1967 should receive 2 doses of MMR vaccine. People who received inactivated mumps vaccine or an unknown type of mumps vaccine before 1979 and are at high risk for mumps infection should consider immunization with 2 doses of MMR vaccine. Unvaccinated health care workers born before [birth year] who lack laboratory evidence of measles, mumps, or rubella immunity or laboratory confirmation of disease should consider measles and mumps immunization with 2 doses of MMR vaccine or rubella immunization with 1 dose of MMR vaccine.  Pneumococcal 13-valent conjugate (PCV13) vaccine. When indicated, a person who is uncertain of his immunization history and has no record of immunization should receive the PCV13 vaccine. All adults 48 years of age and older should receive this vaccine. An adult aged 44 years or older who has  certain medical conditions and has not been previously immunized should receive 1 dose of PCV13 vaccine. This PCV13 should be followed with a dose of pneumococcal polysaccharide (PPSV23) vaccine. Adults who are at high risk for pneumococcal disease should obtain the PPSV23 vaccine at least 8 weeks after the dose of PCV13 vaccine. Adults older than 67 years of age who have normal immune system function should obtain the PPSV23 vaccine dose at least 1 year after the dose of PCV13 vaccine.  Pneumococcal polysaccharide (PPSV23) vaccine. When PCV13 is also indicated, PCV13 should be obtained first. All adults aged 13 years and older should be immunized. An adult younger than age 77 years who has certain medical conditions should be immunized. Any person who resides in a nursing home or long-term care facility should be immunized. An adult smoker should be immunized. People with an immunocompromised condition and certain other conditions should receive both PCV13 and PPSV23 vaccines. People with human immunodeficiency virus (HIV) infection should be immunized as soon as possible after diagnosis. Immunization during chemotherapy or radiation therapy should be avoided. Routine use of PPSV23 vaccine is not recommended for American Indians, [place] Natives, or people younger than 65 years unless there are medical conditions that require PPSV23 vaccine. When indicated, people who have unknown immunization and have no record of immunization should receive PPSV23 vaccine. One-time revaccination 5 years after the first dose of PPSV23 is recommended for people aged 19-64 years who have chronic kidney failure, nephrotic syndrome, asplenia, or immunocompromised conditions. People who received 1-2 doses of PPSV23 before age 54 years should receive another dose of PPSV23 vaccine at age 37 years or later if at least 5 years have passed since the previous dose. Doses of PPSV23 are not needed for people immunized with PPSV23 at or after  age 35 years.  Meningococcal vaccine. Adults with asplenia or persistent complement component deficiencies should receive 2 doses of quadrivalent meningococcal conjugate (MenACWY-D) vaccine. The doses should be obtained at least 2 months apart. Microbiologists working with certain meningococcal bacteria, [employer] recruits, people at risk during an outbreak, and people who travel to or live in countries with a high rate of meningitis should be immunized. A first-year college student up through age 21 years who is  living in a residence hall should receive a dose if he did not receive a dose on or after his 16th birthday. Adults who have certain high-risk conditions should receive one or more doses of vaccine.  Hepatitis A vaccine. Adults who wish to be protected from this disease, have chronic liver disease, work with hepatitis A-infected animals, work in hepatitis A research labs, or travel to or work in countries with a high rate of hepatitis A should be immunized. Adults who were previously unvaccinated and who anticipate close contact with an international adoptee during the first 60 days after arrival in the [place] from a country with a high rate of hepatitis A should be immunized.  Hepatitis B vaccine. Adults should be immunized if they wish to be protected from this disease, are under age 26 years and have diabetes, have chronic liver disease, have had more than one sex partner in the past 6 months, may be exposed to blood or other infectious body fluids, are household contacts or sex partners of hepatitis B positive people, are clients or workers in certain care facilities, or travel to or work in countries with a high rate of hepatitis B.  Haemophilus influenzae type b (Hib) vaccine. A previously unvaccinated person with asplenia or sickle cell disease or having a scheduled splenectomy should receive 1 dose of Hib vaccine. Regardless of previous immunization, a recipient of a hematopoietic stem  cell transplant should receive a 3-dose series 6-12 months after his successful transplant. Hib vaccine is not recommended for adults with HIV infection. Preventive Service / Frequency Ages 67 to 29  Blood pressure check.** / Every 3-5 years.  Lipid and cholesterol check.** / Every 5 years beginning at age 71.  Hepatitis C blood test.** / For any individual with known risks for hepatitis C.  Skin self-exam. / Monthly.  Influenza vaccine. / Every year.  Tetanus, diphtheria, and acellular pertussis (Tdap, Td) vaccine.** / Consult your health care provider. 1 dose of Td every 10 years.  Varicella vaccine.** / Consult your health care provider.  HPV vaccine. / 3 doses over 6 months, if 49 or younger.  Measles, mumps, rubella (MMR) vaccine.** / You need at least 1 dose of MMR if you were born in 1957 or later. You may also need a second dose.  Pneumococcal 13-valent conjugate (PCV13) vaccine.** / Consult your health care provider.  Pneumococcal polysaccharide (PPSV23) vaccine.** / 1 to 2 doses if you smoke cigarettes or if you have certain conditions.  Meningococcal vaccine.** / 1 dose if you are age 6 to 13 years and a Market researcher living in a residence hall, or have one of several medical conditions. You may also need additional booster doses.  Hepatitis A vaccine.** / Consult your health care provider.  Hepatitis B vaccine.** / Consult your health care provider.  Haemophilus influenzae type b (Hib) vaccine.** / Consult your health care provider. Ages 5 to 45  Blood pressure check.** / Every year.  Lipid and cholesterol check.** / Every 5 years beginning at age 46.  Lung cancer screening. / Every year if you are aged 83-80 years and have a 30-pack-year history of smoking and currently smoke or have quit within the past 15 years. Yearly screening is stopped once you have quit smoking for at least 15 years or develop a health problem that would prevent you from  having lung cancer treatment.  Fecal occult blood test (FOBT) of stool. / Every year beginning at age 39 and continuing until  age 34. You may not have to do this test if you get a colonoscopy every 10 years.  Flexible sigmoidoscopy** or colonoscopy.** / Every 5 years for a flexible sigmoidoscopy or every 10 years for a colonoscopy beginning at age 46 and continuing until age 37.  Hepatitis C blood test.** / For all people born from [birth year] through 1965 and any individual with known risks for hepatitis C.  Skin self-exam. / Monthly.  Influenza vaccine. / Every year.  Tetanus, diphtheria, and acellular pertussis (Tdap/Td) vaccine.** / Consult your health care provider. 1 dose of Td every 10 years.  Varicella vaccine.** / Consult your health care provider.  Zoster vaccine.** / 1 dose for adults aged 58 years or older.  Measles, mumps, rubella (MMR) vaccine.** / You need at least 1 dose of MMR if you were born in 1957 or later. You may also need a second dose.  Pneumococcal 13-valent conjugate (PCV13) vaccine.** / Consult your health care provider.  Pneumococcal polysaccharide (PPSV23) vaccine.** / 1 to 2 doses if you smoke cigarettes or if you have certain conditions.  Meningococcal vaccine.** / Consult your health care provider.  Hepatitis A vaccine.** / Consult your health care provider.  Hepatitis B vaccine.** / Consult your health care provider.  Haemophilus influenzae type b (Hib) vaccine.** / Consult your health care provider. Ages 11 and over  Blood pressure check.** / Every year.  Lipid and cholesterol check.**/ Every 5 years beginning at age 25.  Lung cancer screening. / Every year if you are aged 85-80 years and have a 30-pack-year history of smoking and currently smoke or have quit within the past 15 years. Yearly screening is stopped once you have quit smoking for at least 15 years or develop a health problem that would prevent you from having lung cancer  treatment.  Fecal occult blood test (FOBT) of stool. / Every year beginning at age 21 and continuing until age 48. You may not have to do this test if you get a colonoscopy every 10 years.  Flexible sigmoidoscopy** or colonoscopy.** / Every 5 years for a flexible sigmoidoscopy or every 10 years for a colonoscopy beginning at age 5 and continuing until age 86.  Hepatitis C blood test.** / For all people born from [birth year] through 1965 and any individual with known risks for hepatitis C.  Abdominal aortic aneurysm (AAA) screening.** / A one-time screening for ages 20 to 14 years who are current or former smokers.  Skin self-exam. / Monthly.  Influenza vaccine. / Every year.  Tetanus, diphtheria, and acellular pertussis (Tdap/Td) vaccine.** / 1 dose of Td every 10 years.  Varicella vaccine.** / Consult your health care provider.  Zoster vaccine.** / 1 dose for adults aged 63 years or older.  Pneumococcal 13-valent conjugate (PCV13) vaccine.** / 1 dose for all adults aged 6 years and older.  Pneumococcal polysaccharide (PPSV23) vaccine.** / 1 dose for all adults aged 90 years and older.  Meningococcal vaccine.** / Consult your health care provider.  Hepatitis A vaccine.** / Consult your health care provider.  Hepatitis B vaccine.** / Consult your health care provider.  Haemophilus influenzae type b (Hib) vaccine.** / Consult your health care provider. **Family history and personal history of risk and conditions may change your health care provider's recommendations.   This information is not intended to replace advice given to you by your health care provider. Make sure you discuss any questions you have with your health care provider.   Document Released: 02/08/2002 Document Revised: 01/03/2015  Document Reviewed: 05/10/2011 Elsevier Interactive Patient Education [employer].

## 2015-11-07 NOTE — Progress Notes (Signed)
Pre visit review using our clinic review tool, if applicable. No additional management support is needed unless otherwise documented below in the visit note. 

## 2015-11-08 LAB — HEPATITIS C ANTIBODY: HCV Ab: NEGATIVE

## 2015-11-11 ENCOUNTER — Other Ambulatory Visit: Payer: Self-pay | Admitting: Family Medicine

## 2015-11-16 ENCOUNTER — Encounter: Payer: Self-pay | Admitting: Family Medicine

## 2015-11-16 NOTE — Assessment & Plan Note (Addendum)
Tolerating Coumadin, rate controlled 

## 2015-11-16 NOTE — Assessment & Plan Note (Signed)
Well controlled, no changes to meds. Encouraged heart healthy diet such as the DASH diet and exercise as tolerated.  °

## 2015-11-16 NOTE — Assessment & Plan Note (Signed)
Patient denies any difficulties at home. No trouble with ADLs, depression or falls. See EMR for functional status screen and depression screen. No recent changes to vision or hearing. Is UTD with immunizations. Is UTD with screening. Discussed Advanced Directives. Encouraged heart healthy diet, exercise as tolerated and adequate sleep. See patient's problem list for health risk factors to monitor. See AVS for preventative healthcare recommendation schedule. Labs reviewed. Tdap and Pneumonia shot Allergies verified:Yes  Immunization Status: Reviewed with patient. Refused all immunizations  Prompted for insurance verification: Yes Flu vaccine-- Refused Tdap--Refused PNA--Refused Shingles--Refused   CCS:Due 2019 [id number] listed,patient states he does not remember having  Care Teams Updated: [facility]-Oncology,[facility]-Cardiologist ED/Hospital/Urgent Care Visits: None Updated insurance, contact information, forms: Advised patent to update front office staff with any new information upon arrival Remind to bring: DPR information, advance directives: Yes

## 2015-11-16 NOTE — Progress Notes (Signed)
Subjective:    Patient ID: Jason [name], male    DOB: Apr 09, 1946, 69 y.o.   MRN: [id number]  Chief Complaint  Patient presents with  . Annual Exam    HPI Patient is in today for annual wellness exam and follow-up. Generally feels physically well, no recent illness. Has occasional constipation and nausea at times. No vomiting. No abdominal pain or anorexia. Sadly his son died in a car accident at age 28 earlier this month. He is sad but feels he is managing well. No suicidal ideation. Has been maintaining a heart healthy diet and trying to stay active. No difficulties with ADLs at home. Denies CP/palp/SOB/HA/congestion/fevers/GI or GU c/o. Taking meds as prescribed  Past Medical History  Diagnosis Date  . CML (chronic myelocytic leukemia) ([place]) 12/16/2011  . History of chicken pox   . Hypertension   . Hyperlipidemia   . History of kidney stones   . Coronary heart disease   . Atrial fibrillation ([place])   . IBS (irritable bowel syndrome) 09/20/2013  . Grief reaction 03/18/2014  . Medicare annual wellness visit, subsequent 03/23/2014  . Anemia 05/08/2015  . GERD (gastroesophageal reflux disease)     Past Surgical History  Procedure Laterality Date  . Cholecystectomy  2003  . Coronary angioplasty with stent placement  2000  . Cataract extraction      Family History  Problem Relation Age of Onset  . Arthritis Mother   . Hypertension Mother   . Heart disease Mother     Rheumatic fever  . Rheumatic fever Mother   . Other Mother     brain tumor  . Cancer Mother     leukemia  . Hypertension Father   . Alcohol abuse Father   . Colon cancer Neg Hx   . Breast cancer Neg Hx   . Prostate cancer Neg Hx   . Diabetes Son     Social History   Social History  . Marital Status: Widowed    Spouse Name: N/A  . Number of Children: 3  . Years of Education: N/A   Occupational History  . Retired     Retired   Social History Main Topics  . Smoking status: Current Every Day Smoker  -- 0.25 packs/day    Types: Cigarettes    Start date: 10/30/1967  . Smokeless tobacco: Never Used     Comment: 04-16 still smoking  . Alcohol Use: No     Comment: Rare  . Drug Use: No  . Sexual Activity: No     Comment: wife died in 01/16/2023 married 38 years, grandson livew with patient   Other Topics Concern  . Not on file   Social History Narrative    Outpatient Prescriptions Prior to Visit  Medication Sig Dispense Refill  . acetaminophen (TYLENOL) 325 MG tablet Take 650 mg by mouth every 6 (six) hours as needed. For headache.    Marland Kitchen amLODipine (NORVASC) 5 MG tablet Take 1 tablet (5 mg total) by mouth daily. 30 tablet 12  . ferrous sulfate 325 (65 FE) MG EC tablet Take 1 tablet (325 mg total) by mouth 2 (two) times daily. 60 tablet 11  . imatinib ([name]) 100 MG tablet Take 2 tablets (200 mg total) by mouth daily. Take with meals and large glass of water.Caution:Chemotherapy. 60 tablet 6  . metoprolol succinate (TOPROL-XL) 50 MG 24 hr tablet Take 1 tablet (50 mg total) by mouth daily. NOTE DOSE CHANGE 30 tablet 11  . Multiple Vitamins-Minerals (MULTIVITAMIN  WITH MINERALS) tablet Take 1 tablet by mouth daily.      . nitroGLYCERIN (NITROSTAT) 0.4 MG SL tablet Place 1 tablet (0.4 mg total) under the tongue every 5 (five) minutes as needed for chest pain. 25 tablet 2  . omeprazole (PRILOSEC) 40 MG capsule Take 1 capsule (40 mg total) by mouth daily. 30 capsule 6  . ondansetron (ZOFRAN) 4 MG tablet TAKE ONE TABLET BY MOUTH EVERY 8 HOURS AS NEEDED FOR NAUSEA AND FOR VOMITING 20 tablet 0  . Probiotic Product (PROBIOTIC FORMULA PO) Take by mouth daily.    [name] 20 MG TABS tablet TAKE 1 TABLET (20 MG) BY MOUTH DAILY WITH SUPPER. 30 tablet 3  . atorvastatin (LIPITOR) 10 MG tablet TAKE ONE TABLET BY MOUTH ONCE DAILY 30 tablet 6  . fluticasone (FLONASE) 50 MCG/ACT nasal spray Place 2 sprays into both nostrils daily. 16 g 6  . losartan (COZAAR) 100 MG tablet Take 1 tablet (100 mg total) by mouth  daily. 30 tablet 5  . meclizine (ANTIVERT) 25 MG tablet TAKE ONE TABLET BY MOUTH THREE TIMES DAILY 30 tablet 2   No facility-administered medications prior to visit.    No Known Allergies  Review of Systems  Constitutional: Negative for fever and malaise/fatigue.  HENT: Negative for congestion.   Eyes: Negative for discharge.  Respiratory: Negative for shortness of breath.   Cardiovascular: Negative for chest pain, palpitations and leg swelling.  Gastrointestinal: Positive for nausea and constipation. Negative for abdominal pain, diarrhea, blood in stool and melena.  Genitourinary: Negative for dysuria.  Musculoskeletal: Negative for falls.  Skin: Negative for rash.  Neurological: Negative for loss of consciousness and headaches.  Endo/Heme/Allergies: Negative for environmental allergies.  Psychiatric/Behavioral: Negative for depression. The patient is not nervous/anxious.        Objective:    Physical Exam  Constitutional: He is oriented to person, place, and time. He appears well-developed and well-nourished. No distress.  HENT:  Head: Normocephalic and atraumatic.  Eyes: Conjunctivae are normal.  Neck: Neck supple. No thyromegaly present.  Cardiovascular: Normal rate and normal heart sounds.   No murmur heard. Irregularly irregular  Pulmonary/Chest: Effort normal and breath sounds normal. No respiratory distress. He has no wheezes.  Abdominal: Soft. Bowel sounds are normal. He exhibits no mass. There is no tenderness.  Musculoskeletal: He exhibits no edema.  Lymphadenopathy:    He has no cervical adenopathy.  Neurological: He is alert and oriented to person, place, and time.  Skin: Skin is warm and dry.  Psychiatric: He has a normal mood and affect. His behavior is normal.    BP 140/72 mmHg  Pulse 71  Temp(Src) 98.6 F (37 C) (Oral)  Ht 5\' 8"  (1.727 m)  Wt 206 lb 6 oz (93.611 kg)  BMI 31.39 kg/m2  SpO2 95% Wt Readings from Last 3 Encounters:  11/07/15 206 lb 6  oz (93.611 kg)  09/02/15 198 lb (89.812 kg)  07/28/15 200 lb (90.719 kg)     Lab Results  Component Value Date   WBC 10.0 11/07/2015   HGB 12.8* 11/07/2015   HCT 40.2 11/07/2015   PLT 291.0 11/07/2015   GLUCOSE 92 11/07/2015   CHOL 131 11/07/2015   TRIG 81.0 11/07/2015   HDL 34.80* 11/07/2015   LDLCALC 80 11/07/2015   ALT 10 11/07/2015   AST 13 11/07/2015   NA 144 11/07/2015   K 4.3 11/07/2015   CL 109 11/07/2015   CREATININE 1.12 11/07/2015   BUN 9 11/07/2015  CO2 29 11/07/2015   TSH 2.20 11/07/2015   INR 1.07 06/27/2014    Lab Results  Component Value Date   TSH 2.20 11/07/2015   Lab Results  Component Value Date   WBC 10.0 11/07/2015   HGB 12.8* 11/07/2015   HCT 40.2 11/07/2015   MCV 92.5 11/07/2015   PLT 291.0 11/07/2015   Lab Results  Component Value Date   NA 144 11/07/2015   K 4.3 11/07/2015   CO2 29 11/07/2015   GLUCOSE 92 11/07/2015   BUN 9 11/07/2015   CREATININE 1.12 11/07/2015   BILITOT 0.3 11/07/2015   ALKPHOS 91 11/07/2015   AST 13 11/07/2015   ALT 10 11/07/2015   PROT 7.3 11/07/2015   ALBUMIN 4.3 11/07/2015   CALCIUM 9.2 11/07/2015   ANIONGAP 6 04/04/2015   GFR 69.37 11/07/2015   Lab Results  Component Value Date   CHOL 131 11/07/2015   Lab Results  Component Value Date   HDL 34.80* 11/07/2015   Lab Results  Component Value Date   LDLCALC 80 11/07/2015   Lab Results  Component Value Date   TRIG 81.0 11/07/2015   Lab Results  Component Value Date   CHOLHDL 4 11/07/2015   No results found for: HGBA1C     Assessment & Plan:   Problem List Items Addressed This Visit    Anemia    Mild, check CBC today      Atrial fibrillation (HCC)    Tolerating Coumadin, rate controlled.       Relevant Medications   losartan (COZAAR) 100 MG tablet   atorvastatin (LIPITOR) 10 MG tablet   Other Relevant Orders   TSH (Completed)   CBC (Completed)   Lipid panel (Completed)   Comprehensive metabolic panel (Completed)    Esophageal reflux    Avoid offending foods, start probiotics. Do not eat large meals in late evening and consider raising head of bed.       HTN (hypertension) - Primary (Chronic)    Well controlled, no changes to meds. Encouraged heart healthy diet such as the DASH diet and exercise as tolerated.       Relevant Medications   losartan (COZAAR) 100 MG tablet   atorvastatin (LIPITOR) 10 MG tablet   Other Relevant Orders   TSH (Completed)   CBC (Completed)   Lipid panel (Completed)   Comprehensive metabolic panel (Completed)   Hyperlipidemia, mixed    Tolerating statin, encouraged heart healthy diet, avoid trans fats, minimize simple carbs and saturated fats. Increase exercise as tolerated      Relevant Medications   losartan (COZAAR) 100 MG tablet   atorvastatin (LIPITOR) 10 MG tablet   Other Relevant Orders   TSH (Completed)   CBC (Completed)   Lipid panel (Completed)   Comprehensive metabolic panel (Completed)   Medicare annual wellness visit, subsequent    Patient denies any difficulties at home. No trouble with ADLs, depression or falls. See EMR for functional status screen and depression screen. No recent changes to vision or hearing. Is UTD with immunizations. Is UTD with screening. Discussed Advanced Directives. Encouraged heart healthy diet, exercise as tolerated and adequate sleep. See patient's problem list for health risk factors to monitor. See AVS for preventative healthcare recommendation schedule. Labs reviewed. Tdap and Pneumonia shot Allergies verified:Yes  Immunization Status: Reviewed with patient. Refused all immunizations  Prompted for insurance verification: Yes Flu vaccine-- Refused Tdap--Refused PNA--Refused Shingles--Refused   CCS:Due 2019 [id number] listed,patient states he does not remember having  Care Teams  Updated: [facility]-Oncology,[facility]-Cardiologist ED/Hospital/Urgent Care Visits: None Updated insurance, contact information, forms: Advised  patent to update front office staff with any new information upon arrival Remind to bring: DPR information, advance directives: Yes      Nausea    Encouraged bland diet, add probiotics, refill on Ondansetron to use prn      Tobacco use    Encouraged complete cessation. Discussed need to quit as relates to risk of numerous cancers, cardiac and pulmonary disease as well as neurologic complications. Counseled for greater than 3 minutes       Other Visit Diagnoses    Need for viral immunization        Screening for viral disease        Relevant Orders    Hepatitis C Antibody (Completed)    Decreased vision        Relevant Orders    Ambulatory referral to Ophthalmology    Need for vaccination with 13-polyvalent pneumococcal conjugate vaccine        Relevant Orders    Pneumococcal conjugate vaccine 13-valent (Completed)    Need for diphtheria-tetanus-pertussis (Tdap) vaccine, adult/adolescent        Relevant Orders    Tdap vaccine greater than or equal to 7yo IM (Completed)       I have changed Mr. Krammes's atorvastatin. I am also having him maintain his multivitamin with minerals, Probiotic Product (PROBIOTIC FORMULA PO), acetaminophen, nitroGLYCERIN, metoprolol succinate, omeprazole, amLODipine, XARELTO, ferrous sulfate, imatinib, ondansetron, losartan, and fluticasone.  Meds ordered this encounter  Medications  . losartan (COZAAR) 100 MG tablet    Sig: Take 1 tablet (100 mg total) by mouth daily.    Dispense:  30 tablet    Refill:  6  . atorvastatin (LIPITOR) 10 MG tablet    Sig: Take 1 tablet (10 mg total) by mouth daily.    Dispense:  30 tablet    Refill:  6  . fluticasone (FLONASE) 50 MCG/ACT nasal spray    Sig: Place 2 sprays into both nostrils daily.    Dispense:  16 g    Refill:  6     Penni Homans, MD

## 2015-11-16 NOTE — Assessment & Plan Note (Signed)
Tolerating statin, encouraged heart healthy diet, avoid trans fats, minimize simple carbs and saturated fats. Increase exercise as tolerated 

## 2015-11-16 NOTE — Assessment & Plan Note (Signed)
Avoid offending foods, start probiotics. Do not eat large meals in late evening and consider raising head of bed.  

## 2015-11-16 NOTE — Assessment & Plan Note (Signed)
Encouraged bland diet, add probiotics, refill on Ondansetron to use prn

## 2015-11-16 NOTE — Assessment & Plan Note (Signed)
Encouraged complete cessation. Discussed need to quit as relates to risk of numerous cancers, cardiac and pulmonary disease as well as neurologic complications. Counseled for greater than 3 minutes 

## 2015-11-17 DIAGNOSIS — H5202 Hypermetropia, left eye: Secondary | ICD-10-CM | POA: Diagnosis not present

## 2015-11-17 DIAGNOSIS — H5210 Myopia, unspecified eye: Secondary | ICD-10-CM | POA: Diagnosis not present

## 2015-11-23 ENCOUNTER — Other Ambulatory Visit: Payer: Self-pay | Admitting: Family Medicine

## 2015-11-27 ENCOUNTER — Ambulatory Visit (HOSPITAL_BASED_OUTPATIENT_CLINIC_OR_DEPARTMENT_OTHER): Payer: Commercial Managed Care - HMO | Admitting: Hematology & Oncology

## 2015-11-27 ENCOUNTER — Encounter: Payer: Self-pay | Admitting: Hematology & Oncology

## 2015-11-27 ENCOUNTER — Other Ambulatory Visit (HOSPITAL_BASED_OUTPATIENT_CLINIC_OR_DEPARTMENT_OTHER): Payer: Commercial Managed Care - HMO

## 2015-11-27 VITALS — BP 141/77 | HR 58 | Temp 98.0°F | Resp 16 | Ht 68.0 in | Wt 201.0 lb

## 2015-11-27 DIAGNOSIS — Z7901 Long term (current) use of anticoagulants: Secondary | ICD-10-CM

## 2015-11-27 DIAGNOSIS — I480 Paroxysmal atrial fibrillation: Secondary | ICD-10-CM

## 2015-11-27 DIAGNOSIS — C9211 Chronic myeloid leukemia, BCR/ABL-positive, in remission: Secondary | ICD-10-CM

## 2015-11-27 DIAGNOSIS — C9210 Chronic myeloid leukemia, BCR/ABL-positive, not having achieved remission: Secondary | ICD-10-CM | POA: Diagnosis not present

## 2015-11-27 LAB — COMPREHENSIVE METABOLIC PANEL (CC13)
AST: 13 U/L (ref 5–34)
Albumin: 4 g/dL (ref 3.5–5.0)
Alkaline Phosphatase: 89 U/L (ref 40–150)
Anion Gap: 9 mEq/L (ref 3–11)
BUN: 10.1 mg/dL (ref 7.0–26.0)
CALCIUM: 9.5 mg/dL (ref 8.4–10.4)
CHLORIDE: 110 meq/L — AB (ref 98–109)
CO2: 24 meq/L (ref 22–29)
CREATININE: 1.2 mg/dL (ref 0.7–1.3)
EGFR: 63 mL/min/{1.73_m2} — ABNORMAL LOW (ref >90)
GLUCOSE: 87 mg/dL (ref 70–140)
Potassium: 4 mEq/L (ref 3.5–5.1)
SODIUM: 143 meq/L (ref 136–145)
Total Bilirubin: 0.44 mg/dL (ref 0.20–1.20)
Total Protein: 7.2 g/dL (ref 6.4–8.3)

## 2015-11-27 LAB — LACTATE DEHYDROGENASE (CC13): LDH: 158 U/L (ref 125–245)

## 2015-11-27 NOTE — Progress Notes (Signed)
Hematology and Oncology Follow Up Visit  Jason [name] [id number] December 18, 1946 69 y.o. 11/27/2015   Principle Diagnosis:   Chronic phase CML  Paroxysmal atrial fibrillation  Current Therapy:    [name] 200 mg by mouth daily  Xarelto 20 mg by mouth daily     Interim History:  [name] is back for follow-up. Unfortunately, his oldest son died in a car accident back in October. He had diabetes and everything that he had a hypoglycemic episode. As such, I'm sure that he probably was not even aware of what happened.  Jason [name] is doing about as well as ask be expected with this. He unfortunately has started to smoke a little bit more.  As far as his CML is concerned, he is doing very well with with this. Back in August we saw him, his BCR/ABL ratio was 0.03%.  He's doing well with the Xarelto. He's had no bleeding. He does have the atrial fibrillation.  He's had no change in bowel or bladder habits. Had a colonoscopy in August. Nothing was found. He was told, however, to take iron.  He's had no fever. He's had no cough. He's had no leg swelling. He's had no rashes.   Overall, his performance status is ECOG 1.   Medications:  Current outpatient prescriptions:  .  acetaminophen (TYLENOL) 325 MG tablet, Take 650 mg by mouth every 6 (six) hours as needed. For headache., Disp: , Rfl:  .  amLODipine (NORVASC) 5 MG tablet, Take 1 tablet (5 mg total) by mouth daily., Disp: 30 tablet, Rfl: 12 .  atorvastatin (LIPITOR) 10 MG tablet, Take 1 tablet (10 mg total) by mouth daily., Disp: 30 tablet, Rfl: 6 .  ferrous sulfate 325 (65 FE) MG EC tablet, Take 1 tablet (325 mg total) by mouth 2 (two) times daily., Disp: 60 tablet, Rfl: 11 .  fluticasone (FLONASE) 50 MCG/ACT nasal spray, Place 2 sprays into both nostrils daily., Disp: 16 g, Rfl: 6 .  imatinib ([name]) 100 MG tablet, Take 2 tablets (200 mg total) by mouth daily. Take with meals and large glass of water.Caution:Chemotherapy., Disp: 60  tablet, Rfl: 6 .  losartan (COZAAR) 100 MG tablet, Take 1 tablet (100 mg total) by mouth daily., Disp: 30 tablet, Rfl: 6 .  meclizine (ANTIVERT) 25 MG tablet, TAKE ONE TABLET BY MOUTH THREE TIMES DAILY, Disp: 30 tablet, Rfl: 0 .  metoprolol succinate (TOPROL-XL) 50 MG 24 hr tablet, Take 1 tablet (50 mg total) by mouth daily. NOTE DOSE CHANGE, Disp: 30 tablet, Rfl: 11 .  Multiple Vitamins-Minerals (MULTIVITAMIN WITH MINERALS) tablet, Take 1 tablet by mouth daily.  , Disp: , Rfl:  .  nitroGLYCERIN (NITROSTAT) 0.4 MG SL tablet, Place 1 tablet (0.4 mg total) under the tongue every 5 (five) minutes as needed for chest pain., Disp: 25 tablet, Rfl: 2 .  omeprazole (PRILOSEC) 40 MG capsule, Take 1 capsule (40 mg total) by mouth daily., Disp: 30 capsule, Rfl: 6 .  ondansetron (ZOFRAN) 4 MG tablet, TAKE ONE TABLET BY MOUTH EVERY 8 HOURS AS NEEDED FOR NAUSEA AND VOMITING, Disp: 20 tablet, Rfl: 0 .  Probiotic Product (PROBIOTIC FORMULA PO), Take by mouth daily., Disp: , Rfl:  .  XARELTO 20 MG TABS tablet, TAKE 1 TABLET (20 MG) BY MOUTH DAILY WITH SUPPER., Disp: 30 tablet, Rfl: 3  Allergies: No Known Allergies  Past Medical History, Surgical history, Social history, and Family History were reviewed and updated.  Review of Systems: As above  Physical  Exam:  height is 5\' 8"  (1.727 m) and weight is 201 lb (91.173 kg). His oral temperature is 98 F (36.7 C). His blood pressure is 141/77 and his pulse is 58. His respiration is 16.   Well-developed and well-nourished white woman. Head and neck exam does not her or oral lesions. He has no palpable cervical or supraclavicular lymph nodes. Lungs are clear bilaterally. Cardiac exam regular rate and rhythm with a normal S1 and S2. There are no murmurs rubs or bruits. Abdomen is soft. He has good bowel sounds. There is no palpable abdominal mass. There is no palpable liver or spleen tip. Back exam shows no tenderness over the spine ribs or hips. Extremities shows no  clubbing cyanosis or edema. Skin exam no rashes, ecchymosis or petechia. Neurological exam is nonfocal.  Lab Results  Component Value Date   WBC 10.0 11/07/2015   HGB 12.8* 11/07/2015   HCT 40.2 11/07/2015   MCV 92.5 11/07/2015   PLT 291.0 11/07/2015     Chemistry      Component Value Date/Time   NA 144 11/07/2015 1149   NA 150* 03/31/2015 1119   K 4.3 11/07/2015 1149   K 4.2 03/31/2015 1119   CL 109 11/07/2015 1149   CL 109* 03/31/2015 1119   CO2 29 11/07/2015 1149   CO2 26 03/31/2015 1119   BUN 9 11/07/2015 1149   BUN 10 03/31/2015 1119   CREATININE 1.12 11/07/2015 1149   CREATININE 1.3* 03/31/2015 1119      Component Value Date/Time   CALCIUM 9.2 11/07/2015 1149   CALCIUM 9.1 03/31/2015 1119   ALKPHOS 91 11/07/2015 1149   ALKPHOS 66 03/31/2015 1119   AST 13 11/07/2015 1149   AST 15 03/31/2015 1119   ALT 10 11/07/2015 1149   ALT 13 03/31/2015 1119   BILITOT 0.3 11/07/2015 1149   BILITOT 0.60 03/31/2015 1119      I looked at his blood smear. His white cells appeared mature. There were no blasts. There were no nucleated red cells. Platelets looked normal.   Impression and Plan: Jason [name] is a 69 year old gentleman with chronic phase CML. He is in remission by his last analysis. He is only on 200 mg a day.  We will continue him on the [place].  I do feel bad for him with his son died from a car accident in 2023-10-17. This definitely has been very tough on him.  We will pre-hard for he and his family.  We will get him back in 6 months.     Volanda Napoleon, MD 12/1/201610:35 AM

## 2015-12-05 ENCOUNTER — Telehealth: Payer: Self-pay | Admitting: Hematology & Oncology

## 2015-12-05 NOTE — Telephone Encounter (Signed)
[employer] application faxed todday to: [phone].  Pt is applying for [name] assistance.      P: [phone]

## 2015-12-12 ENCOUNTER — Other Ambulatory Visit: Payer: Self-pay | Admitting: *Deleted

## 2015-12-12 DIAGNOSIS — C9210 Chronic myeloid leukemia, BCR/ABL-positive, not having achieved remission: Secondary | ICD-10-CM

## 2015-12-12 MED ORDER — IMATINIB MESYLATE 100 MG PO TABS: 200.0000 mg | ORAL_TABLET | Freq: Every day | ORAL | Status: DC

## 2015-12-15 ENCOUNTER — Telehealth: Payer: Self-pay | Admitting: Hematology & Oncology

## 2015-12-15 NOTE — Telephone Encounter (Addendum)
[employer] has APPROVED the [name] 100MG   and is good until 12/11/2017.     P: [id number]      COPY SCANNED

## 2015-12-22 ENCOUNTER — Other Ambulatory Visit: Payer: Self-pay | Admitting: Family Medicine

## 2015-12-29 ENCOUNTER — Other Ambulatory Visit: Payer: Self-pay | Admitting: Family Medicine

## 2015-12-30 NOTE — Telephone Encounter (Signed)
Refilled pts rx last seen nov 2016. Next apt is may 2017.

## 2015-12-31 ENCOUNTER — Other Ambulatory Visit: Payer: Self-pay | Admitting: Family Medicine

## 2016-01-01 ENCOUNTER — Other Ambulatory Visit: Payer: Self-pay | Admitting: Cardiology

## 2016-01-01 NOTE — Telephone Encounter (Signed)
°*  STAT* If patient is at the pharmacy, call can be transferred to refill team.   1. Which medications need to be refilled? (please list name of each medication and dose if known) Nitroglycerin  2. Which pharmacy/location (including street and city if local pharmacy) is medication to be sent to?[phone]  3. Do they need a 30 day or 90 day supply? 1 bottle

## 2016-01-02 MED ORDER — NITROGLYCERIN 0.4 MG SL SUBL: 0.4000 mg | SUBLINGUAL_TABLET | SUBLINGUAL | Status: DC | PRN

## 2016-01-02 NOTE — Telephone Encounter (Signed)
Rx(s) sent to pharmacy electronically.  

## 2016-01-14 ENCOUNTER — Other Ambulatory Visit: Payer: Self-pay | Admitting: Family Medicine

## 2016-01-29 ENCOUNTER — Other Ambulatory Visit: Payer: Self-pay | Admitting: Cardiology

## 2016-01-29 MED FILL — XARELTO 20 MG TABLET: 20 | 30 days supply | Qty: 30 | Fill #0

## 2016-02-16 ENCOUNTER — Other Ambulatory Visit: Payer: Self-pay | Admitting: Family Medicine

## 2016-02-26 ENCOUNTER — Other Ambulatory Visit: Payer: Self-pay | Admitting: Family Medicine

## 2016-03-08 MED FILL — XARELTO 20 MG TABLET: 20 | 30 days supply | Qty: 30 | Fill #1

## 2016-03-15 ENCOUNTER — Other Ambulatory Visit: Payer: Self-pay | Admitting: Family Medicine

## 2016-03-15 ENCOUNTER — Other Ambulatory Visit: Payer: Self-pay | Admitting: Pharmacist Clinician (PhC)/ Clinical Pharmacy Specialist

## 2016-03-15 MED ORDER — OMEPRAZOLE 40 MG PO CPDR: 40.0000 mg | DELAYED_RELEASE_CAPSULE | Freq: Every day | ORAL | Status: DC

## 2016-03-15 MED ORDER — RIVAROXABAN 20 MG PO TABS: ORAL_TABLET | ORAL | Status: DC

## 2016-03-15 MED ORDER — ATORVASTATIN CALCIUM 10 MG PO TABS: 10.0000 mg | ORAL_TABLET | Freq: Every day | ORAL | Status: DC

## 2016-03-15 MED ORDER — FLUTICASONE PROPIONATE 50 MCG/ACT NA SUSP: 2.0000 | Freq: Every day | NASAL | Status: DC

## 2016-03-15 MED ORDER — MECLIZINE HCL 25 MG PO TABS: 25.0000 mg | ORAL_TABLET | Freq: Three times a day (TID) | ORAL | Status: DC

## 2016-03-15 MED ORDER — LOSARTAN POTASSIUM 100 MG PO TABS: 100.0000 mg | ORAL_TABLET | Freq: Every day | ORAL | Status: DC

## 2016-03-16 ENCOUNTER — Other Ambulatory Visit: Payer: Self-pay | Admitting: Cardiology

## 2016-03-16 MED ORDER — METOPROLOL SUCCINATE ER 50 MG PO TB24: 50.0000 mg | ORAL_TABLET | Freq: Every day | ORAL | Status: DC

## 2016-03-16 MED ORDER — NITROGLYCERIN 0.4 MG SL SUBL: 0.4000 mg | SUBLINGUAL_TABLET | SUBLINGUAL | Status: DC | PRN

## 2016-03-16 MED ORDER — AMLODIPINE BESYLATE 5 MG PO TABS: 5.0000 mg | ORAL_TABLET | Freq: Every day | ORAL | Status: DC

## 2016-03-17 ENCOUNTER — Other Ambulatory Visit: Payer: Self-pay | Admitting: *Deleted

## 2016-03-17 MED ORDER — AMLODIPINE BESYLATE 5 MG PO TABS: 5.0000 mg | ORAL_TABLET | Freq: Every day | ORAL | Status: DC

## 2016-03-17 MED ORDER — NITROGLYCERIN 0.4 MG SL SUBL: 0.4000 mg | SUBLINGUAL_TABLET | SUBLINGUAL | Status: DC | PRN

## 2016-03-17 MED ORDER — METOPROLOL SUCCINATE ER 50 MG PO TB24: 50.0000 mg | ORAL_TABLET | Freq: Every day | ORAL | Status: DC

## 2016-03-18 ENCOUNTER — Other Ambulatory Visit: Payer: Self-pay | Admitting: Pharmacist Clinician (PhC)/ Clinical Pharmacy Specialist

## 2016-03-18 MED ORDER — RIVAROXABAN 20 MG PO TABS: ORAL_TABLET | ORAL | Status: DC

## 2016-03-21 ENCOUNTER — Other Ambulatory Visit: Payer: Self-pay | Admitting: Family Medicine

## 2016-03-30 ENCOUNTER — Other Ambulatory Visit: Payer: Self-pay | Admitting: Pharmacist Clinician (PhC)/ Clinical Pharmacy Specialist

## 2016-03-30 MED ORDER — RIVAROXABAN 20 MG PO TABS: ORAL_TABLET | ORAL | Status: DC

## 2016-04-14 ENCOUNTER — Encounter (HOSPITAL_BASED_OUTPATIENT_CLINIC_OR_DEPARTMENT_OTHER): Payer: Self-pay | Admitting: *Deleted

## 2016-04-14 ENCOUNTER — Emergency Department (HOSPITAL_BASED_OUTPATIENT_CLINIC_OR_DEPARTMENT_OTHER): Payer: Commercial Managed Care - HMO

## 2016-04-14 ENCOUNTER — Inpatient Hospital Stay (HOSPITAL_BASED_OUTPATIENT_CLINIC_OR_DEPARTMENT_OTHER)
Admission: EM | Admit: 2016-04-14 | Discharge: 2016-04-16 | DRG: 243 | Disposition: A | Discharge status: Home / Self Care | Payer: Commercial Managed Care - HMO | Attending: Internal Medicine | Admitting: Internal Medicine

## 2016-04-14 DIAGNOSIS — I4519 Other right bundle-branch block: Secondary | ICD-10-CM | POA: Diagnosis present

## 2016-04-14 DIAGNOSIS — R079 Chest pain, unspecified: Secondary | ICD-10-CM | POA: Diagnosis present

## 2016-04-14 DIAGNOSIS — R0789 Other chest pain: Secondary | ICD-10-CM | POA: Diagnosis present

## 2016-04-14 DIAGNOSIS — I209 Angina pectoris, unspecified: Secondary | ICD-10-CM | POA: Diagnosis not present

## 2016-04-14 DIAGNOSIS — Z806 Family history of leukemia: Secondary | ICD-10-CM | POA: Diagnosis not present

## 2016-04-14 DIAGNOSIS — I480 Paroxysmal atrial fibrillation: Secondary | ICD-10-CM | POA: Diagnosis present

## 2016-04-14 DIAGNOSIS — E785 Hyperlipidemia, unspecified: Secondary | ICD-10-CM | POA: Diagnosis present

## 2016-04-14 DIAGNOSIS — Z7901 Long term (current) use of anticoagulants: Secondary | ICD-10-CM

## 2016-04-14 DIAGNOSIS — I10 Essential (primary) hypertension: Secondary | ICD-10-CM | POA: Diagnosis present

## 2016-04-14 DIAGNOSIS — F17210 Nicotine dependence, cigarettes, uncomplicated: Secondary | ICD-10-CM | POA: Diagnosis present

## 2016-04-14 DIAGNOSIS — R42 Dizziness and giddiness: Secondary | ICD-10-CM

## 2016-04-14 DIAGNOSIS — I2510 Atherosclerotic heart disease of native coronary artery without angina pectoris: Secondary | ICD-10-CM | POA: Diagnosis present

## 2016-04-14 DIAGNOSIS — R001 Bradycardia, unspecified: Secondary | ICD-10-CM | POA: Diagnosis not present

## 2016-04-14 DIAGNOSIS — I495 Sick sinus syndrome: Principal | ICD-10-CM | POA: Diagnosis present

## 2016-04-14 DIAGNOSIS — Z8249 Family history of ischemic heart disease and other diseases of the circulatory system: Secondary | ICD-10-CM | POA: Diagnosis not present

## 2016-04-14 DIAGNOSIS — C9210 Chronic myeloid leukemia, BCR/ABL-positive, not having achieved remission: Secondary | ICD-10-CM | POA: Diagnosis present

## 2016-04-14 DIAGNOSIS — Z87891 Personal history of nicotine dependence: Secondary | ICD-10-CM | POA: Diagnosis present

## 2016-04-14 DIAGNOSIS — Z955 Presence of coronary angioplasty implant and graft: Secondary | ICD-10-CM | POA: Diagnosis not present

## 2016-04-14 DIAGNOSIS — I499 Cardiac arrhythmia, unspecified: Secondary | ICD-10-CM | POA: Diagnosis not present

## 2016-04-14 DIAGNOSIS — Z8673 Personal history of transient ischemic attack (TIA), and cerebral infarction without residual deficits: Secondary | ICD-10-CM

## 2016-04-14 DIAGNOSIS — Z950 Presence of cardiac pacemaker: Secondary | ICD-10-CM

## 2016-04-14 DIAGNOSIS — R209 Unspecified disturbances of skin sensation: Secondary | ICD-10-CM | POA: Diagnosis not present

## 2016-04-14 HISTORY — DX: Cerebral infarction, unspecified: I63.9

## 2016-04-14 HISTORY — DX: Paroxysmal atrial fibrillation: I48.0

## 2016-04-14 HISTORY — DX: Bradycardia, unspecified: R00.1

## 2016-04-14 LAB — CBC WITH DIFFERENTIAL/PLATELET
BASOS ABS: 0.1 10*3/uL (ref 0.0–0.1)
BASOS PCT: 1 %
Eosinophils Absolute: 0.3 10*3/uL (ref 0.0–0.7)
Eosinophils Relative: 5 %
HEMATOCRIT: 40.7 % (ref 39.0–52.0)
HEMOGLOBIN: 13.7 g/dL (ref 13.0–17.0)
LYMPHS PCT: 26 %
Lymphs Abs: 1.8 10*3/uL (ref 0.7–4.0)
MCH: 31.4 pg (ref 26.0–34.0)
MCHC: 33.7 g/dL (ref 30.0–36.0)
MCV: 93.1 fL (ref 78.0–100.0)
Monocytes Absolute: 0.6 10*3/uL (ref 0.1–1.0)
Monocytes Relative: 9 %
NEUTROS ABS: 4.3 10*3/uL (ref 1.7–7.7)
NEUTROS PCT: 59 %
Platelets: 260 10*3/uL (ref 150–400)
RBC: 4.37 MIL/uL (ref 4.22–5.81)
RDW: 14.3 % (ref 11.5–15.5)
WBC: 7.2 10*3/uL (ref 4.0–10.5)

## 2016-04-14 LAB — COMPREHENSIVE METABOLIC PANEL
ALBUMIN: 4.2 g/dL (ref 3.5–5.0)
ALT: 17 U/L (ref 17–63)
ANION GAP: 7 (ref 5–15)
AST: 22 U/L (ref 15–41)
Alkaline Phosphatase: 80 U/L (ref 38–126)
BILIRUBIN TOTAL: 0.7 mg/dL (ref 0.3–1.2)
BUN: 10 mg/dL (ref 6–20)
CHLORIDE: 112 mmol/L — AB (ref 101–111)
CO2: 23 mmol/L (ref 22–32)
Calcium: 9.2 mg/dL (ref 8.9–10.3)
Creatinine, Ser: 1.14 mg/dL (ref 0.61–1.24)
GFR calc Af Amer: >60 mL/min (ref >60)
GLUCOSE: 100 mg/dL — AB (ref 65–99)
POTASSIUM: 3.6 mmol/L (ref 3.5–5.1)
Sodium: 142 mmol/L (ref 135–145)
TOTAL PROTEIN: 7.2 g/dL (ref 6.5–8.1)

## 2016-04-14 LAB — URINALYSIS, ROUTINE W REFLEX MICROSCOPIC
Bilirubin Urine: NEGATIVE
Glucose, UA: NEGATIVE mg/dL
Hgb urine dipstick: NEGATIVE
Ketones, ur: NEGATIVE mg/dL
LEUKOCYTES UA: NEGATIVE
NITRITE: NEGATIVE
PH: 5.5 (ref 5.0–8.0)
Protein, ur: NEGATIVE mg/dL
SPECIFIC GRAVITY, URINE: 1.008 (ref 1.005–1.030)

## 2016-04-14 LAB — TROPONIN I: Troponin I: <0.03 ng/mL (ref <0.031)

## 2016-04-14 LAB — D-DIMER, QUANTITATIVE: D-Dimer, Quant: 0.44 ug/mL-FEU (ref 0.00–0.50)

## 2016-04-14 MED ORDER — METOPROLOL SUCCINATE ER 25 MG PO TB24
25.0000 mg | ORAL_TABLET | Freq: Every day | ORAL | Status: DC
  Administered 2016-04-15 – 2016-04-16 (×2): 25 mg via ORAL
  Filled 2016-04-14 (×2): qty 1

## 2016-04-14 MED ORDER — ENOXAPARIN SODIUM 40 MG/0.4ML ~~LOC~~ SOLN: 40.0000 mg | SUBCUTANEOUS | Status: DC

## 2016-04-14 MED ORDER — AMLODIPINE BESYLATE 5 MG PO TABS
5.0000 mg | ORAL_TABLET | Freq: Every day | ORAL | Status: DC
  Administered 2016-04-14 – 2016-04-16 (×3): 5 mg via ORAL
  Filled 2016-04-14 (×3): qty 1

## 2016-04-14 MED ORDER — FERROUS SULFATE 325 (65 FE) MG PO TABS
325.0000 mg | ORAL_TABLET | Freq: Two times a day (BID) | ORAL | Status: DC
  Administered 2016-04-14 – 2016-04-16 (×3): 325 mg via ORAL
  Filled 2016-04-14 (×3): qty 1

## 2016-04-14 MED ORDER — NITROGLYCERIN 0.4 MG SL SUBL
0.4000 mg | SUBLINGUAL_TABLET | SUBLINGUAL | Status: DC | PRN
  Administered 2016-04-14: 0.4 mg via SUBLINGUAL
  Filled 2016-04-14: qty 1

## 2016-04-14 MED ORDER — ATORVASTATIN CALCIUM 10 MG PO TABS
10.0000 mg | ORAL_TABLET | Freq: Every day | ORAL | Status: DC
  Administered 2016-04-14 – 2016-04-16 (×2): 10 mg via ORAL
  Filled 2016-04-14 (×2): qty 1

## 2016-04-14 MED ORDER — PANTOPRAZOLE SODIUM 40 MG PO TBEC
40.0000 mg | DELAYED_RELEASE_TABLET | Freq: Every day | ORAL | Status: DC
  Administered 2016-04-14 – 2016-04-16 (×3): 40 mg via ORAL
  Filled 2016-04-14 (×4): qty 1

## 2016-04-14 MED ORDER — ONDANSETRON HCL 4 MG/2ML IJ SOLN
4.0000 mg | Freq: Once | INTRAMUSCULAR | Status: AC
  Administered 2016-04-14: 4 mg via INTRAVENOUS
  Filled 2016-04-14: qty 2

## 2016-04-14 MED ORDER — MECLIZINE HCL 25 MG PO TABS: 25.0000 mg | ORAL_TABLET | Freq: Three times a day (TID) | ORAL | Status: DC | PRN

## 2016-04-14 MED ORDER — ACETAMINOPHEN 325 MG PO TABS: 650.0000 mg | ORAL_TABLET | Freq: Four times a day (QID) | ORAL | Status: DC | PRN

## 2016-04-14 MED ORDER — IMATINIB MESYLATE 100 MG PO TABS
200.0000 mg | ORAL_TABLET | Freq: Every day | ORAL | Status: DC
  Filled 2016-04-14 (×2): qty 2

## 2016-04-14 MED ORDER — FLUTICASONE PROPIONATE 50 MCG/ACT NA SUSP
2.0000 | Freq: Every day | NASAL | Status: DC
  Administered 2016-04-14 – 2016-04-16 (×3): 2 via NASAL
  Filled 2016-04-14: qty 16

## 2016-04-14 MED ORDER — ASPIRIN 81 MG PO CHEW
324.0000 mg | CHEWABLE_TABLET | Freq: Once | ORAL | Status: AC
  Administered 2016-04-14: 324 mg via ORAL
  Filled 2016-04-14: qty 4

## 2016-04-14 MED ORDER — ACETAMINOPHEN 325 MG PO TABS: 650.0000 mg | ORAL_TABLET | ORAL | Status: DC | PRN

## 2016-04-14 MED ORDER — LOSARTAN POTASSIUM 50 MG PO TABS
100.0000 mg | ORAL_TABLET | Freq: Every day | ORAL | Status: DC
  Administered 2016-04-15 – 2016-04-16 (×2): 100 mg via ORAL
  Filled 2016-04-14 (×2): qty 2

## 2016-04-14 MED ORDER — MECLIZINE HCL 25 MG PO TABS
25.0000 mg | ORAL_TABLET | Freq: Once | ORAL | Status: AC
  Administered 2016-04-14: 25 mg via ORAL
  Filled 2016-04-14: qty 1

## 2016-04-14 MED ORDER — NICOTINE 14 MG/24HR TD PT24
14.0000 mg | MEDICATED_PATCH | Freq: Every day | TRANSDERMAL | Status: DC
  Administered 2016-04-14 – 2016-04-16 (×3): 14 mg via TRANSDERMAL
  Filled 2016-04-14 (×3): qty 1

## 2016-04-14 MED ORDER — ONDANSETRON HCL 4 MG/2ML IJ SOLN
4.0000 mg | Freq: Four times a day (QID) | INTRAMUSCULAR | Status: DC | PRN
  Administered 2016-04-15 – 2016-04-16 (×2): 4 mg via INTRAVENOUS
  Filled 2016-04-14 (×2): qty 2

## 2016-04-14 MED ORDER — RIVAROXABAN 20 MG PO TABS
20.0000 mg | ORAL_TABLET | Freq: Every day | ORAL | Status: DC
  Administered 2016-04-14: 20 mg via ORAL
  Filled 2016-04-14: qty 1

## 2016-04-14 MED ORDER — SODIUM CHLORIDE 0.9 % IV BOLUS (SEPSIS)
500.0000 mL | Freq: Once | INTRAVENOUS | Status: AC
  Administered 2016-04-14: 500 mL via INTRAVENOUS

## 2016-04-14 NOTE — ED Notes (Signed)
Patient transported to X-ray 

## 2016-04-14 NOTE — ED Notes (Signed)
Patient asked to change into a gown.  

## 2016-04-14 NOTE — ED Notes (Signed)
Pt now states he also has "pressure" in his chest and "it takes my breath away". Pt denies any pressure in his chest at this time, "it comes and goes."

## 2016-04-14 NOTE — H&P (Signed)
History and Physical    Jason [name] [id number] DOB: 1946-03-02 DOA: 04/14/2016  Referring MD/NP/PA: Arlean Hopping ED PCP: Penni Homans, MD  Outpatient Specialists: cardiology, [name], Onc Dr. Marin Olp Patient coming from: home   Chief Complaint: chest pain  HPI: Jason [name] is a 70 y.o. male with medical history significant of CAD, HLD, PAF, HTN, HLD, tobacco abuse, presents to the ED with chief complain of chest pain. Patient states that over the last 3-4 days, he has been experiencing intermittent chest "pressures", coming and going.He is a poor historian and cannot really tell me how long they last, but it doesn't appear that the last long. These are associated with shortness of breath, and feeling like he can't catch his breath. He also endorses nausea with these episodes. He denies any fever or chills, he denies any lightheadedness. He endorses palpitations as feeling his heart "fluttering" at times. He endorses dizziness, this is chronic for him, and has not changed as it was in the last year. He endorses a cough and chest congestion, which she attributes to allergies and is not unusual for him. Is chest pain is located in the middle of his chest,without radiation. He thinks it's getting worse every time he tries to smoke a cigarette, but there is no association with exertion.  ED Course: Vitals are stable, blood work relatively unremarkable, initial troponin was negative, negative d-dimer. TRH was asked for admission for chest pain rule out.  Review of Systems: As per HPI otherwise 10 point review of systems negative.   Past Medical History  Diagnosis Date  . CML (chronic myelocytic leukemia) ([place]) 12/16/2011  . History of chicken pox   . Hypertension   . Hyperlipidemia   . History of kidney stones   . Coronary heart disease   . Atrial fibrillation ([place])   . IBS (irritable bowel syndrome) 09/20/2013  . Grief reaction 03/18/2014  . Medicare annual wellness visit, subsequent  03/23/2014  . Anemia 05/08/2015  . GERD (gastroesophageal reflux disease)     Past Surgical History  Procedure Laterality Date  . Cholecystectomy  2003  . Coronary angioplasty with stent placement  2000  . Cataract extraction       reports that he has been smoking Cigarettes.  He started smoking about 48 years ago. He has been smoking about 0.25 packs per day. He has never used smokeless tobacco. He reports that he does not drink alcohol or use illicit drugs.  No Known Allergies  Family History  Problem Relation Age of Onset  . Arthritis Mother   . Hypertension Mother   . Heart disease Mother     Rheumatic fever  . Rheumatic fever Mother   . Other Mother     brain tumor  . Cancer Mother     leukemia  . Hypertension Father   . Alcohol abuse Father   . Colon cancer Neg Hx   . Breast cancer Neg Hx   . Prostate cancer Neg Hx   . Diabetes Son     Prior to Admission medications   Medication Sig Start Date End Date Taking? Authorizing Provider  acetaminophen (TYLENOL) 325 MG tablet Take 650 mg by mouth every 6 (six) hours as needed. For headache.    Historical Provider, MD  amLODipine (NORVASC) 5 MG tablet Take 1 tablet (5 mg total) by mouth daily. Please schedule appointment for refills. 03/17/16   Lelon Perla, MD  atorvastatin (LIPITOR) 10 MG tablet Take 1 tablet (10 mg  total) by mouth daily. 03/15/16   Mosie Lukes, MD  ferrous sulfate 325 (65 FE) MG EC tablet Take 1 tablet (325 mg total) by mouth 2 (two) times daily. 09/02/15   Irene Shipper, MD  fluticasone (FLONASE) 50 MCG/ACT nasal spray Place 2 sprays into both nostrils daily. 03/15/16   Mosie Lukes, MD  imatinib ([name]) 100 MG tablet Take 2 tablets (200 mg total) by mouth daily. Take with meals and large glass of water.Caution:Chemotherapy. 12/12/15   Volanda Napoleon, MD  losartan (COZAAR) 100 MG tablet Take 1 tablet (100 mg total) by mouth daily. 03/15/16   Mosie Lukes, MD  meclizine (ANTIVERT) 25 MG tablet Take 1  tablet (25 mg total) by mouth 3 (three) times daily. 03/15/16   Mosie Lukes, MD  metoprolol succinate (TOPROL-XL) 50 MG 24 hr tablet Take 1 tablet (50 mg total) by mouth daily. Please schedule appointment for refills. 03/17/16   Lelon Perla, MD  Multiple Vitamins-Minerals (MULTIVITAMIN WITH MINERALS) tablet Take 1 tablet by mouth daily.      Historical Provider, MD  nitroGLYCERIN (NITROSTAT) 0.4 MG SL tablet Place 1 tablet (0.4 mg total) under the tongue every 5 (five) minutes as needed for chest pain. Please schedule appointment for refills. 03/17/16   Lelon Perla, MD  omeprazole (PRILOSEC) 40 MG capsule Take 1 capsule (40 mg total) by mouth daily. 03/15/16   Mosie Lukes, MD  ondansetron (ZOFRAN) 4 MG tablet TAKE ONE TABLET BY MOUTH EVERY 8 HOURS AS NEEDED FOR NAUSEA AND FOR VOMITING 03/21/16   Mosie Lukes, MD  Probiotic Product (PROBIOTIC FORMULA PO) Take by mouth daily.    Historical Provider, MD  rivaroxaban (XARELTO) 20 MG TABS tablet TAKE 1 TABLET (20 MG) BY MOUTH DAILY WITH SUPPER. 03/30/16   Lelon Perla, MD    Physical Exam: Filed Vitals:   04/14/16 1130 04/14/16 1200 04/14/16 1350 04/14/16 1526  BP: 179/70 141/80 160/75 159/66  Pulse: 50 56 54 56  Temp:   98 F (36.7 C) 97.8 F (36.6 C)  TempSrc:   Oral Oral  Resp: 12 16 20 18   Height:      Weight:      SpO2: 100% 100% 100% 99%      Constitutional: NAD, calm, comfortable Filed Vitals:   04/14/16 1130 04/14/16 1200 04/14/16 1350 04/14/16 1526  BP: 179/70 141/80 160/75 159/66  Pulse: 50 56 54 56  Temp:   98 F (36.7 C) 97.8 F (36.6 C)  TempSrc:   Oral Oral  Resp: 12 16 20 18   Height:      Weight:      SpO2: 100% 100% 100% 99%   Eyes: PERRL, lids and conjunctivae normal ENMT: Mucous membranes are moist. Posterior pharynx clear of any exudate or lesions. Poor dentition.  Respiratory: clear to auscultation bilaterally, no wheezing, no crackles. Normal respiratory effort. No accessory muscle use.    Cardiovascular: Regular rate and rhythm, no murmurs / rubs / gallops. No extremity edema. 2+ pedal pulses. No carotid bruits.  Abdomen: no tenderness. Bowel sounds positive.  Musculoskeletal: no clubbing / cyanosis.  Skin: no rashes, lesions, ulcers. No induration Neurologic: non focal   Psychiatric: Normal judgment and insight. Alert and oriented x 3. Normal mood.   Labs on Admission: I have personally reviewed following labs and imaging studies  CBC:  Recent Labs Lab 04/14/16 0920  WBC 7.2  NEUTROABS 4.3  HGB 13.7  HCT 40.7  MCV 93.1  PLT 123456   Basic Metabolic Panel:  Recent Labs Lab 04/14/16 0920  NA 142  K 3.6  CL 112*  CO2 23  GLUCOSE 100*  BUN 10  CREATININE 1.14  CALCIUM 9.2   GFR: Estimated Creatinine Clearance: 67.8 mL/min (by C-G formula based on Cr of 1.14). Liver Function Tests:  Recent Labs Lab 04/14/16 0920  AST 22  ALT 17  ALKPHOS 80  BILITOT 0.7  PROT 7.2  ALBUMIN 4.2   No results for input(s): LIPASE, AMYLASE in the last 168 hours. No results for input(s): AMMONIA in the last 168 hours. Coagulation Profile: No results for input(s): INR, PROTIME in the last 168 hours. Cardiac Enzymes:  Recent Labs Lab 04/14/16 0920  TROPONINI <0.03   BNP (last 3 results) No results for input(s): PROBNP in the last 8760 hours. HbA1C: No results for input(s): HGBA1C in the last 72 hours. CBG: No results for input(s): GLUCAP in the last 168 hours. Lipid Profile: No results for input(s): CHOL, HDL, LDLCALC, TRIG, CHOLHDL, LDLDIRECT in the last 72 hours. Thyroid Function Tests: No results for input(s): TSH, T4TOTAL, FREET4, T3FREE, THYROIDAB in the last 72 hours. Anemia Panel: No results for input(s): VITAMINB12, FOLATE, FERRITIN, TIBC, IRON, RETICCTPCT in the last 72 hours. Urine analysis:    Component Value Date/Time   COLORURINE YELLOW 04/14/2016 1025   APPEARANCEUR CLEAR 04/14/2016 1025   LABSPEC 1.008 04/14/2016 1025   PHURINE 5.5  04/14/2016 1025   GLUCOSEU NEGATIVE 04/14/2016 1025   HGBUR NEGATIVE 04/14/2016 1025   BILIRUBINUR NEGATIVE 04/14/2016 1025   KETONESUR NEGATIVE 04/14/2016 1025   PROTEINUR NEGATIVE 04/14/2016 1025   UROBILINOGEN 0.2 06/27/2014 1315   NITRITE NEGATIVE 04/14/2016 1025   LEUKOCYTESUR NEGATIVE 04/14/2016 1025   Sepsis Labs: @LABRCNTIP (procalcitonin:4,lacticidven:4) )No results found for this or any previous visit (from the past 240 hour(s)).   Radiological Exams on Admission: Dg Chest 2 View  04/14/2016  CLINICAL DATA:  Dizziness and chest pressure EXAM: CHEST  2 VIEW COMPARISON:  04/04/2015 FINDINGS: The heart size and mediastinal contours are within normal limits. Both lungs are clear. The visualized skeletal structures are unremarkable. IMPRESSION: No active cardiopulmonary disease. Electronically Signed   By: Inez Catalina M.D.   On: 04/14/2016 09:55   Ct Head Wo Contrast  04/14/2016  CLINICAL DATA:  Numbness and tingling in both hands EXAM: CT HEAD WITHOUT CONTRAST TECHNIQUE: Contiguous axial images were obtained from the base of the skull through the vertex without intravenous contrast. COMPARISON:  07/04/2012 FINDINGS: Bony calvarium is intact. Very mild atrophic changes are noted. No findings to suggest acute hemorrhage, acute infarction or space-occupying mass lesion are noted. IMPRESSION: No acute intracranial abnormality noted. Electronically Signed   By: Inez Catalina M.D.   On: 04/14/2016 09:50    EKG: Independently reviewed. Sinus bradycardia  Assessment/Plan Active Problems:   CML (chronic myelocytic leukemia) ([facility])   CAD- prior stents 2000 by Dr Elonda Husky in [place]   Tobacco use   HTN (hypertension)   Chest pain with moderate risk of acute coronary syndrome   PAF (paroxysmal atrial fibrillation) ([facility])   Chronic anticoagulation   History of CVA (cerebrovascular accident)   Dizziness   Chest pressure    Chest pain with known coronary artery disease - We'll admit under  observation to telemetry for chest pain rule out - cardiology consult in the morning - cycle cardiac enzymes overnight, repeat  EKG in the morning  Atrial fibrillation - patient's CHA2DS2-VASc Score for Stroke Risk is 5 -  continue home anticoagulation with Xarelto  Hypertension - resume his home medications  CML - Followed by oncology as an outpatient, stable  Dizziness - This is chronic, has not changed in quality  Sinus bradycardia - Heart rate is low with 45-55, decrease metoprolol  Tobacco use - Strongly counseled for cessation - Nicotine patch    DVT prophylaxis: Xarelto  Code Status: Full  Family Communication: son bedside Disposition Plan: home when ready Consults called: Cardiology via Epic  Admission status: Obs   Time spent: 70 minutes, more than 50% bedside, extensively discussing with the patient  Jason Board, MD [facility] Pager [phone]  If 7PM-7AM, please contact night-coverage [email or web address] Password [email or web address]  04/14/2016, 4:31 PM

## 2016-04-14 NOTE — ED Notes (Signed)
Pt reports intermittent numbness and tingling in both hands, pt states he feels lightheaded at times also, none at this time. Pt states "I feel like if I could throw up I'd feel better" speech is clear, moe x4 ext, smile symmetrical.

## 2016-04-14 NOTE — ED Notes (Signed)
MD at bedside. 

## 2016-04-14 NOTE — Progress Notes (Signed)
70 year old male with history of atrial fibrillation on Xarelto, CML followed by Dr. Marin Olp, presents with complaints of intermittent chest pain, EKG at baseline, negative troponin, accepted to telemetry for chest pain rule out, currently chest pain-free , as well complains of lightheadedness versus vertigo, not orthostatic, CT head with no acute findings, very likely BPPV. Phillips Climes MD

## 2016-04-14 NOTE — ED Notes (Signed)
MD at bedside to discuss results.

## 2016-04-14 NOTE — ED Provider Notes (Addendum)
CSN: [id number]     Arrival date & time 04/14/16  0849 History   First MD Initiated Contact with Patient 04/14/16 0919     Chief Complaint  Patient presents with  . Dizziness     (Consider location/radiation/quality/duration/timing/severity/associated sxs/prior Treatment) HPI  70 year old male presents with a chief complaint of lightheadedness for the past 3 days. Patient states it feels like he's going to pass out but also at times feels like the room is spinning. This feels somewhat different from when he was diagnosed with dizziness one year ago this seemed to get better with Antivert. Patient states that the lightheadedness is positional, especially with standing up quickly or turning his head or bending. Has not fallen or passed out. He does also have intermittent chest pressure. Seems to come and go but is not always related to the lightheadedness. He has been having nausea without vomiting for the past 3 days as well. Intermittently has been getting a mild headache that was gradual in onset and has not currently present. Has been having tingling in his bilateral hands for some time, not worse with these symptoms. No other new weakness or numbness. Is having shortness of breath with the dizziness and chest pressure.  Past Medical History  Diagnosis Date  . CML (chronic myelocytic leukemia) ([place]) 12/16/2011  . History of chicken pox   . Hypertension   . Hyperlipidemia   . History of kidney stones   . Coronary heart disease   . Atrial fibrillation ([place])   . IBS (irritable bowel syndrome) 09/20/2013  . Grief reaction 03/18/2014  . Medicare annual wellness visit, subsequent 03/23/2014  . Anemia 05/08/2015  . GERD (gastroesophageal reflux disease)    Past Surgical History  Procedure Laterality Date  . Cholecystectomy  2003  . Coronary angioplasty with stent placement  2000  . Cataract extraction     Family History  Problem Relation Age of Onset  . Arthritis Mother   . Hypertension  Mother   . Heart disease Mother     Rheumatic fever  . Rheumatic fever Mother   . Other Mother     brain tumor  . Cancer Mother     leukemia  . Hypertension Father   . Alcohol abuse Father   . Colon cancer Neg Hx   . Breast cancer Neg Hx   . Prostate cancer Neg Hx   . Diabetes Son    Social History  Substance Use Topics  . Smoking status: Current Every Day Smoker -- 0.25 packs/day    Types: Cigarettes    Start date: 10/30/1967  . Smokeless tobacco: Never Used     Comment: 04-16 still smoking  . Alcohol Use: No     Comment: Rare    Review of Systems    Allergies  Review of patient's allergies indicates no known allergies.  Home Medications   Prior to Admission medications   Medication Sig Start Date End Date Taking? Authorizing Provider  acetaminophen (TYLENOL) 325 MG tablet Take 650 mg by mouth every 6 (six) hours as needed. For headache.    Historical Provider, MD  amLODipine (NORVASC) 5 MG tablet Take 1 tablet (5 mg total) by mouth daily. Please schedule appointment for refills. 03/17/16   Lelon Perla, MD  atorvastatin (LIPITOR) 10 MG tablet Take 1 tablet (10 mg total) by mouth daily. 03/15/16   Mosie Lukes, MD  ferrous sulfate 325 (65 FE) MG EC tablet Take 1 tablet (325 mg total) by mouth 2 (two)  times daily. 09/02/15   Irene Shipper, MD  fluticasone (FLONASE) 50 MCG/ACT nasal spray Place 2 sprays into both nostrils daily. 03/15/16   Mosie Lukes, MD  imatinib ([name]) 100 MG tablet Take 2 tablets (200 mg total) by mouth daily. Take with meals and large glass of water.Caution:Chemotherapy. 12/12/15   Volanda Napoleon, MD  losartan (COZAAR) 100 MG tablet Take 1 tablet (100 mg total) by mouth daily. 03/15/16   Mosie Lukes, MD  meclizine (ANTIVERT) 25 MG tablet Take 1 tablet (25 mg total) by mouth 3 (three) times daily. 03/15/16   Mosie Lukes, MD  metoprolol succinate (TOPROL-XL) 50 MG 24 hr tablet Take 1 tablet (50 mg total) by mouth daily. Please schedule  appointment for refills. 03/17/16   Lelon Perla, MD  Multiple Vitamins-Minerals (MULTIVITAMIN WITH MINERALS) tablet Take 1 tablet by mouth daily.      Historical Provider, MD  nitroGLYCERIN (NITROSTAT) 0.4 MG SL tablet Place 1 tablet (0.4 mg total) under the tongue every 5 (five) minutes as needed for chest pain. Please schedule appointment for refills. 03/17/16   Lelon Perla, MD  omeprazole (PRILOSEC) 40 MG capsule Take 1 capsule (40 mg total) by mouth daily. 03/15/16   Mosie Lukes, MD  ondansetron (ZOFRAN) 4 MG tablet TAKE ONE TABLET BY MOUTH EVERY 8 HOURS AS NEEDED FOR NAUSEA AND FOR VOMITING 03/21/16   Mosie Lukes, MD  Probiotic Product (PROBIOTIC FORMULA PO) Take by mouth daily.    Historical Provider, MD  rivaroxaban (XARELTO) 20 MG TABS tablet TAKE 1 TABLET (20 MG) BY MOUTH DAILY WITH SUPPER. 03/30/16   Lelon Perla, MD   BP 148/82 mmHg  Pulse 58  Temp(Src) 97.9 F (36.6 C) (Oral)  Resp 16  Ht 5\' 8"  (1.727 m)  Wt 200 lb (90.719 kg)  BMI 30.42 kg/m2  SpO2 100% Physical Exam  Constitutional: He is oriented to person, place, and time. He appears well-developed and well-nourished.  HENT:  Head: Normocephalic and atraumatic.  Right Ear: External ear normal.  Left Ear: External ear normal.  Nose: Nose normal.  Eyes: Right eye exhibits no discharge. Left eye exhibits no discharge.  Neck: Neck supple.  Cardiovascular: Regular rhythm, normal heart sounds and intact distal pulses.  Bradycardia present.   Pulmonary/Chest: Effort normal and breath sounds normal.  Abdominal: Soft. There is no tenderness.  Musculoskeletal: He exhibits no edema.  Neurological: He is alert and oriented to person, place, and time.  Skin: Skin is warm and dry.  Nursing note and vitals reviewed.   ED Course  Procedures (including critical care time) Labs Review Labs Reviewed  COMPREHENSIVE METABOLIC PANEL - Abnormal; Notable for the following:    Chloride 112 (*)    Glucose, Bld 100 (*)     All other components within normal limits  TROPONIN I  CBC WITH DIFFERENTIAL/PLATELET  D-DIMER, QUANTITATIVE (NOT AT [facility])  URINALYSIS, ROUTINE W REFLEX MICROSCOPIC (NOT AT [facility])    Imaging Review Dg Chest 2 View  04/14/2016  CLINICAL DATA:  Dizziness and chest pressure EXAM: CHEST  2 VIEW COMPARISON:  04/04/2015 FINDINGS: The heart size and mediastinal contours are within normal limits. Both lungs are clear. The visualized skeletal structures are unremarkable. IMPRESSION: No active cardiopulmonary disease. Electronically Signed   By: Inez Catalina M.D.   On: 04/14/2016 09:55   Ct Head Wo Contrast  04/14/2016  CLINICAL DATA:  Numbness and tingling in both hands EXAM: CT HEAD WITHOUT CONTRAST TECHNIQUE:  Contiguous axial images were obtained from the base of the skull through the vertex without intravenous contrast. COMPARISON:  07/04/2012 FINDINGS: Bony calvarium is intact. Very mild atrophic changes are noted. No findings to suggest acute hemorrhage, acute infarction or space-occupying mass lesion are noted. IMPRESSION: No acute intracranial abnormality noted. Electronically Signed   By: Inez Catalina M.D.   On: 04/14/2016 09:50   I have personally reviewed and evaluated these images and lab results as part of my medical decision-making.   EKG Interpretation   Date/Time:  Wednesday April 14 2016 09:17:22 EDT Ventricular Rate:  45 PR Interval:  185 QRS Duration: 157 QT Interval:  498 QTC Calculation: 431 R Axis:   -60 Text Interpretation:  Sinus bradycardia RBBB and LAFB Left ventricular  hypertrophy besides bradycardia, no significant change since 2016  Confirmed by Allister Lessley  MD, Saesha Llerenas ([id number]) on 04/14/2016 9:35:22 AM      MDM   Final diagnoses:  Chest pressure  Lightheadedness  Sinus bradycardia    Patient is an overall poor historian but does have concerning symptoms such as his intermittent chest pressure and lightheadedness. He is on rate controlling medicines for atrophic  fibrillation but his heart rate has dipped down into the 40s on multiple occasions. This could be contributing to his lightheadedness. He also describes some symptoms that could be consistent with vertigo, Antivert did not really help. At this point he has a moderate HEART score. He is chest pain-free. I will he was admitted to [facility] for observation, ACS rule out, and likely cardiology consult. Dr. Waldron Labs accepts in admission and transfer.  Sherwood Gambler, MD 04/14/16 1516  Sherwood Gambler, MD 06/01/16 1500

## 2016-04-15 ENCOUNTER — Encounter (HOSPITAL_COMMUNITY)
Admission: EM | Disposition: A | Discharge status: Home / Self Care | Payer: Self-pay | Source: Home / Self Care | Attending: Internal Medicine

## 2016-04-15 ENCOUNTER — Observation Stay (HOSPITAL_COMMUNITY): Payer: Commercial Managed Care - HMO

## 2016-04-15 DIAGNOSIS — I4519 Other right bundle-branch block: Secondary | ICD-10-CM | POA: Diagnosis present

## 2016-04-15 DIAGNOSIS — E785 Hyperlipidemia, unspecified: Secondary | ICD-10-CM | POA: Diagnosis present

## 2016-04-15 DIAGNOSIS — I2510 Atherosclerotic heart disease of native coronary artery without angina pectoris: Secondary | ICD-10-CM | POA: Diagnosis present

## 2016-04-15 DIAGNOSIS — R001 Bradycardia, unspecified: Secondary | ICD-10-CM | POA: Insufficient documentation

## 2016-04-15 DIAGNOSIS — I480 Paroxysmal atrial fibrillation: Secondary | ICD-10-CM

## 2016-04-15 DIAGNOSIS — R079 Chest pain, unspecified: Secondary | ICD-10-CM | POA: Diagnosis present

## 2016-04-15 DIAGNOSIS — Z8249 Family history of ischemic heart disease and other diseases of the circulatory system: Secondary | ICD-10-CM | POA: Diagnosis not present

## 2016-04-15 DIAGNOSIS — C9210 Chronic myeloid leukemia, BCR/ABL-positive, not having achieved remission: Secondary | ICD-10-CM

## 2016-04-15 DIAGNOSIS — I495 Sick sinus syndrome: Secondary | ICD-10-CM | POA: Diagnosis present

## 2016-04-15 DIAGNOSIS — I10 Essential (primary) hypertension: Secondary | ICD-10-CM

## 2016-04-15 DIAGNOSIS — R42 Dizziness and giddiness: Secondary | ICD-10-CM | POA: Diagnosis not present

## 2016-04-15 DIAGNOSIS — Z806 Family history of leukemia: Secondary | ICD-10-CM | POA: Diagnosis not present

## 2016-04-15 DIAGNOSIS — Z8673 Personal history of transient ischemic attack (TIA), and cerebral infarction without residual deficits: Secondary | ICD-10-CM | POA: Diagnosis not present

## 2016-04-15 DIAGNOSIS — F17210 Nicotine dependence, cigarettes, uncomplicated: Secondary | ICD-10-CM | POA: Diagnosis present

## 2016-04-15 DIAGNOSIS — I209 Angina pectoris, unspecified: Secondary | ICD-10-CM

## 2016-04-15 DIAGNOSIS — Z955 Presence of coronary angioplasty implant and graft: Secondary | ICD-10-CM | POA: Diagnosis not present

## 2016-04-15 DIAGNOSIS — Z7901 Long term (current) use of anticoagulants: Secondary | ICD-10-CM

## 2016-04-15 HISTORY — PX: EP IMPLANTABLE DEVICE: SHX172B

## 2016-04-15 LAB — NM MYOCAR MULTI W/SPECT W/WALL MOTION / EF
CHL CUP RESTING HR STRESS: 70 {beats}/min
CSEPED: 8 min
Estimated workload: 1 METS
Exercise duration (sec): 13 s
MPHR: 152 {beats}/min
Peak HR: 112 {beats}/min
Percent HR: 73 %

## 2016-04-15 LAB — MRSA PCR SCREENING: MRSA by PCR: POSITIVE — AB

## 2016-04-15 LAB — TSH: TSH: 1.858 u[IU]/mL (ref 0.350–4.500)

## 2016-04-15 LAB — TROPONIN I: Troponin I: <0.03 ng/mL (ref <0.031)

## 2016-04-15 SURGERY — PACEMAKER IMPLANT
Anesthesia: LOCAL

## 2016-04-15 MED ORDER — SODIUM CHLORIDE 0.9% FLUSH: 3.0000 mL | Freq: Two times a day (BID) | INTRAVENOUS | Status: DC

## 2016-04-15 MED ORDER — ONDANSETRON HCL 4 MG/2ML IJ SOLN: 4.0000 mg | Freq: Four times a day (QID) | INTRAMUSCULAR | Status: DC | PRN

## 2016-04-15 MED ORDER — METOPROLOL TARTRATE 1 MG/ML IV SOLN
INTRAVENOUS | Status: DC | PRN
  Administered 2016-04-15: 5 mg via INTRAVENOUS

## 2016-04-15 MED ORDER — CEFAZOLIN SODIUM-DEXTROSE 2-4 GM/100ML-% IV SOLN
2.0000 g | INTRAVENOUS | Status: AC
  Administered 2016-04-15: 2 g via INTRAVENOUS

## 2016-04-15 MED ORDER — MIDAZOLAM HCL 5 MG/5ML IJ SOLN
INTRAMUSCULAR | Status: AC
  Filled 2016-04-15: qty 5

## 2016-04-15 MED ORDER — TECHNETIUM TC 99M SESTAMIBI GENERIC - CARDIOLITE
30.0000 | Freq: Once | INTRAVENOUS | Status: AC | PRN
  Administered 2016-04-15: 30 via INTRAVENOUS

## 2016-04-15 MED ORDER — LIDOCAINE HCL (PF) 1 % IJ SOLN
INTRAMUSCULAR | Status: AC
  Filled 2016-04-15: qty 60

## 2016-04-15 MED ORDER — HEPARIN (PORCINE) IN NACL 2-0.9 UNIT/ML-% IJ SOLN
INTRAMUSCULAR | Status: DC | PRN
  Administered 2016-04-15: 17:00:00

## 2016-04-15 MED ORDER — CEFAZOLIN SODIUM-DEXTROSE 2-4 GM/100ML-% IV SOLN
2.0000 g | Freq: Four times a day (QID) | INTRAVENOUS | Status: AC
  Administered 2016-04-15 – 2016-04-16 (×3): 2 g via INTRAVENOUS
  Filled 2016-04-15 (×3): qty 100

## 2016-04-15 MED ORDER — FENTANYL CITRATE (PF) 100 MCG/2ML IJ SOLN
INTRAMUSCULAR | Status: AC
  Filled 2016-04-15: qty 2

## 2016-04-15 MED ORDER — CEFAZOLIN SODIUM-DEXTROSE 2-3 GM-% IV SOLR
INTRAVENOUS | Status: AC
  Filled 2016-04-15: qty 50

## 2016-04-15 MED ORDER — LIDOCAINE HCL (PF) 1 % IJ SOLN
INTRAMUSCULAR | Status: DC | PRN
  Administered 2016-04-15: 44 mL

## 2016-04-15 MED ORDER — MIDAZOLAM HCL 5 MG/5ML IJ SOLN
INTRAMUSCULAR | Status: DC | PRN
  Administered 2016-04-15 (×3): 1 mg via INTRAVENOUS

## 2016-04-15 MED ORDER — ACETAMINOPHEN 325 MG PO TABS: 325.0000 mg | ORAL_TABLET | ORAL | Status: DC | PRN

## 2016-04-15 MED ORDER — SODIUM CHLORIDE 0.9 % IR SOLN
80.0000 mg | Status: AC
  Administered 2016-04-15: 80 mg

## 2016-04-15 MED ORDER — SODIUM CHLORIDE 0.9% FLUSH: 3.0000 mL | INTRAVENOUS | Status: DC | PRN

## 2016-04-15 MED ORDER — REGADENOSON 0.4 MG/5ML IV SOLN
INTRAVENOUS | Status: AC
  Administered 2016-04-15: 0.4 mg via INTRAVENOUS
  Filled 2016-04-15: qty 5

## 2016-04-15 MED ORDER — SODIUM CHLORIDE 0.9 % IV SOLN: 250.0000 mL | INTRAVENOUS | Status: DC

## 2016-04-15 MED ORDER — SODIUM CHLORIDE 0.9 % IV SOLN
INTRAVENOUS | Status: DC
  Administered 2016-04-15: 16:00:00 via INTRAVENOUS

## 2016-04-15 MED ORDER — SODIUM CHLORIDE 0.9 % IR SOLN
Status: AC
  Filled 2016-04-15: qty 2

## 2016-04-15 MED ORDER — REGADENOSON 0.4 MG/5ML IV SOLN
0.4000 mg | Freq: Once | INTRAVENOUS | Status: AC
  Administered 2016-04-15: 0.4 mg via INTRAVENOUS
  Filled 2016-04-15: qty 5

## 2016-04-15 MED ORDER — TECHNETIUM TC 99M SESTAMIBI GENERIC - CARDIOLITE
10.0000 | Freq: Once | INTRAVENOUS | Status: AC | PRN
  Administered 2016-04-15: 10 via INTRAVENOUS

## 2016-04-15 MED ORDER — FENTANYL CITRATE (PF) 100 MCG/2ML IJ SOLN
INTRAMUSCULAR | Status: DC | PRN
  Administered 2016-04-15: 12.5 ug via INTRAVENOUS
  Administered 2016-04-15: 25 ug via INTRAVENOUS
  Administered 2016-04-15: 12.5 ug via INTRAVENOUS

## 2016-04-15 MED ORDER — METOPROLOL TARTRATE 1 MG/ML IV SOLN
INTRAVENOUS | Status: AC
  Filled 2016-04-15: qty 5

## 2016-04-15 SURGICAL SUPPLY — 7 items
CABLE SURGICAL S-101-97-12 (CABLE) ×2 IMPLANT
LEAD CAPSURE NOVUS 45CM (Lead) ×2 IMPLANT
LEAD CAPSURE NOVUS 5076-52CM (Lead) ×2 IMPLANT
PAD DEFIB LIFELINK (PAD) ×2 IMPLANT
PPM ADVISA MRI DR A2DR01 (Pacemaker) ×2 IMPLANT
SHEATH CLASSIC 7F (SHEATH) ×4 IMPLANT
TRAY PACEMAKER INSERTION (PACKS) ×2 IMPLANT

## 2016-04-15 NOTE — Progress Notes (Signed)
Episodes of increased HR - patient sitting on EOB - encouraged to lie down. HR returned to 74-80/ minute. Remains NPO for a stress test.

## 2016-04-15 NOTE — Progress Notes (Signed)
Notified via tx Team A - PA - Patient in A Fib with rapid rate - currently 155/ minute.

## 2016-04-15 NOTE — Progress Notes (Signed)
Pt is NPO as he awaits a stress test

## 2016-04-15 NOTE — Care Management Obs Status (Signed)
[facility] NOTIFICATION   Patient Details  Name: Jason [name] MRN: [id number] Date of Birth: February 21, 1946   Medicare Observation Status Notification Given:  Yes (Chest Pain)    Bethena Roys, RN 04/15/2016, 10:20 AM

## 2016-04-15 NOTE — Progress Notes (Signed)
Lexiscan MV performed, 1 day study, [place] to read.   Rosaria Ferries, PA-C 04/15/2016 1:11 PM Beeper [phone]

## 2016-04-15 NOTE — Progress Notes (Signed)
Updated PA for EP - Rhonda Barrett, PA -  In A Fib since return from stress test, rate fluctuating 130-135, at times as high as 150, currently 110-115. Without symptoms, denies complaints and O2 SATs 99% room air. PA confirmed may have pacemaker placed this afternoon. Confirmed NPO - will have patient sign consent.

## 2016-04-15 NOTE — Consult Note (Signed)
CARDIOLOGY CONSULT NOTE   Patient ID: Jason [name] MRN: [id number] DOB/AGE: [birth year] 68 y.o.  Admit date: 04/14/2016  Primary Physician   Penni Homans, MD Primary Cardiologist   [name] Reason for Consultation   Chest pain Requesting Physician  Dr. Cruzita Lederer  HPI: Jason [name] is a 70 y.o. male with a history of CAD s/p PCI, HTN, HL, PAF on Xarelto, IBS, CVA, CML followed by Dr. Marin Olp  and prior syncope episode who presented to [facility] ED for evaluation of lightheadedness and chest pain.   Patient had stents placed at [facility] in 2000. Records are not available.  Echocardiogram 09/2012 shows left ventricular function of 60% with mild LVH and mild mitral regurgitation, no wall motion abnormality. Abdominal ultrasound August 2015 showed a 3.9 cm abdominal aortic aneurysm.  Nuclear study April 2016 showed large inferior, inferior septal, inferior lateral defect consistent with infarct and no ischemia. Ejection fraction 45%. Monitor April 2016 showed paroxysmal atrial fibrillation and Toprol increased.   Last seen by [name] 05/28/2015 and at that time he complained of dyspnea with more extreme activities but not with routine activities. It is relieved with rest. It is not associated with chest pain.   He presented to [facility] ED [phone] for 4-5 days history of intermittent chest pressure and dizziness. He states that he also having exertional chest pressure that relieved with rest. Unable to provide duration and has chronic shortness of breath however worsened recently. Patient also having intermittent heart fluttering. This occurs with and without chest pressure as well. Unable to provide duration. Patient also having dizziness and lightheadedness  with this episodes. Yesterday he had a worse episode of chest pressure along with the palpitation that was associated with nausea and severe shortness of breath leading to further evaluation. His chest pressure is  substernal. Denies radiation of pain. Patient denies lower extremity edema, orthopnea, PND or syncope. The patient and her stool having 2 syncope episode either 2015 or 2016. It was felt  "tunnel vision" by primary care provider. Status improved on meclizine however second episode occurred while on meclizine. He states that both episodes occurred suddenly without any prodrome of dizziness or feeling lightheadedness.  He also has black stool as patient on iron supplement. He denies any recent travel. He continues to smoke.   EKG showed sinus rhythm at rate of 45 bpm, RBBB and LAFB and LVH. Slow rate is new compared to prior EKG. Troponin negative. On admission his Toprol XL reduced to 25mg  from 50mg . Tele showed rate of mostly in 50s however intermittently goes into low 40s and afib at rate of 140s. States feeling chest tightness while interview.   Past Medical History  Diagnosis Date  . CML (chronic myelocytic leukemia) ([place]) 12/16/2011  . History of chicken pox   . Hypertension   . Hyperlipidemia   . History of kidney stones   . Coronary heart disease   . Atrial fibrillation ([place])   . IBS (irritable bowel syndrome) 09/20/2013  . Grief reaction 03/18/2014  . Medicare annual wellness visit, subsequent 03/23/2014  . Anemia 05/08/2015  . GERD (gastroesophageal reflux disease)   . CVA (cerebrovascular accident) [facility])      Past Surgical History  Procedure Laterality Date  . Cholecystectomy  2003  . Coronary angioplasty with stent placement  2000  . Cataract extraction      No Known Allergies  I have reviewed the patient's current medications . amLODipine  5 mg Oral Daily  .  atorvastatin  10 mg Oral Daily  . ferrous sulfate  325 mg Oral BID  . fluticasone  2 spray Each Nare Daily  . imatinib  200 mg Oral Daily  . losartan  100 mg Oral Daily  . metoprolol succinate  25 mg Oral Daily  . nicotine  14 mg Transdermal Daily  . pantoprazole  40 mg Oral Daily  . rivaroxaban  20 mg Oral Q supper      acetaminophen, acetaminophen, meclizine, nitroGLYCERIN, ondansetron (ZOFRAN) IV  Prior to Admission medications   Medication Sig Start Date End Date Taking? Authorizing Provider  acetaminophen (TYLENOL) 325 MG tablet Take 650 mg by mouth every 6 (six) hours as needed. For headache.   Yes Historical Provider, MD  amLODipine (NORVASC) 5 MG tablet Take 1 tablet (5 mg total) by mouth daily. Please schedule appointment for refills. 03/17/16  Yes Lelon Perla, MD  atorvastatin (LIPITOR) 10 MG tablet Take 1 tablet (10 mg total) by mouth daily. 03/15/16  Yes Mosie Lukes, MD  ferrous sulfate 325 (65 FE) MG EC tablet Take 1 tablet (325 mg total) by mouth 2 (two) times daily. 09/02/15  Yes Irene Shipper, MD  fluticasone (FLONASE) 50 MCG/ACT nasal spray Place 2 sprays into both nostrils daily. 03/15/16  Yes Mosie Lukes, MD  imatinib ([name]) 100 MG tablet Take 2 tablets (200 mg total) by mouth daily. Take with meals and large glass of water.Caution:Chemotherapy. 12/12/15  Yes Volanda Napoleon, MD  losartan (COZAAR) 100 MG tablet Take 1 tablet (100 mg total) by mouth daily. 03/15/16  Yes Mosie Lukes, MD  meclizine (ANTIVERT) 25 MG tablet Take 1 tablet (25 mg total) by mouth 3 (three) times daily. Patient taking differently: Take 25 mg by mouth See admin instructions. Pt takes 1 daily at bedtime, but may sometimes take another dose through the day as needed 03/15/16  Yes Mosie Lukes, MD  metoprolol succinate (TOPROL-XL) 50 MG 24 hr tablet Take 1 tablet (50 mg total) by mouth daily. Please schedule appointment for refills. 03/17/16  Yes Lelon Perla, MD  nitroGLYCERIN (NITROSTAT) 0.4 MG SL tablet Place 1 tablet (0.4 mg total) under the tongue every 5 (five) minutes as needed for chest pain. Please schedule appointment for refills. 03/17/16  Yes Lelon Perla, MD  omeprazole (PRILOSEC) 40 MG capsule Take 1 capsule (40 mg total) by mouth daily. 03/15/16  Yes Mosie Lukes, MD  ondansetron (ZOFRAN)  4 MG tablet TAKE ONE TABLET BY MOUTH EVERY 8 HOURS AS NEEDED FOR NAUSEA AND FOR VOMITING 03/21/16  Yes Mosie Lukes, MD  Probiotic Product (PROBIOTIC FORMULA PO) Take 1 capsule by mouth daily.    Yes Historical Provider, MD  rivaroxaban (XARELTO) 20 MG TABS tablet TAKE 1 TABLET (20 MG) BY MOUTH DAILY WITH SUPPER. 03/30/16  Yes Lelon Perla, MD     Social History   Social History  . Marital Status: Widowed    Spouse Name: N/A  . Number of Children: 3  . Years of Education: N/A   Occupational History  . Retired     Retired   Social History Main Topics  . Smoking status: Current Every Day Smoker -- 0.25 packs/day for 40 years    Types: Cigarettes    Start date: 10/30/1967  . Smokeless tobacco: Never Used     Comment: 04-16 still smoking  . Alcohol Use: No     Comment: Rare  . Drug Use: No  . Sexual  Activity: No     Comment: wife died in 02-07-2023 married 9 years, grandson livew with patient   Other Topics Concern  . Not on file   Social History Narrative    Family Status  Relation Status Death Age  . Mother Deceased 64  . Father Deceased 26  . Brother Alive     39  . Daughter Alive     43  . Son Deceased   . Maternal Grandmother Deceased   . Maternal Grandfather Deceased   . Paternal Grandmother Deceased   . Paternal Grandfather Deceased   . Son Alive    Family History  Problem Relation Age of Onset  . Arthritis Mother   . Hypertension Mother   . Heart disease Mother     Rheumatic fever  . Rheumatic fever Mother   . Other Mother     brain tumor  . Cancer Mother     leukemia  . Hypertension Father   . Alcohol abuse Father   . Colon cancer Neg Hx   . Breast cancer Neg Hx   . Prostate cancer Neg Hx   . Diabetes Son      ROS:  Full 14 point review of systems complete and found to be negative unless listed above.  Physical Exam: Blood pressure 137/63, pulse 57, temperature 97.5 F (36.4 C), temperature source Oral, resp. rate 18, height 5\' 8"  (1.727 m),  weight 196 lb 4.8 oz (89.041 kg), SpO2 96 %.  General: Well developed, well nourished, male in no acute distress Head: Eyes PERRLA, No xanthomas. Normocephalic and atraumatic, oropharynx without edema or exudate.  Lungs: Resp regular and unlabored, CTA. Heart: Regular rhythm with slow rate no s3, s4, or murmurs..   Neck: No carotid bruits. No lymphadenopathy.  No JVD. Abdomen: Bowel sounds present, abdomen soft and non-tender without masses or hernias noted. Msk:  No spine or cva tenderness. No weakness, no joint deformities or effusions. Extremities: No clubbing, cyanosis or edema. DP/PT/Radials 2+ and equal bilaterally. Neuro: Alert and oriented X 3. No focal deficits noted. Psych:  Good affect, responds appropriately Skin: No rashes or lesions noted.  Labs:   Lab Results  Component Value Date   WBC 7.2 04/14/2016   HGB 13.7 04/14/2016   HCT 40.7 04/14/2016   MCV 93.1 04/14/2016   PLT 260 04/14/2016   No results for input(s): INR in the last 72 hours.  Recent Labs Lab 04/14/16 0920  NA 142  K 3.6  CL 112*  CO2 23  BUN 10  CREATININE 1.14  CALCIUM 9.2  PROT 7.2  BILITOT 0.7  ALKPHOS 80  ALT 17  AST 22  GLUCOSE 100*  ALBUMIN 4.2   MAGNESIUM  Date Value Ref Range Status  11/28/2013 1.9 1.5 - 2.5 mg/dL Final    Recent Labs  04/14/16 0920 04/14/16 1648 04/14/16 2313 04/15/16 0431  TROPONINI <0.03 <0.03 <0.03 <0.03   No results for input(s): TROPIPOC in the last 72 hours. No results found for: PROBNP Lab Results  Component Value Date   CHOL 131 11/07/2015   HDL 34.80* 11/07/2015   LDLCALC 80 11/07/2015   TRIG 81.0 11/07/2015   Lab Results  Component Value Date   DDIMER 0.44 04/14/2016    Echo: 10/72013LV EF: 60%  ------------------------------------------------------------ Indications:   Atrial fibrillation - 427.31.  ------------------------------------------------------------ History:  PMH: Acquired from the patient and from the patient's  chart. Dyspnea. Atrial fibrillation. Coronary artery disease. Risk factors: Current tobacco use. Hypertension. Dyslipidemia.  ------------------------------------------------------------  Study Conclusions  - Left ventricle: The cavity size was normal. Wall thickness was increased in a pattern of mild LVH. The estimated ejection fraction was 60%. Wall motion was normal; there were no regional wall motion abnormalities. - Aortic valve: Sclerosis without stenosis. Mild regurgitation.  ECG:  EKG showed sinus rhythm at rate of 45 bpm, RBBB and LAFB and LVH. Slow rate is new compared to prior EKG.   Vent. rate 45 BPM PR interval 185 ms QRS duration 157 ms QT/QTc 498/431 ms P-R-T axes 34 -60 -16  Radiology:  Dg Chest 2 View  04/14/2016  CLINICAL DATA:  Dizziness and chest pressure EXAM: CHEST  2 VIEW COMPARISON:  04/04/2015 FINDINGS: The heart size and mediastinal contours are within normal limits. Both lungs are clear. The visualized skeletal structures are unremarkable. IMPRESSION: No active cardiopulmonary disease. Electronically Signed   By: Inez Catalina M.D.   On: 04/14/2016 09:55   Ct Head Wo Contrast  04/14/2016  CLINICAL DATA:  Numbness and tingling in both hands EXAM: CT HEAD WITHOUT CONTRAST TECHNIQUE: Contiguous axial images were obtained from the base of the skull through the vertex without intravenous contrast. COMPARISON:  07/04/2012 FINDINGS: Bony calvarium is intact. Very mild atrophic changes are noted. No findings to suggest acute hemorrhage, acute infarction or space-occupying mass lesion are noted. IMPRESSION: No acute intracranial abnormality noted. Electronically Signed   By: Inez Catalina M.D.   On: 04/14/2016 09:50    ASSESSMENT AND PLAN:     1. Chest pressure - Occurs with and without palpations. Had worse episode yesterday with nausea and severe sob (worse than baseline). Troponin negative. EKG without acute ST or TW abnormality. His chest pain could be  due to afib, worsening CAD or chronic smoking.  - Will get echo and lexiscan.   2. PAF - CHADSVASCS score of at least 5. On Xarelto at home. IV heparin now. On admission his Toprol XL reduced to 25mg  from 50mg . Tele showed rate of mostly in 50s however intermittently goes into low 40s and afib at rate of 140s. - suspect tachy brady. He does have 2 episode of syncope in past. Felt elt  "tunnel vision" by primary care provider. Status improved on meclizine however second episode occurred while on meclizine. He states that both episodes occurred suddenly without any prodrome of dizziness or feeling lightheadedness. Continue current dose of Toprol. Will get EP to ask see the patient.   3. CAD - Patient had stents placed at [facility] in 2000. Records are not available. The patient states that Dr. Elonda Husky did a cath. Will obtain records of cath.    Active Problems:   CML (chronic myelocytic leukemia) ([facility])   CAD- prior stents 2000 by Dr Elonda Husky in [place]   Tobacco use   HTN (hypertension)   Chest pain with moderate risk of acute coronary syndrome   PAF (paroxysmal atrial fibrillation) ([facility])   Chronic anticoagulation   History of CVA (cerebrovascular accident)   Dizziness   Chest pressure   Signed: Radie Berges, PA 04/15/2016, 9:42 AM Pager [phone]  Co-Sign MD

## 2016-04-15 NOTE — Consult Note (Signed)
ELECTROPHYSIOLOGY CONSULT NOTE   Patient ID: Jason [name] MRN: [id number] DOB/AGE: 1946-08-05 70 y.o.  Admit date: 04/14/2016  Primary Physician   Jason Homans, MD Primary Cardiologist   Jason [name] Reason for Consultation   PAF, tachy/brady  [id number] Jason [name] is a 70 y.o. year old male with a history of PCI 2000 in [place], HTN, HL, PAF on Xarelto, IBS, CVA, CML followed by Jason. Marin [name] and prior syncope episode, tobacco use, who was admitted 04/19 for lightheadedness and chest pain.   He was noted to have several episodes of PAF/RVR overnight and also had bradycardia and pauses, EP asked to evaluate.  Jason [name] is very aware of the PAF. The episodes start with heart pounding, then he will get nausea +/- abdominal pain, then get SOB, then get chest pressure. The symptoms will improve when his HR goes back to normal but will linger for a while.   He denies other episodes of presyncope, no recent syncope. He is never aware of a low HR. He gets PAF one or more x per day.     Past Medical History  Diagnosis Date  . CML (chronic myelocytic leukemia) ([place]) 12/16/2011  . History of chicken pox   . Hypertension   . Hyperlipidemia   . History of kidney stones   . Coronary heart disease   . Atrial fibrillation ([place])   . IBS (irritable bowel syndrome) 09/20/2013  . Grief reaction 03/18/2014  . Medicare annual wellness visit, subsequent 03/23/2014  . Anemia 05/08/2015  . GERD (gastroesophageal reflux disease)   . CVA (cerebrovascular accident) [facility])      Past Surgical History  Procedure Laterality Date  . Cholecystectomy  2003  . Coronary angioplasty with stent placement  2000  . Cataract extraction      No Known Allergies  I have reviewed the patient's current medications . amLODipine  5 mg Oral Daily  . atorvastatin  10 mg Oral Daily  . ferrous sulfate  325 mg Oral BID  . fluticasone  2 spray Each Nare Daily  . imatinib  200 mg Oral Daily  . losartan  100 mg Oral Daily  .  metoprolol succinate  25 mg Oral Daily  . nicotine  14 mg Transdermal Daily  . pantoprazole  40 mg Oral Daily  . rivaroxaban  20 mg Oral Q supper     acetaminophen, acetaminophen, meclizine, nitroGLYCERIN, ondansetron (ZOFRAN) IV  Medication Sig  acetaminophen (TYLENOL) 325 MG tablet Take 650 mg by mouth every 6 (six) hours as needed. For headache.  amLODipine (NORVASC) 5 MG tablet Take 1 tablet (5 mg total) by mouth daily. Please schedule appointment for refills.  atorvastatin (LIPITOR) 10 MG tablet Take 1 tablet (10 mg total) by mouth daily.  ferrous sulfate 325 (65 FE) MG EC tablet Take 1 tablet (325 mg total) by mouth 2 (two) times daily.  fluticasone (FLONASE) 50 MCG/ACT nasal spray Place 2 sprays into both nostrils daily.  imatinib ([name]) 100 MG tablet Take 2 tablets (200 mg total) by mouth daily. Take with meals and large glass of water.Caution:Chemotherapy.  losartan (COZAAR) 100 MG tablet Take 1 tablet (100 mg total) by mouth daily.  meclizine (ANTIVERT) 25 MG tablet Take 1 tablet (25 mg total) by mouth 3 (three) times daily. Patient taking differently: Take 25 mg by mouth See admin instructions. Pt takes 1 daily at bedtime, but may sometimes take another dose through the day as needed  metoprolol succinate (TOPROL-XL) 50 MG  24 hr tablet Take 1 tablet (50 mg total) by mouth daily. Please schedule appointment for refills.  nitroGLYCERIN (NITROSTAT) 0.4 MG SL tablet Place 1 tablet (0.4 mg total) under the tongue every 5 (five) minutes as needed for chest pain. Please schedule appointment for refills.  omeprazole (PRILOSEC) 40 MG capsule Take 1 capsule (40 mg total) by mouth daily.  ondansetron (ZOFRAN) 4 MG tablet TAKE ONE TABLET BY MOUTH EVERY 8 HOURS AS NEEDED FOR NAUSEA AND FOR VOMITING  Probiotic Product (PROBIOTIC FORMULA PO) Take 1 capsule by mouth daily.   rivaroxaban (XARELTO) 20 MG TABS tablet TAKE 1 TABLET (20 MG) BY MOUTH DAILY WITH SUPPER.     Social History   Social  History  . Marital Status: Widowed    Spouse Name: N/A  . Number of Children: 3  . Years of Education: N/A   Occupational History  . Retired     Retired   Social History Main Topics  . Smoking status: Current Every Day Smoker -- 0.25 packs/day for 40 years    Types: Cigarettes    Start date: 10/30/1967  . Smokeless tobacco: Never Used     Comment: 04-16 still smoking  . Alcohol Use: No     Comment: Rare  . Drug Use: No  . Sexual Activity: No     Comment: wife died in 01-20-2023 married 49 years, grandson livew with patient   Other Topics Concern  . Not on file   Social History Narrative    Family Status  Relation Status Death Age  . Mother Deceased 42  . Father Deceased 21  . Brother Alive     66  . Daughter Alive     32  . Son Deceased   . Maternal Grandmother Deceased   . Maternal Grandfather Deceased   . Paternal Grandmother Deceased   . Paternal Grandfather Deceased   . Son Alive    Family History  Problem Relation Age of Onset  . Arthritis Mother   . Hypertension Mother   . Heart disease Mother     Rheumatic fever  . Rheumatic fever Mother   . Other Mother     brain tumor  . Cancer Mother     leukemia  . Hypertension Father   . Alcohol abuse Father   . Colon cancer Neg Hx   . Breast cancer Neg Hx   . Prostate cancer Neg Hx   . Diabetes Son      ROS:  Full 14 point review of systems complete and found to be negative unless listed above.  Physical Exam: Blood pressure 151/82, pulse 71, temperature 98.2 F (36.8 C), temperature source Oral, resp. rate 20, height 5\' 8"  (1.727 m), weight 196 lb 4.8 oz (89.041 kg), SpO2 99 %.  General: Well developed, well nourished, male in no acute distress Head: Eyes PERRLA, No xanthomas.   Normocephalic and atraumatic, oropharynx without edema or exudate. Dentition: poor Lungs: essentially clear bilaterally Heart: IRRR S1 S2, no rub/gallop, soft  murmur. pulses are 2+ all 4 extrem.   Neck: No carotid bruits. No  lymphadenopathy.  JVD not elevated. Abdomen: Bowel sounds present, abdomen soft and non-tender without masses or hernias noted. Msk:  No spine or cva tenderness. No weakness, no joint deformities or effusions. Extremities: No clubbing or cyanosis. no edema.  Neuro: Alert and oriented X 3. No focal deficits noted. Psych:  Good affect, responds appropriately Skin: No rashes or lesions noted.  Labs:   Lab Results  Component Value Date   WBC 7.2 04/14/2016   HGB 13.7 04/14/2016   HCT 40.7 04/14/2016   MCV 93.1 04/14/2016   PLT 260 04/14/2016    Recent Labs Lab 04/14/16 0920  NA 142  K 3.6  CL 112*  CO2 23  BUN 10  CREATININE 1.14  CALCIUM 9.2  PROT 7.2  BILITOT 0.7  ALKPHOS 80  ALT 17  AST 22  GLUCOSE 100*  ALBUMIN 4.2   MAGNESIUM  Date Value Ref Range Status  11/28/2013 1.9 1.5 - 2.5 mg/dL Final    Recent Labs  04/14/16 0920 04/14/16 1648 04/14/16 2313 04/15/16 0431  TROPONINI <0.03 <0.03 <0.03 <0.03    Lab Results  Component Value Date   DDIMER 0.44 04/14/2016   TSH  Date/Time Value Ref Range Status  11/07/2015 11:49 AM 2.20 0.35 - 4.50 uIU/mL Final   Echo and MV: pending  ECG:  04/20 SR, RBBB and LAFB are old.  Radiology:  Dg Chest 2 View  04/14/2016  CLINICAL DATA:  Dizziness and chest pressure EXAM: CHEST  2 VIEW COMPARISON:  04/04/2015 FINDINGS: The heart size and mediastinal contours are within normal limits. Both lungs are clear. The visualized skeletal structures are unremarkable. IMPRESSION: No active cardiopulmonary disease. Electronically Signed   By: Inez Catalina M.D.   On: 04/14/2016 09:55   Ct Head Wo Contrast  04/14/2016  CLINICAL DATA:  Numbness and tingling in both hands EXAM: CT HEAD WITHOUT CONTRAST TECHNIQUE: Contiguous axial images were obtained from the base of the skull through the vertex without intravenous contrast. COMPARISON:  07/04/2012 FINDINGS: Bony calvarium is intact. Very mild atrophic changes are noted. No findings to  suggest acute hemorrhage, acute infarction or space-occupying mass lesion are noted. IMPRESSION: No acute intracranial abnormality noted. Electronically Signed   By: Inez Catalina M.D.   On: 04/14/2016 09:50    ASSESSMENT AND PLAN:   The patient was seen today by Jason Lovena Le, the patient evaluated and the data reviewed.   1.  PAF (paroxysmal atrial fibrillation) ([place]) - telemetry reviewed - he had multiple episodes PAF since admission, all w/ HR > 130 -  Has also had sinus brady in the mid-40s and 1 pause of 4.6 sec approx 11:30 pm - the pause was NOT post-termination - cannot increase BB, concern for antiarrhythmics causing more bradycardia - Keep NPO for possible PPM later today.  2.  Chest pain with moderate risk of acute coronary syndrome - ez neg MI - MV performed, results pending  3. Chronic anticoagulation - CHADS2VASC=5 (age, HTN, CAD, CVA x 2) - on Xarelto, got this last pm  Otherwise, per IM Active Problems:   CML (chronic myelocytic leukemia) ([facility])   CAD- prior stents 2000 by Jason Elonda Husky in [place]   Tobacco use   HTN (hypertension)   History of CVA (cerebrovascular accident)   Dizziness   Chest pressure   Sinus bradycardia   SignedLenoard Aden 04/15/2016 11:50 AM Beeper [id number]  EP Attending  Patient seen and examined. Agree with the findings as noted above with minimal modification. The patient has paroxysmal atrial fibrillation with a very rapid ventricular response. In addition, he has severe sinus node dysfunction. We are limited in up titration of his beta blocker. He is not a great candidate for antiarrhythmic drug therapy. I've recommended permanent pacemaker insertion and further up titration of his AV nodal blocking drugs. I have discussed the risk, benefits, goals, and expectations of permanent pacemaker insertion and the patient  wishes to proceed.  Cristopher Peru, M.D.

## 2016-04-15 NOTE — Progress Notes (Signed)
PROGRESS NOTE  Jason [name] [id number] DOB: August 01, 1946 DOA: 04/14/2016 PCP: Penni Homans, MD Outpatient Specialists:    LOS: 1 day   Brief Narrative: 70 y.o. male with medical history significant of CAD, HLD, PAF, HTN, HLD, tobacco abuse, presented to the ED with chief complain of chest pain  Assessment & Plan: Active Problems:   CML (chronic myelocytic leukemia) (HCC)   CAD- prior stents 2000 by Dr Elonda Husky in [place]   Tobacco use   HTN (hypertension)   Chest pain with moderate risk of acute coronary syndrome   PAF (paroxysmal atrial fibrillation) (HCC)   Chronic anticoagulation   History of CVA (cerebrovascular accident)   Dizziness   Chest pressure   Sinus bradycardia   Chest pain with known coronary artery disease - cardiology consulted, appreciate input, stress test today - cardiac enzymes negative overnight - No chest pain this morning  Paroxysmal atrial fibrillation - patient with sinus bradycardia on admission, metoprolol decreased to 25 mg. - had bursts of A. Fib with RVR overnight with heart rate in the 130s, with bradycardic events intermittently. Symptomatic. - ~4 sec pause last night - EP consulted for eval for PPM - on Xarelto  Hypertension - resume his home medications  CML - Followed by oncology as an outpatient, stable  Dizziness - This is chronic, has not changed in quality - ? Related to #2  Tobacco use - Strongly counseled for cessation - Nicotine patch   DVT prophylaxis: Xarelto Code Status: Full Family Communication: no family bedside Disposition Plan: home when ready  Barriers for discharge: EP evaluation  Consultants:   Cardiology   Procedures:   None   Antimicrobials:  None    Subjective: - feeling "OK" now but lightheaded while having palpitations this morning. Also with nausea.  Objective: Filed Vitals:   04/15/16 1146 04/15/16 1156 04/15/16 1157 04/15/16 1158  BP: 149/79 132/77  146/76  Pulse: 73 94 91   Temp:       TempSrc:      Resp:      Height:      Weight:      SpO2:        Intake/Output Summary (Last 24 hours) at 04/15/16 1244 Last data filed at 04/14/16 1352  Gross per 24 hour  Intake    600 ml  Output    400 ml  Net    200 ml   Filed Weights   04/14/16 0856 04/14/16 1526  Weight: 90.719 kg (200 lb) 89.041 kg (196 lb 4.8 oz)    Examination: Constitutional: NAD Filed Vitals:   04/15/16 1146 04/15/16 1156 04/15/16 1157 04/15/16 1158  BP: 149/79 132/77  146/76  Pulse: 73 94 91   Temp:      TempSrc:      Resp:      Height:      Weight:      SpO2:       ENMT: Mucous membranes are moist.  Respiratory: clear to auscultation bilaterally, no wheezing, no crackles. Cardiovascular: Regular rate and rhythm, no murmurs / rubs / gallops. No extremity edema. 2+ pedal pulses. No carotid bruits.  Abdomen: no tenderness, bowel sounds positive.    Data Reviewed: I have personally reviewed following labs and imaging studies  CBC:  Recent Labs Lab 04/14/16 0920  WBC 7.2  NEUTROABS 4.3  HGB 13.7  HCT 40.7  MCV 93.1  PLT 123456   Basic Metabolic Panel:  Recent Labs Lab 04/14/16 0920  NA 142  K  3.6  CL 112*  CO2 23  GLUCOSE 100*  BUN 10  CREATININE 1.14  CALCIUM 9.2   GFR: Estimated Creatinine Clearance: 67.2 mL/min (by C-G formula based on Cr of 1.14). Liver Function Tests:  Recent Labs Lab 04/14/16 0920  AST 22  ALT 17  ALKPHOS 80  BILITOT 0.7  PROT 7.2  ALBUMIN 4.2   No results for input(s): LIPASE, AMYLASE in the last 168 hours. No results for input(s): AMMONIA in the last 168 hours. Coagulation Profile: No results for input(s): INR, PROTIME in the last 168 hours. Cardiac Enzymes:  Recent Labs Lab 04/14/16 0920 04/14/16 1648 04/14/16 2313 04/15/16 0431  TROPONINI <0.03 <0.03 <0.03 <0.03   BNP (last 3 results) No results for input(s): PROBNP in the last 8760 hours. HbA1C: No results for input(s): HGBA1C in the last 72 hours. CBG: No results for  input(s): GLUCAP in the last 168 hours. Lipid Profile: No results for input(s): CHOL, HDL, LDLCALC, TRIG, CHOLHDL, LDLDIRECT in the last 72 hours. Thyroid Function Tests: No results for input(s): TSH, T4TOTAL, FREET4, T3FREE, THYROIDAB in the last 72 hours. Anemia Panel: No results for input(s): VITAMINB12, FOLATE, FERRITIN, TIBC, IRON, RETICCTPCT in the last 72 hours. Urine analysis:    Component Value Date/Time   COLORURINE YELLOW 04/14/2016 1025   APPEARANCEUR CLEAR 04/14/2016 1025   LABSPEC 1.008 04/14/2016 1025   PHURINE 5.5 04/14/2016 1025   GLUCOSEU NEGATIVE 04/14/2016 1025   HGBUR NEGATIVE 04/14/2016 1025   BILIRUBINUR NEGATIVE 04/14/2016 1025   KETONESUR NEGATIVE 04/14/2016 1025   PROTEINUR NEGATIVE 04/14/2016 1025   UROBILINOGEN 0.2 06/27/2014 1315   NITRITE NEGATIVE 04/14/2016 1025   LEUKOCYTESUR NEGATIVE 04/14/2016 1025   Sepsis Labs: Invalid input(s): PROCALCITONIN, LACTICIDVEN  No results found for this or any previous visit (from the past 240 hour(s)).    Radiology Studies: Dg Chest 2 View  04/14/2016  CLINICAL DATA:  Dizziness and chest pressure EXAM: CHEST  2 VIEW COMPARISON:  04/04/2015 FINDINGS: The heart size and mediastinal contours are within normal limits. Both lungs are clear. The visualized skeletal structures are unremarkable. IMPRESSION: No active cardiopulmonary disease. Electronically Signed   By: Inez Catalina M.D.   On: 04/14/2016 09:55   Ct Head Wo Contrast  04/14/2016  CLINICAL DATA:  Numbness and tingling in both hands EXAM: CT HEAD WITHOUT CONTRAST TECHNIQUE: Contiguous axial images were obtained from the base of the skull through the vertex without intravenous contrast. COMPARISON:  07/04/2012 FINDINGS: Bony calvarium is intact. Very mild atrophic changes are noted. No findings to suggest acute hemorrhage, acute infarction or space-occupying mass lesion are noted. IMPRESSION: No acute intracranial abnormality noted. Electronically Signed   By: Inez Catalina M.D.   On: 04/14/2016 09:50     Scheduled Meds: . amLODipine  5 mg Oral Daily  . atorvastatin  10 mg Oral Daily  . ferrous sulfate  325 mg Oral BID  . fluticasone  2 spray Each Nare Daily  . imatinib  200 mg Oral Daily  . losartan  100 mg Oral Daily  . metoprolol succinate  25 mg Oral Daily  . nicotine  14 mg Transdermal Daily  . pantoprazole  40 mg Oral Daily  . rivaroxaban  20 mg Oral Q supper   Continuous Infusions:    Marzetta Board, MD, PhD [facility] Pager [phone]  If 7PM-7AM, please contact night-coverage [email or web address] Password [facility] 04/15/2016, 12:44 PM

## 2016-04-16 ENCOUNTER — Encounter (HOSPITAL_COMMUNITY): Payer: Self-pay | Admitting: Internal Medicine

## 2016-04-16 ENCOUNTER — Encounter: Payer: Self-pay | Admitting: Hematology & Oncology

## 2016-04-16 ENCOUNTER — Inpatient Hospital Stay (HOSPITAL_COMMUNITY): Payer: Commercial Managed Care - HMO

## 2016-04-16 DIAGNOSIS — R079 Chest pain, unspecified: Secondary | ICD-10-CM

## 2016-04-16 DIAGNOSIS — I495 Sick sinus syndrome: Secondary | ICD-10-CM

## 2016-04-16 LAB — ECHOCARDIOGRAM COMPLETE
HEIGHTINCHES: 68 in
Weight: 3140.8 oz

## 2016-04-16 MED ORDER — NICOTINE 14 MG/24HR TD PT24: 14.0000 mg | MEDICATED_PATCH | Freq: Every day | TRANSDERMAL | Status: DC

## 2016-04-16 MED ORDER — METOPROLOL TARTRATE 50 MG PO TABS: 50.0000 mg | ORAL_TABLET | Freq: Two times a day (BID) | ORAL | Status: DC

## 2016-04-16 MED ORDER — METOPROLOL TARTRATE 50 MG PO TABS
50.0000 mg | ORAL_TABLET | Freq: Two times a day (BID) | ORAL | Status: DC
  Administered 2016-04-16: 50 mg via ORAL
  Filled 2016-04-16: qty 1

## 2016-04-16 MED FILL — Gentamicin Sulfate Inj 40 MG/ML: INTRAMUSCULAR | Qty: 2 | Status: AC

## 2016-04-16 MED FILL — Sodium Chloride Irrigation Soln 0.9%: Qty: 500 | Status: AC

## 2016-04-16 NOTE — Progress Notes (Signed)
Patient Name: Jason [name] Date of Encounter: 04/16/2016   SUBJECTIVE  Feeling well. No chest pain, sob or palpitations. Currently undergoing echo.   CURRENT MEDS . amLODipine  5 mg Oral Daily  . atorvastatin  10 mg Oral Daily  .  ceFAZolin (ANCEF) IV  2 g Intravenous Q6H  . ferrous sulfate  325 mg Oral BID  . fluticasone  2 spray Each Nare Daily  . imatinib  200 mg Oral Daily  . losartan  100 mg Oral Daily  . metoprolol tartrate  50 mg Oral BID  . nicotine  14 mg Transdermal Daily  . pantoprazole  40 mg Oral Daily  . rivaroxaban  20 mg Oral Q supper    OBJECTIVE  Filed Vitals:   04/15/16 1900 04/15/16 2000 04/15/16 2200 04/16/16 0441  BP: 137/65 116/61 133/69 113/63  Pulse: 62 67 67 60  Temp:    98.2 F (36.8 C)  TempSrc:    Oral  Resp:      Height:      Weight:      SpO2: 97% 96% 96% 95%   No intake or output data in the 24 hours ending 04/16/16 1122 Filed Weights   04/14/16 0856 04/14/16 1526  Weight: 200 lb (90.719 kg) 196 lb 4.8 oz (89.041 kg)    PHYSICAL EXAM  General: Pleasant, NAD. Neuro: Alert and oriented X 3. Moves all extremities spontaneously. Psych: Normal affect. HEENT:  Normal  Neck: Supple without bruits or JVD. Lungs:  Resp regular and unlabored, CTA. Stable wound site.  Heart: RRR no s3, s4, or murmurs. Abdomen: Soft, non-tender, non-distended, BS + x 4.  Extremities: No clubbing, cyanosis or edema. DP/PT/Radials 2+ and equal bilaterally.  Accessory Clinical Findings  CBC  Recent Labs  04/14/16 0920  WBC 7.2  NEUTROABS 4.3  HGB 13.7  HCT 40.7  MCV 93.1  PLT 123456   Basic Metabolic Panel  Recent Labs  04/14/16 0920  NA 142  K 3.6  CL 112*  CO2 23  GLUCOSE 100*  BUN 10  CREATININE 1.14  CALCIUM 9.2   Liver Function Tests  Recent Labs  04/14/16 0920  AST 22  ALT 17  ALKPHOS 80  BILITOT 0.7  PROT 7.2  ALBUMIN 4.2   No results for input(s): LIPASE, AMYLASE in the last 72 hours. Cardiac Enzymes  Recent  Labs  04/14/16 1648 04/14/16 2313 04/15/16 0431  TROPONINI <0.03 <0.03 <0.03   BNP Invalid input(s): POCBNP D-Dimer  Recent Labs  04/14/16 0920  DDIMER 0.44   Hemoglobin A1C No results for input(s): HGBA1C in the last 72 hours. Fasting Lipid Panel No results for input(s): CHOL, HDL, LDLCALC, TRIG, CHOLHDL, LDLDIRECT in the last 72 hours. Thyroid Function Tests  Recent Labs  04/15/16 1351  TSH 1.858    TELE  Sinus rhythm at rate of 60s, he again went into afib at rate of 130s  Radiology/Studies  Dg Chest 2 View  04/16/2016  CLINICAL DATA:  Cardiac arrhythmia, status post pacemaker placement EXAM: CHEST  2 VIEW COMPARISON:  April 14, 2016 FINDINGS: There is no a pacemaker on the left with lead tips attached to the right atrium and right ventricle. No pneumothorax. There is no edema or consolidation. There is mild chronic interstitial thickening. Heart size and pulmonary vascularity are normal. No adenopathy. No bone lesions. IMPRESSION: Pacemaker leads are attached to the right atrium and right ventricle. No pneumothorax. Mild chronic interstitial prominence is likely due to inflammatory type change.  No edema or consolidation. Stable cardiac silhouette. Electronically Signed   By: Lowella Grip III M.D.   On: 04/16/2016 07:58   Dg Chest 2 View  04/14/2016  CLINICAL DATA:  Dizziness and chest pressure EXAM: CHEST  2 VIEW COMPARISON:  04/04/2015 FINDINGS: The heart size and mediastinal contours are within normal limits. Both lungs are clear. The visualized skeletal structures are unremarkable. IMPRESSION: No active cardiopulmonary disease. Electronically Signed   By: Inez Catalina M.D.   On: 04/14/2016 09:55   Ct Head Wo Contrast  04/14/2016  CLINICAL DATA:  Numbness and tingling in both hands EXAM: CT HEAD WITHOUT CONTRAST TECHNIQUE: Contiguous axial images were obtained from the base of the skull through the vertex without intravenous contrast. COMPARISON:  07/04/2012  FINDINGS: Bony calvarium is intact. Very mild atrophic changes are noted. No findings to suggest acute hemorrhage, acute infarction or space-occupying mass lesion are noted. IMPRESSION: No acute intracranial abnormality noted. Electronically Signed   By: Inez Catalina M.D.   On: 04/14/2016 09:50   Nm Myocar Multi W/spect W/wall Motion / Ef  04/15/2016  CLINICAL DATA:  70 year old male with chest pain, atrial fibrillation and history coronary disease with angioplasty and stenting. EXAM: MYOCARDIAL IMAGING WITH SPECT (REST AND PHARMACOLOGIC-STRESS) GATED LEFT VENTRICULAR WALL MOTION STUDY LEFT VENTRICULAR EJECTION FRACTION TECHNIQUE: Standard myocardial SPECT imaging was performed after resting intravenous injection of 10 mCi Tc-59m sestamibi. Subsequently, intravenous infusion of Lexiscan was performed under the supervision of the Cardiology staff. At peak effect of the drug, 30 mCi Tc-36m sestamibi was injected intravenously and standard myocardial SPECT imaging was performed. Quantitative gated imaging was also performed to evaluate left ventricular wall motion, and estimate left ventricular ejection fraction. COMPARISON:  Chest radiograph 04/14/2016, EKG stress test FINDINGS: Perfusion: Decrease counts within the apical, mid, and basilar segment of the inferior wall on rest and stress imaging. Decrease counts within the mid and basilar segment of the lateral wall which are fixed on rest and stress. No evidence of reversible ischemia. Wall Motion: Inferior wall hypokinesia. Fixed Ventricular dilatation. Left Ventricular Ejection Fraction: 43 % End diastolic volume 123456 ml End systolic volume 65 ml IMPRESSION: 1. Inferior wall and basilar segment of lateral infarction. No reversible ischemia. 2. LEFT ventricular dilatation.  Inferior wall hypokinesia 3. Left ventricular ejection fraction 43% 4. Intermediate-risk stress test findings*. Intermediate risk secondary to low ejection fraction *2012 Appropriate Use  Criteria for Coronary Revascularization Focused Update: J Am Coll Cardiol. [id number]. http://[email or web address]?[email or web address] Electronically Signed   By: Suzy Bouchard M.D.   On: 04/15/2016 17:04    ASSESSMENT AND PLAN  1. Chest pressure - Occurs with and without palpations. Likely related to rate. Troponin negative. EKG without acute ST or TW abnormality. Myoview was intermediate risk due to low ef (43%), no reversible ischemia. Pending echo. No further chest pain.  - Continue statin, BB and ARB.   2. PAF - CHADSVASCS score of at least 5. Continue Xarelto. He is intermittently goes to afib with RVR. Continue metoprolol 50mg  BID as advised by Dr. Lovena Le. Pending echo. If he continues to goes in and out --> consider adding antiarrhythmic in future.    3. Symptomatic bradycardia due to sinus node dysfunction - S/p RA/RV lead Med[employer]onic pacemaker implantation. Device function normal. Continue BB.    4. Tobacco smoking - Advice cessation. Education given.    Dispo: Discharge later today pending echo. F/u in would clinic in week and APP in 2-3 weeks. Appointment has been made.   Signed, [employer]  PA-C Pager [phone]

## 2016-04-16 NOTE — Discharge Instructions (Addendum)
Supplemental Discharge Instructions for  Pacemaker/Defibrillator Patients  Activity No heavy lifting or vigorous activity with your left/right arm for 6 to 8 weeks.  Do not raise your left/right arm above your head for one week.  Gradually raise your affected arm as drawn below.                       04/25                     04/26                              04/27                            04/28            NO DRIVING for  1 week    ; you may begin driving on     [id number]     . WOUND CARE - Keep the wound area clean and dry.  Do not get this area wet for one week. No showers for one week; you may shower on      04/28        . - The tape/steri-strips on your wound will fall off; do not pull them off.  No bandage is needed on the site.  DO  NOT apply any creams, oils, or ointments to the wound area. - If you notice any drainage or discharge from the wound, any swelling or bruising at the site, or you develop a fever > 101? F after you are discharged home, call the office at once.  Special Instructions - You are still able to use cellular telephones; use the ear opposite the side where you have your pacemaker/defibrillator.  Avoid carrying your cellular phone near your device. - When traveling through airports, show security personnel your identification card to avoid being screened in the metal detectors.  Ask the security personnel to use the hand wand. - Avoid arc welding equipment, MRI testing (magnetic resonance imaging), TENS units (transcutaneous nerve stimulators).  Call the office for questions about other devices. - Avoid electrical appliances that are in poor condition or are not properly grounded. - Microwave ovens are safe to be near or to operate.  Additional information for defibrillator patients should your device go off: - If your device goes off ONCE and you feel fine afterward, notify the device clinic nurses. - If your device goes off ONCE and you do not feel well  afterward, call 911. - If your device goes off TWICE, call 911. - If your device goes off THREE times in one day, call 911.  DO NOT DRIVE YOURSELF OR A FAMILY MEMBER WITH A DEFIBRILLATOR TO THE HOSPITAL--CALL 911.   Information on my medicine - XARELTO (Rivaroxaban)  This medication education was reviewed with me or my healthcare representative as part of my discharge preparation.  The pharmacist that spoke with me during my hospital stay was:  Wayland Salinas, [facility]  Why was Xarelto prescribed for you? Xarelto was prescribed for you to reduce the risk of a blood clot forming that can cause a stroke if you have a medical condition called atrial fibrillation (a type of irregular heartbeat).  What do you need to know about xarelto ? Take your Xarelto ONCE DAILY at the same time every day with your  evening meal. If you have difficulty swallowing the tablet whole, you may crush it and mix in applesauce just prior to taking your dose.  Take Xarelto exactly as prescribed by your doctor and DO NOT stop taking Xarelto without talking to the doctor who prescribed the medication.  Stopping without other stroke prevention medication to take the place of Xarelto may increase your risk of developing a clot that causes a stroke.  Refill your prescription before you run out.  After discharge, you should have regular check-up appointments with your healthcare provider that is prescribing your Xarelto.  In the future your dose may need to be changed if your kidney function or weight changes by a significant amount.  What do you do if you miss a dose? If you are taking Xarelto ONCE DAILY and you miss a dose, take it as soon as you remember on the same day then continue your regularly scheduled once daily regimen the next day. Do not take two doses of Xarelto at the same time or on the same day.   Important Safety Information A possible side effect of Xarelto is bleeding. You should  call your healthcare provider right away if you experience any of the following: ? Bleeding from an injury or your nose that does not stop. ? Unusual colored urine (red or dark brown) or unusual colored stools (red or black). ? Unusual bruising for unknown reasons. ? A serious fall or if you hit your head (even if there is no bleeding).  Some medicines may interact with Xarelto and might increase your risk of bleeding while on Xarelto. To help avoid this, consult your healthcare provider or pharmacist prior to using any new prescription or non-prescription medications, including herbals, vitamins, non-steroidal anti-inflammatory drugs (NSAIDs) and supplements.  This website has more information on Xarelto: [email or web address]-benson.[email or web address]

## 2016-04-16 NOTE — Progress Notes (Signed)
  Echocardiogram 2D Echocardiogram has been performed.  Darlina Sicilian M 04/16/2016, 11:51 AM

## 2016-04-16 NOTE — Discharge Summary (Signed)
Physician Discharge Summary  Jason [name] [id number] DOB: 1946/07/03 DOA: 04/14/2016  PCP: Penni Homans, MD  Admit date: 04/14/2016 Discharge date: 04/16/2016  Time spent: > 30 minutes  Recommendations for Outpatient Follow-up:  1. Follow up with Dr. Marin Olp as scheduled 2. Follow up with cardiology. They will set up appointment 3. Pacemaker implanted during this admission  Discharge Diagnoses:  Active Problems:   CML (chronic myelocytic leukemia) (HCC)   CAD- prior stents 2000 by Dr Elonda Husky in [place]   Tobacco use   HTN (hypertension)   Chest pain with moderate risk of acute coronary syndrome   PAF (paroxysmal atrial fibrillation) (HCC)   Chronic anticoagulation   History of CVA (cerebrovascular accident)   Dizziness   Chest pressure   Sinus bradycardia   Tachy-brady syndrome ([place])  Discharge Condition: stable  Diet recommendation: heart healthy  Filed Weights   04/14/16 0856 04/14/16 1526  Weight: 90.719 kg (200 lb) 89.041 kg (196 lb 4.8 oz)    History of present illness:  Jason [name] is a 70 y.o. male with medical history significant of CAD, HLD, PAF, HTN, HLD, tobacco abuse, presents to the ED with Jason complain of chest pain. Patient states that over the last 3-4 days, he has been experiencing intermittent chest "pressures", coming and going.He is a poor historian and cannot really tell me how long they last, but it doesn't appear that the last long. These are associated with shortness of breath, and feeling like he can't catch his breath. He also endorses nausea with these episodes. He denies any fever or chills, he denies any lightheadedness. He endorses palpitations as feeling his heart "fluttering" at times. He endorses dizziness, this is chronic for him, and has not changed as it was in the last year. He endorses a cough and chest congestion, which she attributes to allergies and is not unusual for him. Is chest pain is located in the middle of his chest,without  radiation. He thinks it's getting worse every time he tries to smoke a cigarette, but there is no association with exertion.  Hospital Course:  Chest pain with known coronary artery disease - patient was admitted to the hospital with chest pain. Cardiology was consulted and have followed patient while hospitalized. He underwent a stress test on 04/15/2016 with inferior wall and basilar segment of lateral infarction. No reversible ischemia. Paroxysmal atrial fibrillation with tachy brady syndrome - patient with sinus bradycardia on admission, and on telemetry it was found to have bursts of PAF with rated into 140s, symptomatic as well as had a 4-5 s pause. EP was consulted andpatient had a pacemaker placed by Dr. Lovena Le. For rate control his Metoprolol was increased to 50 mg BID with improvement in his rates on telemetry. He will have outpatient follow up with cardiology. Patient's CHA2DS2-VASc Score for Stroke Risk is 5, he is on anticoagulation with Xarelto Hypertension - resume his home medications, hold Amlodipine on d/c to allow up titration of metoprolol.  CML - Followed by oncology as an outpatient, stable Dizziness - This is chronic, has not changed in quality, ? Related to #2, improved after PPM.  Tobacco use - Strongly counseled for cessation, Nicotine patch   Procedures:  PPM insertion  Stress test  2D echo  Impressions: - Normal LV size with EF 40-45%, diffuse hypokinesis. Normal RV size and systolic function Mild to moderate aortic insufficiency. Mildly dilated aortic root and dilated ascending aorta  Consultations:  Cardiology, general and EP  Discharge Exam:  Filed Vitals:   04/15/16 2000 04/15/16 2200 04/16/16 0441 04/16/16 1232  BP: 116/61 133/69 113/63 153/68  Pulse: 67 67 60 60  Temp:   98.2 F (36.8 C) 97.7 F (36.5 C)  TempSrc:   Oral Axillary  Resp:    18  Height:      Weight:      SpO2: 96% 96% 95% 99%    General: NAD Cardiovascular: RRR Respiratory: CTA  biL  Discharge Instructions Activity:  As tolerated   Get Medicines reviewed and adjusted: Please take all your medications with you for your next visit with your Primary MD  Please request your Primary MD to go over all hospital tests and procedure/radiological results at the follow up, please ask your Primary MD to get all Hospital records sent to his/her office.  If you experience worsening of your admission symptoms, develop shortness of breath, life threatening emergency, suicidal or homicidal thoughts you must seek medical attention immediately by calling 911 or calling your MD immediately if symptoms less severe.  You must read complete instructions/literature along with all the possible adverse reactions/side effects for all the Medicines you take and that have been prescribed to you. Take any new Medicines after you have completely understood and accpet all the possible adverse reactions/side effects.   Do not drive when taking Pain medications.   Do not take more than prescribed Pain, Sleep and Anxiety Medications  Special Instructions: If you have smoked or chewed Tobacco in the last 2 yrs please stop smoking, stop any regular Alcohol and or any Recreational drug use.  Wear Seat belts while driving.  Please note  You were cared for by a hospitalist during your hospital stay. Once you are discharged, your primary care physician will handle any further medical issues. Please note that NO REFILLS for any discharge medications will be authorized once you are discharged, as it is imperative that you return to your primary care physician (or establish a relationship with a primary care physician if you do not have one) for your aftercare needs so that they can reassess your need for medications and monitor your lab values.    Medication List    STOP taking these medications        amLODipine 5 MG tablet  Commonly known as:  NORVASC     metoprolol succinate 50 MG 24 hr  tablet  Commonly known as:  TOPROL-XL      TAKE these medications        acetaminophen 325 MG tablet  Commonly known as:  TYLENOL  Take 650 mg by mouth every 6 (six) hours as needed. For headache.     atorvastatin 10 MG tablet  Commonly known as:  LIPITOR  Take 1 tablet (10 mg total) by mouth daily.     ferrous sulfate 325 (65 FE) MG EC tablet  Take 1 tablet (325 mg total) by mouth 2 (two) times daily.     fluticasone 50 MCG/ACT nasal spray  Commonly known as:  FLONASE  Place 2 sprays into both nostrils daily.     imatinib 100 MG tablet  Commonly known as:  [name]  Take 2 tablets (200 mg total) by mouth daily. Take with meals and large glass of water.Caution:Chemotherapy.     losartan 100 MG tablet  Commonly known as:  COZAAR  Take 1 tablet (100 mg total) by mouth daily.     meclizine 25 MG tablet  Commonly known as:  ANTIVERT  Take 1 tablet (25  mg total) by mouth 3 (three) times daily.     metoprolol 50 MG tablet  Commonly known as:  LOPRESSOR  Take 1 tablet (50 mg total) by mouth 2 (two) times daily.     nicotine 14 mg/24hr patch  Commonly known as:  NICODERM CQ - dosed in mg/24 hours  Place 1 patch (14 mg total) onto the skin daily.     nitroGLYCERIN 0.4 MG SL tablet  Commonly known as:  NITROSTAT  Place 1 tablet (0.4 mg total) under the tongue every 5 (five) minutes as needed for chest pain. Please schedule appointment for refills.     omeprazole 40 MG capsule  Commonly known as:  PRILOSEC  Take 1 capsule (40 mg total) by mouth daily.     ondansetron 4 MG tablet  Commonly known as:  ZOFRAN  TAKE ONE TABLET BY MOUTH EVERY 8 HOURS AS NEEDED FOR NAUSEA AND FOR VOMITING     PROBIOTIC FORMULA PO  Take 1 capsule by mouth daily.     rivaroxaban 20 MG Tabs tablet  Commonly known as:  XARELTO  TAKE 1 TABLET (20 MG) BY MOUTH DAILY WITH SUPPER.           Follow-up Information    Follow up with [facility]. Go on 04/22/2016.   Specialty:   Cardiology   Why:  for wound check @ 3:00 pm   Contact information:   [address], [place] [phone]      Follow up with [facility]. Go on 05/06/2016.   Specialty:  Cardiology   Why:  @9 :00 am for post hospital with Lonn Georgia, PA-C   Contact information:   [address] [place] [phone]      The results of significant diagnostics from this hospitalization (including imaging, microbiology, ancillary and laboratory) are listed below for reference.    Significant Diagnostic Studies: Dg Chest 2 View  04/16/2016  CLINICAL DATA:  Cardiac arrhythmia, status post pacemaker placement EXAM: CHEST  2 VIEW COMPARISON:  April 14, 2016 FINDINGS: There is no a pacemaker on the left with lead tips attached to the right atrium and right ventricle. No pneumothorax. There is no edema or consolidation. There is mild chronic interstitial thickening. Heart size and pulmonary vascularity are normal. No adenopathy. No bone lesions. IMPRESSION: Pacemaker leads are attached to the right atrium and right ventricle. No pneumothorax. Mild chronic interstitial prominence is likely due to inflammatory type change. No edema or consolidation. Stable cardiac silhouette. Electronically Signed   By: Lowella Grip III M.D.   On: 04/16/2016 07:58   Dg Chest 2 View  04/14/2016  CLINICAL DATA:  Dizziness and chest pressure EXAM: CHEST  2 VIEW COMPARISON:  04/04/2015 FINDINGS: The heart size and mediastinal contours are within normal limits. Both lungs are clear. The visualized skeletal structures are unremarkable. IMPRESSION: No active cardiopulmonary disease. Electronically Signed   By: Inez Catalina M.D.   On: 04/14/2016 09:55   Ct Head Wo Contrast  04/14/2016  CLINICAL DATA:  Numbness and tingling in both hands EXAM: CT HEAD WITHOUT CONTRAST TECHNIQUE: Contiguous axial images were obtained from the base of the skull  through the vertex without intravenous contrast. COMPARISON:  07/04/2012 FINDINGS: Bony calvarium is intact. Very mild atrophic changes are noted. No findings to suggest acute hemorrhage, acute infarction or space-occupying mass lesion are noted. IMPRESSION: No acute intracranial abnormality noted. Electronically Signed  By: Inez Catalina M.D.   On: 04/14/2016 09:50   Nm Myocar Multi W/spect W/wall Motion / Ef  04/15/2016  CLINICAL DATA:  70 year old male with chest pain, atrial fibrillation and history coronary disease with angioplasty and stenting. EXAM: MYOCARDIAL IMAGING WITH SPECT (REST AND PHARMACOLOGIC-STRESS) GATED LEFT VENTRICULAR WALL MOTION STUDY LEFT VENTRICULAR EJECTION FRACTION TECHNIQUE: Standard myocardial SPECT imaging was performed after resting intravenous injection of 10 mCi Tc-82m sestamibi. Subsequently, intravenous infusion of Lexiscan was performed under the supervision of the Cardiology staff. At peak effect of the drug, 30 mCi Tc-68m sestamibi was injected intravenously and standard myocardial SPECT imaging was performed. Quantitative gated imaging was also performed to evaluate left ventricular wall motion, and estimate left ventricular ejection fraction. COMPARISON:  Chest radiograph 04/14/2016, EKG stress test FINDINGS: Perfusion: Decrease counts within the apical, mid, and basilar segment of the inferior wall on rest and stress imaging. Decrease counts within the mid and basilar segment of the lateral wall which are fixed on rest and stress. No evidence of reversible ischemia. Wall Motion: Inferior wall hypokinesia. Fixed Ventricular dilatation. Left Ventricular Ejection Fraction: 43 % End diastolic volume 123456 ml End systolic volume 65 ml IMPRESSION: 1. Inferior wall and basilar segment of lateral infarction. No reversible ischemia. 2. LEFT ventricular dilatation.  Inferior wall hypokinesia 3. Left ventricular ejection fraction 43% 4. Intermediate-risk stress test findings*.  Intermediate risk secondary to low ejection fraction *2012 Appropriate Use Criteria for Coronary Revascularization Focused Update: J Am Coll Cardiol. B5713794. http://content.airportbarriers.com.aspx?articleid=1201161 Electronically Signed   By: Suzy Bouchard M.D.   On: 04/15/2016 17:04    Microbiology: Recent Results (from the past 240 hour(s))  MRSA PCR Screening     Status: Abnormal   Collection Time: 04/15/16  4:10 PM  Result Value Ref Range Status   MRSA by PCR POSITIVE (A) NEGATIVE Final    Comment:        The GeneXpert MRSA Assay (FDA approved for NASAL specimens only), is one component of a comprehensive MRSA colonization surveillance program. It is not intended to diagnose MRSA infection nor to guide or monitor treatment for MRSA infections. RESULT CALLED TO, READ BACK BY AND VERIFIED WITH: M.CALLAHAN,RN AT 1839 BY L.PITT      Labs: Basic Metabolic Panel:  Recent Labs Lab 04/14/16 0920  NA 142  K 3.6  CL 112*  CO2 23  GLUCOSE 100*  BUN 10  CREATININE 1.14  CALCIUM 9.2   Liver Function Tests:  Recent Labs Lab 04/14/16 0920  AST 22  ALT 17  ALKPHOS 80  BILITOT 0.7  PROT 7.2  ALBUMIN 4.2   CBC:  Recent Labs Lab 04/14/16 0920  WBC 7.2  NEUTROABS 4.3  HGB 13.7  HCT 40.7  MCV 93.1  PLT 260   Cardiac Enzymes:  Recent Labs Lab 04/14/16 0920 04/14/16 1648 04/14/16 2313 04/15/16 0431  TROPONINI <0.03 <0.03 <0.03 <0.03   Signed:  Marzetta Board  [facility] 04/16/2016, 2:53 PM

## 2016-04-16 NOTE — Progress Notes (Signed)
Patient ID: Jason [name], male   DOB: May 04, 1946, 70 y.o.   MRN: [id number]    Patient Name: Jason [name] Date of Encounter: 04/16/2016     Active Problems:   CML (chronic myelocytic leukemia) ([place])   CAD- prior stents 2000 by Dr Elonda Husky in [place]   Tobacco use   HTN (hypertension)   Chest pain with moderate risk of acute coronary syndrome   PAF (paroxysmal atrial fibrillation) (HCC)   Chronic anticoagulation   History of CVA (cerebrovascular accident)   Dizziness   Chest pressure   Sinus bradycardia    SUBJECTIVE  Feels better after PPM. Minimal soreness.  CURRENT MEDS . amLODipine  5 mg Oral Daily  . atorvastatin  10 mg Oral Daily  .  ceFAZolin (ANCEF) IV  2 g Intravenous Q6H  . ferrous sulfate  325 mg Oral BID  . fluticasone  2 spray Each Nare Daily  . imatinib  200 mg Oral Daily  . losartan  100 mg Oral Daily  . metoprolol tartrate  50 mg Oral BID  . nicotine  14 mg Transdermal Daily  . pantoprazole  40 mg Oral Daily  . rivaroxaban  20 mg Oral Q supper    OBJECTIVE  Filed Vitals:   04/15/16 1900 04/15/16 2000 04/15/16 2200 04/16/16 0441  BP: 137/65 116/61 133/69 113/63  Pulse: 62 67 67 60  Temp:    98.2 F (36.8 C)  TempSrc:    Oral  Resp:      Height:      Weight:      SpO2: 97% 96% 96% 95%   No intake or output data in the 24 hours ending 04/16/16 1101 Filed Weights   04/14/16 0856 04/14/16 1526  Weight: 200 lb (90.719 kg) 196 lb 4.8 oz (89.041 kg)    PHYSICAL EXAM  General: Pleasant, NAD. Neuro: Alert and oriented X 3. Moves all extremities spontaneously. Psych: Normal affect. HEENT:  Normal  Neck: Supple without bruits or JVD. Lungs:  Resp regular and unlabored, CTA. Pocket without hematoma. Heart: RRR no s3, s4, or murmurs. Abdomen: Soft, non-tender, non-distended, BS + x 4.  Extremities: No clubbing, cyanosis or edema. DP/PT/Radials 2+ and equal bilaterally.  Accessory Clinical Findings  CBC  Recent Labs  04/14/16 0920  WBC 7.2    NEUTROABS 4.3  HGB 13.7  HCT 40.7  MCV 93.1  PLT 123456   Basic Metabolic Panel  Recent Labs  04/14/16 0920  NA 142  K 3.6  CL 112*  CO2 23  GLUCOSE 100*  BUN 10  CREATININE 1.14  CALCIUM 9.2   Liver Function Tests  Recent Labs  04/14/16 0920  AST 22  ALT 17  ALKPHOS 80  BILITOT 0.7  PROT 7.2  ALBUMIN 4.2   No results for input(s): LIPASE, AMYLASE in the last 72 hours. Cardiac Enzymes  Recent Labs  04/14/16 1648 04/14/16 2313 04/15/16 0431  TROPONINI <0.03 <0.03 <0.03   BNP Invalid input(s): POCBNP D-Dimer  Recent Labs  04/14/16 0920  DDIMER 0.44   Hemoglobin A1C No results for input(s): HGBA1C in the last 72 hours. Fasting Lipid Panel No results for input(s): CHOL, HDL, LDLCALC, TRIG, CHOLHDL, LDLDIRECT in the last 72 hours. Thyroid Function Tests  Recent Labs  04/15/16 1351  TSH 1.858    TELE  nsr with atrial pacing   Radiology/Studies  Dg Chest 2 View  04/16/2016  CLINICAL DATA:  Cardiac arrhythmia, status post pacemaker placement EXAM: CHEST  2 VIEW COMPARISON:  April 14, 2016 FINDINGS: There is no a pacemaker on the left with lead tips attached to the right atrium and right ventricle. No pneumothorax. There is no edema or consolidation. There is mild chronic interstitial thickening. Heart size and pulmonary vascularity are normal. No adenopathy. No bone lesions. IMPRESSION: Pacemaker leads are attached to the right atrium and right ventricle. No pneumothorax. Mild chronic interstitial prominence is likely due to inflammatory type change. No edema or consolidation. Stable cardiac silhouette. Electronically Signed   By: Lowella Grip III M.D.   On: 04/16/2016 07:58   Dg Chest 2 View  04/14/2016  CLINICAL DATA:  Dizziness and chest pressure EXAM: CHEST  2 VIEW COMPARISON:  04/04/2015 FINDINGS: The heart size and mediastinal contours are within normal limits. Both lungs are clear. The visualized skeletal structures are unremarkable.  IMPRESSION: No active cardiopulmonary disease. Electronically Signed   By: Inez Catalina M.D.   On: 04/14/2016 09:55   Ct Head Wo Contrast  04/14/2016  CLINICAL DATA:  Numbness and tingling in both hands EXAM: CT HEAD WITHOUT CONTRAST TECHNIQUE: Contiguous axial images were obtained from the base of the skull through the vertex without intravenous contrast. COMPARISON:  07/04/2012 FINDINGS: Bony calvarium is intact. Very mild atrophic changes are noted. No findings to suggest acute hemorrhage, acute infarction or space-occupying mass lesion are noted. IMPRESSION: No acute intracranial abnormality noted. Electronically Signed   By: Inez Catalina M.D.   On: 04/14/2016 09:50   Nm Myocar Multi W/spect W/wall Motion / Ef  04/15/2016  CLINICAL DATA:  70 year old male with chest pain, atrial fibrillation and history coronary disease with angioplasty and stenting. EXAM: MYOCARDIAL IMAGING WITH SPECT (REST AND PHARMACOLOGIC-STRESS) GATED LEFT VENTRICULAR WALL MOTION STUDY LEFT VENTRICULAR EJECTION FRACTION TECHNIQUE: Standard myocardial SPECT imaging was performed after resting intravenous injection of 10 mCi Tc-37m sestamibi. Subsequently, intravenous infusion of Lexiscan was performed under the supervision of the Cardiology staff. At peak effect of the drug, 30 mCi Tc-29m sestamibi was injected intravenously and standard myocardial SPECT imaging was performed. Quantitative gated imaging was also performed to evaluate left ventricular wall motion, and estimate left ventricular ejection fraction. COMPARISON:  Chest radiograph 04/14/2016, EKG stress test FINDINGS: Perfusion: Decrease counts within the apical, mid, and basilar segment of the inferior wall on rest and stress imaging. Decrease counts within the mid and basilar segment of the lateral wall which are fixed on rest and stress. No evidence of reversible ischemia. Wall Motion: Inferior wall hypokinesia. Fixed Ventricular dilatation. Left Ventricular Ejection  Fraction: 43 % End diastolic volume 123456 ml End systolic volume 65 ml IMPRESSION: 1. Inferior wall and basilar segment of lateral infarction. No reversible ischemia. 2. LEFT ventricular dilatation.  Inferior wall hypokinesia 3. Left ventricular ejection fraction 43% 4. Intermediate-risk stress test findings*. Intermediate risk secondary to low ejection fraction *2012 Appropriate Use Criteria for Coronary Revascularization Focused Update: J Am Coll Cardiol. B5713794. http://content.airportbarriers.com.aspx?articleid=1201161 Electronically Signed   By: Suzy Bouchard M.D.   On: 04/15/2016 17:04    ASSESSMENT AND PLAN  1. Atrial fib - his rate is much better. I would like him to be discharged on a total of 100 mg of metoprolol or Toprol daily, taken in divided doses 2. Coags - he will continue his NOAC 3. PPM - he is s/p DDD PM. His device is working normally. [place] for discharge.  Nidia Grogan,M.D.  04/16/2016 11:01 AM

## 2016-04-16 NOTE — Progress Notes (Signed)
D/C teaching completed including reviewed AVS printout, medications taken today/ still need to take when he is home, new Rx's, instructions regarding new pacemaker, use of sling, limitations per order RUE including use/ ROM of his RUE - all questions answered. Taken via w/c by nursing tech to [employer]	[place] exit to meet his son for drive home.

## 2016-04-20 ENCOUNTER — Other Ambulatory Visit: Payer: Self-pay | Admitting: Family Medicine

## 2016-04-22 ENCOUNTER — Ambulatory Visit (INDEPENDENT_AMBULATORY_CARE_PROVIDER_SITE_OTHER): Payer: Commercial Managed Care - HMO | Admitting: *Deleted

## 2016-04-22 ENCOUNTER — Encounter: Payer: Self-pay | Admitting: Internal Medicine

## 2016-04-22 DIAGNOSIS — Z950 Presence of cardiac pacemaker: Secondary | ICD-10-CM

## 2016-04-22 LAB — CUP PACEART INCLINIC DEVICE CHECK
Brady Statistic AP VP Percent: 0.41 %
Brady Statistic AS VP Percent: 0.93 %
Brady Statistic AS VS Percent: 26.12 %
Brady Statistic RV Percent Paced: 1.35 %
Date Time Interrogation Session: 20170427170757
Implantable Lead Implant Date: 20170420
Implantable Lead Location: 753859
Implantable Lead Location: 753860
Lead Channel Impedance Value: 418 Ohm
Lead Channel Impedance Value: 437 Ohm
Lead Channel Impedance Value: 513 Ohm
Lead Channel Pacing Threshold Amplitude: 0.25 V
Lead Channel Sensing Intrinsic Amplitude: 10.75 mV
Lead Channel Sensing Intrinsic Amplitude: 7.875 mV
Lead Channel Setting Pacing Amplitude: 3.5 V
Lead Channel Setting Sensing Sensitivity: 2 mV
MDC IDC LEAD IMPLANT DT: 20170420
MDC IDC MSMT BATTERY VOLTAGE: 3.1 V
MDC IDC MSMT LEADCHNL RA IMPEDANCE VALUE: 494 Ohm
MDC IDC MSMT LEADCHNL RV PACING THRESHOLD PULSEWIDTH: 0.4 ms
MDC IDC SET LEADCHNL RV PACING AMPLITUDE: 3.5 V
MDC IDC SET LEADCHNL RV PACING PULSEWIDTH: 0.4 ms
MDC IDC STAT BRADY AP VS PERCENT: 72.53 %
MDC IDC STAT BRADY RA PERCENT PACED: 72.95 %

## 2016-05-02 ENCOUNTER — Other Ambulatory Visit: Payer: Self-pay | Admitting: Family Medicine

## 2016-05-06 ENCOUNTER — Ambulatory Visit: Payer: Self-pay | Admitting: Family Medicine

## 2016-05-06 ENCOUNTER — Ambulatory Visit (INDEPENDENT_AMBULATORY_CARE_PROVIDER_SITE_OTHER): Payer: Commercial Managed Care - HMO | Admitting: Physician Assistant

## 2016-05-06 ENCOUNTER — Encounter: Payer: Self-pay | Admitting: Physician Assistant

## 2016-05-06 VITALS — BP 168/92 | HR 68 | Ht 68.0 in | Wt 209.0 lb

## 2016-05-06 DIAGNOSIS — I480 Paroxysmal atrial fibrillation: Secondary | ICD-10-CM

## 2016-05-06 DIAGNOSIS — Z7901 Long term (current) use of anticoagulants: Secondary | ICD-10-CM

## 2016-05-06 DIAGNOSIS — I495 Sick sinus syndrome: Secondary | ICD-10-CM

## 2016-05-06 MED ORDER — AMLODIPINE BESYLATE 5 MG PO TABS: 5.0000 mg | ORAL_TABLET | Freq: Every day | ORAL | Status: DC

## 2016-05-06 NOTE — Patient Instructions (Signed)
Medication Instructions:  START Amlodipine 5 mg daily  Labwork: NONE  Testing/Procedures: NONE  Follow-Up: Dr Lovena Le 3 Months for Pacer Check and Dr Stanford Breed 3 Months  Any Other Special Instructions Will Be Listed Below (If Applicable). Miralax and Colace is ok to take Please make appointment to see Dentist   If you need a refill on your cardiac medications before your next appointment, please call your pharmacy.

## 2016-05-06 NOTE — Progress Notes (Signed)
Cardiology Office Note   Date:  05/06/2016   ID:  Jason [name], DOB 09-02-1946, MRN [id number]  PCP:  Penni Homans, MD  Cardiologist:  Dr Alcide Evener, PA-C   Chief Complaint  Patient presents with  . Follow-up    Nausea, oc nausea, little chest pressure, shortness of breath    History of Present Illness: Jason [name] is a 70 y.o. male with a history of CML, HTN, HLD, GERD, CVA, tob use  D/c 04/21 after admit for chest pain (MV non-ischemic), PAF w/ tachy/brady s/p MDT PPM, now on Xarelto, CHADS2VASC=5  Jason [name] presents for Post hospital follow-up.   He was seen in the device clinic 04/27, had a wound check and device check. He was in atrial fibrillation 16% of the time, had some episodes of rapid ventricular response and a 25 beat episode of nonsustained VT. He was not currently taking metoprolol and was encouraged to be compliant with that.  Since discharge from the hospital, he has not had any significant chest pain. He will get short of breath and occasionally a little lightheaded straining at bowel. He has problems with chronic constipation. This is a little bit better with probiotics, but still significant.   He has not had any significant palpitations. He has not had presyncope.  He has not seen a dentist in a long time and is aware that several teeth in his front lower jaw need to be pulled. He is not having fevers or chills.  Other then when straining of bowel, he has had no new dyspnea on exertion, no lower extremity edema.  He is aware that his blood pressure has been running higher than previous. At one point, he was on Norvasc 5 mg, but this was stopped when he went to the hospital with rapid atrial fibrillation so he could be on Cardizem.   Past Medical History  Diagnosis Date  . CML (chronic myelocytic leukemia) ([place]) 12/16/2011  . History of chicken pox   . Hypertension   . Hyperlipidemia   . History of kidney stones   . Coronary  heart disease   . PAF (paroxysmal atrial fibrillation) ([place])   . IBS (irritable bowel syndrome) 09/20/2013  . Grief reaction 03/18/2014  . Medicare annual wellness visit, subsequent 03/23/2014  . Anemia 05/08/2015  . GERD (gastroesophageal reflux disease)   . CVA (cerebrovascular accident) ([place])   . Symptomatic bradycardia     a. symptomatic bradycardia due to sinus node dysfunction s/p RA and RV Medtronic pacemaker 04/15/16    Past Surgical History  Procedure Laterality Date  . Cholecystectomy  2003  . Coronary angioplasty with stent placement  2000  . Cataract extraction    . Ep implantable device N/A 04/15/2016    Procedure: Pacemaker Implant;  Surgeon: Evans Lance, MD; Medtronic (serial number [id number]) pacemaker; Laterality: Left    Current Outpatient Prescriptions  Medication Sig Dispense Refill  . acetaminophen (TYLENOL) 325 MG tablet Take 650 mg by mouth every 6 (six) hours as needed. For headache.    Marland Kitchen atorvastatin (LIPITOR) 10 MG tablet Take 1 tablet (10 mg total) by mouth daily. 90 tablet 1  . ferrous sulfate 325 (65 FE) MG EC tablet Take 1 tablet (325 mg total) by mouth 2 (two) times daily. 60 tablet 11  . fluticasone (FLONASE) 50 MCG/ACT nasal spray Place 2 sprays into both nostrils daily. 16 g 3  . imatinib ([name]) 100 MG tablet Take 2 tablets (200 mg  total) by mouth daily. Take with meals and large glass of water.Caution:Chemotherapy. 60 tablet 6  . losartan (COZAAR) 100 MG tablet Take 1 tablet (100 mg total) by mouth daily. 90 tablet 1  . meclizine (ANTIVERT) 25 MG tablet Take 1 tablet (25 mg total) by mouth 3 (three) times daily. (Patient taking differently: Take 25 mg by mouth See admin instructions. Pt takes 1 daily at bedtime, but may sometimes take another dose through the day as needed) 90 tablet 0  . metoprolol (LOPRESSOR) 50 MG tablet Take 1 tablet (50 mg total) by mouth 2 (two) times daily. 60 tablet 1  . nitroGLYCERIN (NITROSTAT) 0.4 MG SL tablet Place 1  tablet (0.4 mg total) under the tongue every 5 (five) minutes as needed for chest pain. Please schedule appointment for refills. 25 tablet 0  . omeprazole (PRILOSEC) 40 MG capsule Take 1 capsule (40 mg total) by mouth daily. 90 capsule 1  . ondansetron (ZOFRAN) 4 MG tablet TAKE ONE TABLET BY MOUTH EVERY 8 HOURS AS NEEDED FOR NAUSEA OR VOMITING 20 tablet 0  . Probiotic Product (PROBIOTIC FORMULA PO) Take 1 capsule by mouth daily.     . rivaroxaban (XARELTO) 20 MG TABS tablet TAKE 1 TABLET (20 MG) BY MOUTH DAILY WITH SUPPER. 90 tablet 0   No current facility-administered medications for this visit.    Allergies:   Review of patient's allergies indicates no known allergies.    Social History:  The patient  reports that he quit smoking about 3 weeks ago. His smoking use included Cigarettes. He started smoking about 48 years ago. He has a 10 pack-year smoking history. He has never used smokeless tobacco. He reports that he does not drink alcohol or use illicit drugs.   Family History:  The patient's family history includes Alcohol abuse in his father; Arthritis in his mother; Cancer in his mother; Diabetes in his son; Heart disease in his mother; Hypertension in his father and mother; Other in his mother; Rheumatic fever in his mother. There is no history of Colon cancer, Breast cancer, or Prostate cancer.    ROS:  Please see the history of present illness. All other systems are reviewed and negative.    PHYSICAL EXAM: VS:  BP 168/92 mmHg  Pulse 68  Ht 5\' 8"  (1.727 m)  Wt 209 lb (94.802 kg)  BMI 31.79 kg/m2 , BMI Body mass index is 31.79 kg/(m^2). GEN: Well nourished, well developed, male in no acute distress HEENT: normal for age with very poor dentition Neck: no JVD, no carotid bruit, no masses Cardiac: RRR; soft murmur, no rubs, or gallops Respiratory:  clear to auscultation bilaterally, normal work of breathing; pacemaker site upper left chest is healing well without ecchymosis or  hematoma. No signs of infection GI: soft, nontender, nondistended, + BS MS: no deformity or atrophy; no edema; distal pulses are 2+ in all 4 extremities  Skin: warm and dry, no rash Neuro:  Strength and sensation are intact Psych: euthymic mood, full affect   EKG:  EKG is not ordered today.   Recent Labs: 04/14/2016: ALT 17; BUN 10; Creatinine, Ser 1.14; Hemoglobin 13.7; Platelets 260; Potassium 3.6; Sodium 142 04/15/2016: TSH 1.858    Lipid Panel    Component Value Date/Time   CHOL 131 11/07/2015 1149   TRIG 81.0 11/07/2015 1149   HDL 34.80* 11/07/2015 1149   CHOLHDL 4 11/07/2015 1149   VLDL 16.2 11/07/2015 1149   LDLCALC 80 11/07/2015 1149     Wt Readings  from Last 3 Encounters:  05/06/16 209 lb (94.802 kg)  04/14/16 196 lb 4.8 oz (89.041 kg)  11/27/15 201 lb (91.173 kg)     Other studies Reviewed: Additional studies/ records that were reviewed today include: Hospital records office notes and testing.  ASSESSMENT AND PLAN:  1.  Paroxysmal atrial fibrillation: He is compliant with his metoprolol. He was in atrial fibrillation 16% of the time but was not taking his metoprolol at that time. Importance of compliance with this was reinforced. He is to contact us for palpitations or presyncope.  2. Chronic anticoagulation: He is tolerating Xarelto well. CHADS2VASC=5  3. Hypertension: He is clear that he is taking his metoprolol twice a day. He takes daily medications at supper time but did take the losartan 100 mg last p.m. He was previously on amlodipine and tolerated it well. We will restarted at 5 mg daily and follow his blood pressure.  4. Constipation: Strongly encouraged him to get on a bowel regimen to keep from being constipated. He may have IBS with constipation. He does better with the probiotics, it is okay to keep taking this. He is encouraged to use Mira lax and/or Colace.   Current medicines are reviewed at length with the patient today.  The patient does not  have concerns regarding medicines.  The following changes have been made:  Add amlodipine  Labs/ tests ordered today include:  No orders of the defined types were placed in this encounter.     Disposition:   FU with [name] and Dr. Lovena Le  Signed, Barrett, [address]  05/06/2016 10:25 AM    [place] Phone: [phone]; Fax: [phone]  This note was written with the assistance of speech recognition software. Please excuse any transcriptional errors.

## 2016-05-17 ENCOUNTER — Other Ambulatory Visit: Payer: Self-pay | Admitting: Family Medicine

## 2016-05-25 ENCOUNTER — Ambulatory Visit (INDEPENDENT_AMBULATORY_CARE_PROVIDER_SITE_OTHER): Payer: Commercial Managed Care - HMO | Admitting: Family Medicine

## 2016-05-25 ENCOUNTER — Encounter: Payer: Self-pay | Admitting: Family Medicine

## 2016-05-25 VITALS — BP 132/84 | HR 91 | Temp 98.6°F | Ht 68.0 in | Wt 205.5 lb

## 2016-05-25 DIAGNOSIS — I10 Essential (primary) hypertension: Secondary | ICD-10-CM | POA: Diagnosis not present

## 2016-05-25 DIAGNOSIS — D649 Anemia, unspecified: Secondary | ICD-10-CM | POA: Diagnosis not present

## 2016-05-25 DIAGNOSIS — E782 Mixed hyperlipidemia: Secondary | ICD-10-CM | POA: Diagnosis not present

## 2016-05-25 DIAGNOSIS — K219 Gastro-esophageal reflux disease without esophagitis: Secondary | ICD-10-CM

## 2016-05-25 DIAGNOSIS — I495 Sick sinus syndrome: Secondary | ICD-10-CM

## 2016-05-25 DIAGNOSIS — I480 Paroxysmal atrial fibrillation: Secondary | ICD-10-CM

## 2016-05-25 DIAGNOSIS — Z720 Tobacco use: Secondary | ICD-10-CM

## 2016-05-25 DIAGNOSIS — I2510 Atherosclerotic heart disease of native coronary artery without angina pectoris: Secondary | ICD-10-CM

## 2016-05-25 DIAGNOSIS — R110 Nausea: Secondary | ICD-10-CM

## 2016-05-25 DIAGNOSIS — I2583 Coronary atherosclerosis due to lipid rich plaque: Secondary | ICD-10-CM

## 2016-05-25 DIAGNOSIS — R079 Chest pain, unspecified: Secondary | ICD-10-CM

## 2016-05-25 LAB — CBC
HEMATOCRIT: 39.6 % (ref 39.0–52.0)
HEMOGLOBIN: 13.1 g/dL (ref 13.0–17.0)
MCHC: 33 g/dL (ref 30.0–36.0)
MCV: 91.5 fl (ref 78.0–100.0)
PLATELETS: 227 10*3/uL (ref 150.0–400.0)
RBC: 4.33 Mil/uL (ref 4.22–5.81)
RDW: 14 % (ref 11.5–15.5)
WBC: 10 10*3/uL (ref 4.0–10.5)

## 2016-05-25 LAB — COMPREHENSIVE METABOLIC PANEL
ALBUMIN: 4.3 g/dL (ref 3.5–5.2)
ALT: 11 U/L (ref 0–53)
AST: 16 U/L (ref 0–37)
Alkaline Phosphatase: 84 U/L (ref 39–117)
BUN: 9 mg/dL (ref 6–23)
CHLORIDE: 112 meq/L (ref 96–112)
CO2: 26 meq/L (ref 19–32)
Calcium: 9.3 mg/dL (ref 8.4–10.5)
Creatinine, Ser: 1.07 mg/dL (ref 0.40–1.50)
GFR: 73.01 mL/min (ref >60.00)
Glucose, Bld: 76 mg/dL (ref 70–99)
POTASSIUM: 3.9 meq/L (ref 3.5–5.1)
SODIUM: 143 meq/L (ref 135–145)
Total Bilirubin: 0.4 mg/dL (ref 0.2–1.2)
Total Protein: 7 g/dL (ref 6.0–8.3)

## 2016-05-25 LAB — TSH: TSH: 1.83 u[IU]/mL (ref 0.35–4.50)

## 2016-05-25 LAB — LIPID PANEL
CHOL/HDL RATIO: 4
CHOLESTEROL: 118 mg/dL (ref 0–200)
HDL: 29.2 mg/dL — ABNORMAL LOW (ref >39.00)
LDL CALC: 66 mg/dL (ref 0–99)
NONHDL: 88.96
Triglycerides: 115 mg/dL (ref 0.0–149.0)
VLDL: 23 mg/dL (ref 0.0–40.0)

## 2016-05-25 MED ORDER — RANITIDINE HCL 300 MG PO TABS
300.0000 mg | ORAL_TABLET | Freq: Every day | ORAL | Status: DC
Start: 2016-05-25 — End: 2016-10-02

## 2016-05-25 MED ORDER — ONDANSETRON HCL 4 MG PO TABS: ORAL_TABLET | ORAL | Status: DC

## 2016-05-25 NOTE — Assessment & Plan Note (Signed)
S/p pacer, doing better

## 2016-05-25 NOTE — Assessment & Plan Note (Signed)
Struggling with increased nausea s/p pacemaker placement. Is encouraged to try Ginger caps and may use Zofran as needed

## 2016-05-25 NOTE — Patient Instructions (Signed)
Nausea, Adult °Nausea is the feeling that you have an upset stomach or have to vomit. Nausea by itself is not likely a serious concern, but it may be an early sign of more serious medical problems. As nausea gets worse, it can lead to vomiting. If vomiting develops, there is the risk of dehydration.  °CAUSES  °· Viral infections. °· Food poisoning. °· Medicines. °· Pregnancy. °· Motion sickness. °· Migraine headaches. °· Emotional distress. °· Severe pain from any source. °· Alcohol intoxication. °HOME CARE INSTRUCTIONS °· Get plenty of rest. °· Ask your caregiver about specific rehydration instructions. °· Eat small amounts of food and sip liquids more often. °· Take all medicines as told by your caregiver. °SEEK MEDICAL CARE IF: °· You have not improved after 2 days, or you get worse. °· You have a headache. °SEEK IMMEDIATE MEDICAL CARE IF:  °· You have a fever. °· You faint. °· You keep vomiting or have blood in your vomit. °· You are extremely weak or dehydrated. °· You have dark or bloody stools. °· You have severe chest or abdominal pain. °MAKE SURE YOU: °· Understand these instructions. °· Will watch your condition. °· Will get help right away if you are not doing well or get worse. °  °This information is not intended to replace advice given to you by your health care provider. Make sure you discuss any questions you have with your health care provider. °  °Document Released: 01/20/2005 Document Revised: 01/03/2015 Document Reviewed: 08/25/2011 °Elsevier Interactive Patient Education ©2016 [employer] Inc. ° °

## 2016-05-25 NOTE — Assessment & Plan Note (Signed)
No cigarettes since pacemaker placement. Encouraged ongoing efforts

## 2016-05-25 NOTE — Progress Notes (Signed)
Patient ID: Jason [name], male   DOB: [birth year], 70 y.o.   MRN: [id number]   Subjective:    Patient ID: Jason [name], male    DOB: 06/07/1946, 71 y.o.   MRN: [id number]  Chief Complaint  Patient presents with  . Follow-up    HPI Patient is in today for follow up. Has an pacemaker in place. Feels better although is still struggling with nausea. No vomiting. Is also noting waking up with headaches somewhat frequently. Has persistent fatigue. Denies CP/palp/SOB/congestion/fevers/GI or GU c/o. Taking meds as prescribed  Past Medical History  Diagnosis Date  . CML (chronic myelocytic leukemia) ([place]) 12/16/2011  . History of chicken pox   . Hypertension   . Hyperlipidemia   . History of kidney stones   . Coronary heart disease   . PAF (paroxysmal atrial fibrillation) ([place])   . IBS (irritable bowel syndrome) 09/20/2013  . Grief reaction 03/18/2014  . Medicare annual wellness visit, subsequent 03/23/2014  . Anemia 05/08/2015  . GERD (gastroesophageal reflux disease)   . CVA (cerebrovascular accident) ([place])   . Symptomatic bradycardia     a. symptomatic bradycardia due to sinus node dysfunction s/p RA and RV Medtronic pacemaker 04/15/16    Past Surgical History  Procedure Laterality Date  . Cholecystectomy  2003  . Coronary angioplasty with stent placement  2000  . Cataract extraction    . Ep implantable device N/A 04/15/2016    Procedure: Pacemaker Implant;  Surgeon: Evans Lance, MD; Medtronic (serial number [id number]) pacemaker; Laterality: Left    Family History  Problem Relation Age of Onset  . Arthritis Mother   . Hypertension Mother   . Heart disease Mother     Rheumatic fever  . Rheumatic fever Mother   . Other Mother     brain tumor  . Cancer Mother     leukemia  . Hypertension Father   . Alcohol abuse Father   . Colon cancer Neg Hx   . Breast cancer Neg Hx   . Prostate cancer Neg Hx   . Diabetes Son     Social History   Social History  . Marital Status:  Widowed    Spouse Name: N/A  . Number of Children: 3  . Years of Education: N/A   Occupational History  . Retired     Retired   Social History Main Topics  . Smoking status: Former Smoker -- 0.25 packs/day for 40 years    Types: Cigarettes    Start date: 10/30/1967    Quit date: 04/14/2016  . Smokeless tobacco: Never Used     Comment: 04-16 quit smoking  . Alcohol Use: No     Comment: Rare  . Drug Use: No  . Sexual Activity: No     Comment: wife died in 2023-01-14 married 11 years, grandson livew with patient   Other Topics Concern  . Not on file   Social History Narrative    Outpatient Prescriptions Prior to Visit  Medication Sig Dispense Refill  . acetaminophen (TYLENOL) 325 MG tablet Take 650 mg by mouth every 6 (six) hours as needed. For headache.    Marland Kitchen amLODipine (NORVASC) 5 MG tablet Take 1 tablet (5 mg total) by mouth daily. 30 tablet 9  . atorvastatin (LIPITOR) 10 MG tablet Take 1 tablet (10 mg total) by mouth daily. 90 tablet 1  . ferrous sulfate 325 (65 FE) MG EC tablet Take 1 tablet (325 mg total) by mouth 2 (two) times daily. [place]  tablet 11  . fluticasone (FLONASE) 50 MCG/ACT nasal spray Place 2 sprays into both nostrils daily. 16 g 3  . imatinib ([name]) 100 MG tablet Take 2 tablets (200 mg total) by mouth daily. Take with meals and large glass of water.Caution:Chemotherapy. 60 tablet 6  . losartan (COZAAR) 100 MG tablet Take 1 tablet (100 mg total) by mouth daily. 90 tablet 1  . meclizine (ANTIVERT) 25 MG tablet Take 1 tablet (25 mg total) by mouth 3 (three) times daily. (Patient taking differently: Take 25 mg by mouth See admin instructions. Pt takes 1 daily at bedtime, but may sometimes take another dose through the day as needed) 90 tablet 0  . metoprolol (LOPRESSOR) 50 MG tablet Take 1 tablet (50 mg total) by mouth 2 (two) times daily. 60 tablet 1  . nitroGLYCERIN (NITROSTAT) 0.4 MG SL tablet Place 1 tablet (0.4 mg total) under the tongue every 5 (five) minutes as  needed for chest pain. Please schedule appointment for refills. 25 tablet 0  . omeprazole (PRILOSEC) 40 MG capsule Take 1 capsule (40 mg total) by mouth daily. 90 capsule 1  . Probiotic Product (PROBIOTIC FORMULA PO) Take 1 capsule by mouth daily.     . rivaroxaban (XARELTO) 20 MG TABS tablet TAKE 1 TABLET (20 MG) BY MOUTH DAILY WITH SUPPER. 90 tablet 0  . ondansetron (ZOFRAN) 4 MG tablet TAKE 1 TABLET BY MOUTH EVERY 8 HOURS AS NEEDED FOR NAUSEA OR VOMITING 20 tablet 0   No facility-administered medications prior to visit.    No Known Allergies  Review of Systems  Constitutional: Positive for malaise/fatigue. Negative for fever.  HENT: Negative for congestion.   Eyes: Negative for blurred vision.  Respiratory: Negative for shortness of breath.   Cardiovascular: Positive for palpitations. Negative for chest pain and leg swelling.  Gastrointestinal: Negative for nausea, abdominal pain and blood in stool.  Genitourinary: Negative for dysuria and frequency.  Musculoskeletal: Negative for falls.  Skin: Negative for rash.  Neurological: Negative for dizziness, loss of consciousness and headaches.  Endo/Heme/Allergies: Negative for environmental allergies.  Psychiatric/Behavioral: Negative for depression. The patient is not nervous/anxious.        Objective:    Physical Exam  Constitutional: He is oriented to person, place, and time. He appears well-developed and well-nourished. No distress.  HENT:  Head: Normocephalic and atraumatic.  Nose: Nose normal.  Eyes: Right eye exhibits no discharge. Left eye exhibits no discharge.  Neck: Normal range of motion. Neck supple.  Cardiovascular: Normal rate and regular rhythm.   No murmur heard. Pulmonary/Chest: Effort normal and breath sounds normal.  Abdominal: Soft. Bowel sounds are normal. There is no tenderness.  Musculoskeletal: He exhibits no edema.  Neurological: He is alert and oriented to person, place, and time.  Skin: Skin is warm  and dry.  Psychiatric: He has a normal mood and affect.  Nursing note and vitals reviewed.   BP 132/84 mmHg  Pulse 91  Temp(Src) 98.6 F (37 C) (Oral)  Ht '5\' 8"'$  (1.727 m)  Wt 205 lb 8 oz (93.214 kg)  BMI 31.25 kg/m2  SpO2 96% Wt Readings from Last 3 Encounters:  05/27/16 206 lb (93.441 kg)  05/25/16 205 lb 8 oz (93.214 kg)  05/06/16 209 lb (94.802 kg)     Lab Results  Component Value Date   WBC 8.5 05/27/2016   HGB 14.0 05/27/2016   HCT 41.4 05/27/2016   PLT 218 05/27/2016   GLUCOSE 110 05/27/2016   CHOL 118 05/25/2016  TRIG 115.0 05/25/2016   HDL 29.20* 05/25/2016   LDLCALC 66 05/25/2016   ALT <9 05/27/2016   AST 11 05/27/2016   NA 143 05/27/2016   K 3.5 05/27/2016   CL 112 05/25/2016   CREATININE 1.1 05/27/2016   BUN 7.5 05/27/2016   CO2 24 05/27/2016   TSH 1.83 05/25/2016   INR 1.07 06/27/2014    Lab Results  Component Value Date   TSH 1.83 05/25/2016   Lab Results  Component Value Date   WBC 8.5 05/27/2016   HGB 14.0 05/27/2016   HCT 41.4 05/27/2016   MCV 92 05/27/2016   PLT 218 05/27/2016   Lab Results  Component Value Date   NA 143 05/27/2016   K 3.5 05/27/2016   CHLORIDE 112* 05/27/2016   CO2 24 05/27/2016   GLUCOSE 110 05/27/2016   BUN 7.5 05/27/2016   CREATININE 1.1 05/27/2016   BILITOT 0.40 05/27/2016   ALKPHOS 87 05/27/2016   AST 11 05/27/2016   ALT <9 05/27/2016   PROT 7.1 05/27/2016   ALBUMIN 4.0 05/27/2016   CALCIUM 9.0 05/27/2016   ANIONGAP 7 05/27/2016   EGFR 72* 05/27/2016   GFR 73.01 05/25/2016   Lab Results  Component Value Date   CHOL 118 05/25/2016   Lab Results  Component Value Date   HDL 29.20* 05/25/2016   Lab Results  Component Value Date   LDLCALC 66 05/25/2016   Lab Results  Component Value Date   TRIG 115.0 05/25/2016   Lab Results  Component Value Date   CHOLHDL 4 05/25/2016   No results found for: HGBA1C     Assessment & Plan:   Problem List Items Addressed This Visit    Tobacco use     No cigarettes since pacemaker placement. Encouraged ongoing efforts      Tachy-brady syndrome (HCC) - Primary    S/p pacer, doing better      Relevant Orders   CBC (Completed)   TSH (Completed)   Lipid panel (Completed)   Comprehensive metabolic panel (Completed)   Nausea    Struggling with increased nausea s/p pacemaker placement. Is encouraged to try Ginger caps and may use Zofran as needed      Hyperlipidemia, mixed    Tolerating statin, encouraged heart healthy diet, avoid trans fats, minimize simple carbs and saturated fats. Increase exercise as tolerated      HTN (hypertension) (Chronic)    Well controlled, no changes to meds. Encouraged heart healthy diet such as the DASH diet and exercise as tolerated.       Relevant Orders   CBC (Completed)   TSH (Completed)   Lipid panel (Completed)   Comprehensive metabolic panel (Completed)   Esophageal reflux    Avoid offending foods, start probiotics. Do not eat large meals in late evening and consider raising head of bed. Continue Omeprazole in am and add Ranitidine 300 mg daily      Relevant Medications   ondansetron (ZOFRAN) 4 MG tablet   ranitidine (ZANTAC) 300 MG tablet   Other Relevant Orders   CBC (Completed)   TSH (Completed)   Lipid panel (Completed)   Comprehensive metabolic panel (Completed)   Chest pain with moderate risk of acute coronary syndrome   Relevant Orders   CBC (Completed)   TSH (Completed)   Lipid panel (Completed)   Comprehensive metabolic panel (Completed)   CAD- prior stents 2000 by Dr Chales Abrahams in [place] (Chronic)   Relevant Orders   CBC (Completed)   TSH (Completed)  Lipid panel (Completed)   Comprehensive metabolic panel (Completed)   Atrial fibrillation (HCC)   Relevant Orders   CBC (Completed)   TSH (Completed)   Lipid panel (Completed)   Comprehensive metabolic panel (Completed)   Anemia   Relevant Orders   CBC (Completed)   TSH (Completed)   Lipid panel (Completed)    Comprehensive metabolic panel (Completed)      I am having Mr. Gerling start on ranitidine. I am also having him maintain his Probiotic Product (PROBIOTIC FORMULA PO), acetaminophen, ferrous sulfate, imatinib, atorvastatin, fluticasone, losartan, omeprazole, meclizine, nitroGLYCERIN, rivaroxaban, metoprolol, amLODipine, and ondansetron.  Meds ordered this encounter  Medications  . ondansetron (ZOFRAN) 4 MG tablet    Sig: TAKE 1 TABLET BY MOUTH EVERY 8 HOURS AS NEEDED FOR NAUSEA OR VOMITING    Dispense:  40 tablet    Refill:  1  . ranitidine (ZANTAC) 300 MG tablet    Sig: Take 1 tablet (300 mg total) by mouth at bedtime.    Dispense:  30 tablet    Refill:  3     Penni Homans, MD

## 2016-05-25 NOTE — Progress Notes (Signed)
Pre visit review using our clinic review tool, if applicable. No additional management support is needed unless otherwise documented below in the visit note. 

## 2016-05-25 NOTE — Assessment & Plan Note (Signed)
Avoid offending foods, start probiotics. Do not eat large meals in late evening and consider raising head of bed. Continue Omeprazole in am and add Ranitidine 300 mg daily

## 2016-05-25 NOTE — Assessment & Plan Note (Signed)
Well controlled, no changes to meds. Encouraged heart healthy diet such as the DASH diet and exercise as tolerated.  °

## 2016-05-25 NOTE — Assessment & Plan Note (Signed)
Tolerating statin, encouraged heart healthy diet, avoid trans fats, minimize simple carbs and saturated fats. Increase exercise as tolerated 

## 2016-05-27 ENCOUNTER — Encounter: Payer: Self-pay | Admitting: Hematology & Oncology

## 2016-05-27 ENCOUNTER — Other Ambulatory Visit (HOSPITAL_BASED_OUTPATIENT_CLINIC_OR_DEPARTMENT_OTHER): Payer: Commercial Managed Care - HMO

## 2016-05-27 ENCOUNTER — Ambulatory Visit (HOSPITAL_BASED_OUTPATIENT_CLINIC_OR_DEPARTMENT_OTHER): Payer: Commercial Managed Care - HMO | Admitting: Hematology & Oncology

## 2016-05-27 VITALS — BP 149/75 | HR 80 | Temp 97.8°F | Resp 16 | Ht 68.0 in | Wt 206.0 lb

## 2016-05-27 DIAGNOSIS — C9210 Chronic myeloid leukemia, BCR/ABL-positive, not having achieved remission: Secondary | ICD-10-CM

## 2016-05-27 DIAGNOSIS — C9211 Chronic myeloid leukemia, BCR/ABL-positive, in remission: Secondary | ICD-10-CM

## 2016-05-27 DIAGNOSIS — I495 Sick sinus syndrome: Secondary | ICD-10-CM | POA: Diagnosis not present

## 2016-05-27 LAB — CBC WITH DIFFERENTIAL (CANCER CENTER ONLY)
BASO#: 0.1 10*3/uL (ref 0.0–0.2)
BASO%: 1.2 % (ref 0.0–2.0)
EOS%: 5.3 % (ref 0.0–7.0)
Eosinophils Absolute: 0.5 10*3/uL (ref 0.0–0.5)
HCT: 41.4 % (ref 38.7–49.9)
HGB: 14 g/dL (ref 13.0–17.1)
LYMPH#: 2.1 10*3/uL (ref 0.9–3.3)
LYMPH%: 24.7 % (ref 14.0–48.0)
MCH: 31.1 pg (ref 28.0–33.4)
MCHC: 33.8 g/dL (ref 32.0–35.9)
MCV: 92 fL (ref 82–98)
MONO#: 0.5 10*3/uL (ref 0.1–0.9)
MONO%: 6.1 % (ref 0.0–13.0)
NEUT%: 62.7 % (ref 40.0–80.0)
NEUTROS ABS: 5.3 10*3/uL (ref 1.5–6.5)
PLATELETS: 218 10*3/uL (ref 145–400)
RBC: 4.5 10*6/uL (ref 4.20–5.70)
RDW: 13.6 % (ref 11.1–15.7)
WBC: 8.5 10*3/uL (ref 4.0–10.0)

## 2016-05-27 LAB — COMPREHENSIVE METABOLIC PANEL
AST: 11 U/L (ref 5–34)
Albumin: 4 g/dL (ref 3.5–5.0)
Alkaline Phosphatase: 87 U/L (ref 40–150)
Anion Gap: 7 mEq/L (ref 3–11)
BUN: 7.5 mg/dL (ref 7.0–26.0)
CHLORIDE: 112 meq/L — AB (ref 98–109)
CO2: 24 meq/L (ref 22–29)
CREATININE: 1.1 mg/dL (ref 0.7–1.3)
Calcium: 9 mg/dL (ref 8.4–10.4)
EGFR: 72 mL/min/{1.73_m2} — ABNORMAL LOW (ref >90)
Glucose: 110 mg/dl (ref 70–140)
Potassium: 3.5 mEq/L (ref 3.5–5.1)
Sodium: 143 mEq/L (ref 136–145)
Total Bilirubin: 0.4 mg/dL (ref 0.20–1.20)
Total Protein: 7.1 g/dL (ref 6.4–8.3)

## 2016-05-27 LAB — LACTATE DEHYDROGENASE: LDH: 181 U/L (ref 125–245)

## 2016-05-27 NOTE — Progress Notes (Signed)
Hematology and Oncology Follow Up Visit  Jason [name] LP:[id number] [birth year] 70 y.o. 05/27/2016   Principle Diagnosis:   Chronic phase CML  Paroxysmal atrial fibrillation  Current Therapy:    [name] 200 mg by mouth daily  Xarelto 20 mg by mouth daily     Interim History:  [name] is back for follow-up. He now has a pacemaker in. Is having problems with the tachycardia-bradycardia syndrome. He had the pacemaker placed back in April. He is doing better now. Her if he is still on Xarelto.  His CML has not been a problem. His last BCR/ABL analysis is 0.24%. He's not yet in a major molecular remission but getting close.'  Otherwise, he's had no other problems. His blood sugars have doing okay.  His son died in a car accident about 7 months ago. This still is tough on him.   He's had no bleeding. He's had no bruising. He's had no change in bowel or bladder habits.   He's had no cough. He's had no shortness of breath after having the pacemaker placed.   Overall, his performance status is ECOG 1.   Medications:  Current outpatient prescriptions:  .  acetaminophen (TYLENOL) 325 MG tablet, Take 650 mg by mouth every 6 (six) hours as needed. For headache., Disp: , Rfl:  .  amLODipine (NORVASC) 5 MG tablet, Take 1 tablet (5 mg total) by mouth daily., Disp: 30 tablet, Rfl: 9 .  atorvastatin (LIPITOR) 10 MG tablet, Take 1 tablet (10 mg total) by mouth daily., Disp: 90 tablet, Rfl: 1 .  ferrous sulfate 325 (65 FE) MG EC tablet, Take 1 tablet (325 mg total) by mouth 2 (two) times daily., Disp: 60 tablet, Rfl: 11 .  fluticasone (FLONASE) 50 MCG/ACT nasal spray, Place 2 sprays into both nostrils daily., Disp: 16 g, Rfl: 3 .  imatinib ([name]) 100 MG tablet, Take 2 tablets (200 mg total) by mouth daily. Take with meals and large glass of water.Caution:Chemotherapy., Disp: 60 tablet, Rfl: 6 .  losartan (COZAAR) 100 MG tablet, Take 1 tablet (100 mg total) by mouth daily., Disp: 90 tablet, Rfl:  1 .  meclizine (ANTIVERT) 25 MG tablet, Take 1 tablet (25 mg total) by mouth 3 (three) times daily. (Patient taking differently: Take 25 mg by mouth See admin instructions. Pt takes 1 daily at bedtime, but may sometimes take another dose through the day as needed), Disp: 90 tablet, Rfl: 0 .  metoprolol (LOPRESSOR) 50 MG tablet, Take 1 tablet (50 mg total) by mouth 2 (two) times daily., Disp: 60 tablet, Rfl: 1 .  nitroGLYCERIN (NITROSTAT) 0.4 MG SL tablet, Place 1 tablet (0.4 mg total) under the tongue every 5 (five) minutes as needed for chest pain. Please schedule appointment for refills., Disp: 25 tablet, Rfl: 0 .  omeprazole (PRILOSEC) 40 MG capsule, Take 1 capsule (40 mg total) by mouth daily., Disp: 90 capsule, Rfl: 1 .  ondansetron (ZOFRAN) 4 MG tablet, TAKE 1 TABLET BY MOUTH EVERY 8 HOURS AS NEEDED FOR NAUSEA OR VOMITING, Disp: 40 tablet, Rfl: 1 .  Probiotic Product (PROBIOTIC FORMULA PO), Take 1 capsule by mouth daily. , Disp: , Rfl:  .  ranitidine (ZANTAC) 300 MG tablet, Take 1 tablet (300 mg total) by mouth at bedtime., Disp: 30 tablet, Rfl: 3 .  rivaroxaban (XARELTO) 20 MG TABS tablet, TAKE 1 TABLET (20 MG) BY MOUTH DAILY WITH SUPPER., Disp: 90 tablet, Rfl: 0  Allergies: No Known Allergies  Past Medical History, Surgical history,  Social history, and Family History were reviewed and updated.  Review of Systems: As above  Physical Exam:  height is 5\' 8"  (1.727 m) and weight is 206 lb (93.441 kg). His oral temperature is 97.8 F (36.6 C). His blood pressure is 149/75 and his pulse is 80. His respiration is 16.   Well-developed and well-nourished white woman. Head and neck exam does not her or oral lesions. He has no palpable cervical or supraclavicular lymph nodes. Lungs are clear bilaterally. Cardiac exam regular rate and rhythm with a normal S1 and S2. There are no murmurs rubs or bruits. Abdomen is soft. He has good bowel sounds. There is no palpable abdominal mass. There is no  palpable liver or spleen tip. Back exam shows no tenderness over the spine ribs or hips. Extremities shows no clubbing cyanosis or edema. Skin exam no rashes, ecchymosis or petechia. Neurological exam is nonfocal.  Lab Results  Component Value Date   WBC 8.5 05/27/2016   HGB 14.0 05/27/2016   HCT 41.4 05/27/2016   MCV 92 05/27/2016   PLT 218 05/27/2016     Chemistry      Component Value Date/Time   NA 143 05/25/2016 1222   NA 143 11/27/2015 0942   NA 150* 03/31/2015 1119   K 3.9 05/25/2016 1222   K 4.0 11/27/2015 0942   K 4.2 03/31/2015 1119   CL 112 05/25/2016 1222   CL 109* 03/31/2015 1119   CO2 26 05/25/2016 1222   CO2 24 11/27/2015 0942   CO2 26 03/31/2015 1119   BUN 9 05/25/2016 1222   BUN 10.1 11/27/2015 0942   BUN 10 03/31/2015 1119   CREATININE 1.07 05/25/2016 1222   CREATININE 1.2 11/27/2015 0942   CREATININE 1.3* 03/31/2015 1119      Component Value Date/Time   CALCIUM 9.3 05/25/2016 1222   CALCIUM 9.5 11/27/2015 0942   CALCIUM 9.1 03/31/2015 1119   ALKPHOS 84 05/25/2016 1222   ALKPHOS 89 11/27/2015 0942   ALKPHOS 66 03/31/2015 1119   AST 16 05/25/2016 1222   AST 13 11/27/2015 0942   AST 15 03/31/2015 1119   ALT 11 05/25/2016 1222   ALT <9 11/27/2015 0942   ALT 13 03/31/2015 1119   BILITOT 0.4 05/25/2016 1222   BILITOT 0.44 11/27/2015 0942   BILITOT 0.60 03/31/2015 1119      I looked at his blood smear. His white cells appeared mature. There were no blasts. There were no nucleated red cells. Platelets looked normal.   Impression and Plan: Jason [name] is a 70 year old gentleman with chronic phase CML. He is in remission by his last analysis. He is only on 200 mg a day.  We will continue him on the [place].  [facility] will get him back in 6 months.     Volanda Napoleon, MD 6/1/201710:32 AM

## 2016-06-11 ENCOUNTER — Encounter: Payer: Self-pay | Admitting: Hematology & Oncology

## 2016-07-10 ENCOUNTER — Other Ambulatory Visit: Payer: Self-pay | Admitting: Cardiology

## 2016-07-12 ENCOUNTER — Other Ambulatory Visit: Payer: Self-pay | Admitting: *Deleted

## 2016-07-12 MED ORDER — METOPROLOL TARTRATE 50 MG PO TABS: 50.0000 mg | ORAL_TABLET | Freq: Two times a day (BID) | ORAL | Status: DC

## 2016-07-12 NOTE — Telephone Encounter (Signed)
Rx(s) sent to pharmacy electronically. See notes to pharmacy.

## 2016-07-12 NOTE — Telephone Encounter (Signed)
°*  STAT* If patient is at the pharmacy, call can be transferred to refill team.   1. Which medications need to be refilled? (please list name of each medication and dose if known) Metoprolol 2. Which pharmacy/location (including street and city if local pharmacy) is medication to be sent to?[phone] 3. Do they need a 30 day or 90 day supply? 60 and refills

## 2016-07-14 ENCOUNTER — Other Ambulatory Visit: Payer: Self-pay | Admitting: Family Medicine

## 2016-07-21 ENCOUNTER — Other Ambulatory Visit: Payer: Self-pay | Admitting: Cardiology

## 2016-07-22 ENCOUNTER — Encounter: Payer: Self-pay | Admitting: Internal Medicine

## 2016-07-22 ENCOUNTER — Ambulatory Visit (INDEPENDENT_AMBULATORY_CARE_PROVIDER_SITE_OTHER): Payer: Commercial Managed Care - HMO | Admitting: Internal Medicine

## 2016-07-22 VITALS — BP 148/84 | HR 88 | Ht 67.5 in | Wt 211.4 lb

## 2016-07-22 DIAGNOSIS — Z950 Presence of cardiac pacemaker: Secondary | ICD-10-CM

## 2016-07-22 DIAGNOSIS — I480 Paroxysmal atrial fibrillation: Secondary | ICD-10-CM

## 2016-07-22 DIAGNOSIS — I495 Sick sinus syndrome: Secondary | ICD-10-CM | POA: Diagnosis not present

## 2016-07-22 NOTE — Progress Notes (Signed)
HPI Jason [name] returns today for ongoing PM followup and evaluation. He is a 70 yo man with a h/o chronic anemia, PAF, sinus node dysfunction, who underwent PPM insertion approx 3 months ago. In the interim, he has done well with no chest pain or sob. No palpitations.  No Known Allergies   Current Outpatient Prescriptions  Medication Sig Dispense Refill  . acetaminophen (TYLENOL) 325 MG tablet Take 650 mg by mouth every 6 (six) hours as needed. For headache.    Marland Kitchen amLODipine (NORVASC) 5 MG tablet Take 1 tablet (5 mg total) by mouth daily. 90 tablet 1  . atorvastatin (LIPITOR) 10 MG tablet Take 1 tablet (10 mg total) by mouth daily. 90 tablet 1  . ferrous sulfate 325 (65 FE) MG EC tablet Take 1 tablet (325 mg total) by mouth 2 (two) times daily. 60 tablet 11  . fluticasone (FLONASE) 50 MCG/ACT nasal spray Place 2 sprays into both nostrils daily. 16 g 3  . imatinib ([name]) 100 MG tablet Take 2 tablets (200 mg total) by mouth daily. Take with meals and large glass of water.Caution:Chemotherapy. 60 tablet 6  . losartan (COZAAR) 100 MG tablet Take 1 tablet (100 mg total) by mouth daily. 90 tablet 1  . meclizine (ANTIVERT) 25 MG tablet Take 25 mg by mouth 3 (three) times daily.    . metoprolol (LOPRESSOR) 50 MG tablet Take 1 tablet (50 mg total) by mouth 2 (two) times daily. 60 tablet 10  . nitroGLYCERIN (NITROSTAT) 0.4 MG SL tablet Place 1 tablet (0.4 mg total) under the tongue every 5 (five) minutes as needed for chest pain. Please schedule appointment for refills. 25 tablet 0  . ondansetron (ZOFRAN) 4 MG tablet TAKE 1 TABLET BY MOUTH EVERY 8 HOURS AS NEEDED FOR NAUSEA OR VOMITING 40 tablet 1  . Probiotic Product (PROBIOTIC FORMULA PO) Take 1 capsule by mouth daily.     . ranitidine (ZANTAC) 300 MG tablet Take 1 tablet (300 mg total) by mouth at bedtime. 30 tablet 3  . rivaroxaban (XARELTO) 20 MG TABS tablet Take 1 tablet (20 mg total) by mouth daily with supper. TAKE 1 TABLET (20 MG) BY  MOUTH DAILY WITH SUPPER. 90 tablet 0   No current facility-administered medications for this visit.      Past Medical History:  Diagnosis Date  . Anemia 05/08/2015  . CML (chronic myelocytic leukemia) ([place]) 12/16/2011  . Coronary heart disease   . CVA (cerebrovascular accident) ([place])   . GERD (gastroesophageal reflux disease)   . Grief reaction 03/18/2014  . History of chicken pox   . History of kidney stones   . Hyperlipidemia   . Hypertension   . IBS (irritable bowel syndrome) 09/20/2013  . Medicare annual wellness visit, subsequent 03/23/2014  . PAF (paroxysmal atrial fibrillation) ([place])   . Symptomatic bradycardia    a. symptomatic bradycardia due to sinus node dysfunction s/p RA and RV Medtronic pacemaker 04/15/16    ROS:   All systems reviewed and negative except as noted in the HPI.   Past Surgical History:  Procedure Laterality Date  . CATARACT EXTRACTION    . CHOLECYSTECTOMY  2003  . CORONARY ANGIOPLASTY WITH STENT PLACEMENT  2000  . EP IMPLANTABLE DEVICE N/A 04/15/2016   Procedure: Pacemaker Implant;  Surgeon: Evans Lance, MD; Medtronic (serial number [id number]) pacemaker; Laterality: Left     Family History  Problem Relation Age of Onset  . Arthritis Mother   . Hypertension Mother   .  Heart disease Mother     Rheumatic fever  . Rheumatic fever Mother   . Other Mother     brain tumor  . Cancer Mother     leukemia  . Hypertension Father   . Alcohol abuse Father   . Colon cancer Neg Hx   . Breast cancer Neg Hx   . Prostate cancer Neg Hx   . Diabetes Son      Social History   Social History  . Marital status: Widowed    Spouse name: N/A  . Number of children: 3  . Years of education: N/A   Occupational History  . Retired     Retired   Social History Main Topics  . Smoking status: Former Smoker    Packs/day: 0.25    Years: 40.00    Types: Cigarettes    Start date: 10/30/1967    Quit date: 04/14/2016  . Smokeless tobacco: Never Used      Comment: 04-16 quit smoking  . Alcohol use No     Comment: Rare  . Drug use: No  . Sexual activity: No     Comment: wife died in 2023/02/03 married 85 years, grandson livew with patient   Other Topics Concern  . Not on file   Social History Narrative  . No narrative on file     BP (!) 148/84   Pulse 88   Ht 5' 7.5" (1.715 m)   Wt 211 lb 6.4 oz (95.9 kg)   BMI 32.62 kg/m   Physical Exam:  Well appearing 70 yo man, NAD HEENT: Unremarkable Neck:  6 cm JVD, no thyromegally Lymphatics:  No adenopathy Back:  No CVA tenderness Lungs:  Clear with no wheezes HEART:  Regular rate rhythm, no murmurs, no rubs, no clicks Abd:  soft, positive bowel sounds, no organomegally, no rebound, no guarding Ext:  2 plus pulses, no edema, no cyanosis, no clubbing Skin:  No rashes no nodules Neuro:  CN II through XII intact, motor grossly intact  EKG - nsr with atrial pacing  DEVICE  Normal device function.  See PaceArt for details.   Assess/Plan: 1. Sinus node dysfunction - he is asymptomatic s/p PPM insertion 2. PAF - he is asymptomatic and is tolerating Xarelto. No change in meds. 3. HTN - his blood pressure is a little high today. He is encouraged to maintain a low sodium diet.  Mikle Bosworth.D.

## 2016-07-22 NOTE — Patient Instructions (Signed)
Medication Instructions:  Your physician recommends that you continue on your current medications as directed. Please refer to the Current Medication list given to you today.   Labwork: None ordered   Testing/Procedures: None ordered   Follow-Up: Your physician wants you to follow-up in: 12 months with Dr Knox Saliva will receive a reminder letter in the mail two months in advance. If you don't receive a letter, please call our office to schedule the follow-up appointment.   Remote monitoring is used to monitor your Pacemaker from home. This monitoring reduces the number of office visits required to check your device to one time per year. It allows [place] to keep an eye on the functioning of your device to ensure it is working properly. You are scheduled for a device check from home on 10/21/16. You may send your transmission at any time that day. If you have a wireless device, the transmission will be sent automatically. After your physician reviews your transmission, you will receive a postcard with your next transmission date.    Any Other Special Instructions Will Be Listed Below (If Applicable).     If you need a refill on your cardiac medications before your next appointment, please call your pharmacy.

## 2016-07-23 LAB — CUP PACEART INCLINIC DEVICE CHECK
Battery Voltage: 3.04 V
Brady Statistic RA Percent Paced: 77.25 %
Brady Statistic RV Percent Paced: 2.91 %
Date Time Interrogation Session: 20170727124950
Implantable Lead Implant Date: 20170420
Implantable Lead Location: 753860
Implantable Lead Model: 5076
Lead Channel Impedance Value: 437 Ohm
Lead Channel Impedance Value: 456 Ohm
Lead Channel Pacing Threshold Amplitude: 0.75 V
Lead Channel Pacing Threshold Pulse Width: 0.4 ms
Lead Channel Pacing Threshold Pulse Width: 0.4 ms
Lead Channel Sensing Intrinsic Amplitude: 3.25 mV
Lead Channel Sensing Intrinsic Amplitude: 8.5 mV
Lead Channel Setting Pacing Pulse Width: 0.4 ms
Lead Channel Setting Sensing Sensitivity: 2 mV
MDC IDC LEAD IMPLANT DT: 20170420
MDC IDC LEAD LOCATION: 753859
MDC IDC MSMT BATTERY REMAINING LONGEVITY: 128 mo
MDC IDC MSMT LEADCHNL RA IMPEDANCE VALUE: 551 Ohm
MDC IDC MSMT LEADCHNL RA SENSING INTR AMPL: 3.5 mV
MDC IDC MSMT LEADCHNL RV IMPEDANCE VALUE: 418 Ohm
MDC IDC MSMT LEADCHNL RV PACING THRESHOLD AMPLITUDE: 0.5 V
MDC IDC MSMT LEADCHNL RV SENSING INTR AMPL: 7.75 mV
MDC IDC SET LEADCHNL RA PACING AMPLITUDE: 2 V
MDC IDC SET LEADCHNL RV PACING AMPLITUDE: 2.5 V
MDC IDC STAT BRADY AP VP PERCENT: 0.76 %
MDC IDC STAT BRADY AP VS PERCENT: 76.49 %
MDC IDC STAT BRADY AS VP PERCENT: 2.15 %
MDC IDC STAT BRADY AS VS PERCENT: 20.6 %

## 2016-07-28 ENCOUNTER — Other Ambulatory Visit: Payer: Self-pay | Admitting: Family Medicine

## 2016-07-28 NOTE — Telephone Encounter (Signed)
Rx request Denied, one duplicate from [id number] and one not px by provider/SLS 08/02

## 2016-07-28 NOTE — Telephone Encounter (Signed)
Rx request to pharmacy/SLS  

## 2016-08-06 ENCOUNTER — Encounter: Payer: Self-pay | Admitting: Family Medicine

## 2016-08-06 ENCOUNTER — Ambulatory Visit (INDEPENDENT_AMBULATORY_CARE_PROVIDER_SITE_OTHER): Payer: Commercial Managed Care - HMO | Admitting: Family Medicine

## 2016-08-06 DIAGNOSIS — I10 Essential (primary) hypertension: Secondary | ICD-10-CM | POA: Diagnosis not present

## 2016-08-06 DIAGNOSIS — E782 Mixed hyperlipidemia: Secondary | ICD-10-CM

## 2016-08-06 DIAGNOSIS — C9210 Chronic myeloid leukemia, BCR/ABL-positive, not having achieved remission: Secondary | ICD-10-CM | POA: Diagnosis not present

## 2016-08-06 DIAGNOSIS — K219 Gastro-esophageal reflux disease without esophagitis: Secondary | ICD-10-CM

## 2016-08-06 DIAGNOSIS — D649 Anemia, unspecified: Secondary | ICD-10-CM

## 2016-08-06 DIAGNOSIS — I480 Paroxysmal atrial fibrillation: Secondary | ICD-10-CM

## 2016-08-06 DIAGNOSIS — Z720 Tobacco use: Secondary | ICD-10-CM | POA: Diagnosis not present

## 2016-08-06 DIAGNOSIS — R110 Nausea: Secondary | ICD-10-CM

## 2016-08-06 MED ORDER — OMEPRAZOLE 40 MG PO CPDR: 40.0000 mg | DELAYED_RELEASE_CAPSULE | Freq: Every day | ORAL | 3 refills | Status: DC

## 2016-08-06 NOTE — Assessment & Plan Note (Addendum)
Consider Ginger caps or ginger tea prn, report worsening sympotms

## 2016-08-06 NOTE — Assessment & Plan Note (Signed)
Tolerating statin, encouraged heart healthy diet, avoid trans fats, minimize simple carbs and saturated fats. Increase exercise as tolerated 

## 2016-08-06 NOTE — Assessment & Plan Note (Signed)
Avoid offending foods, start probiotics. Do not eat large meals in late evening and consider raising head of bed. Tolerating Omeprazole 40 mg in am and Ranitidine qhs.

## 2016-08-06 NOTE — Assessment & Plan Note (Signed)
Has quit smoking this year. Encouraged ongoing efforts.

## 2016-08-06 NOTE — Progress Notes (Signed)
Patient ID: Jason [name], male   DOB: 1946-04-02, 70 y.o.   MRN: [id number]   Subjective:    Patient ID: Jason [name], male    DOB: [birth year], 71 y.o.   MRN: [id number]  Chief Complaint  Patient presents with  . Follow-up    HPI Patient is in today for follow up. No recent illness or hospitalization. Heartburn well controlled on Omeprazole and Ranitidine daily. Denies CP/palp/SOB/HA/congestion/fevers/GI or GU c/o. Taking meds as prescribed. Feels well, bwels moving well.  Past Medical History:  Diagnosis Date  . Anemia 05/08/2015  . CML (chronic myelocytic leukemia) ([place]) 12/16/2011  . Coronary heart disease   . CVA (cerebrovascular accident) ([place])   . GERD (gastroesophageal reflux disease)   . Grief reaction 03/18/2014  . History of chicken pox   . History of kidney stones   . Hyperlipidemia   . Hypertension   . IBS (irritable bowel syndrome) 09/20/2013  . Medicare annual wellness visit, subsequent 03/23/2014  . PAF (paroxysmal atrial fibrillation) ([place])   . Symptomatic bradycardia    a. symptomatic bradycardia due to sinus node dysfunction s/p RA and RV Medtronic pacemaker 04/15/16    Past Surgical History:  Procedure Laterality Date  . CATARACT EXTRACTION    . CHOLECYSTECTOMY  2003  . CORONARY ANGIOPLASTY WITH STENT PLACEMENT  2000  . EP IMPLANTABLE DEVICE N/A 04/15/2016   Procedure: Pacemaker Implant;  Surgeon: Evans Lance, MD; Medtronic (serial number [id number]) pacemaker; Laterality: Left    Family History  Problem Relation Age of Onset  . Arthritis Mother   . Hypertension Mother   . Heart disease Mother     Rheumatic fever  . Rheumatic fever Mother   . Other Mother     brain tumor  . Cancer Mother     leukemia  . Hypertension Father   . Alcohol abuse Father   . Diabetes Son   . Colon cancer Neg Hx   . Breast cancer Neg Hx   . Prostate cancer Neg Hx     Social History   Social History  . Marital status: Widowed    Spouse name: N/A  . Number of  children: 3  . Years of education: N/A   Occupational History  . Retired     Retired   Social History Main Topics  . Smoking status: Former Smoker    Packs/day: 0.25    Years: 40.00    Types: Cigarettes    Start date: 10/30/1967    Quit date: 04/14/2016  . Smokeless tobacco: Never Used     Comment: 04-16 quit smoking  . Alcohol use No     Comment: Rare  . Drug use: No  . Sexual activity: No     Comment: wife died in 2023/01/12 married 31 years, grandson livew with patient   Other Topics Concern  . Not on file   Social History Narrative  . No narrative on file    Outpatient Medications Prior to Visit  Medication Sig Dispense Refill  . acetaminophen (TYLENOL) 325 MG tablet Take 650 mg by mouth every 6 (six) hours as needed. For headache.    Marland Kitchen amLODipine (NORVASC) 5 MG tablet Take 1 tablet (5 mg total) by mouth daily. 90 tablet 1  . atorvastatin (LIPITOR) 10 MG tablet TAKE 1 TABLET EVERY DAY 90 tablet 0  . ferrous sulfate 325 (65 FE) MG EC tablet Take 1 tablet (325 mg total) by mouth 2 (two) times daily. 60 tablet 11  . fluticasone (  FLONASE) 50 MCG/ACT nasal spray Place 2 sprays into both nostrils daily. 16 g 3  . imatinib ([name]) 100 MG tablet Take 2 tablets (200 mg total) by mouth daily. Take with meals and large glass of water.Caution:Chemotherapy. 60 tablet 6  . losartan (COZAAR) 100 MG tablet TAKE 1 TABLET EVERY DAY 90 tablet 0  . meclizine (ANTIVERT) 25 MG tablet Take 25 mg by mouth 3 (three) times daily.    . metoprolol (LOPRESSOR) 50 MG tablet Take 1 tablet (50 mg total) by mouth 2 (two) times daily. 60 tablet 10  . nitroGLYCERIN (NITROSTAT) 0.4 MG SL tablet Place 1 tablet (0.4 mg total) under the tongue every 5 (five) minutes as needed for chest pain. Please schedule appointment for refills. 25 tablet 0  . ondansetron (ZOFRAN) 4 MG tablet TAKE 1 TABLET BY MOUTH EVERY 8 HOURS AS NEEDED FOR NAUSEA OR VOMITING 40 tablet 1  . Probiotic Product (PROBIOTIC FORMULA PO) Take 1  capsule by mouth daily.     . ranitidine (ZANTAC) 300 MG tablet Take 1 tablet (300 mg total) by mouth at bedtime. 30 tablet 3  . rivaroxaban (XARELTO) 20 MG TABS tablet Take 1 tablet (20 mg total) by mouth daily with supper. TAKE 1 TABLET (20 MG) BY MOUTH DAILY WITH SUPPER. 90 tablet 0   No facility-administered medications prior to visit.     No Known Allergies  Review of Systems  Constitutional: Negative for fever and malaise/fatigue.  HENT: Negative for congestion.   Eyes: Negative for blurred vision.  Respiratory: Negative for shortness of breath.   Cardiovascular: Negative for chest pain, palpitations and leg swelling.  Gastrointestinal: Negative for abdominal pain, blood in stool and nausea.  Genitourinary: Negative for dysuria and frequency.  Musculoskeletal: Negative for falls.  Skin: Negative for rash.  Neurological: Negative for dizziness, loss of consciousness and headaches.  Endo/Heme/Allergies: Negative for environmental allergies.  Psychiatric/Behavioral: Negative for depression. The patient is not nervous/anxious.        Objective:    Physical Exam  Constitutional: He is oriented to person, place, and time. He appears well-developed and well-nourished. No distress.  HENT:  Head: Normocephalic and atraumatic.  Nose: Nose normal.  Eyes: Right eye exhibits no discharge. Left eye exhibits no discharge.  Neck: Normal range of motion. Neck supple.  Cardiovascular: Normal rate and regular rhythm.   No murmur heard. Pulmonary/Chest: Effort normal and breath sounds normal.  Abdominal: Soft. Bowel sounds are normal. There is no tenderness.  Musculoskeletal: He exhibits no edema.  Neurological: He is alert and oriented to person, place, and time.  Skin: Skin is warm and dry.  Psychiatric: He has a normal mood and affect.  Nursing note and vitals reviewed.   BP 122/76 (BP Location: Left Arm, Patient Position: Sitting, Cuff Size: Normal)   Pulse 96   Temp 98.2 F  (36.8 C) (Oral)   Ht _0  (1.727 m)   Wt 210 lb 8 oz (95.5 kg)   SpO2 95%   BMI 32.01 kg/m  Wt Readings from Last 3 Encounters:  08/06/16 210 lb 8 oz (95.5 kg)  07/22/16 211 lb 6.4 oz (95.9 kg)  05/27/16 206 lb (93.4 kg)     Lab Results  Component Value Date   WBC 8.5 05/27/2016   HGB 14.0 05/27/2016   HCT 41.4 05/27/2016   PLT 218 05/27/2016   GLUCOSE 110 05/27/2016   CHOL 118 05/25/2016   TRIG 115.0 05/25/2016   HDL 29.20 (L) 05/25/2016   [place]  66 05/25/2016   ALT <9 05/27/2016   AST 11 05/27/2016   NA 143 05/27/2016   K 3.5 05/27/2016   CL 112 05/25/2016   CREATININE 1.1 05/27/2016   BUN 7.5 05/27/2016   CO2 24 05/27/2016   TSH 1.83 05/25/2016   INR 1.07 06/27/2014    Lab Results  Component Value Date   TSH 1.83 05/25/2016   Lab Results  Component Value Date   WBC 8.5 05/27/2016   HGB 14.0 05/27/2016   HCT 41.4 05/27/2016   MCV 92 05/27/2016   PLT 218 05/27/2016   Lab Results  Component Value Date   NA 143 05/27/2016   K 3.5 05/27/2016   CHLORIDE 112 (H) 05/27/2016   CO2 24 05/27/2016   GLUCOSE 110 05/27/2016   BUN 7.5 05/27/2016   CREATININE 1.1 05/27/2016   BILITOT 0.40 05/27/2016   ALKPHOS 87 05/27/2016   AST 11 05/27/2016   ALT <9 05/27/2016   PROT 7.1 05/27/2016   ALBUMIN 4.0 05/27/2016   CALCIUM 9.0 05/27/2016   ANIONGAP 7 05/27/2016   EGFR 72 (L) 05/27/2016   GFR 73.01 05/25/2016   Lab Results  Component Value Date   CHOL 118 05/25/2016   Lab Results  Component Value Date   HDL 29.20 (L) 05/25/2016   Lab Results  Component Value Date   LDLCALC 66 05/25/2016   Lab Results  Component Value Date   TRIG 115.0 05/25/2016   Lab Results  Component Value Date   CHOLHDL 4 05/25/2016   No results found for: HGBA1C     Assessment & Plan:   Problem List Items Addressed This Visit    CML (chronic myelocytic leukemia) ([place]) (Chronic)    Following with [facility] doing well      HTN (hypertension) (Chronic)    Well controlled,  no changes to meds. Encouraged heart healthy diet such as the DASH diet and exercise as tolerated.       Hyperlipidemia, mixed    Tolerating statin, encouraged heart healthy diet, avoid trans fats, minimize simple carbs and saturated fats. Increase exercise as tolerated      Tobacco use    Has quit smoking this year. Encouraged ongoing efforts.      Nausea    Consider Ginger caps or ginger tea prn, report worsening sympotms      Atrial fibrillation (HCC)    Pacemaker in place, follows with Dr Lovena Le, tolerating Xarelto      Esophageal reflux    Avoid offending foods, start probiotics. Do not eat large meals in late evening and consider raising head of bed. Tolerating Omeprazole 40 mg in am and Ranitidine qhs.       Relevant Medications   omeprazole (PRILOSEC) 40 MG capsule   Anemia    Improved with supplemental iron, continue supplements       Other Visit Diagnoses   None.     I am having Mr. Hagan maintain his Probiotic Product (PROBIOTIC FORMULA PO), acetaminophen, ferrous sulfate, imatinib, fluticasone, nitroGLYCERIN, ondansetron, ranitidine, metoprolol, rivaroxaban, amLODipine, meclizine, atorvastatin, losartan, and omeprazole.  Meds ordered this encounter  Medications  . omeprazole (PRILOSEC) 40 MG capsule    Sig: Take 1 capsule (40 mg total) by mouth daily.    Dispense:  90 capsule    Refill:  3     Penni Homans, MD

## 2016-08-06 NOTE — Assessment & Plan Note (Addendum)
Improved with supplemental iron, continue supplements

## 2016-08-06 NOTE — Progress Notes (Signed)
Pre visit review using our clinic review tool, if applicable. No additional management support is needed unless otherwise documented below in the visit note. 

## 2016-08-06 NOTE — Assessment & Plan Note (Signed)
Well controlled, no changes to meds. Encouraged heart healthy diet such as the DASH diet and exercise as tolerated.  °

## 2016-08-06 NOTE — Assessment & Plan Note (Signed)
Following with [facility] doing well

## 2016-08-06 NOTE — Assessment & Plan Note (Signed)
Pacemaker in place, follows with Dr Lovena Le, tolerating Xarelto

## 2016-08-06 NOTE — Patient Instructions (Signed)
Food Choices for Gastroesophageal Reflux Disease, Adult When you have gastroesophageal reflux disease (GERD), the foods you eat and your eating habits are very important. Choosing the right foods can help ease the discomfort of GERD. WHAT GENERAL GUIDELINES DO I NEED TO FOLLOW?  Choose fruits, vegetables, whole grains, low-fat dairy products, and low-fat meat, fish, and poultry.  Limit fats such as oils, salad dressings, butter, nuts, and avocado.  Keep a food diary to identify foods that cause symptoms.  Avoid foods that cause reflux. These may be different for different people.  Eat frequent small meals instead of three large meals each day.  Eat your meals slowly, in a relaxed setting.  Limit fried foods.  Cook foods using methods other than frying.  Avoid drinking alcohol.  Avoid drinking large amounts of liquids with your meals.  Avoid bending over or lying down until 2-3 hours after eating. WHAT FOODS ARE NOT RECOMMENDED? The following are some foods and drinks that may worsen your symptoms: Vegetables Tomatoes. Tomato juice. Tomato and spaghetti sauce. Chili peppers. Onion and garlic. Horseradish. Fruits Oranges, grapefruit, and lemon (fruit and juice). Meats High-fat meats, fish, and poultry. This includes hot dogs, ribs, ham, sausage, salami, and bacon. Dairy Whole milk and chocolate milk. Sour cream. Cream. Butter. Ice cream. Cream cheese.  Beverages Coffee and tea, with or without caffeine. Carbonated beverages or energy drinks. Condiments Hot sauce. Barbecue sauce.  Sweets/Desserts Chocolate and cocoa. Donuts. Peppermint and spearmint. Fats and Oils High-fat foods, including French fries and potato chips. Other Vinegar. Strong spices, such as black pepper, white pepper, red pepper, cayenne, curry powder, cloves, ginger, and chili powder. The items listed above may not be a complete list of foods and beverages to avoid. Contact your dietitian for more  information.   This information is not intended to replace advice given to you by your health care provider. Make sure you discuss any questions you have with your health care provider.   Document Released: 12/13/2005 Document Revised: 01/03/2015 Document Reviewed: 10/17/2013 Elsevier Interactive Patient Education 2016 Elsevier Inc.  

## 2016-08-11 ENCOUNTER — Telehealth: Payer: Self-pay | Admitting: *Deleted

## 2016-08-11 DIAGNOSIS — I714 Abdominal aortic aneurysm, without rupture, unspecified: Secondary | ICD-10-CM

## 2016-08-11 NOTE — Telephone Encounter (Signed)
Spoke with pt, it is time for him to have an ultrasound to f/u on his AAA. Order placed and number given to the [facility] for pt to call to schedule.

## 2016-08-17 ENCOUNTER — Encounter: Payer: Self-pay | Admitting: *Deleted

## 2016-08-17 NOTE — Progress Notes (Signed)
This encounter was created in error - please disregard.

## 2016-08-18 ENCOUNTER — Other Ambulatory Visit: Payer: Self-pay | Admitting: Cardiology

## 2016-08-18 ENCOUNTER — Other Ambulatory Visit: Payer: Self-pay | Admitting: *Deleted

## 2016-08-18 DIAGNOSIS — I714 Abdominal aortic aneurysm, without rupture, unspecified: Secondary | ICD-10-CM

## 2016-08-19 ENCOUNTER — Ambulatory Visit (HOSPITAL_BASED_OUTPATIENT_CLINIC_OR_DEPARTMENT_OTHER)
Admission: RE | Admit: 2016-08-19 | Discharge: 2016-08-19 | Disposition: A | Discharge status: Home / Self Care | Payer: Commercial Managed Care - HMO | Source: Ambulatory Visit | Attending: Cardiology | Admitting: Cardiology

## 2016-08-19 DIAGNOSIS — I714 Abdominal aortic aneurysm, without rupture, unspecified: Secondary | ICD-10-CM

## 2016-08-23 ENCOUNTER — Ambulatory Visit: Payer: Commercial Managed Care - HMO | Admitting: Cardiology

## 2016-08-23 ENCOUNTER — Other Ambulatory Visit: Payer: Self-pay | Admitting: *Deleted

## 2016-08-23 DIAGNOSIS — I714 Abdominal aortic aneurysm, without rupture, unspecified: Secondary | ICD-10-CM

## 2016-08-23 NOTE — Progress Notes (Signed)
HPI: FU coronary artery disease and atrial fibrillation. Patient had stents placed at [facility] in 2000. Records are not available. Patient seen preoperatively at [facility] on September 01 2012 and electrocardiogram showed atrial fibrillation. TSH in September of 2013 was 1.612. Patient has been maintained on anticoagulation. Monitor April 2016 showed paroxysmal atrial fibrillation and Toprol increased. Echocardiogram repeated April 2017 and showed an ejection fraction of 40-45%, mild to moderate aortic insufficiency and mildly dilated aortic root. Abdominal ultrasound August 2017 showed abdominal aortic aneurysm measuring 3.9 cm. Nuclear study April 2017 showed ejection fraction 43%, inferior and lateral infarct but no ischemia. Has had pacemaker placed secondary to tachybradycardia syndrome.Since last seen,the patient denies any dyspnea on exertion, orthopnea, PND, pedal edema, palpitations, syncope or chest pain.   Current Outpatient Prescriptions  Medication Sig Dispense Refill  . acetaminophen (TYLENOL) 325 MG tablet Take 650 mg by mouth every 6 (six) hours as needed. For headache.    Marland Kitchen amLODipine (NORVASC) 5 MG tablet Take 1 tablet (5 mg total) by mouth daily. 90 tablet 1  . atorvastatin (LIPITOR) 10 MG tablet TAKE 1 TABLET EVERY DAY 90 tablet 0  . ferrous sulfate 325 (65 FE) MG EC tablet Take 1 tablet (325 mg total) by mouth 2 (two) times daily. 60 tablet 11  . fluticasone (FLONASE) 50 MCG/ACT nasal spray Place 2 sprays into both nostrils daily. 16 g 3  . imatinib ([name]) 100 MG tablet Take 2 tablets (200 mg total) by mouth daily. Take with meals and large glass of water.Caution:Chemotherapy. 60 tablet 6  . losartan (COZAAR) 100 MG tablet TAKE 1 TABLET EVERY DAY 90 tablet 0  . meclizine (ANTIVERT) 25 MG tablet Take 25 mg by mouth 3 (three) times daily.    . metoprolol (LOPRESSOR) 50 MG tablet Take 1 tablet (50 mg total) by mouth 2 (two) times daily. 60 tablet 10  .  nitroGLYCERIN (NITROSTAT) 0.4 MG SL tablet Place 1 tablet (0.4 mg total) under the tongue every 5 (five) minutes as needed for chest pain. Please schedule appointment for refills. 25 tablet 0  . omeprazole (PRILOSEC) 40 MG capsule Take 1 capsule (40 mg total) by mouth daily. 90 capsule 3  . ondansetron (ZOFRAN) 4 MG tablet TAKE 1 TABLET BY MOUTH EVERY 8 HOURS AS NEEDED FOR NAUSEA OR VOMITING 40 tablet 1  . Probiotic Product (PROBIOTIC FORMULA PO) Take 1 capsule by mouth daily.     . ranitidine (ZANTAC) 300 MG tablet Take 1 tablet (300 mg total) by mouth at bedtime. 30 tablet 3  . rivaroxaban (XARELTO) 20 MG TABS tablet Take 1 tablet (20 mg total) by mouth daily with supper. TAKE 1 TABLET (20 MG) BY MOUTH DAILY WITH SUPPER. 90 tablet 0   No current facility-administered medications for this visit.      Past Medical History:  Diagnosis Date  . Anemia 05/08/2015  . CML (chronic myelocytic leukemia) ([place]) 12/16/2011  . Coronary heart disease   . CVA (cerebrovascular accident) ([place])   . GERD (gastroesophageal reflux disease)   . Grief reaction 03/18/2014  . History of chicken pox   . History of kidney stones   . Hyperlipidemia   . Hypertension   . IBS (irritable bowel syndrome) 09/20/2013  . Medicare annual wellness visit, subsequent 03/23/2014  . PAF (paroxysmal atrial fibrillation) ([place])   . Symptomatic bradycardia    a. symptomatic bradycardia due to sinus node dysfunction s/p RA and RV Medtronic pacemaker 04/15/16  Past Surgical History:  Procedure Laterality Date  . CATARACT EXTRACTION    . CHOLECYSTECTOMY  2003  . CORONARY ANGIOPLASTY WITH STENT PLACEMENT  2000  . EP IMPLANTABLE DEVICE N/A 04/15/2016   Procedure: Pacemaker Implant;  Surgeon: Evans Lance, MD; Medtronic (serial number [id number]) pacemaker; Laterality: Left    Social History   Social History  . Marital status: Widowed    Spouse name: N/A  . Number of children: 3  . Years of education: N/A   Occupational  History  . Retired     Retired   Social History Main Topics  . Smoking status: Former Smoker    Packs/day: 0.25    Years: 40.00    Types: Cigarettes    Start date: 10/30/1967    Quit date: 04/14/2016  . Smokeless tobacco: Never Used     Comment: 04-16 quit smoking  . Alcohol use No     Comment: Rare  . Drug use: No  . Sexual activity: No     Comment: wife died in 01-17-23 married 42 years, grandson livew with patient   Other Topics Concern  . Not on file   Social History Narrative  . No narrative on file    Family History  Problem Relation Age of Onset  . Arthritis Mother   . Hypertension Mother   . Heart disease Mother     Rheumatic fever  . Rheumatic fever Mother   . Other Mother     brain tumor  . Cancer Mother     leukemia  . Hypertension Father   . Alcohol abuse Father   . Diabetes Son   . Colon cancer Neg Hx   . Breast cancer Neg Hx   . Prostate cancer Neg Hx     ROS: no fevers or chills, productive cough, hemoptysis, dysphasia, odynophagia, melena, hematochezia, dysuria, hematuria, rash, seizure activity, orthopnea, PND, pedal edema, claudication. Remaining systems are negative.  Physical Exam: Well-developed well-nourished in no acute distress.  Skin is warm and dry.  HEENT is normal.  Neck is supple.  Chest is clear to auscultation with normal expansion.  Cardiovascular exam is irregular Abdominal exam nontender or distended. No masses palpated. Extremities show no edema. neuro grossly intact  ECG  A/P  1 Coronary artery disease-continue statin. No aspirin given need for anticoagulation.  2 hypertension-blood pressure is controlled. Continue present medications.  3 paroxysmal atrial fibrillation-continue beta blocker and xarelto.  4 Hyperlipidemia-given document coronary diseaseI will increase Lipitor to 40 mg daily. Check lipids and liver in 4 weeks.  5 tobacco abuse-patient has discontinued.  6 abdominal aortic aneurysm-follow-up abdominal  ultrasound August 2018.  7 status post pacemaker-Follow-up electrophysiology.  Kirk Ruths, MD

## 2016-08-25 ENCOUNTER — Encounter: Payer: Self-pay | Admitting: Cardiology

## 2016-08-25 ENCOUNTER — Ambulatory Visit (INDEPENDENT_AMBULATORY_CARE_PROVIDER_SITE_OTHER): Payer: Commercial Managed Care - HMO | Admitting: Cardiology

## 2016-08-25 VITALS — BP 124/84 | HR 80 | Ht 68.0 in | Wt 210.0 lb

## 2016-08-25 DIAGNOSIS — I2510 Atherosclerotic heart disease of native coronary artery without angina pectoris: Secondary | ICD-10-CM

## 2016-08-25 DIAGNOSIS — I480 Paroxysmal atrial fibrillation: Secondary | ICD-10-CM

## 2016-08-25 DIAGNOSIS — I10 Essential (primary) hypertension: Secondary | ICD-10-CM | POA: Diagnosis not present

## 2016-08-25 DIAGNOSIS — Z79899 Other long term (current) drug therapy: Secondary | ICD-10-CM | POA: Diagnosis not present

## 2016-08-25 DIAGNOSIS — E785 Hyperlipidemia, unspecified: Secondary | ICD-10-CM

## 2016-08-25 MED ORDER — ATORVASTATIN CALCIUM 40 MG PO TABS: 40.0000 mg | ORAL_TABLET | Freq: Every day | ORAL | 3 refills | Status: DC

## 2016-08-25 NOTE — Patient Instructions (Signed)
Your physician has recommended you make the following change in your medication:  INCREASE  ATORVASTATIN   TO 40 MG  EVERY DAY   Your physician recommends that you return for lab work in:  [place]   [place] wants you to follow-up in:  [place] will receive a reminder letter in the mail two months in advance. If you don't receive a letter, please call our office to schedule the follow-up appointment.

## 2016-09-06 ENCOUNTER — Other Ambulatory Visit: Payer: Self-pay | Admitting: Family Medicine

## 2016-09-22 ENCOUNTER — Other Ambulatory Visit (INDEPENDENT_AMBULATORY_CARE_PROVIDER_SITE_OTHER): Payer: Commercial Managed Care - HMO

## 2016-09-22 DIAGNOSIS — Z79899 Other long term (current) drug therapy: Secondary | ICD-10-CM | POA: Diagnosis not present

## 2016-09-22 DIAGNOSIS — E785 Hyperlipidemia, unspecified: Secondary | ICD-10-CM

## 2016-09-22 LAB — LIPID PANEL
Cholesterol: 107 mg/dL — ABNORMAL LOW (ref 125–200)
HDL: 27 mg/dL — ABNORMAL LOW (ref >=40)
LDL Cholesterol: 58 mg/dL (ref <130)
Total CHOL/HDL Ratio: 4 Ratio (ref <=5.0)
Triglycerides: 110 mg/dL (ref <150)
VLDL: 22 mg/dL (ref <30)

## 2016-09-22 LAB — HEPATIC FUNCTION PANEL
ALBUMIN: 3.8 g/dL (ref 3.6–5.1)
ALK PHOS: 73 U/L (ref 40–115)
ALT: 8 U/L — AB (ref 9–46)
AST: 12 U/L (ref 10–35)
Bilirubin, Direct: 0.1 mg/dL (ref <=0.2)
Indirect Bilirubin: 0.3 mg/dL (ref 0.2–1.2)
TOTAL PROTEIN: 6.3 g/dL (ref 6.1–8.1)
Total Bilirubin: 0.4 mg/dL (ref 0.2–1.2)

## 2016-09-29 ENCOUNTER — Other Ambulatory Visit: Payer: Self-pay | Admitting: Family Medicine

## 2016-09-29 ENCOUNTER — Encounter: Payer: Self-pay | Admitting: *Deleted

## 2016-09-29 NOTE — Telephone Encounter (Signed)
Rx request to pharmacy/SLS  

## 2016-10-02 ENCOUNTER — Other Ambulatory Visit: Payer: Self-pay | Admitting: Family Medicine

## 2016-10-08 ENCOUNTER — Other Ambulatory Visit: Payer: Self-pay | Admitting: Family Medicine

## 2016-10-20 ENCOUNTER — Other Ambulatory Visit: Payer: Self-pay | Admitting: Hematology & Oncology

## 2016-10-20 DIAGNOSIS — C9210 Chronic myeloid leukemia, BCR/ABL-positive, not having achieved remission: Secondary | ICD-10-CM

## 2016-10-21 ENCOUNTER — Encounter: Payer: Commercial Managed Care - HMO | Admitting: *Deleted

## 2016-10-21 ENCOUNTER — Telehealth: Payer: Self-pay | Admitting: Cardiology

## 2016-10-21 NOTE — Telephone Encounter (Signed)
Attempted to confirm remote transmission with pt. No answer and was unable to leave a message.   

## 2016-10-22 ENCOUNTER — Other Ambulatory Visit: Payer: Self-pay | Admitting: Family Medicine

## 2016-10-29 ENCOUNTER — Encounter: Payer: Self-pay | Admitting: Cardiology

## 2016-11-19 ENCOUNTER — Other Ambulatory Visit: Payer: Self-pay | Admitting: Family Medicine

## 2016-11-26 ENCOUNTER — Encounter: Payer: Self-pay | Admitting: Family

## 2016-11-26 ENCOUNTER — Ambulatory Visit (HOSPITAL_BASED_OUTPATIENT_CLINIC_OR_DEPARTMENT_OTHER): Payer: Commercial Managed Care - HMO | Admitting: Family

## 2016-11-26 ENCOUNTER — Other Ambulatory Visit (HOSPITAL_BASED_OUTPATIENT_CLINIC_OR_DEPARTMENT_OTHER): Payer: Commercial Managed Care - HMO

## 2016-11-26 VITALS — BP 131/79 | HR 85 | Temp 98.5°F | Resp 20 | Wt 204.0 lb

## 2016-11-26 DIAGNOSIS — C9211 Chronic myeloid leukemia, BCR/ABL-positive, in remission: Secondary | ICD-10-CM

## 2016-11-26 DIAGNOSIS — C9210 Chronic myeloid leukemia, BCR/ABL-positive, not having achieved remission: Secondary | ICD-10-CM

## 2016-11-26 LAB — COMPREHENSIVE METABOLIC PANEL
ALT: 10 U/L (ref 0–55)
ANION GAP: 10 meq/L (ref 3–11)
AST: 16 U/L (ref 5–34)
Albumin: 3.7 g/dL (ref 3.5–5.0)
Alkaline Phosphatase: 106 U/L (ref 40–150)
BILIRUBIN TOTAL: 0.53 mg/dL (ref 0.20–1.20)
BUN: 5.1 mg/dL — ABNORMAL LOW (ref 7.0–26.0)
CHLORIDE: 108 meq/L (ref 98–109)
CO2: 25 meq/L (ref 22–29)
Calcium: 9.3 mg/dL (ref 8.4–10.4)
Creatinine: 1 mg/dL (ref 0.7–1.3)
EGFR: 74 mL/min/{1.73_m2} — AB (ref >90)
Glucose: 92 mg/dl (ref 70–140)
POTASSIUM: 3.8 meq/L (ref 3.5–5.1)
SODIUM: 143 meq/L (ref 136–145)
Total Protein: 7.3 g/dL (ref 6.4–8.3)

## 2016-11-26 LAB — CBC WITH DIFFERENTIAL (CANCER CENTER ONLY)
BASO#: 0.1 10*3/uL (ref 0.0–0.2)
BASO%: 1.4 % (ref 0.0–2.0)
EOS ABS: 0.4 10*3/uL (ref 0.0–0.5)
EOS%: 5.1 % (ref 0.0–7.0)
HCT: 39.3 % (ref 38.7–49.9)
HGB: 13.2 g/dL (ref 13.0–17.1)
LYMPH#: 1.9 10*3/uL (ref 0.9–3.3)
LYMPH%: 24.2 % (ref 14.0–48.0)
MCH: 31.3 pg (ref 28.0–33.4)
MCHC: 33.6 g/dL (ref 32.0–35.9)
MCV: 93 fL (ref 82–98)
MONO#: 0.7 10*3/uL (ref 0.1–0.9)
MONO%: 8.6 % (ref 0.0–13.0)
NEUT#: 4.8 10*3/uL (ref 1.5–6.5)
NEUT%: 60.7 % (ref 40.0–80.0)
PLATELETS: 251 10*3/uL (ref 145–400)
RBC: 4.22 10*6/uL (ref 4.20–5.70)
RDW: 13.5 % (ref 11.1–15.7)
WBC: 7.9 10*3/uL (ref 4.0–10.0)

## 2016-11-26 NOTE — Progress Notes (Signed)
Hematology and Oncology Follow Up Visit  Jason [name] [id number] Jul 12, 1946 70 y.o. 11/26/2016   Principle Diagnosis:  Chronic phase CML Paroxysmal atrial fibrillation - has pacemaker   Current Therapy:   [name] 200 mg by mouth daily Xarelto 20 mg by mouth daily    Interim History:  Mr. Barwick is here today for a follow-up. He continues to do well and has had no problem with infections. His BCR/ABL in June was 0.01%. Today's level is pending.  He verbalized that he is taking his [name] 200 mg PO daily as prescribed.  No fever, chills, n/v, cough, rash, dizziness, SOB, chest pain, palpitations, abdominal pain, changes in bowel or bladder habits. He has occasional IBS, fluctuating between constipation and diarrhea.  No lymphadenopathy found on exam. No falls or syncope.  He improved since having the pacemaker placed. He has occasional palpitations but these are few and far between and resolve quickly. He is still on Xarelto daily and has had no issue with bleeding or bruising.  No swelling, tenderness, numbness or tingling in his extremities. No c/o pain at this time.  He has maintained a good appetite and [place] staying well hydrated. His weight is stable.   Medications:    Medication List       Accurate as of 11/26/16 11:09 AM. Always use your most recent med list.          acetaminophen 325 MG tablet Commonly known as:  TYLENOL Take 650 mg by mouth every 6 (six) hours as needed. For headache.   amLODipine 5 MG tablet Commonly known as:  NORVASC Take 1 tablet (5 mg total) by mouth daily.   atorvastatin 40 MG tablet Commonly known as:  LIPITOR Take 1 tablet (40 mg total) by mouth daily.   ferrous sulfate 325 (65 FE) MG EC tablet Take 1 tablet (325 mg total) by mouth 2 (two) times daily.   fluticasone 50 MCG/ACT nasal spray Commonly known as:  FLONASE PLACE 2 SPRAYS INTO BOTH NOSTRILS DAILY.   [name] 100 MG tablet Generic drug:  imatinib TAKE TWO TABLETS BY MOUTH  ONCE DAILY WITH MEALS AND  A  LARGE  GLASS  OF  WATER.   losartan 100 MG tablet Commonly known as:  COZAAR TAKE 1 TABLET EVERY DAY   meclizine 25 MG tablet Commonly known as:  ANTIVERT TAKE 1 TABLET THREE TIMES DAILY   metoprolol 50 MG tablet Commonly known as:  LOPRESSOR Take 1 tablet (50 mg total) by mouth 2 (two) times daily.   nitroGLYCERIN 0.4 MG SL tablet Commonly known as:  NITROSTAT Place 1 tablet (0.4 mg total) under the tongue every 5 (five) minutes as needed for chest pain. Please schedule appointment for refills.   omeprazole 40 MG capsule Commonly known as:  PRILOSEC Take 1 capsule (40 mg total) by mouth daily.   ondansetron 4 MG tablet Commonly known as:  ZOFRAN TAKE ONE TABLET BY MOUTH EVERY 8 HOURS AS NEEDED FOR NAUSEA AND VOMITING   PROBIOTIC FORMULA PO Take 1 capsule by mouth daily.   ranitidine 300 MG tablet Commonly known as:  ZANTAC TAKE ONE TABLET BY MOUTH AT BEDTIME   rivaroxaban 20 MG Tabs tablet Commonly known as:  XARELTO Take 1 tablet (20 mg total) by mouth daily with supper. TAKE 1 TABLET (20 MG) BY MOUTH DAILY WITH SUPPER.       Allergies: No Known Allergies  Past Medical History, Surgical history, Social history, and Family History were reviewed and updated.  Review of Systems: All other 10 point review of systems is negative.   Physical Exam:  weight is 204 lb (92.5 kg). His oral temperature is 98.5 F (36.9 C). His blood pressure is 131/79 and his pulse is 85. His respiration is 20.   Wt Readings from Last 3 Encounters:  11/26/16 204 lb (92.5 kg)  08/25/16 210 lb (95.3 kg)  08/06/16 210 lb 8 oz (95.5 kg)    Ocular: Sclerae unicteric, pupils equal, round and reactive to light Ear-nose-throat: Oropharynx clear, dentition fair Lymphatic: No cervical supraclavicular or axillary adenopathy Lungs no rales or rhonchi, good excursion bilaterally Heart regular rate and rhythm, no murmur appreciated Abd soft, nontender, positive  bowel sounds, no liver or spleen tip palpated on exam, no fluid wave MSK no focal spinal tenderness, no joint edema Neuro: non-focal, well-oriented, appropriate affect Breasts: Deferred  Lab Results  Component Value Date   WBC 7.9 11/26/2016   HGB 13.2 11/26/2016   HCT 39.3 11/26/2016   MCV 93 11/26/2016   PLT 251 11/26/2016   Lab Results  Component Value Date   IRON 84 05/27/2015   TIBC 359 05/27/2015   UIBC 275 05/27/2015   IRONPCTSAT 23 05/27/2015   Lab Results  Component Value Date   RETICCTPCT 1.2 05/27/2015   RBC 4.22 11/26/2016   RETICCTABS 48.2 05/27/2015   No results found for: KPAFRELGTCHN, LAMBDASER, KAPLAMBRATIO No results found for: IGGSERUM, IGA, IGMSERUM No results found for: Odetta Pink, SPEI   Chemistry      Component Value Date/Time   NA 143 05/27/2016 0923   K 3.5 05/27/2016 0923   CL 112 05/25/2016 1222   CL 109 (H) 03/31/2015 1119   CO2 24 05/27/2016 0923   BUN 7.5 05/27/2016 0923   CREATININE 1.1 05/27/2016 0923      Component Value Date/Time   CALCIUM 9.0 05/27/2016 0923   ALKPHOS 73 09/22/2016 0755   ALKPHOS 87 05/27/2016 0923   AST 12 09/22/2016 0755   AST 11 05/27/2016 0923   ALT 8 (L) 09/22/2016 0755   ALT <9 05/27/2016 0923   BILITOT 0.4 09/22/2016 0755   BILITOT 0.40 05/27/2016 0923     Impression and Plan: Mr. Ikner is a 70 yo white gentleman with chronic phase CML. He has responded nicely on [name]. His BCR/ABL was 0.01% in June. His level today is pending. He is asymptomatic at this time and doing well. CBC looks great. No anemia. WBC count is stable at 7.9.  We will continue him at his same dose of [name] 200 mg PO daily. Pending his lab results we may be able to give him a break from this.  We will go ahead and plan to see him back in 6 months for labs and follow-up.  He knows to contact us with any questions or concerns. We can certainly see him sooner if need be.    Eliezer Bottom, NP 12/1/201711:09 AM

## 2016-12-03 ENCOUNTER — Telehealth: Payer: Self-pay | Admitting: *Deleted

## 2016-12-03 NOTE — Telephone Encounter (Signed)
Called patient and left message to return call to schedule AWV w/ [facility].  Can schedule prior to appt on Monday 12/06/16 if pt prefers.

## 2016-12-06 ENCOUNTER — Ambulatory Visit (INDEPENDENT_AMBULATORY_CARE_PROVIDER_SITE_OTHER): Payer: Commercial Managed Care - HMO | Admitting: Family Medicine

## 2016-12-06 ENCOUNTER — Encounter: Payer: Self-pay | Admitting: Family Medicine

## 2016-12-06 DIAGNOSIS — I480 Paroxysmal atrial fibrillation: Secondary | ICD-10-CM

## 2016-12-06 DIAGNOSIS — K589 Irritable bowel syndrome without diarrhea: Secondary | ICD-10-CM

## 2016-12-06 DIAGNOSIS — C9210 Chronic myeloid leukemia, BCR/ABL-positive, not having achieved remission: Secondary | ICD-10-CM

## 2016-12-06 DIAGNOSIS — K219 Gastro-esophageal reflux disease without esophagitis: Secondary | ICD-10-CM

## 2016-12-06 DIAGNOSIS — E782 Mixed hyperlipidemia: Secondary | ICD-10-CM

## 2016-12-06 DIAGNOSIS — I714 Abdominal aortic aneurysm, without rupture, unspecified: Secondary | ICD-10-CM

## 2016-12-06 DIAGNOSIS — I10 Essential (primary) hypertension: Secondary | ICD-10-CM | POA: Diagnosis not present

## 2016-12-06 DIAGNOSIS — I495 Sick sinus syndrome: Secondary | ICD-10-CM

## 2016-12-06 DIAGNOSIS — Z720 Tobacco use: Secondary | ICD-10-CM

## 2016-12-06 NOTE — Assessment & Plan Note (Signed)
Well controlled, no changes to meds. Encouraged heart healthy diet such as the DASH diet and exercise as tolerated.  °

## 2016-12-06 NOTE — Assessment & Plan Note (Addendum)
Recent episode of constipation after eating excess cheese. Encouraged increased hydration and fiber in diet. Daily probiotics. If bowels not moving can use MOM 2 tbls po in 4 oz of warm prune juice by mouth every 2-3 days. If no results then repeat in 4 hours with  Dulcolax suppository pr, may repeat again in 4 more hours as needed. Seek care if symptoms worsen. Consider daily Miralax and/or Dulcolax if symptoms persist. Resolved readily after some straining

## 2016-12-06 NOTE — Assessment & Plan Note (Signed)
Follows with hematology, has seen them recently no new concerns identified

## 2016-12-06 NOTE — Progress Notes (Signed)
Pre visit review using our clinic review tool, if applicable. No additional management support is needed unless otherwise documented below in the visit note. 

## 2016-12-06 NOTE — Assessment & Plan Note (Signed)
Tolerating Xarelto, following with cardiology and doing well

## 2016-12-06 NOTE — Assessment & Plan Note (Signed)
Tolerating statin, encouraged heart healthy diet, avoid trans fats, minimize simple carbs and saturated fats. Increase exercise as tolerated 

## 2016-12-06 NOTE — Assessment & Plan Note (Addendum)
Encouraged continued complete cessation. Discussed need to quit as relates to risk of numerous cancers, cardiac and pulmonary disease as well as neurologic complications. Counseled for greater than 3 minutes. Last cigarette in May of 2017 encouraged ongoing efforts to abstain

## 2016-12-06 NOTE — Assessment & Plan Note (Signed)
Doing much better with pacer in place, no syncopal episodes. Following with cardiology

## 2016-12-06 NOTE — Assessment & Plan Note (Signed)
Asymptomatic, encouraged to continue to abstain from cigarettes and report any concerning sympotms

## 2016-12-06 NOTE — Progress Notes (Signed)
Patient ID: Jason [name], male   DOB: [birth year], 70 y.o.   MRN: [id number]   Subjective:    Patient ID: Jason [name], male    DOB: 16-Aug-1946, 70 y.o.   MRN: [id number]  Chief Complaint  Patient presents with  . Follow-up    No new cocnerns noted.    HPI Patient is in today for  Follow up. He generally feels well today. No recent febrile illness or hospitalizations. Denies polyuria or polydipsia. Is noting a recent episode of constipation after eating too much but after one episode of straining which caused some lightheadedness. Now bowels moving well now. Several loose stool daily. No bloody or tarry stool. Denies CP/palp/SOB/HA/congestion/fevers or GU c/o. Taking meds as prescribed. No cigarette since May 2017. Still has cravings but improving.   Past Medical History:  Diagnosis Date  . Anemia 05/08/2015  . CML (chronic myelocytic leukemia) ([place]) 12/16/2011  . Coronary heart disease   . CVA (cerebrovascular accident) ([place])   . GERD (gastroesophageal reflux disease)   . Grief reaction 03/18/2014  . History of chicken pox   . History of kidney stones   . Hyperlipidemia   . Hypertension   . IBS (irritable bowel syndrome) 09/20/2013  . Medicare annual wellness visit, subsequent 03/23/2014  . PAF (paroxysmal atrial fibrillation) ([place])   . Symptomatic bradycardia    a. symptomatic bradycardia due to sinus node dysfunction s/p RA and RV Medtronic pacemaker 04/15/16    Past Surgical History:  Procedure Laterality Date  . CATARACT EXTRACTION    . CHOLECYSTECTOMY  2003  . CORONARY ANGIOPLASTY WITH STENT PLACEMENT  2000  . EP IMPLANTABLE DEVICE N/A 04/15/2016   Procedure: Pacemaker Implant;  Surgeon: Evans Lance, MD; Medtronic (serial number [id number]) pacemaker; Laterality: Left    Family History  Problem Relation Age of Onset  . Arthritis Mother   . Hypertension Mother   . Heart disease Mother     Rheumatic fever  . Rheumatic fever Mother   . Other Mother     brain tumor  .  Cancer Mother     leukemia  . Hypertension Father   . Alcohol abuse Father   . Diabetes Son   . Colon cancer Neg Hx   . Breast cancer Neg Hx   . Prostate cancer Neg Hx     Social History   Social History  . Marital status: Widowed    Spouse name: N/A  . Number of children: 3  . Years of education: N/A   Occupational History  . Retired     Retired   Social History Main Topics  . Smoking status: Former Smoker    Packs/day: 0.25    Years: 40.00    Types: Cigarettes    Start date: 10/30/1967    Quit date: 04/14/2016  . Smokeless tobacco: Never Used     Comment: 04-16 quit smoking  . Alcohol use No     Comment: Rare  . Drug use: No  . Sexual activity: No     Comment: wife died in 01/22/23 married 31 years, grandson livew with patient   Other Topics Concern  . Not on file   Social History Narrative  . No narrative on file    Outpatient Medications Prior to Visit  Medication Sig Dispense Refill  . acetaminophen (TYLENOL) 325 MG tablet Take 650 mg by mouth every 6 (six) hours as needed. For headache.    Marland Kitchen amLODipine (NORVASC) 5 MG tablet Take 1 tablet (5  mg total) by mouth daily. 90 tablet 1  . atorvastatin (LIPITOR) 40 MG tablet Take 1 tablet (40 mg total) by mouth daily. 90 tablet 3  . ferrous sulfate 325 (65 FE) MG EC tablet Take 1 tablet (325 mg total) by mouth 2 (two) times daily. 60 tablet 11  . fluticasone (FLONASE) 50 MCG/ACT nasal spray PLACE 2 SPRAYS INTO BOTH NOSTRILS DAILY. 48 g 3  . [name] 100 MG tablet TAKE TWO TABLETS BY MOUTH ONCE DAILY WITH MEALS AND  A  LARGE  GLASS  OF  WATER. 60 tablet 6  . losartan (COZAAR) 100 MG tablet TAKE 1 TABLET EVERY DAY 90 tablet 0  . meclizine (ANTIVERT) 25 MG tablet TAKE 1 TABLET THREE TIMES DAILY 90 tablet 0  . metoprolol (LOPRESSOR) 50 MG tablet Take 1 tablet (50 mg total) by mouth 2 (two) times daily. 60 tablet 10  . nitroGLYCERIN (NITROSTAT) 0.4 MG SL tablet Place 1 tablet (0.4 mg total) under the tongue every 5 (five)  minutes as needed for chest pain. Please schedule appointment for refills. 25 tablet 0  . omeprazole (PRILOSEC) 40 MG capsule Take 1 capsule (40 mg total) by mouth daily. 90 capsule 3  . ondansetron (ZOFRAN) 4 MG tablet TAKE ONE TABLET BY MOUTH EVERY 8 HOURS AS NEEDED FOR NAUSEA AND VOMITING 40 tablet 1  . Probiotic Product (PROBIOTIC FORMULA PO) Take 1 capsule by mouth daily.     . ranitidine (ZANTAC) 300 MG tablet TAKE ONE TABLET BY MOUTH AT BEDTIME 30 tablet 3  . rivaroxaban (XARELTO) 20 MG TABS tablet Take 1 tablet (20 mg total) by mouth daily with supper. TAKE 1 TABLET (20 MG) BY MOUTH DAILY WITH SUPPER. (Patient taking differently: Take 20 mg by mouth at bedtime. TAKE 1 TABLET (20 MG) BY MOUTH DAILY WITH SUPPER.) 90 tablet 0   No facility-administered medications prior to visit.     No Known Allergies  Review of Systems  Constitutional: Negative for fever and malaise/fatigue.  HENT: Negative for congestion.   Eyes: Negative for blurred vision.  Respiratory: Negative for shortness of breath.   Cardiovascular: Negative for chest pain, palpitations and leg swelling.  Gastrointestinal: Positive for constipation. Negative for blood in stool, melena and nausea.  Genitourinary: Negative for dysuria and frequency.  Musculoskeletal: Negative for falls.  Skin: Negative for rash.  Neurological: Negative for dizziness, loss of consciousness and headaches.  Endo/Heme/Allergies: Negative for environmental allergies.  Psychiatric/Behavioral: Negative for depression. The patient is not nervous/anxious.        Objective:    Physical Exam  Constitutional: He is oriented to person, place, and time. He appears well-developed and well-nourished. No distress.  HENT:  Head: Normocephalic and atraumatic.  Nose: Nose normal.  Eyes: Right eye exhibits no discharge. Left eye exhibits no discharge.  Neck: Normal range of motion. Neck supple.  Cardiovascular: Normal rate.   Murmur  heard. Pulmonary/Chest: Effort normal and breath sounds normal.  Abdominal: Soft. Bowel sounds are normal. There is no tenderness.  Musculoskeletal: He exhibits no edema.  Neurological: He is alert and oriented to person, place, and time.  Skin: Skin is warm and dry.  Psychiatric: He has a normal mood and affect.  Nursing note and vitals reviewed.   BP 130/80 (BP Location: Left Arm, Patient Position: Sitting, Cuff Size: Large)   Pulse 83   Temp 97.8 F (36.6 C) (Oral)   Wt 201 lb 6.4 oz (91.4 kg)   SpO2 97% Comment: RA  BMI 30.62 kg/m  Wt Readings from Last 3 Encounters:  12/06/16 201 lb 6.4 oz (91.4 kg)  11/26/16 204 lb (92.5 kg)  08/25/16 210 lb (95.3 kg)     Lab Results  Component Value Date   WBC 7.9 11/26/2016   HGB 13.2 11/26/2016   HCT 39.3 11/26/2016   PLT 251 11/26/2016   GLUCOSE 92 11/26/2016   CHOL 107 (L) 09/22/2016   TRIG 110 09/22/2016   HDL 27 (L) 09/22/2016   LDLCALC 58 09/22/2016   ALT 10 11/26/2016   AST 16 11/26/2016   NA 143 11/26/2016   K 3.8 11/26/2016   CL 112 05/25/2016   CREATININE 1.0 11/26/2016   BUN 5.1 (L) 11/26/2016   CO2 25 11/26/2016   TSH 1.83 05/25/2016   INR 1.07 06/27/2014    Lab Results  Component Value Date   TSH 1.83 05/25/2016   Lab Results  Component Value Date   WBC 7.9 11/26/2016   HGB 13.2 11/26/2016   HCT 39.3 11/26/2016   MCV 93 11/26/2016   PLT 251 11/26/2016   Lab Results  Component Value Date   NA 143 11/26/2016   K 3.8 11/26/2016   CHLORIDE 108 11/26/2016   CO2 25 11/26/2016   GLUCOSE 92 11/26/2016   BUN 5.1 (L) 11/26/2016   CREATININE 1.0 11/26/2016   BILITOT 0.53 11/26/2016   ALKPHOS 106 11/26/2016   AST 16 11/26/2016   ALT 10 11/26/2016   PROT 7.3 11/26/2016   ALBUMIN 3.7 11/26/2016   CALCIUM 9.3 11/26/2016   ANIONGAP 10 11/26/2016   EGFR 74 (L) 11/26/2016   GFR 73.01 05/25/2016   Lab Results  Component Value Date   CHOL 107 (L) 09/22/2016   Lab Results  Component Value Date    HDL 27 (L) 09/22/2016   Lab Results  Component Value Date   LDLCALC 58 09/22/2016   Lab Results  Component Value Date   TRIG 110 09/22/2016   Lab Results  Component Value Date   CHOLHDL 4.0 09/22/2016   No results found for: HGBA1C     Assessment & Plan:   Problem List Items Addressed This Visit    CML (chronic myelocytic leukemia) ([place]) (Chronic)    Follows with hematology, has seen them recently no new concerns identified      HTN (hypertension) (Chronic)    Well controlled, no changes to meds. Encouraged heart healthy diet such as the DASH diet and exercise as tolerated.       Hyperlipidemia, mixed    Tolerating statin, encouraged heart healthy diet, avoid trans fats, minimize simple carbs and saturated fats. Increase exercise as tolerated      Tobacco use    Encouraged continued complete cessation. Discussed need to quit as relates to risk of numerous cancers, cardiac and pulmonary disease as well as neurologic complications. Counseled for greater than 3 minutes. Last cigarette in May of 2017 encouraged ongoing efforts to abstain      Atrial fibrillation ([place])    Tolerating Xarelto, following with cardiology and doing well      IBS (irritable bowel syndrome)    Recent episode of constipation after eating excess cheese. Encouraged increased hydration and fiber in diet. Daily probiotics. If bowels not moving can use MOM 2 tbls po in 4 oz of warm prune juice by mouth every 2-3 days. If no results then repeat in 4 hours with  Dulcolax suppository pr, may repeat again in 4 more hours as needed. Seek care if symptoms worsen. Consider daily Miralax  and/or Dulcolax if symptoms persist. Resolved readily after some straining      Esophageal reflux    Avoid offending foods, continue probiotics. Do not eat large meals in late evening and consider raising head of bed.       Abdominal aortic aneurysm (HCC)    Asymptomatic, encouraged to continue to abstain from cigarettes and  report any concerning sympotms      Tachy-brady syndrome [facility])    Doing much better with pacer in place, no syncopal episodes. Following with cardiology         I am having Mr. Scripter maintain his Probiotic Product (PROBIOTIC FORMULA PO), acetaminophen, ferrous sulfate, nitroGLYCERIN, metoprolol, rivaroxaban, amLODipine, omeprazole, atorvastatin, ondansetron, losartan, ranitidine, fluticasone, [name], and meclizine.  No orders of the defined types were placed in this encounter.    Penni Homans, MD

## 2016-12-06 NOTE — Assessment & Plan Note (Signed)
Avoid offending foods, continue probiotics. Do not eat large meals in late evening and consider raising head of bed.  

## 2016-12-06 NOTE — Patient Instructions (Signed)
Encouraged increased hydration and fiber in diet consider a dose of Benefiber in your drinks twice daily. Daily probiotics such as Digestive Advantage, [place] or [place] caps daily. If bowels not moving can use MOM 2 tbls po in 4 oz of warm prune juice by mouth every 2-3 days. If no results then repeat in 4 hours with  Dulcolax suppository pr, may repeat again in 4 more hours as needed. Seek care if symptoms worsen. Consider daily Miralax and/or Dulcolax if symptoms persist.    Constipation, Adult Constipation is when a person:  Poops (has a bowel movement) fewer times in a week than normal.  Has a hard time pooping.  Has poop that is dry, hard, or bigger than normal. Follow these instructions at home: Eating and drinking  Eat foods that have a lot of fiber, such as:  Fresh fruits and vegetables.  Whole grains.  Beans.  Eat less of foods that are high in fat, low in fiber, or overly processed, such as:  [place] fries.  Hamburgers.  Cookies.  Candy.  Soda.  Drink enough fluid to keep your pee (urine) clear or pale yellow. General instructions  Exercise regularly or as told by your doctor.  Go to the restroom when you feel like you need to poop. Do not hold it in.  Take over-the-counter and prescription medicines only as told by your doctor. These include any fiber supplements.  Do pelvic floor retraining exercises, such as:  Doing deep breathing while relaxing your lower belly (abdomen).  Relaxing your pelvic floor while pooping.  Watch your condition for any changes.  Keep all follow-up visits as told by your doctor. This is important. Contact a doctor if:  You have pain that gets worse.  You have a fever.  You have not pooped for 4 days.  You throw up (vomit).  You are not hungry.  You lose weight.  You are bleeding from the anus.  You have thin, pencil-like poop (stool). Get help right away if:  You have a fever, and your  symptoms suddenly get worse.  You leak poop or have blood in your poop.  Your belly feels hard or bigger than normal (is bloated).  You have very bad belly pain.  You feel dizzy or you faint. This information is not intended to replace advice given to you by your health care provider. Make sure you discuss any questions you have with your health care provider. Document Released: 05/31/2008 Document Revised: 07/02/2016 Document Reviewed: 06/02/2016 Elsevier Interactive Patient Education  2017 [employer].

## 2016-12-07 ENCOUNTER — Telehealth: Payer: Self-pay | Admitting: *Deleted

## 2016-12-07 NOTE — Telephone Encounter (Addendum)
Patient aware of results  ----- Message from Volanda Napoleon, MD sent at 12/06/2016  4:18 PM EST ----- Call - NO CML by genetic tests!!!  pete

## 2016-12-15 ENCOUNTER — Other Ambulatory Visit: Payer: Self-pay | Admitting: Family Medicine

## 2016-12-24 ENCOUNTER — Other Ambulatory Visit: Payer: Self-pay | Admitting: Family Medicine

## 2017-01-10 ENCOUNTER — Other Ambulatory Visit: Payer: Self-pay | Admitting: Family Medicine

## 2017-01-20 ENCOUNTER — Telehealth: Payer: Self-pay | Admitting: Family Medicine

## 2017-01-20 NOTE — Telephone Encounter (Signed)
Patient dropped off forms from [facility] regarding Meclizine 25 tablet

## 2017-03-30 ENCOUNTER — Telehealth: Payer: Self-pay | Admitting: *Deleted

## 2017-03-30 NOTE — Telephone Encounter (Signed)
LM for patient to return call to schedule AWV.   

## 2017-04-05 ENCOUNTER — Other Ambulatory Visit: Payer: Self-pay

## 2017-04-05 ENCOUNTER — Other Ambulatory Visit: Payer: Self-pay | Admitting: Family Medicine

## 2017-04-05 MED ORDER — RANITIDINE HCL 300 MG PO TABS: 300.0000 mg | ORAL_TABLET | Freq: Every day | ORAL | 3 refills | Status: DC

## 2017-04-05 MED ORDER — METOPROLOL TARTRATE 50 MG PO TABS: 50.0000 mg | ORAL_TABLET | Freq: Two times a day (BID) | ORAL | 1 refills | Status: DC

## 2017-04-05 NOTE — Telephone Encounter (Signed)
Rx(s) sent to pharmacy electronically.  

## 2017-04-08 ENCOUNTER — Ambulatory Visit (INDEPENDENT_AMBULATORY_CARE_PROVIDER_SITE_OTHER): Payer: Medicare HMO | Admitting: Family Medicine

## 2017-04-08 ENCOUNTER — Encounter: Payer: Self-pay | Admitting: Family Medicine

## 2017-04-08 VITALS — BP 115/79 | HR 86 | Temp 98.2°F | Resp 16 | Ht 68.0 in | Wt 197.8 lb

## 2017-04-08 DIAGNOSIS — I480 Paroxysmal atrial fibrillation: Secondary | ICD-10-CM | POA: Diagnosis not present

## 2017-04-08 DIAGNOSIS — R110 Nausea: Secondary | ICD-10-CM

## 2017-04-08 DIAGNOSIS — Z87891 Personal history of nicotine dependence: Secondary | ICD-10-CM

## 2017-04-08 DIAGNOSIS — E782 Mixed hyperlipidemia: Secondary | ICD-10-CM

## 2017-04-08 DIAGNOSIS — R42 Dizziness and giddiness: Secondary | ICD-10-CM

## 2017-04-08 DIAGNOSIS — I10 Essential (primary) hypertension: Secondary | ICD-10-CM

## 2017-04-08 LAB — LIPID PANEL
CHOLESTEROL: 106 mg/dL (ref 0–200)
HDL: 30.1 mg/dL — AB (ref >39.00)
LDL Cholesterol: 47 mg/dL (ref 0–99)
NonHDL: 76.13
TRIGLYCERIDES: 148 mg/dL (ref 0.0–149.0)
Total CHOL/HDL Ratio: 4
VLDL: 29.6 mg/dL (ref 0.0–40.0)

## 2017-04-08 LAB — COMPREHENSIVE METABOLIC PANEL
ALBUMIN: 3.9 g/dL (ref 3.5–5.2)
ALK PHOS: 89 U/L (ref 39–117)
ALT: 13 U/L (ref 0–53)
AST: 16 U/L (ref 0–37)
BUN: 11 mg/dL (ref 6–23)
CALCIUM: 9.3 mg/dL (ref 8.4–10.5)
CO2: 28 mEq/L (ref 19–32)
Chloride: 110 mEq/L (ref 96–112)
Creatinine, Ser: 1.15 mg/dL (ref 0.40–1.50)
GFR: 67 mL/min (ref >60.00)
Glucose, Bld: 89 mg/dL (ref 70–99)
POTASSIUM: 4.2 meq/L (ref 3.5–5.1)
Sodium: 143 mEq/L (ref 135–145)
TOTAL PROTEIN: 7 g/dL (ref 6.0–8.3)
Total Bilirubin: 0.3 mg/dL (ref 0.2–1.2)

## 2017-04-08 LAB — TSH: TSH: 1.41 u[IU]/mL (ref 0.35–4.50)

## 2017-04-08 LAB — CBC
HEMATOCRIT: 39.6 % (ref 39.0–52.0)
Hemoglobin: 13.1 g/dL (ref 13.0–17.0)
MCHC: 33.1 g/dL (ref 30.0–36.0)
MCV: 93.6 fl (ref 78.0–100.0)
PLATELETS: 247 10*3/uL (ref 150.0–400.0)
RBC: 4.23 Mil/uL (ref 4.22–5.81)
RDW: 15.1 % (ref 11.5–15.5)
WBC: 9.3 10*3/uL (ref 4.0–10.5)

## 2017-04-08 NOTE — Progress Notes (Signed)
Patient ID: Jason [name], male   DOB: 1946/08/31, 71 y.o.   MRN: [id number]   Subjective:    Patient ID: Jason [name], male    DOB: Aug 20, 1946, 71 y.o.   MRN: [id number]  No chief complaint on file.   HPI Patient is in today for f/u on HTN, hyperlipidemia, tobacco abuse and more. Has some episodes of feeling light headed intermittently. He does associate it with caffeine intake but he denies CP or palpitations. No falls or syncope. He does not drink water and he often skips meals. No recent febrile illness or hospitalization. Denies CP/palp/SOB/HA/congestion/fevers/GI or GU c/o. Taking meds as prescribed  Past Medical History:  Diagnosis Date  . Anemia 05/08/2015  . CML (chronic myelocytic leukemia) ([place]) 12/16/2011  . Coronary heart disease   . CVA (cerebrovascular accident) ([place])   . GERD (gastroesophageal reflux disease)   . Grief reaction 03/18/2014  . H/O tobacco use, presenting hazards to health 02/24/2012   No cigarettes since pacemaker placement. Encouraged ongoing efforts   . History of chicken pox   . History of kidney stones   . Hyperlipidemia   . Hypertension   . IBS (irritable bowel syndrome) 09/20/2013  . Medicare annual wellness visit, subsequent 03/23/2014  . PAF (paroxysmal atrial fibrillation) ([place])   . Symptomatic bradycardia    a. symptomatic bradycardia due to sinus node dysfunction s/p RA and RV Medtronic pacemaker 04/15/16    Past Surgical History:  Procedure Laterality Date  . CATARACT EXTRACTION    . CHOLECYSTECTOMY  2003  . CORONARY ANGIOPLASTY WITH STENT PLACEMENT  2000  . EP IMPLANTABLE DEVICE N/A 04/15/2016   Procedure: Pacemaker Implant;  Surgeon: Evans Lance, MD; Medtronic (serial number [id number]) pacemaker; Laterality: Left    Family History  Problem Relation Age of Onset  . Arthritis Mother   . Hypertension Mother   . Heart disease Mother     Rheumatic fever  . Rheumatic fever Mother   . Other Mother     brain tumor  . Cancer Mother     leukemia  . Hypertension Father   . Alcohol abuse Father   . Diabetes Son   . Colon cancer Neg Hx   . Breast cancer Neg Hx   . Prostate cancer Neg Hx     Social History   Social History  . Marital status: Widowed    Spouse name: N/A  . Number of children: 3  . Years of education: N/A   Occupational History  . Retired     Retired   Social History Main Topics  . Smoking status: Former Smoker    Packs/day: 0.25    Years: 40.00    Types: Cigarettes    Start date: 10/30/1967    Quit date: 04/14/2016  . Smokeless tobacco: Never Used     Comment: 04-16 quit smoking  . Alcohol use No     Comment: Rare  . Drug use: No  . Sexual activity: No     Comment: wife died in January 19, 2023 married 36 years, grandson livew with patient   Other Topics Concern  . Not on file   Social History Narrative  . No narrative on file    Outpatient Medications Prior to Visit  Medication Sig Dispense Refill  . acetaminophen (TYLENOL) 325 MG tablet Take 650 mg by mouth every 6 (six) hours as needed. For headache.    Marland Kitchen amLODipine (NORVASC) 5 MG tablet Take 1 tablet (5 mg total) by mouth daily. 90 tablet  1  . atorvastatin (LIPITOR) 40 MG tablet Take 1 tablet (40 mg total) by mouth daily. 90 tablet 3  . ferrous sulfate 325 (65 FE) MG EC tablet Take 1 tablet (325 mg total) by mouth 2 (two) times daily. 60 tablet 11  . fluticasone (FLONASE) 50 MCG/ACT nasal spray PLACE 2 SPRAYS INTO BOTH NOSTRILS DAILY. 48 g 3  . [name] 100 MG tablet TAKE TWO TABLETS BY MOUTH ONCE DAILY WITH MEALS AND  A  LARGE  GLASS  OF  WATER. 60 tablet 6  . losartan (COZAAR) 100 MG tablet TAKE 1 TABLET EVERY DAY 90 tablet 0  . meclizine (ANTIVERT) 25 MG tablet TAKE 1 TABLET THREE TIMES DAILY 90 tablet 0  . metoprolol (LOPRESSOR) 50 MG tablet Take 1 tablet (50 mg total) by mouth 2 (two) times daily. 180 tablet 1  . nitroGLYCERIN (NITROSTAT) 0.4 MG SL tablet Place 1 tablet (0.4 mg total) under the tongue every 5 (five) minutes as needed for  chest pain. Please schedule appointment for refills. 25 tablet 0  . omeprazole (PRILOSEC) 40 MG capsule Take 1 capsule (40 mg total) by mouth daily. 90 capsule 3  . ondansetron (ZOFRAN) 4 MG tablet TAKE ONE TABLET BY MOUTH EVERY 8 HOURS AS NEEDED FOR NAUSEA AND VOMITING 40 tablet 1  . Probiotic Product (PROBIOTIC FORMULA PO) Take 1 capsule by mouth daily.     . ranitidine (ZANTAC) 300 MG tablet Take 1 tablet (300 mg total) by mouth at bedtime. 90 tablet 3  . rivaroxaban (XARELTO) 20 MG TABS tablet Take 1 tablet (20 mg total) by mouth daily with supper. TAKE 1 TABLET (20 MG) BY MOUTH DAILY WITH SUPPER. (Patient taking differently: Take 20 mg by mouth at bedtime. TAKE 1 TABLET (20 MG) BY MOUTH DAILY WITH SUPPER.) 90 tablet 0   No facility-administered medications prior to visit.     No Known Allergies  Review of Systems  Constitutional: Positive for malaise/fatigue. Negative for fever.  HENT: Negative for congestion.   Eyes: Negative for blurred vision.  Respiratory: Negative for shortness of breath.   Cardiovascular: Negative for chest pain, palpitations and leg swelling.  Gastrointestinal: Negative for abdominal pain, blood in stool and nausea.  Genitourinary: Negative for dysuria and frequency.  Musculoskeletal: Negative for falls.  Skin: Negative for rash.  Neurological: Positive for dizziness. Negative for loss of consciousness and headaches.  Endo/Heme/Allergies: Negative for environmental allergies.  Psychiatric/Behavioral: Negative for depression. The patient is not nervous/anxious.        Objective:    Physical Exam  Constitutional: He is oriented to person, place, and time. He appears well-developed and well-nourished. No distress.  HENT:  Head: Normocephalic and atraumatic.  Eyes: Conjunctivae are normal.  Neck: Neck supple. No thyromegaly present.  Cardiovascular: Normal rate and normal heart sounds.   No murmur heard. Pulmonary/Chest: Effort normal and breath sounds  normal. No respiratory distress. He has no wheezes.  Abdominal: Soft. Bowel sounds are normal. He exhibits no mass. There is no tenderness.  Musculoskeletal: He exhibits no edema.  Lymphadenopathy:    He has no cervical adenopathy.  Neurological: He is alert and oriented to person, place, and time.  Skin: Skin is warm and dry.  Psychiatric: He has a normal mood and affect. His behavior is normal.    BP 115/79 (BP Location: Right Arm, Cuff Size: Normal)   Pulse 86   Temp 98.2 F (36.8 C) (Oral)   Resp 16   Ht _0  (1.727 m)  Wt 197 lb 12.8 oz (89.7 kg)   SpO2 98%   BMI 30.08 kg/m  Wt Readings from Last 3 Encounters:  04/08/17 197 lb 12.8 oz (89.7 kg)  12/06/16 201 lb 6.4 oz (91.4 kg)  11/26/16 204 lb (92.5 kg)     Lab Results  Component Value Date   WBC 7.9 11/26/2016   HGB 13.2 11/26/2016   HCT 39.3 11/26/2016   PLT 251 11/26/2016   GLUCOSE 92 11/26/2016   CHOL 107 (L) 09/22/2016   TRIG 110 09/22/2016   HDL 27 (L) 09/22/2016   LDLCALC 58 09/22/2016   ALT 10 11/26/2016   AST 16 11/26/2016   NA 143 11/26/2016   K 3.8 11/26/2016   CL 112 05/25/2016   CREATININE 1.0 11/26/2016   BUN 5.1 (L) 11/26/2016   CO2 25 11/26/2016   TSH 1.83 05/25/2016   INR 1.07 06/27/2014    Lab Results  Component Value Date   TSH 1.83 05/25/2016   Lab Results  Component Value Date   WBC 7.9 11/26/2016   HGB 13.2 11/26/2016   HCT 39.3 11/26/2016   MCV 93 11/26/2016   PLT 251 11/26/2016   Lab Results  Component Value Date   NA 143 11/26/2016   K 3.8 11/26/2016   CHLORIDE 108 11/26/2016   CO2 25 11/26/2016   GLUCOSE 92 11/26/2016   BUN 5.1 (L) 11/26/2016   CREATININE 1.0 11/26/2016   BILITOT 0.53 11/26/2016   ALKPHOS 106 11/26/2016   AST 16 11/26/2016   ALT 10 11/26/2016   PROT 7.3 11/26/2016   ALBUMIN 3.7 11/26/2016   CALCIUM 9.3 11/26/2016   ANIONGAP 10 11/26/2016   EGFR 74 (L) 11/26/2016   GFR 73.01 05/25/2016   Lab Results  Component Value Date   CHOL 107  (L) 09/22/2016   Lab Results  Component Value Date   HDL 27 (L) 09/22/2016   Lab Results  Component Value Date   LDLCALC 58 09/22/2016   Lab Results  Component Value Date   TRIG 110 09/22/2016   Lab Results  Component Value Date   CHOLHDL 4.0 09/22/2016   No results found for: HGBA1C     Assessment & Plan:   Problem List Items Addressed This Visit    HTN (hypertension) - Primary (Chronic)    Well controlled, no changes to meds. Encouraged heart healthy diet such as the DASH diet and exercise as tolerated.       Relevant Orders   CBC   Comprehensive metabolic panel   TSH   Hyperlipidemia, mixed    Tolerating statin, encouraged heart healthy diet, avoid trans fats, minimize simple carbs and saturated fats. Increase exercise as tolerated      Relevant Orders   Lipid panel   H/O tobacco use, presenting hazards to health    Encouraged complete cessation ongoing. Next week will be a year since he stopped, he is encouraged to continue his efforts      Nausea    Still struggles with nausea roughly twice a week but if he eats something he feels better, no vomitiing. Encouraged to avoid artificial sweeteners.       Atrial fibrillation (HCC)    Tolerating Xarelto. Rate controlled      Dizziness    Has some episodes of feeling light headed intermittently. He does associate it with caffeine intake but he denies CP or palpitations. No falls or syncope. He does not drink water and he often skips meals he is encouraged to increase  clear fluid intake to at least 64 oz of clear fluids and to eat a protein every 4 hours or so.         I am having Mr. Schulenburg maintain his Probiotic Product (PROBIOTIC FORMULA PO), acetaminophen, ferrous sulfate, nitroGLYCERIN, rivaroxaban, amLODipine, omeprazole, atorvastatin, losartan, fluticasone, [name], ondansetron, meclizine, ranitidine, and metoprolol.  No orders of the defined types were placed in this encounter.   Penni Homans, MD

## 2017-04-08 NOTE — Patient Instructions (Signed)

## 2017-04-08 NOTE — Assessment & Plan Note (Signed)
Tolerating statin, encouraged heart healthy diet, avoid trans fats, minimize simple carbs and saturated fats. Increase exercise as tolerated 

## 2017-04-08 NOTE — Assessment & Plan Note (Signed)
Has some episodes of feeling light headed intermittently. He does associate it with caffeine intake but he denies CP or palpitations. No falls or syncope. He does not drink water and he often skips meals he is encouraged to increase clear fluid intake to at least 64 oz of clear fluids and to eat a protein every 4 hours or so.

## 2017-04-08 NOTE — Assessment & Plan Note (Signed)
Still struggles with nausea roughly twice a week but if he eats something he feels better, no vomitiing. Encouraged to avoid artificial sweeteners.

## 2017-04-08 NOTE — Assessment & Plan Note (Signed)
Tolerating Xarelto. Rate controlled

## 2017-04-08 NOTE — Progress Notes (Signed)
Pre visit review using our clinic review tool, if applicable. No additional management support is needed unless otherwise documented below in the visit note. 

## 2017-04-08 NOTE — Assessment & Plan Note (Signed)
Well controlled, no changes to meds. Encouraged heart healthy diet such as the DASH diet and exercise as tolerated.  °

## 2017-04-08 NOTE — Assessment & Plan Note (Addendum)
Encouraged complete cessation ongoing. Next week will be a year since he stopped, he is encouraged to continue his efforts

## 2017-05-13 ENCOUNTER — Other Ambulatory Visit: Payer: Self-pay | Admitting: Family Medicine

## 2017-05-17 ENCOUNTER — Other Ambulatory Visit: Payer: Self-pay | Admitting: *Deleted

## 2017-05-17 MED ORDER — RIVAROXABAN 20 MG PO TABS: 20.0000 mg | ORAL_TABLET | Freq: Every day | ORAL | 0 refills | Status: DC

## 2017-05-27 ENCOUNTER — Ambulatory Visit (HOSPITAL_BASED_OUTPATIENT_CLINIC_OR_DEPARTMENT_OTHER): Payer: Medicare HMO | Admitting: Family

## 2017-05-27 ENCOUNTER — Other Ambulatory Visit (HOSPITAL_BASED_OUTPATIENT_CLINIC_OR_DEPARTMENT_OTHER): Payer: Medicare HMO

## 2017-05-27 VITALS — BP 124/60 | HR 49 | Temp 97.9°F | Resp 18 | Wt 198.5 lb

## 2017-05-27 DIAGNOSIS — I4891 Unspecified atrial fibrillation: Secondary | ICD-10-CM | POA: Diagnosis not present

## 2017-05-27 DIAGNOSIS — C9210 Chronic myeloid leukemia, BCR/ABL-positive, not having achieved remission: Secondary | ICD-10-CM | POA: Diagnosis not present

## 2017-05-27 LAB — CBC WITH DIFFERENTIAL (CANCER CENTER ONLY)
BASO#: 0.1 10*3/uL (ref 0.0–0.2)
BASO%: 0.6 % (ref 0.0–2.0)
EOS%: 4.2 % (ref 0.0–7.0)
Eosinophils Absolute: 0.5 10*3/uL (ref 0.0–0.5)
HEMATOCRIT: 39 % (ref 38.7–49.9)
HGB: 13 g/dL (ref 13.0–17.1)
LYMPH#: 2.5 10*3/uL (ref 0.9–3.3)
LYMPH%: 23.4 % (ref 14.0–48.0)
MCH: 31.5 pg (ref 28.0–33.4)
MCHC: 33.3 g/dL (ref 32.0–35.9)
MCV: 94 fL (ref 82–98)
MONO#: 1 10*3/uL — ABNORMAL HIGH (ref 0.1–0.9)
MONO%: 9.5 % (ref 0.0–13.0)
NEUT%: 62.3 % (ref 40.0–80.0)
NEUTROS ABS: 6.7 10*3/uL — AB (ref 1.5–6.5)
Platelets: 230 10*3/uL (ref 145–400)
RBC: 4.13 10*6/uL — ABNORMAL LOW (ref 4.20–5.70)
RDW: 14.4 % (ref 11.1–15.7)
WBC: 10.7 10*3/uL — ABNORMAL HIGH (ref 4.0–10.0)

## 2017-05-27 NOTE — Progress Notes (Signed)
Hematology and Oncology Follow Up Visit  Jason [name] [id number] 1946-09-10 71 y.o. 05/27/2017   Principle Diagnosis:  Chronic phase CML Paroxysmal atrial fibrillation - has pacemaker   Current Therapy:   [name] 200 mg by mouth daily Xarelto 20 mg by mouth daily   Interim History:  Jason [name] is here today for follow-up. He is doing well and denies having any fatigue.  He has had a few issues of dizziness due to vertigo. He has not had any more episodes since using meclizine.  He verbalized that he is taking his [name] daily as prescribed. BCR-ABL in December was down to 0.02%. Level checked again today and results are pending.  No episodes of bleeding, bruising or petechiae on Xarelto.  No lymphadenopathy found on exam.  He has had sinus congestion off and on with all the pollen.  No fever, chills, n/v, cough, rash, SOB, chest pain, palpitations, abdominal pain or changes in bowel or bladder habits.   No swelling, tenderness, numbness or tingling in his extremities. No c/o pain at this time.  He has maintained a good appetite and is staying hydrated. He admits to drinking lots of pop and not much water. His weight is   ECOG Performance Status: 1 - Symptomatic but completely ambulatory  Medications:  Allergies as of 05/27/2017   No Known Allergies     Medication List       Accurate as of 05/27/17 10:10 AM. Always use your most recent med list.          acetaminophen 325 MG tablet Commonly known as:  TYLENOL Take 650 mg by mouth every 6 (six) hours as needed. For headache.   amLODipine 5 MG tablet Commonly known as:  NORVASC Take 1 tablet (5 mg total) by mouth daily.   atorvastatin 40 MG tablet Commonly known as:  LIPITOR Take 1 tablet (40 mg total) by mouth daily.   ferrous sulfate 325 (65 FE) MG EC tablet Take 1 tablet (325 mg total) by mouth 2 (two) times daily.   fluticasone 50 MCG/ACT nasal spray Commonly known as:  FLONASE PLACE 2 SPRAYS INTO BOTH NOSTRILS  DAILY.   [name] 100 MG tablet Generic drug:  imatinib TAKE TWO TABLETS BY MOUTH ONCE DAILY WITH MEALS AND  A  LARGE  GLASS  OF  WATER.   losartan 100 MG tablet Commonly known as:  COZAAR TAKE 1 TABLET EVERY DAY   meclizine 25 MG tablet Commonly known as:  ANTIVERT TAKE 1 TABLET THREE TIMES DAILY   metoprolol tartrate 50 MG tablet Commonly known as:  LOPRESSOR Take 1 tablet (50 mg total) by mouth 2 (two) times daily.   nitroGLYCERIN 0.4 MG SL tablet Commonly known as:  NITROSTAT Place 1 tablet (0.4 mg total) under the tongue every 5 (five) minutes as needed for chest pain. Please schedule appointment for refills.   omeprazole 40 MG capsule Commonly known as:  PRILOSEC Take 1 capsule (40 mg total) by mouth daily.   ondansetron 4 MG tablet Commonly known as:  ZOFRAN TAKE ONE TABLET BY MOUTH EVERY 8 HOURS AS NEEDED FOR NAUSEA AND VOMITING   PROBIOTIC FORMULA PO Take 1 capsule by mouth daily.   ranitidine 300 MG tablet Commonly known as:  ZANTAC Take 1 tablet (300 mg total) by mouth at bedtime.   rivaroxaban 20 MG Tabs tablet Commonly known as:  XARELTO Take 1 tablet (20 mg total) by mouth daily with supper. TAKE 1 TABLET (20 MG) BY MOUTH DAILY WITH  SUPPER.       Allergies: No Known Allergies  Past Medical History, Surgical history, Social history, and Family History were reviewed and updated.  Review of Systems: All other 10 point review of systems is negative.   Physical Exam:  vitals were not taken for this visit.  Wt Readings from Last 3 Encounters:  04/08/17 197 lb 12.8 oz (89.7 kg)  12/06/16 201 lb 6.4 oz (91.4 kg)  11/26/16 204 lb (92.5 kg)    Ocular: Sclerae unicteric, pupils equal, round and reactive to light Ear-nose-throat: Oropharynx clear, dentition fair Lymphatic: No cervical, supraclavicular or axillary adenopathy Lungs no rales or rhonchi, good excursion bilaterally Heart regular rate and rhythm, no murmur appreciated Abd soft, nontender,  positive bowel sounds, no liver or spleen tip palpated on exam, no fluid wave  MSK no focal spinal tenderness, no joint edema Neuro: non-focal, well-oriented, appropriate affect Breasts: Deferred   Lab Results  Component Value Date   WBC 9.3 04/08/2017   HGB 13.1 04/08/2017   HCT 39.6 04/08/2017   MCV 93.6 04/08/2017   PLT 247.0 04/08/2017   Lab Results  Component Value Date   IRON 84 05/27/2015   TIBC 359 05/27/2015   UIBC 275 05/27/2015   IRONPCTSAT 23 05/27/2015   Lab Results  Component Value Date   RETICCTPCT 1.2 05/27/2015   RBC 4.23 04/08/2017   RETICCTABS 48.2 05/27/2015   No results found for: KPAFRELGTCHN, LAMBDASER, KAPLAMBRATIO No results found for: IGGSERUM, IGA, IGMSERUM No results found for: Odetta Pink, SPEI   Chemistry      Component Value Date/Time   NA 143 04/08/2017 1025   NA 143 11/26/2016 1030   K 4.2 04/08/2017 1025   K 3.8 11/26/2016 1030   CL 110 04/08/2017 1025   CL 109 (H) 03/31/2015 1119   CO2 28 04/08/2017 1025   CO2 25 11/26/2016 1030   BUN 11 04/08/2017 1025   BUN 5.1 (L) 11/26/2016 1030   CREATININE 1.15 04/08/2017 1025   CREATININE 1.0 11/26/2016 1030      Component Value Date/Time   CALCIUM 9.3 04/08/2017 1025   CALCIUM 9.3 11/26/2016 1030   ALKPHOS 89 04/08/2017 1025   ALKPHOS 106 11/26/2016 1030   AST 16 04/08/2017 1025   AST 16 11/26/2016 1030   ALT 13 04/08/2017 1025   ALT 10 11/26/2016 1030   BILITOT 0.3 04/08/2017 1025   BILITOT 0.53 11/26/2016 1030      Impression and Plan: Jason [name] is a very pleasant 71 yo caucasian male with chronic phase CML. He has tolerated treatment with [name] and has responded nicely. BCR-ABL in December was at 0.02%. His level is pending for today.  We will have him continue on his same regimen for now.   We will go ahead and plan to see him back in 6 months for repeat lab work and follow-up.  He will contact our office with any  questions or concerns. We can certainly see him sooner if need be.   Eliezer Bottom, NP 6/1/201810:10 AM

## 2017-06-03 ENCOUNTER — Encounter: Payer: Self-pay | Admitting: *Deleted

## 2017-06-18 ENCOUNTER — Other Ambulatory Visit: Payer: Self-pay | Admitting: Family Medicine

## 2017-06-18 ENCOUNTER — Other Ambulatory Visit: Payer: Self-pay | Admitting: Cardiology

## 2017-06-20 NOTE — Telephone Encounter (Signed)
Rx(s) sent to pharmacy electronically.  

## 2017-06-30 NOTE — Progress Notes (Signed)
Cardiology Office Note Date:  07/01/2017  Patient ID:  Jason, [name] May 03, 1946, MRN [id number] PCP:  Mosie Lukes, MD  Cardiologist:  [name] Electrophysiologist; Dr. Lovena Le   Chief Complaint: device visit  History of Present Illness: Jason [name] is a 71 y.o. male with history of CAD, PAFib, tachy-brady w/PPM, CML follows with [facility], CVA, HTN, HLD, AAA (stable 3.9cm by [place] 08/19/16, recommended f/u in 2 years) , comes in today to be seen for Dr. Lovena Le.  He was last seen by Dr. Lovena Le July 2017, at that time doing well without changes to his tx.  Most recently saw [name] in Aug 2017, also doing well without changes made.  He reports today doing well.  He has not had any CP, palpitations or heart awareness, he tends to suffer with GI issues/constipation mostly and this can given him a fullness in his abdomen and pushes up on his chest though resolves with BM.  No dizziness, near syncope or syncope.  He denies any bleeding or signs of bleeding outside of once when he blew his nose had small ammount of blood in the tissue.  It doesn't look like he is doing remotes, he tells me the transmitter in in his closet still in the box.  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  Device information: MDT dual chanber PPM, implanted 04/15/16, Dr. Lovena Le, tachy-brady   Past Medical History:  Diagnosis Date  . Anemia 05/08/2015  . CML (chronic myelocytic leukemia) ([place]) 12/16/2011  . Coronary heart disease   . CVA (cerebrovascular accident) ([place])   . GERD (gastroesophageal reflux disease)   . Grief reaction 03/18/2014  . H/O tobacco use, presenting hazards to health 02/24/2012   No cigarettes since pacemaker placement. Encouraged ongoing efforts   . History of chicken pox   . History of kidney stones   . Hyperlipidemia   . Hypertension   . IBS  (irritable bowel syndrome) 09/20/2013  . Medicare annual wellness visit, subsequent 03/23/2014  . PAF (paroxysmal atrial fibrillation) ([place])   . Symptomatic bradycardia    a. symptomatic bradycardia due to sinus node dysfunction s/p RA and RV Medtronic pacemaker 04/15/16    Past Surgical History:  Procedure Laterality Date  . CATARACT EXTRACTION    . CHOLECYSTECTOMY  2003  . CORONARY ANGIOPLASTY WITH STENT PLACEMENT  2000  . EP IMPLANTABLE DEVICE N/A 04/15/2016   Procedure: Pacemaker Implant;  Surgeon: Evans Lance, MD; Medtronic (serial number [id number]) pacemaker; Laterality: Left    Current Outpatient Prescriptions  Medication Sig Dispense Refill  . acetaminophen (TYLENOL) 325 MG tablet Take 650 mg by mouth every 6 (six) hours as needed. For headache.    Marland Kitchen amLODipine (NORVASC) 5 MG tablet Take 1 tablet (5 mg total) by mouth daily. <PLEASE MAKE APPOINTMENT FOR REFILLS> 90 tablet 0  . atorvastatin (LIPITOR) 40 MG tablet Take 1 tablet (40 mg total) by mouth daily. 90 tablet 3  . ferrous sulfate 325 (65 FE) MG EC tablet Take 1 tablet (325 mg total) by mouth 2 (two) times daily. 60 tablet 11  . fluticasone (FLONASE) 50 MCG/ACT nasal spray PLACE 2 SPRAYS INTO BOTH NOSTRILS DAILY. 48 g 3  . [name] 100 MG tablet TAKE TWO TABLETS BY MOUTH ONCE DAILY WITH MEALS AND  A  LARGE  GLASS  OF  WATER. 60 tablet 6  .  losartan (COZAAR) 100 MG tablet TAKE 1 TABLET EVERY DAY 90 tablet 0  . meclizine (ANTIVERT) 25 MG tablet TAKE 1 TABLET THREE TIMES DAILY 90 tablet 0  . metoprolol (LOPRESSOR) 50 MG tablet Take 1 tablet (50 mg total) by mouth 2 (two) times daily. 180 tablet 1  . nitroGLYCERIN (NITROSTAT) 0.4 MG SL tablet Place 1 tablet (0.4 mg total) under the tongue every 5 (five) minutes as needed for chest pain. Please schedule appointment for refills. 25 tablet 0  . omeprazole (PRILOSEC) 40 MG capsule Take 1 capsule (40 mg total) by mouth daily. 90 capsule 3  . ondansetron (ZOFRAN) 4 MG tablet TAKE ONE  TABLET BY MOUTH EVERY 8 HOURS AS NEEDED FOR NAUSEA AND VOMITING 40 tablet 1  . Probiotic Product (PROBIOTIC FORMULA PO) Take 1 capsule by mouth daily.     . ranitidine (ZANTAC) 300 MG tablet Take 1 tablet (300 mg total) by mouth at bedtime. 90 tablet 3  . rivaroxaban (XARELTO) 20 MG TABS tablet Take 1 tablet (20 mg total) by mouth daily with supper. TAKE 1 TABLET (20 MG) BY MOUTH DAILY WITH SUPPER. 90 tablet 0   No current facility-administered medications for this visit.     Allergies:   Patient has no known allergies.   Social History:  The patient  reports that he quit smoking about 14 months ago. His smoking use included Cigarettes. He started smoking about 49 years ago. He has a 10.00 pack-year smoking history. He has never used smokeless tobacco. He reports that he does not drink alcohol or use drugs.   Family History:  The patient's family history includes Alcohol abuse in his father; Arthritis in his mother; Cancer in his mother; Diabetes in his son; Heart disease in his mother; Hypertension in his father and mother; Other in his mother; Rheumatic fever in his mother.  ROS:  Please see the history of present illness.    All other systems are reviewed and otherwise negative.   PHYSICAL EXAM:  VS:  BP 138/78   Pulse 65   Ht 5\' 8"  (1.727 m)   Wt 198 lb (89.8 kg)   BMI 30.11 kg/m  BMI: Body mass index is 30.11 kg/m. Well nourished, well developed, in no acute distress  HEENT: normocephalic, atraumatic , poor dentitian Neck: no JVD, carotid bruits or masses Cardiac:  RRR; no significant murmurs, no rubs, or gallops Lungs:  CTA b/l , no wheezing, rhonchi or rales  Abd: soft, nontender MS: no deformity or atrophy Ext: no edema  Skin: warm and dry, no rash Neuro:  No gross deficits appreciated Psych: euthymic mood, full affect  PPM site is stable, no tethering or discomfort   EKG:  07/22/16: SR, RBBB, LAD PPM interrogation done today by industry and reviewed by myself: battery  and lead measurements are stable.  He presents AP/VS today, he is having T sensing on the A lead, sensitivity not felt to be able to adjust further, is falling in blanking.  + AMS once 27 hours, 13.5% burden, one NSVT 2 seconds  04/16/16: TTE Study Conclusions - Left ventricle: The cavity size was normal. Wall thickness was   normal. Systolic function was mildly to moderately reduced. The   estimated ejection fraction was in the range of 40% to 45%.   Diffuse hypokinesis. Doppler parameters are consistent with   abnormal left ventricular relaxation (grade 1 diastolic   dysfunction). - Aortic valve: Trileaflet; mildly calcified leaflets. There was   mild to moderate regurgitation. -  Aorta: Mildly dilated aortic root. Dilated ascending aorta.   Ascending aorta: 44 mm. Aortic root dimension: 37 mm (ED). - Mitral valve: There was no significant regurgitation. - Right ventricle: The cavity size was normal. Pacer wire or   catheter noted in right ventricle. Systolic function was normal. - Pulmonary arteries: No complete TR doppler jet so unable to   estimate PA systolic pressure. - Inferior vena cava: The vessel was normal in size. The   respirophasic diameter changes were in the normal range (>= 50%),   consistent with normal central venous pressure. Impressions: - Normal LV size with EF 40-45%, diffuse hypokinesis. Normal RV   size and systolic function Mild to moderate aortic insufficiency.   Mildly dilated aortic root and dilated ascending aorta.  04/03/15: stress myoview Impression Exercise Capacity:  Poor exercise capacity. (achived 94%MPHR) BP Response:  Normal blood pressure response. Clinical Symptoms:  Fatigue, shortness of breath ECG Impression:  No significant ST segment change suggestive of ischemia. Comparison with Prior Nuclear Study: No images to compare Overall Impression:  Intermediate risk stress nuclear study Secondary to large inferior, inferior septal/inferolateral wall  defect seen at both rest and stress consistent with infarct pattern. There is no ischemia identified.. LV Ejection Fraction: 45%.  LV Wall Motion:  Mild inferior wall hypokinesis noted.   Recent Labs: 04/08/2017: ALT 13; BUN 11; Creatinine, Ser 1.15; Potassium 4.2; Sodium 143; TSH 1.41 05/27/2017: HGB 13.0; Platelets 230  04/08/2017: Cholesterol 106; HDL 30.10; LDL Cholesterol 47; Total CHOL/HDL Ratio 4; Triglycerides 148.0; VLDL 29.6   CrCl cannot be calculated (Patient's most recent lab result is older than the maximum 21 days allowed.).   Wt Readings from Last 3 Encounters:  07/01/17 198 lb (89.8 kg)  05/27/17 198 lb 8 oz (90 kg)  04/08/17 197 lb 12.8 oz (89.7 kg)     Other studies reviewed: Additional studies/records reviewed today include: summarized above  ASSESSMENT AND PLAN:  1. Tachy-brady syndrome w/PPM     Stable device function, no changes made  2. CAD     No anginal symptoms     On BB, statin tx, mno ASA given a/c     C/w [name]  3. Paroxysmal AFib     CHA2DS2Vasc is at least 5, on Xarelto     04/08/17, calc creat clearance 77     H/H appears stable, 05/27/17 H/H 13/39  4. HTN     Looks OK, no changes today  Disposition: He was given instruction on remote monitoring and instructed by device clinic to sent a transmission, will plan Q 40month remote checks, annual in-clinic with EP.  Sooner if needed.  Current medicines are reviewed at length with the patient today.  The patient did not have any concerns regarding medicines.  Haywood Lasso, PA-C 07/01/2017 6:20 PM     [place]  [id number] [phone] (office)  [phone] (fax)

## 2017-07-01 ENCOUNTER — Ambulatory Visit (INDEPENDENT_AMBULATORY_CARE_PROVIDER_SITE_OTHER): Payer: Medicare HMO | Admitting: Physician Assistant

## 2017-07-01 VITALS — BP 138/78 | HR 65 | Ht 68.0 in | Wt 198.0 lb

## 2017-07-01 DIAGNOSIS — I480 Paroxysmal atrial fibrillation: Secondary | ICD-10-CM | POA: Diagnosis not present

## 2017-07-01 DIAGNOSIS — Z950 Presence of cardiac pacemaker: Secondary | ICD-10-CM

## 2017-07-01 DIAGNOSIS — I2510 Atherosclerotic heart disease of native coronary artery without angina pectoris: Secondary | ICD-10-CM

## 2017-07-01 DIAGNOSIS — I10 Essential (primary) hypertension: Secondary | ICD-10-CM | POA: Diagnosis not present

## 2017-07-01 DIAGNOSIS — I4891 Unspecified atrial fibrillation: Secondary | ICD-10-CM | POA: Diagnosis not present

## 2017-07-01 NOTE — Patient Instructions (Addendum)
Medication InstructIONS.  Your physician recommends that you continue on your current medications as directed. Please refer to the Current Medication list given to you today.    If you need a refill on your cardiac medications before your next appointment, please call your pharmacy.  Labwork: NONE ORDERED  TODAY    Testing/Procedures: NONE ORDERED  TODAY    Follow-Up: Your physician wants you to follow-up in:   Remote monitoring is used to monitor your Pacemaker of ICD from home. This monitoring reduces the number of office visits required to check your device to one time per year. It allows [place] to keep an eye on the functioning of your device to ensure it is working properly. You are scheduled for a device check from home on . You may send your transmission at any time that day. If you have a wireless device, the transmission will be sent automatically. After your physician reviews your transmission, you will receive a postcard with your next transmission date.   ONE YEAR WITH  Lovena Le You will receive a reminder letter in the mail two months in advance. If you don't receive a letter, please call our office to schedule the follow-up appointment.     Any Other Special Instructions Will Be Listed Below (If Applicable).

## 2017-07-12 ENCOUNTER — Other Ambulatory Visit: Payer: Self-pay | Admitting: Family Medicine

## 2017-07-12 ENCOUNTER — Other Ambulatory Visit: Payer: Self-pay | Admitting: Cardiology

## 2017-07-12 NOTE — Telephone Encounter (Signed)
Rx has been sent to the pharmacy electronically. ° °

## 2017-07-13 ENCOUNTER — Other Ambulatory Visit: Payer: Self-pay | Admitting: Hematology & Oncology

## 2017-07-13 DIAGNOSIS — C9210 Chronic myeloid leukemia, BCR/ABL-positive, not having achieved remission: Secondary | ICD-10-CM

## 2017-07-21 ENCOUNTER — Other Ambulatory Visit: Payer: Self-pay | Admitting: Cardiology

## 2017-08-01 ENCOUNTER — Encounter (HOSPITAL_BASED_OUTPATIENT_CLINIC_OR_DEPARTMENT_OTHER): Payer: Self-pay | Admitting: Emergency Medicine

## 2017-08-01 ENCOUNTER — Emergency Department (HOSPITAL_BASED_OUTPATIENT_CLINIC_OR_DEPARTMENT_OTHER)
Admission: EM | Admit: 2017-08-01 | Discharge: 2017-08-01 | Disposition: A | Discharge status: Home / Self Care | Payer: Medicare HMO | Attending: Emergency Medicine | Admitting: Emergency Medicine

## 2017-08-01 ENCOUNTER — Emergency Department (HOSPITAL_BASED_OUTPATIENT_CLINIC_OR_DEPARTMENT_OTHER): Payer: Medicare HMO

## 2017-08-01 DIAGNOSIS — Z856 Personal history of leukemia: Secondary | ICD-10-CM | POA: Insufficient documentation

## 2017-08-01 DIAGNOSIS — Z87891 Personal history of nicotine dependence: Secondary | ICD-10-CM | POA: Diagnosis not present

## 2017-08-01 DIAGNOSIS — R140 Abdominal distension (gaseous): Secondary | ICD-10-CM | POA: Diagnosis not present

## 2017-08-01 DIAGNOSIS — I259 Chronic ischemic heart disease, unspecified: Secondary | ICD-10-CM | POA: Diagnosis not present

## 2017-08-01 DIAGNOSIS — Z8673 Personal history of transient ischemic attack (TIA), and cerebral infarction without residual deficits: Secondary | ICD-10-CM | POA: Insufficient documentation

## 2017-08-01 DIAGNOSIS — R0789 Other chest pain: Secondary | ICD-10-CM | POA: Diagnosis not present

## 2017-08-01 DIAGNOSIS — Z7901 Long term (current) use of anticoagulants: Secondary | ICD-10-CM | POA: Insufficient documentation

## 2017-08-01 DIAGNOSIS — R109 Unspecified abdominal pain: Secondary | ICD-10-CM | POA: Diagnosis not present

## 2017-08-01 DIAGNOSIS — I10 Essential (primary) hypertension: Secondary | ICD-10-CM | POA: Insufficient documentation

## 2017-08-01 DIAGNOSIS — Z79899 Other long term (current) drug therapy: Secondary | ICD-10-CM | POA: Insufficient documentation

## 2017-08-01 DIAGNOSIS — R079 Chest pain, unspecified: Secondary | ICD-10-CM | POA: Diagnosis not present

## 2017-08-01 DIAGNOSIS — I2699 Other pulmonary embolism without acute cor pulmonale: Secondary | ICD-10-CM | POA: Diagnosis not present

## 2017-08-01 DIAGNOSIS — Z955 Presence of coronary angioplasty implant and graft: Secondary | ICD-10-CM | POA: Insufficient documentation

## 2017-08-01 DIAGNOSIS — R110 Nausea: Secondary | ICD-10-CM | POA: Insufficient documentation

## 2017-08-01 DIAGNOSIS — Z950 Presence of cardiac pacemaker: Secondary | ICD-10-CM | POA: Insufficient documentation

## 2017-08-01 LAB — TROPONIN I: Troponin I: <0.03 ng/mL (ref <0.03)

## 2017-08-01 LAB — COMPREHENSIVE METABOLIC PANEL
ALBUMIN: 4.3 g/dL (ref 3.5–5.0)
ALT: 18 U/L (ref 17–63)
AST: 25 U/L (ref 15–41)
Alkaline Phosphatase: 85 U/L (ref 38–126)
Anion gap: 9 (ref 5–15)
BUN: 10 mg/dL (ref 6–20)
CHLORIDE: 108 mmol/L (ref 101–111)
CO2: 26 mmol/L (ref 22–32)
Calcium: 9.2 mg/dL (ref 8.9–10.3)
Creatinine, Ser: 0.93 mg/dL (ref 0.61–1.24)
GFR calc Af Amer: >60 mL/min (ref >60)
GFR calc non Af Amer: >60 mL/min (ref >60)
GLUCOSE: 98 mg/dL (ref 65–99)
POTASSIUM: 3.5 mmol/L (ref 3.5–5.1)
SODIUM: 143 mmol/L (ref 135–145)
Total Bilirubin: 0.7 mg/dL (ref 0.3–1.2)
Total Protein: 7.7 g/dL (ref 6.5–8.1)

## 2017-08-01 LAB — CBC WITH DIFFERENTIAL/PLATELET
Basophils Absolute: 0.1 10*3/uL (ref 0.0–0.1)
Basophils Relative: 1 %
EOS PCT: 4 %
Eosinophils Absolute: 0.4 10*3/uL (ref 0.0–0.7)
HEMATOCRIT: 39.9 % (ref 39.0–52.0)
HEMOGLOBIN: 13.3 g/dL (ref 13.0–17.0)
LYMPHS ABS: 1.8 10*3/uL (ref 0.7–4.0)
LYMPHS PCT: 20 %
MCH: 30.6 pg (ref 26.0–34.0)
MCHC: 33.3 g/dL (ref 30.0–36.0)
MCV: 91.9 fL (ref 78.0–100.0)
Monocytes Absolute: 0.6 10*3/uL (ref 0.1–1.0)
Monocytes Relative: 7 %
NEUTROS ABS: 6.1 10*3/uL (ref 1.7–7.7)
NEUTROS PCT: 68 %
Platelets: 226 10*3/uL (ref 150–400)
RBC: 4.34 MIL/uL (ref 4.22–5.81)
RDW: 14.6 % (ref 11.5–15.5)
WBC: 9 10*3/uL (ref 4.0–10.5)

## 2017-08-01 MED ORDER — POLYETHYLENE GLYCOL 3350 17 G PO PACK: 17.0000 g | PACK | Freq: Every day | ORAL | 0 refills | Status: AC

## 2017-08-01 MED ORDER — ONDANSETRON 4 MG PO TBDP
4.0000 mg | ORAL_TABLET | Freq: Once | ORAL | Status: AC
  Administered 2017-08-01: 4 mg via ORAL
  Filled 2017-08-01: qty 1

## 2017-08-01 MED ORDER — ONDANSETRON 4 MG PO TBDP: 4.0000 mg | ORAL_TABLET | Freq: Three times a day (TID) | ORAL | 0 refills | Status: DC | PRN

## 2017-08-01 MED ORDER — GI COCKTAIL ~~LOC~~
30.0000 mL | Freq: Once | ORAL | Status: AC
  Administered 2017-08-01: 30 mL via ORAL
  Filled 2017-08-01: qty 30

## 2017-08-01 MED ORDER — DICYCLOMINE HCL 20 MG PO TABS: 20.0000 mg | ORAL_TABLET | Freq: Two times a day (BID) | ORAL | 0 refills | Status: DC

## 2017-08-01 MED ORDER — SUCRALFATE 1 GM/10ML PO SUSP: 1.0000 g | Freq: Three times a day (TID) | ORAL | 0 refills | Status: DC

## 2017-08-01 MED FILL — CARAFATE 1 GM/10 ML SUSP: 1 | 11 days supply | Qty: 420 | Fill #0

## 2017-08-01 MED FILL — POLYETHYLENE GLYCOL 3350: 15 days supply | Qty: 255 | Fill #0

## 2017-08-01 MED FILL — DICYCLOMINE 20 MG TABLET: 20 | 10 days supply | Qty: 20 | Fill #0

## 2017-08-01 MED FILL — ONDANSETRON ODT 4 MG TABLET: 4 | 6 days supply | Qty: 20 | Fill #0

## 2017-08-01 NOTE — ED Provider Notes (Signed)
Emergency Department Provider Note   I have reviewed the triage vital signs and the nursing notes.   HISTORY  Chief Complaint Chest Pain   HPI Jason [name] is a 71 y.o. male with PMH of tachy/brady syndrome s/p pacemaker implantation, GERD, CAD, CVA, HLD, and HTN presents to the ED with constant chest pressure, nausea, and sensation that "everything is loose." Reports that he feels as if he were able to vomiting he would feel a lot better. Also notes episodes of feeling lightheaded but nothing worse than normal for him. No syncope. No heart palpitations. Chest pressure has been constant since yesterday. No associated dyspnea. The patient states he has the sensation that his abdomen is more distended and that seems to be putting pressure on his chest causing the pain. No fevers or chills. No productive cough. No sick contacts. He has been compliant with his medications. No dysuria, hesitancy, urgency.  Past Medical History:  Diagnosis Date  . Anemia 05/08/2015  . CML (chronic myelocytic leukemia) ([place]) 12/16/2011  . Coronary heart disease   . CVA (cerebrovascular accident) ([place])   . GERD (gastroesophageal reflux disease)   . Grief reaction 03/18/2014  . H/O tobacco use, presenting hazards to health 02/24/2012   No cigarettes since pacemaker placement. Encouraged ongoing efforts   . History of chicken pox   . History of kidney stones   . Hyperlipidemia   . Hypertension   . IBS (irritable bowel syndrome) 09/20/2013  . Medicare annual wellness visit, subsequent 03/23/2014  . PAF (paroxysmal atrial fibrillation) ([place])   . Symptomatic bradycardia    a. symptomatic bradycardia due to sinus node dysfunction s/p RA and RV Medtronic pacemaker 04/15/16    Patient Active Problem List   Diagnosis Date Noted  . Tachy-brady syndrome ([place]) 04/16/2016  . Anemia 05/08/2015  . Abdominal aortic aneurysm ([place]) 03/19/2015  . Dizziness 10/13/2014  . Esophageal reflux 10/13/2014  . Bruit 08/07/2014   . Chest pain with moderate risk of acute coronary syndrome 07/15/2014  . Chronic anticoagulation 07/15/2014  . History of CVA (cerebrovascular accident) 07/15/2014  . Medicare annual wellness visit, subsequent 03/23/2014  . Grief reaction 03/18/2014  . IBS (irritable bowel syndrome) 09/20/2013  . Atrial fibrillation ([place]) 09/09/2012  . Hypokalemia 09/09/2012  . Nausea 07/06/2012  . Hyperlipidemia, mixed 02/24/2012  . CAD- prior stents 2000 by Dr Elonda Husky in [facility] 02/24/2012  . H/O tobacco use, presenting hazards to health 02/24/2012  . HTN (hypertension) 02/24/2012  . CML (chronic myelocytic leukemia) ([place]) 12/16/2011    Past Surgical History:  Procedure Laterality Date  . CATARACT EXTRACTION    . CHOLECYSTECTOMY  2003  . CORONARY ANGIOPLASTY WITH STENT PLACEMENT  2000  . EP IMPLANTABLE DEVICE N/A 04/15/2016   Procedure: Pacemaker Implant;  Surgeon: Evans Lance, MD; Medtronic (serial number [id number]) pacemaker; Laterality: Left    Current Outpatient Rx  . Order #: [id number] Class: Historical Med  . Order #: [id number] Class: Normal  . Order #: [id number] Class: Normal  . Order #: [id number] Class: Print  . Order #: [id number] Class: Normal  . Order #: [id number] Class: Normal  . Order #: [id number] Class: Normal  . Order #: [id number] Class: Normal  . Order #: [id number] Class: Normal  . Order #: [id number] Class: Normal  . Order #: [id number] Class: Normal  . Order #: [id number] Class: Normal  . Order #: [id number] Class: Print  . Order #: [id number] Class: Normal  . Order #: [id number] Class: Print  . Order #: [id number] Class: Historical Med  . Order #: [id number] Class: Normal  . Order #:  [id number] Class: Print  . Order #: [id number] Class: Normal    Allergies Patient has no known allergies.  Family History  Problem Relation Age of Onset  . Arthritis Mother   . Hypertension Mother   . Heart disease Mother        Rheumatic fever  . Rheumatic fever Mother   . Other Mother        brain tumor  .  Cancer Mother        leukemia  . Hypertension Father   . Alcohol abuse Father   . Diabetes Son   . Colon cancer Neg Hx   . Breast cancer Neg Hx   . Prostate cancer Neg Hx     Social History Social History  Substance Use Topics  . Smoking status: Former Smoker    Packs/day: 0.25    Years: 40.00    Types: Cigarettes    Start date: 10/30/1967    Quit date: 04/14/2016  . Smokeless tobacco: Never Used     Comment: 04-16 quit smoking  . Alcohol use No     Comment: Rare    Review of Systems  Constitutional: No fever/chills Eyes: No visual changes. ENT: No sore throat. Cardiovascular: Positive chest pain. Respiratory: Positive shortness of breath. Gastrointestinal: negative abdominal pain. Positive nausea, no vomiting.  No diarrhea.  No constipation. Genitourinary: Negative for dysuria. Musculoskeletal: Negative for back pain. Skin: Negative for rash. Neurological: Negative for headaches, focal weakness or numbness.  10-point ROS otherwise negative.  ____________________________________________   PHYSICAL EXAM:  VITAL SIGNS: ED Triage Vitals  Enc Vitals Group     BP 08/01/17 0953 (!) 154/95     Pulse Rate 08/01/17 0953 71     Resp 08/01/17 0953 18     Temp 08/01/17 0953 98.5 F (36.9 C)     Temp Source 08/01/17 0953 Oral     SpO2 08/01/17 0953 97 %     Weight 08/01/17 1003 195 lb (88.5 kg)     Height 08/01/17 1003 5\' 8"  (1.727 m)   Constitutional: Alert and oriented. Well appearing and in no acute distress. Eyes: Conjunctivae are normal. Head: Atraumatic. Nose: No congestion/rhinnorhea. Mouth/Throat: Mucous membranes are moist.  Oropharynx non-erythematous. Neck: No stridor. Cardiovascular: Normal rate, regular rhythm. Good peripheral circulation. Grossly normal heart sounds.   Respiratory: Normal respiratory effort.  No retractions. Lungs CTAB. Gastrointestinal: Soft and nontender. Moderate distention.  Musculoskeletal: No lower extremity tenderness. B/L  trace LE edema. No gross deformities of extremities. Neurologic:  Normal speech and language. No gross focal neurologic deficits are appreciated.  Skin:  Skin is warm, dry and intact. No rash noted. Psychiatric: Mood and affect are normal. Speech and behavior are normal.  ____________________________________________   LABS (all labs ordered are listed, but only abnormal results are displayed)  Labs Reviewed  COMPREHENSIVE METABOLIC PANEL  CBC WITH DIFFERENTIAL/PLATELET  TROPONIN I   ____________________________________________  EKG   EKG Interpretation  Date/Time:  Monday August 01 2017 09:59:41 EDT Ventricular Rate:  74 PR Interval:    QRS Duration: 145 QT Interval:  455 QTC Calculation: 505 R Axis:   -66 Text Interpretation:  Atrial-paced complexes Prolonged PR interval RBBB and LAFB Left ventricular hypertrophy No STEMI.  Confirmed by Nanda Quinton [phone]) on 08/01/2017 10:07:17 AM       ____________________________________________  RADIOLOGY  Dg Abdomen Acute W/chest  Result Date: 08/01/2017 CLINICAL DATA:  PE chest and abdominal pain. EXAM: DG ABDOMEN ACUTE W/ 1V CHEST COMPARISON:  Chest x-ray dated first 2017.  FINDINGS: There is no evidence of dilated bowel loops or free intraperitoneal air. No radiopaque calculi or other significant radiographic abnormality is seen. Cholecystectomy. Left chest wall AICD with leads terminating in the right atrium and right ventricle, unchanged. Heart size and mediastinal contours are within normal limits. Mild chronic interstitial thickening, similar to prior study. No focal consolidation, pleural effusion, or pneumothorax. IMPRESSION: 1. Negative abdominal radiographs. 2. Stable chronic interstitial prominence. No acute cardiopulmonary disease. Electronically Signed   By: Titus Dubin M.D.   On: 08/01/2017 11:00    ____________________________________________   PROCEDURES  Procedure(s) performed:    Procedures  None ____________________________________________   INITIAL IMPRESSION / ASSESSMENT AND PLAN / ED COURSE  Pertinent labs & imaging results that were available during my care of the patient were reviewed by me and considered in my medical decision making (see chart for details).  Patient presents to the emergency department for evaluation of chest pressure since yesterday. No pleuritic or exertional component to pain. No worsening near-syncope episodes. Patient feels some abdominal fullness and constipation with nausea which she feels the primary cause of the symptoms. No focal abdominal tenderness. He is well-appearing here. Plan for heart enzymes, labs, plain film of the abdomen and chest with reevaluation.  Labs unremarkable. EKG and CXR normal. Suspect intraabdominal pathology for symptoms. Plan for supportive care and symptom mgmt at home. With constant symptoms since yesterday no indication for trending troponin. Advised PCP and Cardiology follow up.   At this time, I do not feel there is any life-threatening condition present. I have reviewed and discussed all results (EKG, imaging, lab, urine as appropriate), exam findings with patient. I have reviewed nursing notes and appropriate previous records.  I feel the patient is safe to be discharged home without further emergent workup. Discussed usual and customary return precautions. Patient and family (if present) verbalize understanding and are comfortable with this plan.  Patient will follow-up with their primary care provider. If they do not have a primary care provider, information for follow-up has been provided to them. All questions have been answered.  ____________________________________________  FINAL CLINICAL IMPRESSION(S) / ED DIAGNOSES  Final diagnoses:  Chest pressure  Abdominal distension  Nausea     MEDICATIONS GIVEN DURING THIS VISIT:  Medications  ondansetron (ZOFRAN-ODT) disintegrating tablet 4 mg  (4 mg Oral Given 08/01/17 1138)  gi cocktail (Maalox,Lidocaine,Donnatal) (30 mLs Oral Given 08/01/17 1137)     NEW OUTPATIENT MEDICATIONS STARTED DURING THIS VISIT:  Discharge Medication List as of 08/01/2017 12:20 PM    START taking these medications   Details  dicyclomine (BENTYL) 20 MG tablet Take 1 tablet (20 mg total) by mouth 2 (two) times daily., Starting Mon 08/01/2017, Print    ondansetron (ZOFRAN ODT) 4 MG disintegrating tablet Take 1 tablet (4 mg total) by mouth every 8 (eight) hours as needed for nausea or vomiting., Starting Mon 08/01/2017, Print    polyethylene glycol (MIRALAX / GLYCOLAX) packet Take 17 g by mouth daily., Starting Mon 08/01/2017, Until Mon 08/08/2017, Print    sucralfate (CARAFATE) 1 GM/10ML suspension Take 10 mLs (1 g total) by mouth 4 (four) times daily -  with meals and at bedtime., Starting Mon 08/01/2017, Print          Note:  This document was prepared using Dragon voice recognition software and may include unintentional dictation errors.  Nanda Quinton, MD Emergency Medicine    Kimmerly Lora, [facility], MD 08/01/17 [phone]

## 2017-08-01 NOTE — ED Notes (Signed)
Center upper part of chest that started  2 days ago  Felt a little nauseas , has some sob , has a Water quality scientist

## 2017-08-01 NOTE — Discharge Instructions (Signed)

## 2017-08-01 NOTE — ED Triage Notes (Signed)
Patient reports chest pressure which began yesterday morning.  States he was sitting when this occurred.  States the pain has been intermittent and causes him to be short of breath as well as having nausea.

## 2017-08-01 NOTE — ED Notes (Signed)
Pt on monitor 

## 2017-08-02 ENCOUNTER — Telehealth: Payer: Self-pay | Admitting: Family Medicine

## 2017-08-02 NOTE — Telephone Encounter (Signed)
Called pt 08/02/17 to r/s AWV with Glenard Haring on 09/15/17.  Pt did not answer. Will try to call pt again at a later time.

## 2017-08-31 ENCOUNTER — Other Ambulatory Visit: Payer: Self-pay | Admitting: Family Medicine

## 2017-09-12 ENCOUNTER — Other Ambulatory Visit: Payer: Self-pay | Admitting: Cardiology

## 2017-09-13 ENCOUNTER — Telehealth: Payer: Self-pay | Admitting: Cardiology

## 2017-09-13 NOTE — Telephone Encounter (Signed)
Returned call to patient. He was trying to return a call to [place]. She scheduled him an appt to see MD on 12/21/17 (made this AM). Patient states he sees [name] in [facility]. Will route message to Sunday Spillers to reschedule

## 2017-09-13 NOTE — Telephone Encounter (Signed)
Rx(s) sent to pharmacy electronically.  

## 2017-09-13 NOTE — Telephone Encounter (Signed)
F/u message  Pt states he is returning a call from RN .please call back to discuss

## 2017-09-15 ENCOUNTER — Encounter: Payer: Self-pay | Admitting: Family Medicine

## 2017-09-15 ENCOUNTER — Telehealth: Payer: Self-pay

## 2017-09-15 ENCOUNTER — Ambulatory Visit (INDEPENDENT_AMBULATORY_CARE_PROVIDER_SITE_OTHER): Payer: Medicare HMO | Admitting: Family Medicine

## 2017-09-15 VITALS — BP 137/67 | HR 66 | Temp 97.6°F | Ht 68.0 in | Wt 207.0 lb

## 2017-09-15 DIAGNOSIS — I10 Essential (primary) hypertension: Secondary | ICD-10-CM

## 2017-09-15 DIAGNOSIS — Z23 Encounter for immunization: Secondary | ICD-10-CM | POA: Diagnosis not present

## 2017-09-15 DIAGNOSIS — Z0001 Encounter for general adult medical examination with abnormal findings: Secondary | ICD-10-CM

## 2017-09-15 DIAGNOSIS — E782 Mixed hyperlipidemia: Secondary | ICD-10-CM

## 2017-09-15 DIAGNOSIS — D649 Anemia, unspecified: Secondary | ICD-10-CM

## 2017-09-15 DIAGNOSIS — L578 Other skin changes due to chronic exposure to nonionizing radiation: Secondary | ICD-10-CM

## 2017-09-15 DIAGNOSIS — C9210 Chronic myeloid leukemia, BCR/ABL-positive, not having achieved remission: Secondary | ICD-10-CM

## 2017-09-15 DIAGNOSIS — H60393 Other infective otitis externa, bilateral: Secondary | ICD-10-CM

## 2017-09-15 DIAGNOSIS — Z0000 Encounter for general adult medical examination without abnormal findings: Secondary | ICD-10-CM

## 2017-09-15 DIAGNOSIS — I480 Paroxysmal atrial fibrillation: Secondary | ICD-10-CM

## 2017-09-15 LAB — COMPREHENSIVE METABOLIC PANEL
ALT: 11 U/L (ref 0–53)
AST: 13 U/L (ref 0–37)
Albumin: 4.1 g/dL (ref 3.5–5.2)
Alkaline Phosphatase: 72 U/L (ref 39–117)
BILIRUBIN TOTAL: 0.4 mg/dL (ref 0.2–1.2)
BUN: 10 mg/dL (ref 6–23)
CO2: 28 mEq/L (ref 19–32)
CREATININE: 1.11 mg/dL (ref 0.40–1.50)
Calcium: 9.3 mg/dL (ref 8.4–10.5)
Chloride: 109 mEq/L (ref 96–112)
GFR: 69.71 mL/min (ref >60.00)
GLUCOSE: 87 mg/dL (ref 70–99)
Potassium: 3.7 mEq/L (ref 3.5–5.1)
SODIUM: 142 meq/L (ref 135–145)
TOTAL PROTEIN: 7.2 g/dL (ref 6.0–8.3)

## 2017-09-15 LAB — LIPID PANEL
CHOL/HDL RATIO: 3
Cholesterol: 117 mg/dL (ref 0–200)
HDL: 41.8 mg/dL (ref >39.00)
LDL Cholesterol: 59 mg/dL (ref 0–99)
NONHDL: 75.3
Triglycerides: 80 mg/dL (ref 0.0–149.0)
VLDL: 16 mg/dL (ref 0.0–40.0)

## 2017-09-15 LAB — TSH: TSH: 2.88 u[IU]/mL (ref 0.35–4.50)

## 2017-09-15 MED ORDER — NEOMYCIN-COLIST-HC-THONZONIUM 3.3-3-10-0.5 MG/ML OT SUSP: 3.0000 [drp] | Freq: Three times a day (TID) | OTIC | 0 refills | Status: DC

## 2017-09-15 MED ORDER — OFLOXACIN 0.3 % OT SOLN: 10.0000 [drp] | Freq: Every day | OTIC | 0 refills | Status: DC

## 2017-09-15 NOTE — Assessment & Plan Note (Signed)
Increase leafy greens, consider increased lean red meat and using cast iron cookware. Continue to monitor, report any concerns 

## 2017-09-15 NOTE — Patient Instructions (Addendum)
Encouraged increased hydration and fiber in diet. Daily probiotics. If bowels not moving can use MOM 2 tbls po in 4 oz of warm prune juice by mouth every 2-3 days. If no results then repeat in 4 hours with  Dulcolax suppository pr, may repeat again in 4 more hours as needed. Seek care if symptoms worsen. Consider daily Miralax and/or Dulcolax if symptoms persist.  Consider taking a mix of Miralax and Benefiber together once or twice daily if constipation recurs  Shingrix is the new shingles shot, two shots over 6 months can get a pharmacy Preventive Care 65 Years and Older, Male Preventive care refers to lifestyle choices and visits with your health care provider that can promote health and wellness. What does preventive care include?  A yearly physical exam. This is also called an annual well check.  Dental exams once or twice a year.  Routine eye exams. Ask your health care provider how often you should have your eyes checked.  Personal lifestyle choices, including: ? Daily care of your teeth and gums. ? Regular physical activity. ? Eating a healthy diet. ? Avoiding tobacco and drug use. ? Limiting alcohol use. ? Practicing safe sex. ? Taking low doses of aspirin every day. ? Taking vitamin and mineral supplements as recommended by your health care provider. What happens during an annual well check? The services and screenings done by your health care provider during your annual well check will depend on your age, overall health, lifestyle risk factors, and family history of disease. Counseling Your health care provider may ask you questions about your:  Alcohol use.  Tobacco use.  Drug use.  Emotional well-being.  Home and relationship well-being.  Sexual activity.  Eating habits.  History of falls.  Memory and ability to understand (cognition).  Work and work Statistician.  Screening You may have the following tests or measurements:  Height, weight, and  BMI.  Blood pressure.  Lipid and cholesterol levels. These may be checked every 5 years, or more frequently if you are over 28 years old.  Skin check.  Lung cancer screening. You may have this screening every year starting at age 56 if you have a 30-pack-year history of smoking and currently smoke or have quit within the past 15 years.  Fecal occult blood test (FOBT) of the stool. You may have this test every year starting at age 44.  Flexible sigmoidoscopy or colonoscopy. You may have a sigmoidoscopy every 5 years or a colonoscopy every 10 years starting at age 63.  Prostate cancer screening. Recommendations will vary depending on your family history and other risks.  Hepatitis C blood test.  Hepatitis B blood test.  Sexually transmitted disease (STD) testing.  Diabetes screening. This is done by checking your blood sugar (glucose) after you have not eaten for a while (fasting). You may have this done every 1-3 years.  Abdominal aortic aneurysm (AAA) screening. You may need this if you are a current or former smoker.  Osteoporosis. You may be screened starting at age 73 if you are at high risk.  Talk with your health care provider about your test results, treatment options, and if necessary, the need for more tests. Vaccines Your health care provider may recommend certain vaccines, such as:  Influenza vaccine. This is recommended every year.  Tetanus, diphtheria, and acellular pertussis (Tdap, Td) vaccine. You may need a Td booster every 10 years.  Varicella vaccine. You may need this if you have not been vaccinated.  Zoster  vaccine. You may need this after age 89.  Measles, mumps, and rubella (MMR) vaccine. You may need at least one dose of MMR if you were born in 1957 or later. You may also need a second dose.  Pneumococcal 13-valent conjugate (PCV13) vaccine. One dose is recommended after age 60.  Pneumococcal polysaccharide (PPSV23) vaccine. One dose is recommended  after age 66.  Meningococcal vaccine. You may need this if you have certain conditions.  Hepatitis A vaccine. You may need this if you have certain conditions or if you travel or work in places where you may be exposed to hepatitis A.  Hepatitis B vaccine. You may need this if you have certain conditions or if you travel or work in places where you may be exposed to hepatitis B.  Haemophilus influenzae type b (Hib) vaccine. You may need this if you have certain risk factors.  Talk to your health care provider about which screenings and vaccines you need and how often you need them. This information is not intended to replace advice given to you by your health care provider. Make sure you discuss any questions you have with your health care provider. Document Released: 01/09/2016 Document Revised: 09/01/2016 Document Reviewed: 10/14/2015 Elsevier Interactive Patient Education  2017 [employer].

## 2017-09-15 NOTE — Assessment & Plan Note (Signed)
Encouraged heart healthy diet, increase exercise, avoid trans fats, consider a krill oil cap daily 

## 2017-09-15 NOTE — Assessment & Plan Note (Signed)
Follows with oncology and doing well

## 2017-09-15 NOTE — Assessment & Plan Note (Signed)
Well controlled, no changes to meds. Encouraged heart healthy diet such as the DASH diet and exercise as tolerated.  °

## 2017-09-15 NOTE — Assessment & Plan Note (Signed)
Rate controlled and tolerating Xarelto 

## 2017-09-15 NOTE — Telephone Encounter (Signed)
Cortisporin-otic not covered, Dr. Charlett Blake out of office. Per Dr. Etter Sjogren- Floxin otic 0.3 % 10 drops into affected ear (both ears in Pts case) daily for 7 days. Rx sent to [facility].

## 2017-09-17 ENCOUNTER — Other Ambulatory Visit: Payer: Self-pay | Admitting: Family Medicine

## 2017-09-18 DIAGNOSIS — L578 Other skin changes due to chronic exposure to nonionizing radiation: Secondary | ICD-10-CM | POA: Insufficient documentation

## 2017-09-18 DIAGNOSIS — H6090 Unspecified otitis externa, unspecified ear: Secondary | ICD-10-CM | POA: Insufficient documentation

## 2017-09-18 NOTE — Assessment & Plan Note (Signed)
Referred to dermatology for further consideration.  

## 2017-09-18 NOTE — Assessment & Plan Note (Signed)
folxin Otic prn

## 2017-09-18 NOTE — Progress Notes (Signed)
Patient ID: Jason [name], male   DOB: 1946/10/08, 71 y.o.   MRN: [id number]   Subjective:    Patient ID: Jason [name], male    DOB: 03-03-1946, 71 y.o.   MRN: [id number]  Chief Complaint  Patient presents with  . Annual Exam    HPI Patient is in today for annual preventative exam and follow up on chronic medical concerns. He notes some flaking from his ears and itching at times. No recent febrile illness or recent hospitalization. Is trying to maintain a heart healthy diet. Stays active. Denies CP/palp/SOB/HA/congestion/fevers/GI or GU c/o. Taking meds as prescribed. Doing well with ADLs at home.   Past Medical History:  Diagnosis Date  . Anemia 05/08/2015  . CML (chronic myelocytic leukemia) ([place]) 12/16/2011  . Coronary heart disease   . CVA (cerebrovascular accident) ([place])   . GERD (gastroesophageal reflux disease)   . Grief reaction 03/18/2014  . H/O tobacco use, presenting hazards to health 02/24/2012   No cigarettes since pacemaker placement. Encouraged ongoing efforts   . History of chicken pox   . History of kidney stones   . Hyperlipidemia   . Hypertension   . IBS (irritable bowel syndrome) 09/20/2013  . Medicare annual wellness visit, subsequent 03/23/2014  . PAF (paroxysmal atrial fibrillation) ([place])   . Symptomatic bradycardia    a. symptomatic bradycardia due to sinus node dysfunction s/p RA and RV Medtronic pacemaker 04/15/16    Past Surgical History:  Procedure Laterality Date  . CATARACT EXTRACTION    . CHOLECYSTECTOMY  2003  . CORONARY ANGIOPLASTY WITH STENT PLACEMENT  2000  . EP IMPLANTABLE DEVICE N/A 04/15/2016   Procedure: Pacemaker Implant;  Surgeon: Evans Lance, MD; Medtronic (serial number [id number]) pacemaker; Laterality: Left    Family History  Problem Relation Age of Onset  . Arthritis Mother   . Hypertension Mother   . Heart disease Mother        Rheumatic fever  . Rheumatic fever Mother   . Other Mother        brain tumor  . Cancer Mother          leukemia  . Hypertension Father   . Alcohol abuse Father   . Diabetes Son   . Colon cancer Neg Hx   . Breast cancer Neg Hx   . Prostate cancer Neg Hx     Social History   Social History  . Marital status: Widowed    Spouse name: N/A  . Number of children: 3  . Years of education: N/A   Occupational History  . Retired     Retired   Social History Main Topics  . Smoking status: Former Smoker    Packs/day: 0.25    Years: 40.00    Types: Cigarettes    Start date: 10/30/1967    Quit date: 04/14/2016  . Smokeless tobacco: Never Used     Comment: 04-16 quit smoking  . Alcohol use No     Comment: Rare  . Drug use: No  . Sexual activity: No     Comment: wife died in 01-11-2023 married 9 years, grandson livew with patient   Other Topics Concern  . Not on file   Social History Narrative  . No narrative on file    Outpatient Medications Prior to Visit  Medication Sig Dispense Refill  . acetaminophen (TYLENOL) 325 MG tablet Take 650 mg by mouth every 6 (six) hours as needed. For headache.    Marland Kitchen amLODipine (NORVASC) 5  MG tablet TAKE 1 TABLET EVERY DAY (NEED MD APPOINTMENT) 90 tablet 3  . atorvastatin (LIPITOR) 40 MG tablet TAKE 1 TABLET EVERY DAY   90 tablet 3  . dicyclomine (BENTYL) 20 MG tablet Take 1 tablet (20 mg total) by mouth 2 (two) times daily. 20 tablet 0  . ferrous sulfate 325 (65 FE) MG EC tablet Take 1 tablet (325 mg total) by mouth 2 (two) times daily. 60 tablet 11  . fluticasone (FLONASE) 50 MCG/ACT nasal spray USE 2 SPRAYS IN EACH NOSTRIL EVERY DAY 48 g 3  . [name] 100 MG tablet TAKE TWO TABLETS BY MOUTH ONCE DAILY WITH MEALS AND  A  LARGE  GLASS  OF  WATER 180 tablet 3  . losartan (COZAAR) 100 MG tablet TAKE 1 TABLET EVERY DAY 90 tablet 0  . meclizine (ANTIVERT) 25 MG tablet TAKE 1 TABLET THREE TIMES DAILY 90 tablet 0  . metoprolol (LOPRESSOR) 50 MG tablet Take 1 tablet (50 mg total) by mouth 2 (two) times daily. 180 tablet 1  . nitroGLYCERIN (NITROSTAT) 0.4  MG SL tablet Place 1 tablet (0.4 mg total) under the tongue every 5 (five) minutes as needed for chest pain. Please schedule appointment for refills. 25 tablet 0  . omeprazole (PRILOSEC) 40 MG capsule TAKE 1 CAPSULE (40 MG TOTAL) BY MOUTH DAILY. 90 capsule 3  . ondansetron (ZOFRAN ODT) 4 MG disintegrating tablet Take 1 tablet (4 mg total) by mouth every 8 (eight) hours as needed for nausea or vomiting. 20 tablet 0  . ondansetron (ZOFRAN) 4 MG tablet TAKE 1 TABLET BY MOUTH EVERY 8 HOURS AS  NEEDED  FOR  NAUSEA  AND  VOMITING 40 tablet 1  . Probiotic Product (PROBIOTIC FORMULA PO) Take 1 capsule by mouth daily.     . ranitidine (ZANTAC) 300 MG tablet Take 1 tablet (300 mg total) by mouth at bedtime. 90 tablet 3  . sucralfate (CARAFATE) 1 GM/10ML suspension Take 10 mLs (1 g total) by mouth 4 (four) times daily -  with meals and at bedtime. 420 mL 0  . XARELTO 20 MG TABS tablet TAKE 1 TABLET (20 MG TOTAL) BY MOUTH DAILY WITH SUPPER. NEED MD APPOINTMENT FOR REFILLS 90 tablet 1   No facility-administered medications prior to visit.     No Known Allergies  Review of Systems  Constitutional: Negative for chills, fever and malaise/fatigue.  HENT: Positive for ear pain. Negative for congestion, ear discharge, hearing loss and tinnitus.   Eyes: Negative for discharge.  Respiratory: Negative for cough, sputum production and shortness of breath.   Cardiovascular: Negative for chest pain, palpitations and leg swelling.  Gastrointestinal: Negative for abdominal pain, blood in stool, constipation, diarrhea, heartburn, nausea and vomiting.  Genitourinary: Negative for dysuria, frequency, hematuria and urgency.  Musculoskeletal: Negative for back pain, falls and myalgias.  Skin: Negative for rash.  Neurological: Negative for dizziness, sensory change, loss of consciousness, weakness and headaches.  Endo/Heme/Allergies: Negative for environmental allergies. Does not bruise/bleed easily.    Psychiatric/Behavioral: Negative for depression and suicidal ideas. The patient is not nervous/anxious and does not have insomnia.        Objective:    Physical Exam  Constitutional: He is oriented to person, place, and time. He appears well-developed and well-nourished. No distress.  HENT:  Head: Normocephalic and atraumatic.  Skin in b/l ear canals erythematous and flakey  Eyes: Conjunctivae are normal.  Neck: Neck supple. No thyromegaly present.  Cardiovascular: Normal rate, regular rhythm and normal heart  sounds.   No murmur heard. Pulmonary/Chest: Effort normal and breath sounds normal. No respiratory distress. He has no wheezes.  Abdominal: Soft. Bowel sounds are normal. He exhibits no mass. There is no tenderness.  Musculoskeletal: He exhibits no edema.  Lymphadenopathy:    He has no cervical adenopathy.  Neurological: He is alert and oriented to person, place, and time.  Skin: Skin is warm and dry.  Psychiatric: He has a normal mood and affect. His behavior is normal.    BP 137/67   Pulse 66   Temp 97.6 F (36.4 C) (Oral)   Ht '5\' 8"'$  (1.727 m)   Wt 207 lb (93.9 kg)   SpO2 95%   BMI 31.47 kg/m  Wt Readings from Last 3 Encounters:  09/15/17 207 lb (93.9 kg)  08/01/17 195 lb (88.5 kg)  07/01/17 198 lb (89.8 kg)     Lab Results  Component Value Date   WBC 9.0 08/01/2017   HGB 13.3 08/01/2017   HCT 39.9 08/01/2017   PLT 226 08/01/2017   GLUCOSE 87 09/15/2017   CHOL 117 09/15/2017   TRIG 80.0 09/15/2017   HDL 41.80 09/15/2017   LDLCALC 59 09/15/2017   ALT 11 09/15/2017   AST 13 09/15/2017   NA 142 09/15/2017   K 3.7 09/15/2017   CL 109 09/15/2017   CREATININE 1.11 09/15/2017   BUN 10 09/15/2017   CO2 28 09/15/2017   TSH 2.88 09/15/2017   INR 1.07 06/27/2014    Lab Results  Component Value Date   TSH 2.88 09/15/2017   Lab Results  Component Value Date   WBC 9.0 08/01/2017   HGB 13.3 08/01/2017   HCT 39.9 08/01/2017   MCV 91.9 08/01/2017    PLT 226 08/01/2017   Lab Results  Component Value Date   NA 142 09/15/2017   K 3.7 09/15/2017   CHLORIDE 108 11/26/2016   CO2 28 09/15/2017   GLUCOSE 87 09/15/2017   BUN 10 09/15/2017   CREATININE 1.11 09/15/2017   BILITOT 0.4 09/15/2017   ALKPHOS 72 09/15/2017   AST 13 09/15/2017   ALT 11 09/15/2017   PROT 7.2 09/15/2017   ALBUMIN 4.1 09/15/2017   CALCIUM 9.3 09/15/2017   ANIONGAP 9 08/01/2017   EGFR 74 (L) 11/26/2016   GFR 69.71 09/15/2017   Lab Results  Component Value Date   CHOL 117 09/15/2017   Lab Results  Component Value Date   HDL 41.80 09/15/2017   Lab Results  Component Value Date   LDLCALC 59 09/15/2017   Lab Results  Component Value Date   TRIG 80.0 09/15/2017   Lab Results  Component Value Date   CHOLHDL 3 09/15/2017   No results found for: HGBA1C     Assessment & Plan:   Problem List Items Addressed This Visit    Preventative health care    Patient encouraged to maintain heart healthy diet, regular exercise, adequate sleep. Consider daily probiotics. Take medications as prescribed. Given Pneumovax today labs ordered and reviewed      CML (chronic myelocytic leukemia) ([place]) (Chronic)    Follows with oncology and doing well      HTN (hypertension) (Chronic)    Well controlled, no changes to meds. Encouraged heart healthy diet such as the DASH diet and exercise as tolerated.       Relevant Orders   Comprehensive metabolic panel (Completed)   TSH (Completed)   Hyperlipidemia, mixed    Encouraged heart healthy diet, increase exercise, avoid trans fats,  consider a krill oil cap daily      Relevant Orders   Lipid panel (Completed)   Atrial fibrillation (HCC)    Rate controlled and tolerating Xarelto      Anemia    Increase leafy greens, consider increased lean red meat and using cast iron cookware. Continue to monitor, report any concerns      Sun-damaged skin    Referred to dermatology for further consideration      Relevant  Orders   Ambulatory referral to Dermatology   Otitis externa - Primary    folxin Otic prn       Other Visit Diagnoses    Need for pneumococcal vaccination       Relevant Orders   Pneumococcal polysaccharide vaccine 23-valent greater than or equal to 2yo subcutaneous/IM (Completed)      I am having Mr. Helmstetter maintain his Probiotic Product (PROBIOTIC FORMULA PO), acetaminophen, ferrous sulfate, nitroGLYCERIN, meclizine, ranitidine, metoprolol tartrate, losartan, fluticasone, omeprazole, atorvastatin, [name], XARELTO, dicyclomine, ondansetron, sucralfate, ondansetron, and amLODipine.  Meds ordered this encounter  Medications  . DISCONTD: neomycin-colistin-hydrocortisone-thonzonium (CORTISPORIN-TC) 3.02-26-09-0.5 MG/ML OTIC suspension    Sig: Place 3 drops into both ears 3 (three) times daily.    Dispense:  10 mL    Refill:  0     Penni Homans, MD

## 2017-09-18 NOTE — Assessment & Plan Note (Signed)
Patient encouraged to maintain heart healthy diet, regular exercise, adequate sleep. Consider daily probiotics. Take medications as prescribed. Given Pneumovax today labs ordered and reviewed

## 2017-09-28 ENCOUNTER — Encounter: Payer: Self-pay | Admitting: Hematology & Oncology

## 2017-09-28 NOTE — Progress Notes (Unsigned)
°  Faxed medical records to: [employer] P: [phone] Req ID: [id number] [place]: 12/28/2015-Present

## 2017-11-10 NOTE — Progress Notes (Signed)
HPI: FU coronary artery disease and atrial fibrillation. Patient had stents placed at [facility] in 2000. Records are not available. Patient seen preoperatively at [facility] on September 01 2012 and electrocardiogram showed atrial fibrillation. TSH in September of 2013 was 1.612. Patient has been maintained on anticoagulation. Monitor April 2016 showed paroxysmal atrial fibrillation and Toprol increased. Echocardiogram repeated April 2017 and showed an ejection fraction of 40-45%, mild to moderate aortic insufficiency and mildly dilated aortic root. Abdominal ultrasound August 2017 showed abdominal aortic aneurysm measuring 3.9 cm. Nuclear study April 2017 showed ejection fraction 43%, inferior and lateral infarct but no ischemia. Has had pacemaker placed secondary to tachybradycardia syndrome. Seen with atypical chest pain August 2018. Troponin normal. Since last seen,the patient has dyspnea with more extreme activities but not with routine activities. It is relieved with rest. It is not associated with chest pain. There is no orthopnea, PND or pedal edema. There is no syncope or palpitations. There is no exertional chest pain.   Current Outpatient Medications  Medication Sig Dispense Refill  . acetaminophen (TYLENOL) 325 MG tablet Take 650 mg by mouth every 6 (six) hours as needed. For headache.    Marland Kitchen amLODipine (NORVASC) 5 MG tablet TAKE 1 TABLET EVERY DAY (NEED MD APPOINTMENT) 90 tablet 3  . atorvastatin (LIPITOR) 40 MG tablet TAKE 1 TABLET EVERY DAY   90 tablet 3  . dicyclomine (BENTYL) 20 MG tablet Take 1 tablet (20 mg total) by mouth 2 (two) times daily. 20 tablet 0  . ferrous sulfate 325 (65 FE) MG EC tablet Take 1 tablet (325 mg total) by mouth 2 (two) times daily. 60 tablet 11  . fluticasone (FLONASE) 50 MCG/ACT nasal spray USE 2 SPRAYS IN EACH NOSTRIL EVERY DAY 48 g 3  . [name] 100 MG tablet TAKE TWO TABLETS BY MOUTH ONCE DAILY WITH MEALS AND  A  LARGE  GLASS  OF   WATER 180 tablet 3  . losartan (COZAAR) 100 MG tablet TAKE 1 TABLET EVERY DAY 90 tablet 0  . meclizine (ANTIVERT) 25 MG tablet TAKE 1 TABLET THREE TIMES DAILY 90 tablet 0  . metoprolol (LOPRESSOR) 50 MG tablet Take 1 tablet (50 mg total) by mouth 2 (two) times daily. 180 tablet 1  . nitroGLYCERIN (NITROSTAT) 0.4 MG SL tablet Place 1 tablet (0.4 mg total) under the tongue every 5 (five) minutes as needed for chest pain. Please schedule appointment for refills. 25 tablet 0  . ofloxacin (FLOXIN OTIC) 0.3 % OTIC solution Place 10 drops into both ears daily. For 7 days 10 mL 0  . omeprazole (PRILOSEC) 40 MG capsule TAKE 1 CAPSULE (40 MG TOTAL) BY MOUTH DAILY. 90 capsule 3  . ondansetron (ZOFRAN ODT) 4 MG disintegrating tablet Take 1 tablet (4 mg total) by mouth every 8 (eight) hours as needed for nausea or vomiting. 20 tablet 0  . Probiotic Product (PROBIOTIC FORMULA PO) Take 1 capsule by mouth daily.     . ranitidine (ZANTAC) 300 MG tablet Take 1 tablet (300 mg total) by mouth at bedtime. 90 tablet 3  . XARELTO 20 MG TABS tablet TAKE 1 TABLET (20 MG TOTAL) BY MOUTH DAILY WITH SUPPER. NEED MD APPOINTMENT FOR REFILLS 90 tablet 1   No current facility-administered medications for this visit.      Past Medical History:  Diagnosis Date  . Anemia 05/08/2015  . CML (chronic myelocytic leukemia) ([place]) 12/16/2011  . Coronary heart disease   . CVA (cerebrovascular  accident) ([place])   . GERD (gastroesophageal reflux disease)   . Grief reaction 03/18/2014  . H/O tobacco use, presenting hazards to health 02/24/2012   No cigarettes since pacemaker placement. Encouraged ongoing efforts   . History of chicken pox   . History of kidney stones   . Hyperlipidemia   . Hypertension   . IBS (irritable bowel syndrome) 09/20/2013  . Medicare annual wellness visit, subsequent 03/23/2014  . PAF (paroxysmal atrial fibrillation) ([place])   . Symptomatic bradycardia    a. symptomatic bradycardia due to sinus node dysfunction  s/p RA and RV Medtronic pacemaker 04/15/16    Past Surgical History:  Procedure Laterality Date  . CATARACT EXTRACTION    . CHOLECYSTECTOMY  2003  . CORONARY ANGIOPLASTY WITH STENT PLACEMENT  2000  . EP IMPLANTABLE DEVICE N/A 04/15/2016   Procedure: Pacemaker Implant;  Surgeon: Evans Lance, MD; Medtronic (serial number [id number]) pacemaker; Laterality: Left    Social History   Socioeconomic History  . Marital status: Widowed    Spouse name: Not on file  . Number of children: 3  . Years of education: Not on file  . Highest education level: Not on file  Social Needs  . Financial resource strain: Not on file  . Food insecurity - worry: Not on file  . Food insecurity - inability: Not on file  . Transportation needs - medical: Not on file  . Transportation needs - non-medical: Not on file  Occupational History  . Occupation: Retired    Comment: Retired  Immunologist  . Smoking status: Former Smoker    Packs/day: 0.25    Years: 40.00    Pack years: 10.00    Types: Cigarettes    Start date: 10/30/1967    Last attempt to quit: 04/14/2016    Years since quitting: 1.6  . Smokeless tobacco: Never Used  . Tobacco comment: 04-16 quit smoking  Substance and Sexual Activity  . Alcohol use: No    Alcohol/week: 0.0 oz    Comment: Rare  . Drug use: No  . Sexual activity: No    Comment: wife died in 01/27/2023 married 59 years, grandson livew with patient  Other Topics Concern  . Not on file  Social History Narrative  . Not on file    Family History  Problem Relation Age of Onset  . Arthritis Mother   . Hypertension Mother   . Heart disease Mother        Rheumatic fever  . Rheumatic fever Mother   . Other Mother        brain tumor  . Cancer Mother        leukemia  . Hypertension Father   . Alcohol abuse Father   . Diabetes Son   . Colon cancer Neg Hx   . Breast cancer Neg Hx   . Prostate cancer Neg Hx     ROS: no fevers or chills, productive cough, hemoptysis, dysphasia,  odynophagia, melena, hematochezia, dysuria, hematuria, rash, seizure activity, orthopnea, PND, pedal edema, claudication. Remaining systems are negative.  Physical Exam: Well-developed well-nourished in no acute distress.  Skin is warm and dry.  HEENT is normal.  Neck is supple.  Chest is clear to auscultation with normal expansion.  Cardiovascular exam is regular rate and rhythm.  Abdominal exam nontender or distended. No masses palpated. Extremities show no edema. neuro grossly intact   A/P  1 coronary artery disease-plan to continue medical therapy. Continue statin. No aspirin given need for anticoagulation.  2 hypertension-blood pressure is controlled. Continue present medications.  3 hyperlipidemia-continue statin.   4 paroxysmal atrial fibrillation-patient in sinus rhythm today. Continue beta blocker for rate control if atrial fibrillation recurs.Continue xarelto.   5 Tobacco abuse-patient discontinued previously.  6 prior pacemaker-follow-up electrophysiology.  7 abdominal aortic aneurysm-schedule follow-up abdominal ultrasound.  Kirk Ruths, MD

## 2017-11-11 ENCOUNTER — Encounter: Payer: Self-pay | Admitting: Cardiology

## 2017-11-22 ENCOUNTER — Other Ambulatory Visit: Payer: Self-pay | Admitting: Family Medicine

## 2017-11-23 ENCOUNTER — Ambulatory Visit (INDEPENDENT_AMBULATORY_CARE_PROVIDER_SITE_OTHER): Payer: Medicare HMO | Admitting: Cardiology

## 2017-11-23 ENCOUNTER — Encounter: Payer: Self-pay | Admitting: Cardiology

## 2017-11-23 VITALS — BP 131/78 | HR 79 | Ht 68.0 in | Wt 206.8 lb

## 2017-11-23 DIAGNOSIS — I480 Paroxysmal atrial fibrillation: Secondary | ICD-10-CM | POA: Diagnosis not present

## 2017-11-23 DIAGNOSIS — I10 Essential (primary) hypertension: Secondary | ICD-10-CM

## 2017-11-23 DIAGNOSIS — I2510 Atherosclerotic heart disease of native coronary artery without angina pectoris: Secondary | ICD-10-CM | POA: Diagnosis not present

## 2017-11-23 DIAGNOSIS — E7800 Pure hypercholesterolemia, unspecified: Secondary | ICD-10-CM | POA: Diagnosis not present

## 2017-11-23 DIAGNOSIS — I714 Abdominal aortic aneurysm, without rupture, unspecified: Secondary | ICD-10-CM

## 2017-11-23 NOTE — Patient Instructions (Signed)
Medication Instructions:   NO CHANGE  Testing/Procedures:  Your physician has requested that you have an abdominal aorta duplex. During this test, an ultrasound is used to evaluate the aorta. Allow 30 minutes for this exam. Do not eat after midnight the day before and avoid carbonated beverages   Follow-Up:  Your physician wants you to follow-up in: [place] will receive a reminder letter in the mail two months in advance. If you don't receive a letter, please call our office to schedule the follow-up appointment.   DR CAMNITZ IN [place] FOR DEVICE CHECK

## 2017-11-24 ENCOUNTER — Ambulatory Visit (HOSPITAL_BASED_OUTPATIENT_CLINIC_OR_DEPARTMENT_OTHER)
Admission: RE | Admit: 2017-11-24 | Discharge: 2017-11-24 | Disposition: A | Discharge status: Home / Self Care | Payer: Medicare HMO | Source: Ambulatory Visit | Attending: Cardiology | Admitting: Cardiology

## 2017-11-24 DIAGNOSIS — I714 Abdominal aortic aneurysm, without rupture, unspecified: Secondary | ICD-10-CM

## 2017-11-25 ENCOUNTER — Other Ambulatory Visit: Payer: Self-pay | Admitting: Family Medicine

## 2017-11-28 ENCOUNTER — Other Ambulatory Visit: Payer: Self-pay

## 2017-11-28 ENCOUNTER — Other Ambulatory Visit: Payer: Self-pay | Admitting: Cardiology

## 2017-12-02 ENCOUNTER — Other Ambulatory Visit: Payer: Self-pay

## 2017-12-02 ENCOUNTER — Encounter: Payer: Self-pay | Admitting: Hematology & Oncology

## 2017-12-02 ENCOUNTER — Other Ambulatory Visit (HOSPITAL_BASED_OUTPATIENT_CLINIC_OR_DEPARTMENT_OTHER): Payer: Medicare HMO

## 2017-12-02 ENCOUNTER — Ambulatory Visit (HOSPITAL_BASED_OUTPATIENT_CLINIC_OR_DEPARTMENT_OTHER): Payer: Medicare HMO | Admitting: Hematology & Oncology

## 2017-12-02 VITALS — BP 161/91 | HR 60 | Temp 98.2°F | Resp 16 | Wt 202.0 lb

## 2017-12-02 DIAGNOSIS — C9210 Chronic myeloid leukemia, BCR/ABL-positive, not having achieved remission: Secondary | ICD-10-CM | POA: Diagnosis not present

## 2017-12-02 LAB — CBC WITH DIFFERENTIAL (CANCER CENTER ONLY)
BASO#: 0.1 10*3/uL (ref 0.0–0.2)
BASO%: 1.1 % (ref 0.0–2.0)
EOS ABS: 0.5 10*3/uL (ref 0.0–0.5)
EOS%: 5.6 % (ref 0.0–7.0)
HEMATOCRIT: 38.1 % — AB (ref 38.7–49.9)
HGB: 12.7 g/dL — ABNORMAL LOW (ref 13.0–17.1)
LYMPH#: 2 10*3/uL (ref 0.9–3.3)
LYMPH%: 20.8 % (ref 14.0–48.0)
MCH: 31.4 pg (ref 28.0–33.4)
MCHC: 33.3 g/dL (ref 32.0–35.9)
MCV: 94 fL (ref 82–98)
MONO#: 1 10*3/uL — AB (ref 0.1–0.9)
MONO%: 10.5 % (ref 0.0–13.0)
NEUT#: 5.8 10*3/uL (ref 1.5–6.5)
NEUT%: 62 % (ref 40.0–80.0)
PLATELETS: 265 10*3/uL (ref 145–400)
RBC: 4.04 10*6/uL — AB (ref 4.20–5.70)
RDW: 14 % (ref 11.1–15.7)
WBC: 9.4 10*3/uL (ref 4.0–10.0)

## 2017-12-02 NOTE — Progress Notes (Signed)
Hematology and Oncology Follow Up Visit  Jason [name] [id number] 1946/11/13 71 y.o. 12/02/2017   Principle Diagnosis:  Chronic phase CML - MMR Paroxysmal atrial fibrillation - has pacemaker   Current Therapy:   [name] 200 mg by mouth daily Xarelto 20 mg by mouth daily   Interim History:  Jason [name] is here today for follow-up.  We last saw him back in the summertime.  He had no problems over the summer.  Thankfully, his pacemaker is doing well for him.  He has had no cardiac issues.  His last BCR/ABL ratio was 0.04%.  As such, he is still in a major molecular remission (MMR).  He had a nice Thanksgiving.  He does not eat all that much for fear of gallbladder issues.  He has had no problems with nausea or vomiting.  He has had no change in bowel or bladder habits.  He has had no edema.  He has had no rashes.  He has had no headache.  Overall, his performance status is ECOG 1.   Medications:  Allergies as of 12/02/2017   No Known Allergies     Medication List        Accurate as of 12/02/17 10:45 AM. Always use your most recent med list.          acetaminophen 325 MG tablet Commonly known as:  TYLENOL Take 650 mg by mouth every 6 (six) hours as needed. For headache.   amLODipine 5 MG tablet Commonly known as:  NORVASC TAKE 1 TABLET EVERY DAY (NEED MD APPOINTMENT)   atorvastatin 40 MG tablet Commonly known as:  LIPITOR TAKE 1 TABLET EVERY DAY   dicyclomine 20 MG tablet Commonly known as:  BENTYL Take 1 tablet (20 mg total) by mouth 2 (two) times daily.   ferrous sulfate 325 (65 FE) MG EC tablet Take 1 tablet (325 mg total) by mouth 2 (two) times daily.   fluticasone 50 MCG/ACT nasal spray Commonly known as:  FLONASE USE 2 SPRAYS IN EACH NOSTRIL EVERY DAY   [name] 100 MG tablet Generic drug:  imatinib TAKE TWO TABLETS BY MOUTH ONCE DAILY WITH MEALS AND  A  LARGE  GLASS  OF  WATER   losartan 100 MG tablet Commonly known as:  COZAAR TAKE 1 TABLET EVERY  DAY   meclizine 25 MG tablet Commonly known as:  ANTIVERT TAKE 1 TABLET THREE TIMES DAILY   metoprolol tartrate 50 MG tablet Commonly known as:  LOPRESSOR Take 1 tablet (50 mg total) by mouth 2 (two) times daily.   nitroGLYCERIN 0.4 MG SL tablet Commonly known as:  NITROSTAT Place 1 tablet (0.4 mg total) under the tongue every 5 (five) minutes as needed for chest pain. Please schedule appointment for refills.   ofloxacin 0.3 % OTIC solution Commonly known as:  FLOXIN OTIC Place 10 drops into both ears daily. For 7 days   omeprazole 40 MG capsule Commonly known as:  PRILOSEC TAKE 1 CAPSULE (40 MG TOTAL) BY MOUTH DAILY.   ondansetron 4 MG disintegrating tablet Commonly known as:  ZOFRAN ODT Take 1 tablet (4 mg total) by mouth every 8 (eight) hours as needed for nausea or vomiting.   PROBIOTIC FORMULA PO Take 1 capsule by mouth daily.   ranitidine 300 MG tablet Commonly known as:  ZANTAC Take 1 tablet (300 mg total) by mouth at bedtime.   XARELTO 20 MG Tabs tablet Generic drug:  rivaroxaban TAKE 1 TABLET DAILY WITH SUPPER. NEED MD APPOINTMENT FOR REFILLS  Allergies: No Known Allergies  Past Medical History, Surgical history, Social history, and Family History were reviewed and updated.  Review of Systems: As stated in the interim history  Physical Exam:  weight is 202 lb (91.6 kg). His oral temperature is 98.2 F (36.8 C). His blood pressure is 161/91 (abnormal) and his pulse is 60. His respiration is 16 and oxygen saturation is 97%.   Wt Readings from Last 3 Encounters:  12/02/17 202 lb (91.6 kg)  11/23/17 206 lb 12.8 oz (93.8 kg)  09/15/17 207 lb (93.9 kg)    Physical Exam  Constitutional: He is oriented to person, place, and time.  HENT:  Head: Normocephalic and atraumatic.  Mouth/Throat: Oropharynx is clear and moist.  Eyes: EOM are normal. Pupils are equal, round, and reactive to light.  Neck: Normal range of motion.  Cardiovascular: Normal rate,  regular rhythm and normal heart sounds.  Pulmonary/Chest: Effort normal and breath sounds normal.  Abdominal: Soft. Bowel sounds are normal.  Musculoskeletal: Normal range of motion. He exhibits no edema, tenderness or deformity.  Lymphadenopathy:    He has no cervical adenopathy.  Neurological: He is alert and oriented to person, place, and time.  Skin: Skin is warm and dry. No rash noted. No erythema.  Psychiatric: He has a normal mood and affect. His behavior is normal. Judgment and thought content normal.  Vitals reviewed.    Lab Results  Component Value Date   WBC 9.4 12/02/2017   HGB 12.7 (L) 12/02/2017   HCT 38.1 (L) 12/02/2017   MCV 94 12/02/2017   PLT 265 12/02/2017   Lab Results  Component Value Date   IRON 84 05/27/2015   TIBC 359 05/27/2015   UIBC 275 05/27/2015   IRONPCTSAT 23 05/27/2015   Lab Results  Component Value Date   RETICCTPCT 1.2 05/27/2015   RBC 4.04 (L) 12/02/2017   RETICCTABS 48.2 05/27/2015   No results found for: KPAFRELGTCHN, LAMBDASER, KAPLAMBRATIO No results found for: Kandis Cocking, IGMSERUM No results found for: Odetta Pink, SPEI   Chemistry      Component Value Date/Time   NA 142 09/15/2017 1214   NA 143 11/26/2016 1030   K 3.7 09/15/2017 1214   K 3.8 11/26/2016 1030   CL 109 09/15/2017 1214   CL 109 (H) 03/31/2015 1119   CO2 28 09/15/2017 1214   CO2 25 11/26/2016 1030   BUN 10 09/15/2017 1214   BUN 5.1 (L) 11/26/2016 1030   CREATININE 1.11 09/15/2017 1214   CREATININE 1.0 11/26/2016 1030      Component Value Date/Time   CALCIUM 9.3 09/15/2017 1214   CALCIUM 9.3 11/26/2016 1030   ALKPHOS 72 09/15/2017 1214   ALKPHOS 106 11/26/2016 1030   AST 13 09/15/2017 1214   AST 16 11/26/2016 1030   ALT 11 09/15/2017 1214   ALT 10 11/26/2016 1030   BILITOT 0.4 09/15/2017 1214   BILITOT 0.53 11/26/2016 1030      Impression and Plan: Jason [name] is a very pleasant 71 yo caucasian  male with chronic phase CML.  He is in a major molecular remission.  He is doing well with [name].  He is not having any toxicity.  We will get him back in 6 more months.  This would be be reasonable.  Volanda Napoleon, MD 12/7/201810:45 AM

## 2017-12-03 ENCOUNTER — Other Ambulatory Visit: Payer: Self-pay | Admitting: Family Medicine

## 2017-12-05 ENCOUNTER — Other Ambulatory Visit: Payer: Self-pay | Admitting: Family Medicine

## 2017-12-09 ENCOUNTER — Encounter: Payer: Self-pay | Admitting: Hematology & Oncology

## 2017-12-21 ENCOUNTER — Telehealth: Payer: Self-pay | Admitting: *Deleted

## 2017-12-21 ENCOUNTER — Ambulatory Visit: Payer: Medicare HMO | Admitting: Cardiology

## 2017-12-21 DIAGNOSIS — R001 Bradycardia, unspecified: Secondary | ICD-10-CM | POA: Insufficient documentation

## 2017-12-21 DIAGNOSIS — K219 Gastro-esophageal reflux disease without esophagitis: Secondary | ICD-10-CM | POA: Insufficient documentation

## 2017-12-21 DIAGNOSIS — I639 Cerebral infarction, unspecified: Secondary | ICD-10-CM | POA: Insufficient documentation

## 2017-12-21 DIAGNOSIS — I10 Essential (primary) hypertension: Secondary | ICD-10-CM | POA: Insufficient documentation

## 2017-12-21 DIAGNOSIS — Z8619 Personal history of other infectious and parasitic diseases: Secondary | ICD-10-CM | POA: Insufficient documentation

## 2017-12-21 DIAGNOSIS — E785 Hyperlipidemia, unspecified: Secondary | ICD-10-CM | POA: Insufficient documentation

## 2017-12-21 DIAGNOSIS — I480 Paroxysmal atrial fibrillation: Secondary | ICD-10-CM | POA: Insufficient documentation

## 2017-12-21 DIAGNOSIS — Z87442 Personal history of urinary calculi: Secondary | ICD-10-CM | POA: Insufficient documentation

## 2017-12-21 NOTE — Telephone Encounter (Addendum)
Patient is aware of results  ----- Message from Volanda Napoleon, MD sent at 12/21/2017  2:56 PM EST ----- Call - your [facility] is still in remission!!  Laurey Arrow

## 2017-12-30 ENCOUNTER — Other Ambulatory Visit: Payer: Self-pay | Admitting: Family Medicine

## 2018-01-11 ENCOUNTER — Ambulatory Visit: Payer: Medicare HMO | Admitting: Cardiology

## 2018-01-11 ENCOUNTER — Telehealth: Payer: Self-pay

## 2018-01-11 NOTE — Telephone Encounter (Signed)
PA initiated via Covermymeds; KEY: [id number]. Awaiting determination.

## 2018-01-12 NOTE — Telephone Encounter (Signed)
PA approved until 11-01-2020. 

## 2018-01-13 ENCOUNTER — Other Ambulatory Visit: Payer: Self-pay | Admitting: *Deleted

## 2018-01-13 DIAGNOSIS — C9210 Chronic myeloid leukemia, BCR/ABL-positive, not having achieved remission: Secondary | ICD-10-CM

## 2018-01-13 MED ORDER — IMATINIB MESYLATE 100 MG PO TABS: ORAL_TABLET | ORAL | 3 refills | Status: DC

## 2018-01-23 ENCOUNTER — Other Ambulatory Visit: Payer: Self-pay | Admitting: Family Medicine

## 2018-02-01 ENCOUNTER — Ambulatory Visit (INDEPENDENT_AMBULATORY_CARE_PROVIDER_SITE_OTHER): Payer: Medicare HMO | Admitting: Cardiology

## 2018-02-01 ENCOUNTER — Encounter: Payer: Self-pay | Admitting: Cardiology

## 2018-02-01 VITALS — BP 95/65 | HR 85 | Ht 68.0 in | Wt 194.0 lb

## 2018-02-01 DIAGNOSIS — I10 Essential (primary) hypertension: Secondary | ICD-10-CM | POA: Diagnosis not present

## 2018-02-01 DIAGNOSIS — I2510 Atherosclerotic heart disease of native coronary artery without angina pectoris: Secondary | ICD-10-CM

## 2018-02-01 DIAGNOSIS — I495 Sick sinus syndrome: Secondary | ICD-10-CM | POA: Diagnosis not present

## 2018-02-01 DIAGNOSIS — I480 Paroxysmal atrial fibrillation: Secondary | ICD-10-CM | POA: Diagnosis not present

## 2018-02-01 DIAGNOSIS — E785 Hyperlipidemia, unspecified: Secondary | ICD-10-CM | POA: Diagnosis not present

## 2018-02-01 MED ORDER — AMIODARONE HCL 200 MG PO TABS: 200.0000 mg | ORAL_TABLET | Freq: Two times a day (BID) | ORAL | 0 refills | Status: DC

## 2018-02-01 MED ORDER — AMIODARONE HCL 200 MG PO TABS: 200.0000 mg | ORAL_TABLET | Freq: Every day | ORAL | 3 refills | Status: DC

## 2018-02-01 NOTE — Addendum Note (Signed)
Addended by: Stanton Kidney on: 02/01/2018 03:26 PM   Modules accepted: Orders

## 2018-02-01 NOTE — Progress Notes (Signed)
Electrophysiology Office Note   Date:  02/01/2018   ID:  Jason [name], DOB 1946/02/19, MRN [id number]  PCP:  Mosie Lukes, MD  Cardiologist:  Dennard Nip Primary Electrophysiologist:  Constance Haw, MD    Chief Complaint  Patient presents with  . Pacemaker Check    Tachycardia-Bradycardia syndrome/PAF     History of Present Illness: Jason [name] is a 72 y.o. male who is being seen today for the evaluation of atrial fibrillation at the request of Cren[facility]. Presenting today for electrophysiology evaluation.  He has a history of coronary artery disease, paroxysmal atrial fibrillation, tachybradycardia syndrome status post pacemaker, CLL, CVA, hypertension, hyperlipidemia, AAA.  He had a Medtronic dual-chamber pacemaker implanted 04/16/16.   Today, he denies symptoms of palpitations, chest pain, orthopnea, PND, lower extremity edema, claudication, dizziness, presyncope, syncope, bleeding, or neurologic sequela. The patient is tolerating medications without difficulties.  He does get episodic shortness of breath.  He says that this occurs on random events.  Device interrogation shows 18% atrial fibrillation.   Past Medical History:  Diagnosis Date  . Anemia 05/08/2015  . CML (chronic myelocytic leukemia) ([place]) 12/16/2011  . Coronary heart disease   . CVA (cerebrovascular accident) ([place])   . GERD (gastroesophageal reflux disease)   . Grief reaction 03/18/2014  . H/O tobacco use, presenting hazards to health 02/24/2012   No cigarettes since pacemaker placement. Encouraged ongoing efforts   . History of chicken pox   . History of kidney stones   . Hyperlipidemia   . Hypertension   . IBS (irritable bowel syndrome) 09/20/2013  . Medicare annual wellness visit, subsequent 03/23/2014  . PAF (paroxysmal atrial fibrillation) ([place])   . Symptomatic bradycardia    a. symptomatic bradycardia due to sinus node dysfunction s/p RA and RV Medtronic pacemaker 04/15/16   Past  Surgical History:  Procedure Laterality Date  . CATARACT EXTRACTION    . CHOLECYSTECTOMY  2003  . CORONARY ANGIOPLASTY WITH STENT PLACEMENT  2000  . EP IMPLANTABLE DEVICE N/A 04/15/2016   Procedure: Pacemaker Implant;  Surgeon: Evans Lance, MD; Medtronic (serial number [id number]) pacemaker; Laterality: Left     Current Outpatient Medications  Medication Sig Dispense Refill  . acetaminophen (TYLENOL) 325 MG tablet Take 650 mg by mouth every 6 (six) hours as needed. For headache.    Marland Kitchen amLODipine (NORVASC) 5 MG tablet TAKE 1 TABLET EVERY DAY (NEED MD APPOINTMENT) 90 tablet 3  . atorvastatin (LIPITOR) 40 MG tablet TAKE 1 TABLET EVERY DAY   90 tablet 3  . dicyclomine (BENTYL) 20 MG tablet Take 1 tablet (20 mg total) by mouth 2 (two) times daily. 20 tablet 0  . ferrous sulfate 325 (65 FE) MG EC tablet Take 1 tablet (325 mg total) by mouth 2 (two) times daily. 60 tablet 11  . fluticasone (FLONASE) 50 MCG/ACT nasal spray USE 2 SPRAYS IN EACH NOSTRIL EVERY DAY 48 g 3  . imatinib ([name]) 100 MG tablet TAKE 2 TABLETS BY MOUTH ONCE DAILY WITH MEALS AND  A  LARGE  GLASS  OF  WATER. Please dispense generic as insurance Akari Defelice not pay for brand. 180 tablet 3  . losartan (COZAAR) 100 MG tablet TAKE 1 TABLET EVERY DAY 90 tablet 0  . meclizine (ANTIVERT) 25 MG tablet TAKE 1 TABLET THREE TIMES DAILY 90 tablet 0  . metoprolol (LOPRESSOR) 50 MG tablet Take 1 tablet (50 mg total) by mouth 2 (two) times daily. 180 tablet 1  . nitroGLYCERIN (NITROSTAT) 0.4  MG SL tablet Place 1 tablet (0.4 mg total) under the tongue every 5 (five) minutes as needed for chest pain. Please schedule appointment for refills. 25 tablet 0  . ofloxacin (FLOXIN OTIC) 0.3 % OTIC solution Place 10 drops into both ears daily. For 7 days 10 mL 0  . omeprazole (PRILOSEC) 40 MG capsule TAKE 1 CAPSULE (40 MG TOTAL) BY MOUTH DAILY. 90 capsule 3  . ondansetron (ZOFRAN ODT) 4 MG disintegrating tablet Take 1 tablet (4 mg total) by mouth every 8  (eight) hours as needed for nausea or vomiting. 20 tablet 0  . ondansetron (ZOFRAN) 4 MG tablet Take 1 tablet (4 mg total) by mouth every 8 (eight) hours as needed for nausea or vomiting. 40 tablet 1  . Probiotic Product (PROBIOTIC FORMULA PO) Take 1 capsule by mouth daily.     . ranitidine (ZANTAC) 300 MG tablet TAKE 1 TABLET AT BEDTIME 90 tablet 3  . XARELTO 20 MG TABS tablet TAKE 1 TABLET DAILY WITH SUPPER. NEED MD APPOINTMENT FOR REFILLS 90 tablet 1   No current facility-administered medications for this visit.     Allergies:   Patient has no known allergies.   Social History:  The patient  reports that he quit smoking about 21 months ago. His smoking use included cigarettes. He started smoking about 50 years ago. He has a 10.00 pack-year smoking history. he has never used smokeless tobacco. He reports that he does not drink alcohol or use drugs.   Family History:  The patient's family history includes Alcohol abuse in his father; Arthritis in his mother; Cancer in his mother; Diabetes in his son; Heart disease in his mother; Hypertension in his father and mother; Other in his mother; Rheumatic fever in his mother.    ROS:  Please see the history of present illness.   Otherwise, review of systems is positive for shortness of breath.   All other systems are reviewed and negative.    PHYSICAL EXAM: VS:  BP 95/65   Pulse 85   Ht 5\' 8"  (1.727 m)   Wt 194 lb (88 kg)   BMI 29.50 kg/m  , BMI Body mass index is 29.5 kg/m. GEN: Well nourished, well developed, in no acute distress  HEENT: normal  Neck: no JVD, carotid bruits, or masses Cardiac: RRR; no murmurs, rubs, or gallops,no edema  Respiratory:  clear to auscultation bilaterally, normal work of breathing GI: soft, nontender, nondistended, + BS MS: no deformity or atrophy  Skin: warm and dry, device pocket is well healed Neuro:  Strength and sensation are intact Psych: euthymic mood, full affect  EKG:  EKG is not ordered  today. Personal review of the ekg ordered 08/01/17 shows atrial paced, first-degree AV block, right bundle left anterior fascicular block  Device interrogation is reviewed today in detail.  See PaceArt for details.   Recent Labs: 09/15/2017: ALT 11; BUN 10; Creatinine, Ser 1.11; Potassium 3.7; Sodium 142; TSH 2.88 12/02/2017: HGB 12.7; Platelets 265    Lipid Panel     Component Value Date/Time   CHOL 117 09/15/2017 1214   TRIG 80.0 09/15/2017 1214   HDL 41.80 09/15/2017 1214   CHOLHDL 3 09/15/2017 1214   VLDL 16.0 09/15/2017 1214   LDLCALC 59 09/15/2017 1214     Wt Readings from Last 3 Encounters:  02/01/18 194 lb (88 kg)  12/02/17 202 lb (91.6 kg)  11/23/17 206 lb 12.8 oz (93.8 kg)      Other studies Reviewed: Additional studies/  records that were reviewed today include: TTE 04/16/16  Review of the above records today demonstrates:  - Left ventricle: The cavity size was normal. Wall thickness was   normal. Systolic function was mildly to moderately reduced. The   estimated ejection fraction was in the range of 40% to 45%.   Diffuse hypokinesis. Doppler parameters are consistent with   abnormal left ventricular relaxation (grade 1 diastolic   dysfunction). - Aortic valve: Trileaflet; mildly calcified leaflets. There was   mild to moderate regurgitation. - Aorta: Mildly dilated aortic root. Dilated ascending aorta.   Ascending aorta: 44 mm. Aortic root dimension: 37 mm (ED). - Mitral valve: There was no significant regurgitation. - Right ventricle: The cavity size was normal. Pacer wire or   catheter noted in right ventricle. Systolic function was normal. - Pulmonary arteries: No complete TR doppler jet so unable to   estimate PA systolic pressure. - Inferior vena cava: The vessel was normal in size. The   respirophasic diameter changes were in the normal range (>= 50%),   consistent with normal central venous pressure.   ASSESSMENT AND PLAN:  1.  Tachybradycardia  syndrome: Status post Medtronic dual-chamber pacemaker implanted 2017.  Is functioning appropriately today.  No changes.  2.  Paroxysmal atrial fibrillation: Currently on Xarelto.  Per device interrogation, he is in atrial fibrillation at 18% of the time.  He does have rapid rates with his atrial fibrillation.  He has a coronary artery disease, and his ejection fraction is mildly decreased.  We Norrin Shreffler start him on amiodarone today.  If this makes a difference in his shortness of breath, may determine that he would benefit from [place] and loading down the road.  This patients CHA2DS2-VASc Score and unadjusted Ischemic Stroke Rate (% per year) is equal to 7.2 % stroke rate/year from a score of 5  Above score calculated as 1 point each if present [CHF, HTN, DM, Vascular=MI/PAD/Aortic Plaque, Age if 65-74, or Male] Above score calculated as 2 points each if present [Age > 75, or Stroke/TIA/TE]  3.  Coronary artery disease: Currently on aspirin, beta-blocker, statin.  No current chest pain.  4.  Hypertension: Blood pressure is low today.  This makes it difficult to titrate any rate control due to his fast episodes of atrial fibrillation.  Current medicines are reviewed at length with the patient today.   The patient does not have concerns regarding his medicines.  The following changes were made today: Amiodarone  Labs/ tests ordered today include:  No orders of the defined types were placed in this encounter.    Disposition:   FU with Miyanna Wiersma 6 months  Signed, Danaka Llera Meredith Leeds, MD  02/01/2018 2:55 PM     [facility] [address] [place] [address] [phone] (office) [phone] (fax)

## 2018-02-01 NOTE — Patient Instructions (Addendum)
Medication Instructions: Your physician has recommended you make the following change in your medication: 1. START Amiodarone  - take 200 mg twice daily for 1 month, then   - take 200 mg once daily  Labwork: None ordered  Procedures/Testing: None ordered  Follow-Up: Your physician wants you to follow-up in: 6 months with Dr. Curt Bears.  You will receive a reminder letter in the mail two months in advance. If you don't receive a letter, please call our office to schedule the follow-up appointment.   Any Additional Special Instructions Will Be Listed Below (If Applicable).  If you need a refill on your cardiac medications before your next appointment, please call your pharmacy.

## 2018-02-02 LAB — CUP PACEART INCLINIC DEVICE CHECK
Date Time Interrogation Session: 20190207113756
Implantable Lead Implant Date: 20170420
Implantable Lead Location: 753859
MDC IDC LEAD IMPLANT DT: 20170420
MDC IDC LEAD LOCATION: 753860
MDC IDC PG IMPLANT DT: 20170420

## 2018-02-17 ENCOUNTER — Other Ambulatory Visit: Payer: Self-pay | Admitting: Family Medicine

## 2018-02-20 ENCOUNTER — Other Ambulatory Visit: Payer: Self-pay | Admitting: *Deleted

## 2018-02-20 DIAGNOSIS — C9210 Chronic myeloid leukemia, BCR/ABL-positive, not having achieved remission: Secondary | ICD-10-CM

## 2018-02-20 MED ORDER — IMATINIB MESYLATE 100 MG PO TABS: ORAL_TABLET | ORAL | 3 refills | Status: DC

## 2018-03-08 ENCOUNTER — Other Ambulatory Visit: Payer: Self-pay | Admitting: Family Medicine

## 2018-03-08 ENCOUNTER — Other Ambulatory Visit: Payer: Self-pay | Admitting: Cardiology

## 2018-03-13 ENCOUNTER — Other Ambulatory Visit: Payer: Self-pay | Admitting: Family Medicine

## 2018-03-16 ENCOUNTER — Encounter: Payer: Self-pay | Admitting: Family Medicine

## 2018-03-16 ENCOUNTER — Ambulatory Visit (INDEPENDENT_AMBULATORY_CARE_PROVIDER_SITE_OTHER): Payer: Medicare HMO | Admitting: Family Medicine

## 2018-03-16 VITALS — BP 144/76 | HR 96 | Temp 98.0°F | Resp 16 | Ht 68.0 in | Wt 203.6 lb

## 2018-03-16 DIAGNOSIS — I2510 Atherosclerotic heart disease of native coronary artery without angina pectoris: Secondary | ICD-10-CM | POA: Diagnosis not present

## 2018-03-16 DIAGNOSIS — K625 Hemorrhage of anus and rectum: Secondary | ICD-10-CM | POA: Diagnosis not present

## 2018-03-16 DIAGNOSIS — C9210 Chronic myeloid leukemia, BCR/ABL-positive, not having achieved remission: Secondary | ICD-10-CM | POA: Diagnosis not present

## 2018-03-16 DIAGNOSIS — K219 Gastro-esophageal reflux disease without esophagitis: Secondary | ICD-10-CM

## 2018-03-16 DIAGNOSIS — I480 Paroxysmal atrial fibrillation: Secondary | ICD-10-CM

## 2018-03-16 DIAGNOSIS — E782 Mixed hyperlipidemia: Secondary | ICD-10-CM

## 2018-03-16 DIAGNOSIS — I2583 Coronary atherosclerosis due to lipid rich plaque: Secondary | ICD-10-CM | POA: Diagnosis not present

## 2018-03-16 DIAGNOSIS — I10 Essential (primary) hypertension: Secondary | ICD-10-CM

## 2018-03-16 LAB — CBC
HCT: 35 % — ABNORMAL LOW (ref 39.0–52.0)
Hemoglobin: 11.7 g/dL — ABNORMAL LOW (ref 13.0–17.0)
MCHC: 33.5 g/dL (ref 30.0–36.0)
MCV: 92.5 fl (ref 78.0–100.0)
PLATELETS: 332 10*3/uL (ref 150.0–400.0)
RBC: 3.79 Mil/uL — AB (ref 4.22–5.81)
RDW: 16 % — ABNORMAL HIGH (ref 11.5–15.5)
WBC: 8.2 10*3/uL (ref 4.0–10.5)

## 2018-03-16 LAB — TSH: TSH: 3.15 u[IU]/mL (ref 0.35–4.50)

## 2018-03-16 NOTE — Assessment & Plan Note (Signed)
Following with oncology tolerating [facility]

## 2018-03-16 NOTE — Patient Instructions (Signed)

## 2018-03-16 NOTE — Progress Notes (Signed)
Patient ID: Jason [name], male   DOB: 1946/05/14, 72 y.o.   MRN: [id number]   Subjective:    Patient ID: Jason [name], male    DOB: 14-Dec-1946, 72 y.o.   MRN: [id number]  Chief Complaint  Patient presents with  . Hypertension    here for follow up    HPI Patient is in today for follow up. He feels well today. No recent febrile illness or hospitalizations. Occasionally notes mild diarrhea or constipation but not presently. No bloody or tarry stool. He is trying to eat better. Has quit smoking. Does not notice a large appetite but eats well. Denies CP/palp/SOB/HA/congestion/fevers/GI or GU c/o. Taking meds as prescribed  Past Medical History:  Diagnosis Date  . Anemia 05/08/2015  . CML (chronic myelocytic leukemia) ([place]) 12/16/2011  . Coronary heart disease   . CVA (cerebrovascular accident) ([place])   . GERD (gastroesophageal reflux disease)   . Grief reaction 03/18/2014  . H/O tobacco use, presenting hazards to health 02/24/2012   No cigarettes since pacemaker placement. Encouraged ongoing efforts   . History of chicken pox   . History of kidney stones   . Hyperlipidemia   . Hypertension   . IBS (irritable bowel syndrome) 09/20/2013  . Medicare annual wellness visit, subsequent 03/23/2014  . PAF (paroxysmal atrial fibrillation) ([place])   . Symptomatic bradycardia    a. symptomatic bradycardia due to sinus node dysfunction s/p RA and RV Medtronic pacemaker 04/15/16    Past Surgical History:  Procedure Laterality Date  . CATARACT EXTRACTION    . CHOLECYSTECTOMY  2003  . CORONARY ANGIOPLASTY WITH STENT PLACEMENT  2000  . EP IMPLANTABLE DEVICE N/A 04/15/2016   Procedure: Pacemaker Implant;  Surgeon: Evans Lance, MD; Medtronic (serial number [id number]) pacemaker; Laterality: Left    Family History  Problem Relation Age of Onset  . Arthritis Mother   . Hypertension Mother   . Heart disease Mother        Rheumatic fever  . Rheumatic fever Mother   . Other Mother        brain  tumor  . Cancer Mother        leukemia  . Hypertension Father   . Alcohol abuse Father   . Diabetes Son   . Colon cancer Neg Hx   . Breast cancer Neg Hx   . Prostate cancer Neg Hx     Social History   Socioeconomic History  . Marital status: Widowed    Spouse name: Not on file  . Number of children: 3  . Years of education: Not on file  . Highest education level: Not on file  Occupational History  . Occupation: Retired    Comment: Retired  Scientific laboratory technician  . Financial resource strain: Not on file  . Food insecurity:    Worry: Not on file    Inability: Not on file  . Transportation needs:    Medical: Not on file    Non-medical: Not on file  Tobacco Use  . Smoking status: Former Smoker    Packs/day: 0.25    Years: 40.00    Pack years: 10.00    Types: Cigarettes    Start date: 10/30/1967    Last attempt to quit: 04/14/2016    Years since quitting: 1.9  . Smokeless tobacco: Never Used  . Tobacco comment: 04-16 quit smoking  Substance and Sexual Activity  . Alcohol use: No    Alcohol/week: 0.0 oz    Comment: Rare  . Drug  use: No  . Sexual activity: Never    Comment: wife died in 01-31-2023 married 2 years, grandson livew with patient  Lifestyle  . Physical activity:    Days per week: Not on file    Minutes per session: Not on file  . Stress: Not on file  Relationships  . Social connections:    Talks on phone: Not on file    Gets together: Not on file    Attends religious service: Not on file    Active member of club or organization: Not on file    Attends meetings of clubs or organizations: Not on file    Relationship status: Not on file  . Intimate partner violence:    Fear of current or ex partner: Not on file    Emotionally abused: Not on file    Physically abused: Not on file    Forced sexual activity: Not on file  Other Topics Concern  . Not on file  Social History Narrative  . Not on file    Outpatient Medications Prior to Visit  Medication Sig Dispense  Refill  . acetaminophen (TYLENOL) 325 MG tablet Take 650 mg by mouth every 6 (six) hours as needed. For headache.    Marland Kitchen amiodarone (PACERONE) 200 MG tablet Take 1 tablet (200 mg total) by mouth 2 (two) times daily. 60 tablet 0  . amiodarone (PACERONE) 200 MG tablet Take 1 tablet (200 mg total) by mouth daily. 90 tablet 3  . amLODipine (NORVASC) 5 MG tablet TAKE 1 TABLET EVERY DAY (NEED MD APPOINTMENT) 90 tablet 3  . atorvastatin (LIPITOR) 40 MG tablet TAKE 1 TABLET EVERY DAY   90 tablet 3  . dicyclomine (BENTYL) 20 MG tablet Take 1 tablet (20 mg total) by mouth 2 (two) times daily. 20 tablet 0  . ferrous sulfate 325 (65 FE) MG EC tablet Take 1 tablet (325 mg total) by mouth 2 (two) times daily. 60 tablet 11  . fluticasone (FLONASE) 50 MCG/ACT nasal spray USE 2 SPRAYS IN EACH NOSTRIL EVERY DAY 48 g 3  . imatinib ([name]) 100 MG tablet TAKE 2 TABLETS BY MOUTH ONCE DAILY WITH MEALS AND  A  LARGE  GLASS  OF  WATER. Please dispense generic as insurance will not pay for brand. 180 tablet 3  . losartan (COZAAR) 100 MG tablet TAKE 1 TABLET EVERY DAY 90 tablet 0  . meclizine (ANTIVERT) 25 MG tablet TAKE 1 TABLET THREE TIMES DAILY 90 tablet 0  . metoprolol tartrate (LOPRESSOR) 50 MG tablet TAKE 1 TABLET (50 MG TOTAL) BY MOUTH 2 (TWO) TIMES DAILY. 180 tablet 3  . nitroGLYCERIN (NITROSTAT) 0.4 MG SL tablet Place 1 tablet (0.4 mg total) under the tongue every 5 (five) minutes as needed for chest pain. Please schedule appointment for refills. 25 tablet 0  . ofloxacin (FLOXIN OTIC) 0.3 % OTIC solution Place 10 drops into both ears daily. For 7 days 10 mL 0  . omeprazole (PRILOSEC) 40 MG capsule TAKE 1 CAPSULE (40 MG TOTAL) BY MOUTH DAILY. 90 capsule 3  . ondansetron (ZOFRAN ODT) 4 MG disintegrating tablet Take 1 tablet (4 mg total) by mouth every 8 (eight) hours as needed for nausea or vomiting. 20 tablet 0  . ondansetron (ZOFRAN) 4 MG tablet TAKE 1 TABLET BY MOUTH EVERY 8 HOURS AS NEEDED FOR NAUSEA AND VOMITING  40 tablet 1  . Probiotic Product (PROBIOTIC FORMULA PO) Take 1 capsule by mouth daily.     . ranitidine (ZANTAC) 300 MG  tablet TAKE 1 TABLET AT BEDTIME 90 tablet 3  . XARELTO 20 MG TABS tablet TAKE 1 TABLET DAILY WITH SUPPER. NEED MD APPOINTMENT FOR REFILLS 90 tablet 1   No facility-administered medications prior to visit.     No Known Allergies  Review of Systems  Constitutional: Negative for fever and malaise/fatigue.  HENT: Negative for congestion.   Eyes: Negative for blurred vision.  Respiratory: Negative for shortness of breath.   Cardiovascular: Negative for chest pain, palpitations and leg swelling.  Gastrointestinal: Negative for abdominal pain, blood in stool and nausea.  Genitourinary: Negative for dysuria and frequency.  Musculoskeletal: Negative for falls.  Skin: Negative for rash.  Neurological: Negative for dizziness, loss of consciousness and headaches.  Endo/Heme/Allergies: Negative for environmental allergies.  Psychiatric/Behavioral: Negative for depression. The patient is not nervous/anxious.        Objective:    Physical Exam  Constitutional: He is oriented to person, place, and time. He appears well-developed and well-nourished. No distress.  HENT:  Head: Normocephalic and atraumatic.  Nose: Nose normal.  Eyes: Right eye exhibits no discharge. Left eye exhibits no discharge.  Neck: Normal range of motion. Neck supple.  Cardiovascular: Normal rate and regular rhythm.  No murmur heard. Pulmonary/Chest: Effort normal and breath sounds normal.  Abdominal: Soft. Bowel sounds are normal. There is no tenderness.  Musculoskeletal: He exhibits no edema.  Neurological: He is alert and oriented to person, place, and time.  Skin: Skin is warm and dry.  Psychiatric: He has a normal mood and affect.  Nursing note and vitals reviewed.   BP (!) 144/76 (BP Location: Left Arm, Patient Position: Sitting, Cuff Size: Large)   Pulse 96   Temp 98 F (36.7 C) (Oral)    Resp 16   Ht '5\' 8"'$  (1.727 m)   Wt 203 lb 9.6 oz (92.4 kg)   SpO2 94%   BMI 30.96 kg/m  Wt Readings from Last 3 Encounters:  03/16/18 203 lb 9.6 oz (92.4 kg)  02/01/18 194 lb (88 kg)  12/02/17 202 lb (91.6 kg)     Lab Results  Component Value Date   WBC 9.4 12/02/2017   HGB 12.7 (L) 12/02/2017   HCT 38.1 (L) 12/02/2017   PLT 265 12/02/2017   GLUCOSE 87 09/15/2017   CHOL 117 09/15/2017   TRIG 80.0 09/15/2017   HDL 41.80 09/15/2017   LDLCALC 59 09/15/2017   ALT 11 09/15/2017   AST 13 09/15/2017   NA 142 09/15/2017   K 3.7 09/15/2017   CL 109 09/15/2017   CREATININE 1.11 09/15/2017   BUN 10 09/15/2017   CO2 28 09/15/2017   TSH 2.88 09/15/2017   INR 1.07 06/27/2014    Lab Results  Component Value Date   TSH 2.88 09/15/2017   Lab Results  Component Value Date   WBC 9.4 12/02/2017   HGB 12.7 (L) 12/02/2017   HCT 38.1 (L) 12/02/2017   MCV 94 12/02/2017   PLT 265 12/02/2017   Lab Results  Component Value Date   NA 142 09/15/2017   K 3.7 09/15/2017   CHLORIDE 108 11/26/2016   CO2 28 09/15/2017   GLUCOSE 87 09/15/2017   BUN 10 09/15/2017   CREATININE 1.11 09/15/2017   BILITOT 0.4 09/15/2017   ALKPHOS 72 09/15/2017   AST 13 09/15/2017   ALT 11 09/15/2017   PROT 7.2 09/15/2017   ALBUMIN 4.1 09/15/2017   CALCIUM 9.3 09/15/2017   ANIONGAP 9 08/01/2017   EGFR 74 (L) 11/26/2016  GFR 69.71 09/15/2017   Lab Results  Component Value Date   CHOL 117 09/15/2017   Lab Results  Component Value Date   HDL 41.80 09/15/2017   Lab Results  Component Value Date   LDLCALC 59 09/15/2017   Lab Results  Component Value Date   TRIG 80.0 09/15/2017   Lab Results  Component Value Date   CHOLHDL 3 09/15/2017   No results found for: HGBA1C     Assessment & Plan:   Problem List Items Addressed This Visit    CML (chronic myelocytic leukemia) ([place]) (Chronic)    Following with oncology tolerating [name]      CAD- prior stents 2000 by Dr Elonda Husky in [place] (Chronic)      No concerning episodes.       Hyperlipidemia, mixed    Encouraged heart healthy diet, increase exercise, avoid trans fats, consider a krill oil cap daily      Relevant Orders   Lipid panel   PAF (paroxysmal atrial fibrillation) ([place])    Is doing well with pacemaker in place and he notes some discomfort over the pacemaker at times but he feels like it is the muscle in the area and no associated symptoms      Hypertension    Well controlled, no changes to meds. Encouraged heart healthy diet such as the DASH diet and exercise as tolerated.       Relevant Orders   CBC   Comprehensive metabolic panel   TSH   GERD (gastroesophageal reflux disease)    Avoid offending foods, start probiotics. Do not eat large meals in late evening and consider raising head of bed. Doing well on zantac but he does have some symptoms at times try Tums and let us know if it comes on more and more frequently         I am having Jason [name] maintain his Probiotic Product (PROBIOTIC FORMULA PO), acetaminophen, ferrous sulfate, nitroGLYCERIN, fluticasone, omeprazole, atorvastatin, dicyclomine, ondansetron, amLODipine, ofloxacin, XARELTO, ranitidine, losartan, amiodarone, amiodarone, imatinib, ondansetron, metoprolol tartrate, and meclizine.  No orders of the defined types were placed in this encounter.   CMA served as Education administrator during this visit. History, Physical and Plan performed by medical provider. Documentation and orders reviewed and attested to.  Penni Homans, MD

## 2018-03-16 NOTE — Assessment & Plan Note (Addendum)
Avoid offending foods, start probiotics. Do not eat large meals in late evening and consider raising head of bed. Doing well on zantac but he does have some symptoms at times try Tums and let us know if it comes on more and more frequently

## 2018-03-16 NOTE — Assessment & Plan Note (Signed)
Well controlled, no changes to meds. Encouraged heart healthy diet such as the DASH diet and exercise as tolerated.  °

## 2018-03-16 NOTE — Assessment & Plan Note (Signed)
No concerning episodes.

## 2018-03-16 NOTE — Assessment & Plan Note (Signed)
Is doing well with pacemaker in place and he notes some discomfort over the pacemaker at times but he feels like it is the muscle in the area and no associated symptoms

## 2018-03-16 NOTE — Assessment & Plan Note (Signed)
Encouraged heart healthy diet, increase exercise, avoid trans fats, consider a krill oil cap daily 

## 2018-03-17 LAB — COMPREHENSIVE METABOLIC PANEL
ALBUMIN: 3.9 g/dL (ref 3.5–5.2)
ALT: 9 U/L (ref 0–53)
AST: 14 U/L (ref 0–37)
Alkaline Phosphatase: 88 U/L (ref 39–117)
BILIRUBIN TOTAL: 0.2 mg/dL (ref 0.2–1.2)
BUN: 8 mg/dL (ref 6–23)
CALCIUM: 9.1 mg/dL (ref 8.4–10.5)
CHLORIDE: 109 meq/L (ref 96–112)
CO2: 25 mEq/L (ref 19–32)
CREATININE: 1.04 mg/dL (ref 0.40–1.50)
GFR: 75.04 mL/min (ref >60.00)
Glucose, Bld: 88 mg/dL (ref 70–99)
Potassium: 3.7 mEq/L (ref 3.5–5.1)
SODIUM: 144 meq/L (ref 135–145)
Total Protein: 7.2 g/dL (ref 6.0–8.3)

## 2018-03-17 LAB — LIPID PANEL
CHOLESTEROL: 113 mg/dL (ref 0–200)
HDL: 37.6 mg/dL — ABNORMAL LOW (ref >39.00)
LDL CALC: 59 mg/dL (ref 0–99)
NonHDL: 75.17
TRIGLYCERIDES: 81 mg/dL (ref 0.0–149.0)
Total CHOL/HDL Ratio: 3
VLDL: 16.2 mg/dL (ref 0.0–40.0)

## 2018-03-20 IMAGING — CT CT HEAD W/O CM
1 series · 16 of 30 positions shown, 20 images · non-contrast
Comparison: 07/04/2012

CLINICAL DATA: Numbness and tingling in both hands

EXAM:
CT HEAD WITHOUT CONTRAST
TECHNIQUE: Contiguous axial images were obtained from the base of the skull
through the vertex without intravenous contrast.

[Series 2: head wo · axial · 0.46mm/px · z∈[-98,+37]mm · 16 of 30 slices shown, 20 images]
[im 2/30  brain]
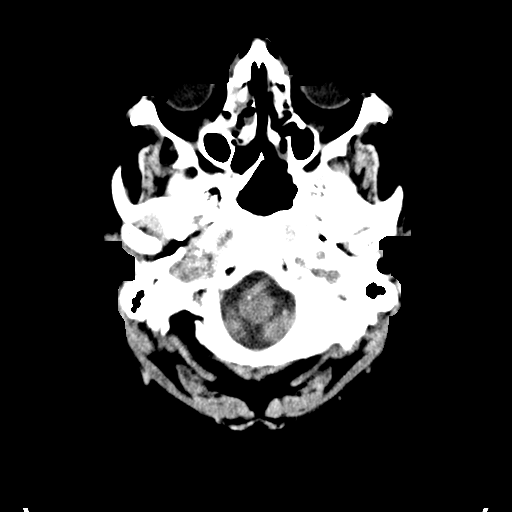
[im 2/30  bone]
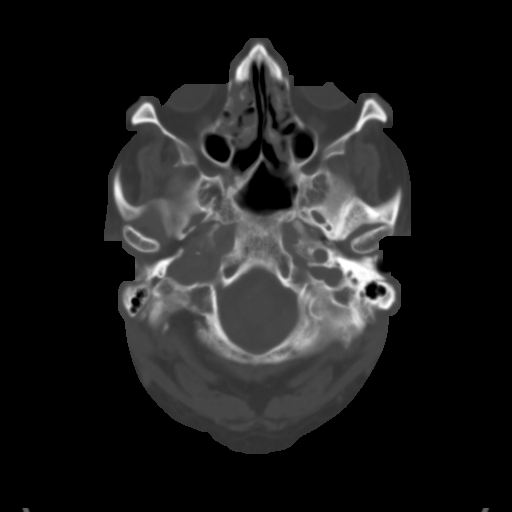
[im 4/30  brain]
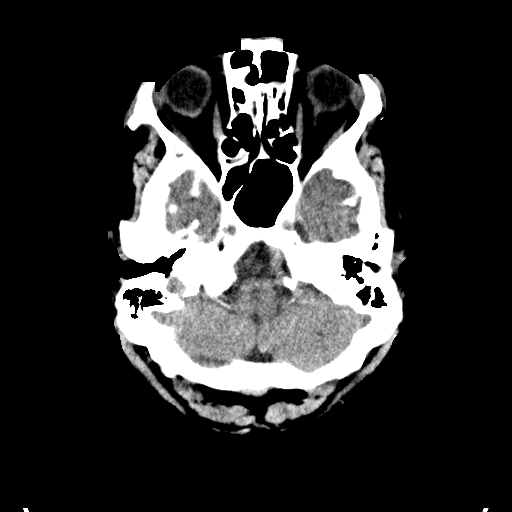
[im 6/30  brain]
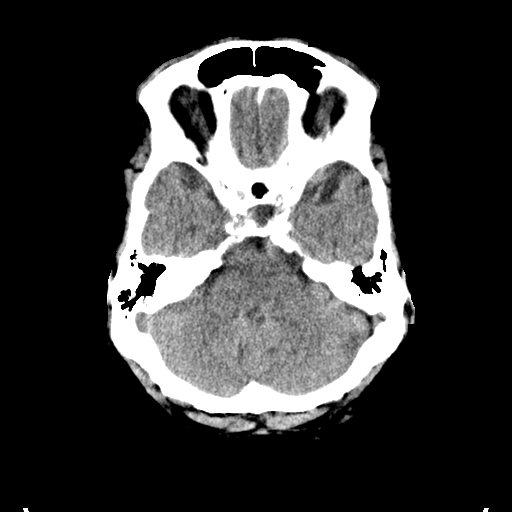
[im 8/30  brain]
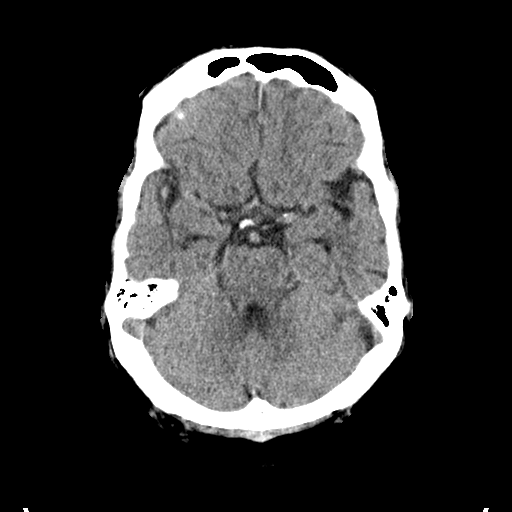
[im 9/30  brain]
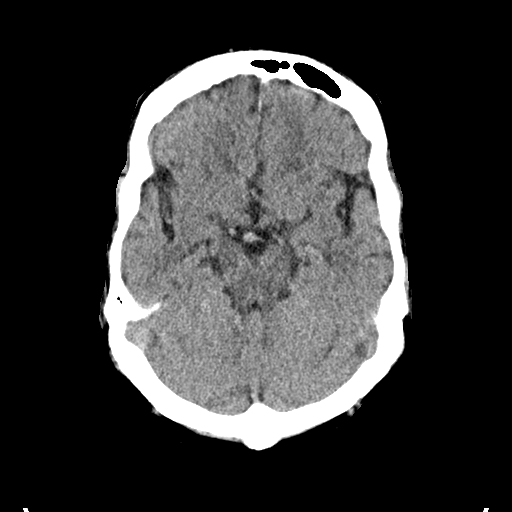
[im 9/30  bone]
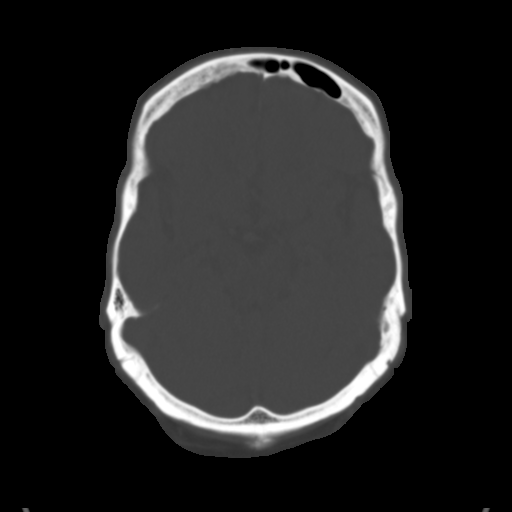
[im 11/30  brain]
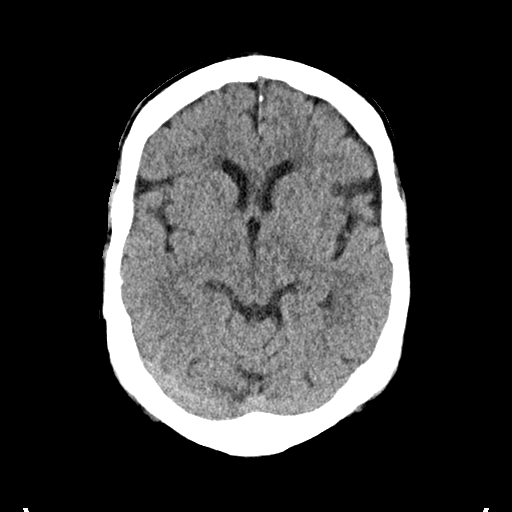
[im 13/30  brain]
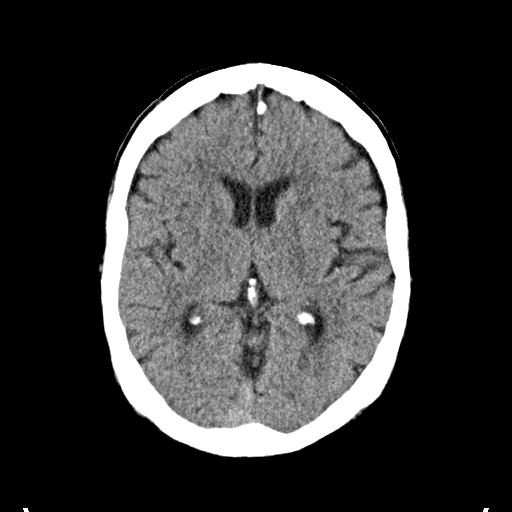
[im 15/30  brain]
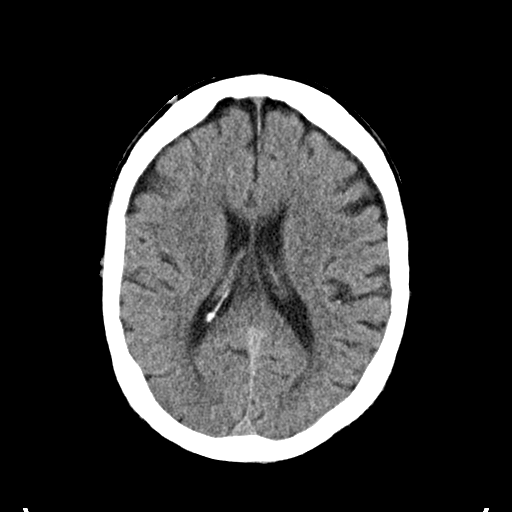
[im 16/30  brain]
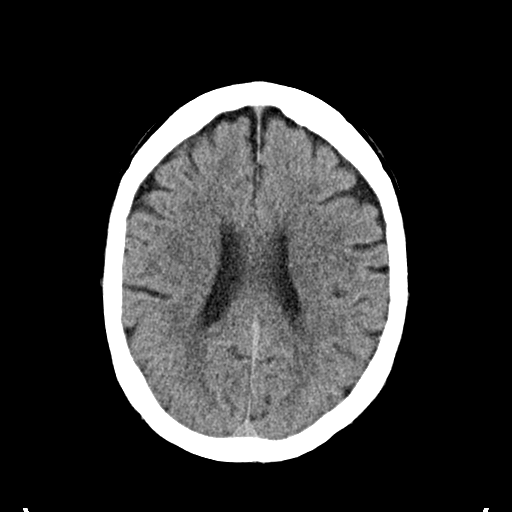
[im 16/30  bone]
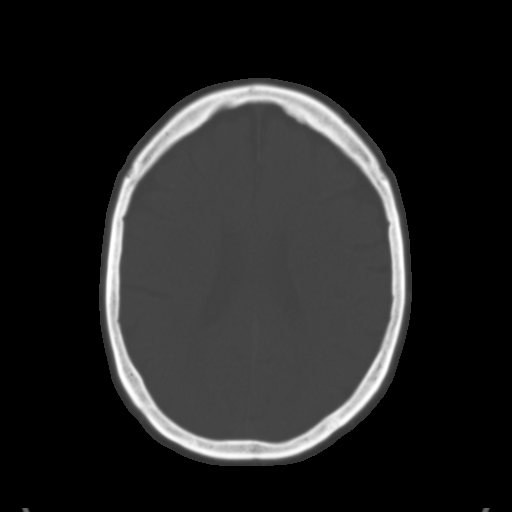
[im 18/30  brain]
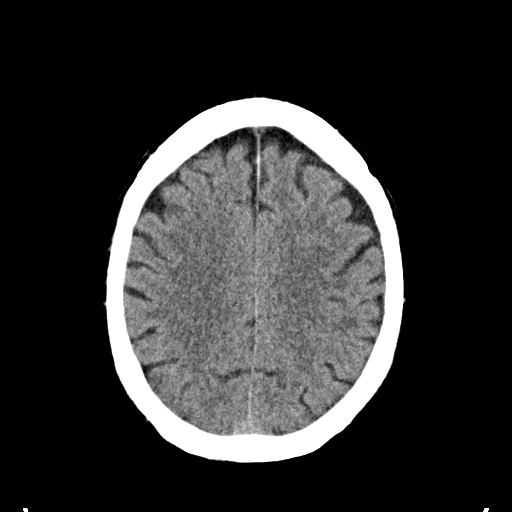
[im 20/30  brain]
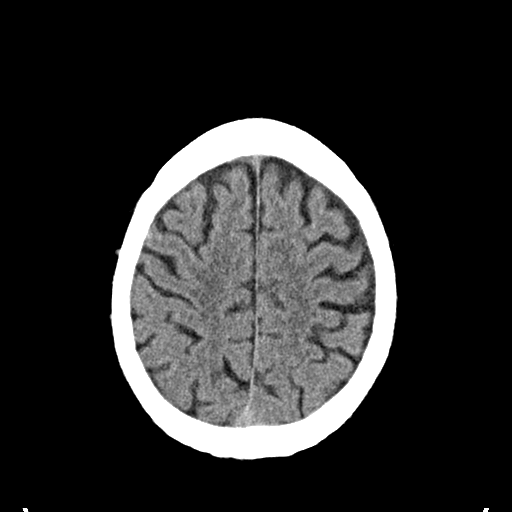
[im 22/30  brain]
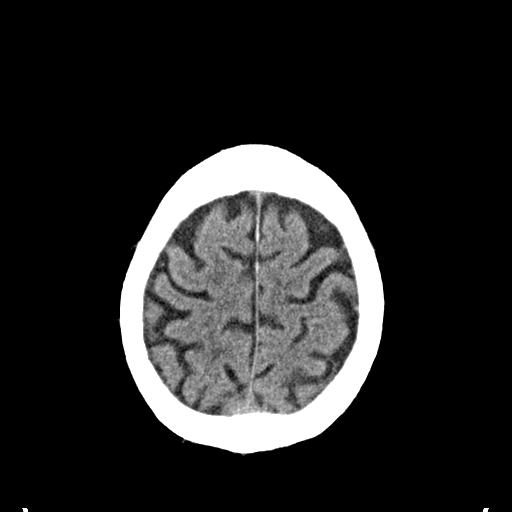
[im 23/30  brain]
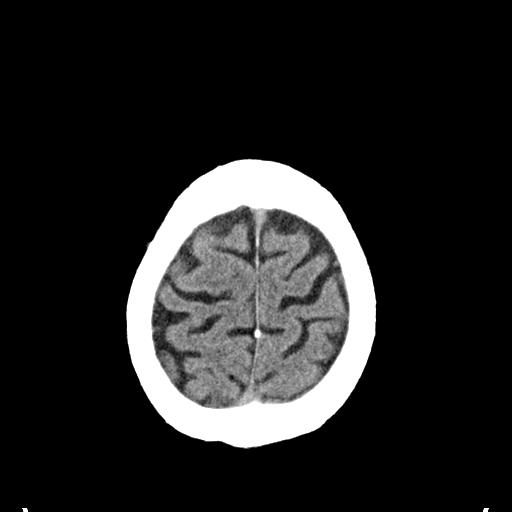
[im 23/30  bone]
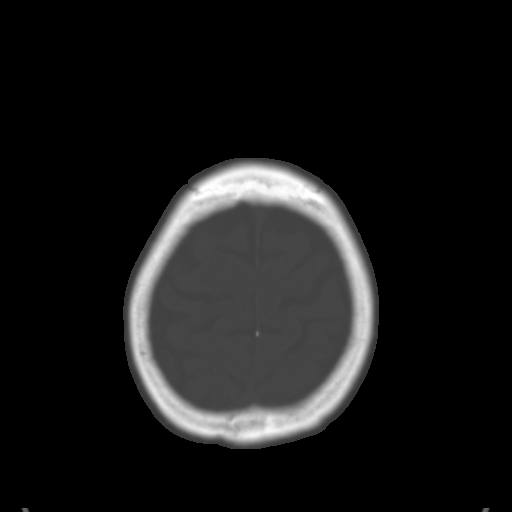
[im 25/30  brain]
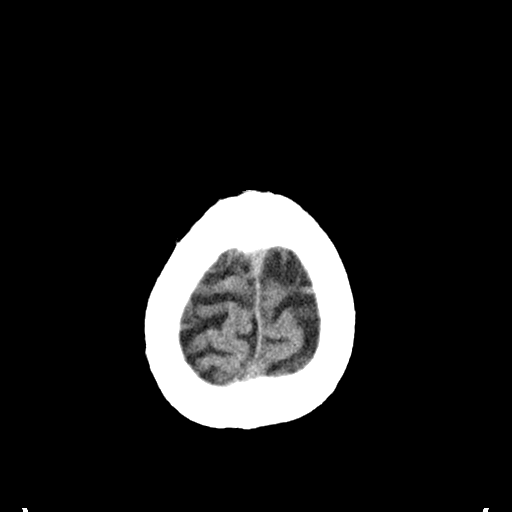
[im 27/30  brain]
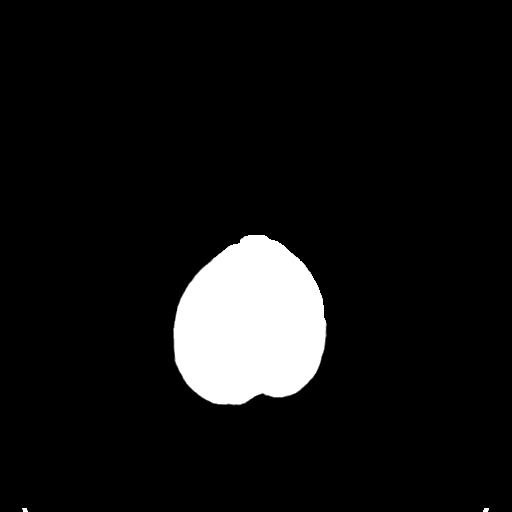
[im 29/30  brain]
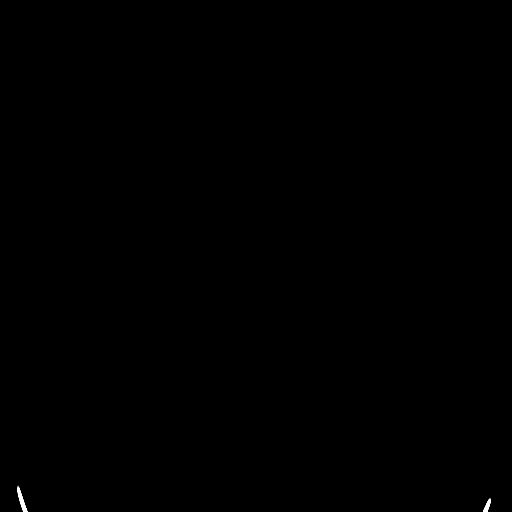

[16 of 30 positions shown; findings below may reference images not displayed]

FINDINGS: Bony calvarium is intact.

Very mild atrophic changes are noted. No findings to suggest acute
hemorrhage, acute infarction or space-occupying mass lesion are
noted.
IMPRESSION: No acute intracranial abnormality noted.

## 2018-03-21 NOTE — Addendum Note (Signed)
Addended by: Bartholome Bill on: 03/21/2018 10:24 AM   Modules accepted: Orders

## 2018-03-29 ENCOUNTER — Other Ambulatory Visit: Payer: Self-pay | Admitting: Cardiology

## 2018-03-29 ENCOUNTER — Other Ambulatory Visit: Payer: Self-pay | Admitting: Family Medicine

## 2018-04-05 ENCOUNTER — Other Ambulatory Visit: Payer: Self-pay | Admitting: Family Medicine

## 2018-04-17 ENCOUNTER — Other Ambulatory Visit: Payer: Self-pay | Admitting: Cardiology

## 2018-04-28 ENCOUNTER — Other Ambulatory Visit: Payer: Self-pay | Admitting: Family Medicine

## 2018-05-03 ENCOUNTER — Telehealth: Payer: Self-pay | Admitting: Cardiology

## 2018-05-03 ENCOUNTER — Ambulatory Visit (INDEPENDENT_AMBULATORY_CARE_PROVIDER_SITE_OTHER): Payer: Medicare HMO | Admitting: *Deleted

## 2018-05-03 DIAGNOSIS — I495 Sick sinus syndrome: Secondary | ICD-10-CM

## 2018-05-03 NOTE — Telephone Encounter (Signed)
LMOVM reminding pt to send remote transmission.   

## 2018-05-04 ENCOUNTER — Encounter: Payer: Self-pay | Admitting: Cardiology

## 2018-05-04 NOTE — Progress Notes (Signed)
Letter  

## 2018-05-04 NOTE — Progress Notes (Signed)
Remote pacemaker transmission.   

## 2018-05-10 ENCOUNTER — Other Ambulatory Visit: Payer: Self-pay | Admitting: Cardiology

## 2018-05-10 ENCOUNTER — Other Ambulatory Visit: Payer: Self-pay | Admitting: Family Medicine

## 2018-05-10 NOTE — Telephone Encounter (Signed)
REFILL 

## 2018-05-24 ENCOUNTER — Other Ambulatory Visit: Payer: Self-pay | Admitting: Family Medicine

## 2018-05-29 ENCOUNTER — Other Ambulatory Visit: Payer: Self-pay | Admitting: Family Medicine

## 2018-05-30 LAB — CUP PACEART REMOTE DEVICE CHECK
Battery Remaining Longevity: 85 mo
Brady Statistic AP VP Percent: 8 %
Brady Statistic AP VS Percent: 64.91 %
Brady Statistic AS VP Percent: 3.84 %
Implantable Lead Implant Date: 20170420
Implantable Lead Location: 753859
Implantable Lead Model: 5076
Implantable Lead Model: 5076
Lead Channel Impedance Value: 361 Ohm
Lead Channel Impedance Value: 361 Ohm
Lead Channel Impedance Value: 399 Ohm
Lead Channel Pacing Threshold Pulse Width: 0.4 ms
Lead Channel Sensing Intrinsic Amplitude: 1.5 mV
Lead Channel Sensing Intrinsic Amplitude: 11.625 mV
Lead Channel Sensing Intrinsic Amplitude: 11.625 mV
Lead Channel Setting Pacing Amplitude: 2 V
Lead Channel Setting Pacing Amplitude: 2.5 V
Lead Channel Setting Pacing Pulse Width: 0.4 ms
Lead Channel Setting Sensing Sensitivity: 2 mV
MDC IDC LEAD IMPLANT DT: 20170420
MDC IDC LEAD LOCATION: 753860
MDC IDC MSMT BATTERY VOLTAGE: 3 V
MDC IDC MSMT LEADCHNL RA IMPEDANCE VALUE: 437 Ohm
MDC IDC MSMT LEADCHNL RA PACING THRESHOLD AMPLITUDE: 0.625 V
MDC IDC MSMT LEADCHNL RA PACING THRESHOLD PULSEWIDTH: 0.4 ms
MDC IDC MSMT LEADCHNL RA SENSING INTR AMPL: 1.5 mV
MDC IDC MSMT LEADCHNL RV PACING THRESHOLD AMPLITUDE: 0.625 V
MDC IDC PG IMPLANT DT: 20170420
MDC IDC SESS DTM: 20190509172517
MDC IDC STAT BRADY AS VS PERCENT: 23.25 %
MDC IDC STAT BRADY RA PERCENT PACED: 58.04 %
MDC IDC STAT BRADY RV PERCENT PACED: 11.14 %

## 2018-05-31 ENCOUNTER — Other Ambulatory Visit: Payer: Self-pay | Admitting: Family Medicine

## 2018-05-31 ENCOUNTER — Telehealth: Payer: Self-pay | Admitting: *Deleted

## 2018-05-31 MED ORDER — METOPROLOL TARTRATE 100 MG PO TABS: 100.0000 mg | ORAL_TABLET | Freq: Two times a day (BID) | ORAL | 3 refills | Status: DC

## 2018-05-31 NOTE — Telephone Encounter (Signed)
Explained medication increase to Jason [name], he verbalizes understanding. He will take 2 tabs of the 50mg  metoprolol twice daily until his new 100mg  BID rx comes from [facility]. He is aware to monitor BP from home and call with episodes of dizziness.

## 2018-05-31 NOTE — Telephone Encounter (Signed)
Regarding 05/04/18 remote pacemaker transmission: Notes recorded by Constance Haw, MD on 05/31/2018 at 9:49 AM EDT Normal interrogation reviewed. Battery and lead parameters stable. AF with RVR seen. Increase metoprolol to 100 mg BID  LMOM to return call.

## 2018-06-02 ENCOUNTER — Inpatient Hospital Stay: Payer: Medicare HMO | Attending: Hematology & Oncology | Admitting: Hematology & Oncology

## 2018-06-02 ENCOUNTER — Encounter: Payer: Self-pay | Admitting: Hematology & Oncology

## 2018-06-02 ENCOUNTER — Inpatient Hospital Stay: Payer: Medicare HMO

## 2018-06-02 VITALS — BP 132/72 | HR 87 | Temp 98.6°F | Resp 18 | Wt 202.0 lb

## 2018-06-02 DIAGNOSIS — Z950 Presence of cardiac pacemaker: Secondary | ICD-10-CM | POA: Diagnosis not present

## 2018-06-02 DIAGNOSIS — C9210 Chronic myeloid leukemia, BCR/ABL-positive, not having achieved remission: Secondary | ICD-10-CM

## 2018-06-02 DIAGNOSIS — I480 Paroxysmal atrial fibrillation: Secondary | ICD-10-CM

## 2018-06-02 DIAGNOSIS — Z7901 Long term (current) use of anticoagulants: Secondary | ICD-10-CM | POA: Diagnosis not present

## 2018-06-02 DIAGNOSIS — Z7983 Long term (current) use of bisphosphonates: Secondary | ICD-10-CM

## 2018-06-02 DIAGNOSIS — Z79899 Other long term (current) drug therapy: Secondary | ICD-10-CM | POA: Diagnosis not present

## 2018-06-02 LAB — CMP (CANCER CENTER ONLY)
ALT: 13 U/L (ref 0–55)
AST: 21 U/L (ref 5–34)
Albumin: 3.9 g/dL (ref 3.5–5.0)
Alkaline Phosphatase: 99 U/L (ref 40–150)
Anion gap: 9 (ref 3–11)
BILIRUBIN TOTAL: 0.4 mg/dL (ref 0.2–1.2)
BUN: 8 mg/dL (ref 7–26)
CO2: 25 mmol/L (ref 22–29)
CREATININE: 0.94 mg/dL (ref 0.70–1.30)
Calcium: 8.7 mg/dL (ref 8.4–10.4)
Chloride: 108 mmol/L (ref 98–109)
GFR, Est AFR Am: >60 mL/min (ref >60)
Glucose, Bld: 87 mg/dL (ref 70–140)
POTASSIUM: 3.2 mmol/L — AB (ref 3.5–5.1)
Sodium: 142 mmol/L (ref 136–145)
TOTAL PROTEIN: 7.3 g/dL (ref 6.4–8.3)

## 2018-06-02 LAB — CBC WITH DIFFERENTIAL (CANCER CENTER ONLY)
BASOS ABS: 0.1 10*3/uL (ref 0.0–0.1)
Basophils Relative: 1 %
Eosinophils Absolute: 0.4 10*3/uL (ref 0.0–0.5)
Eosinophils Relative: 5 %
HEMATOCRIT: 39 % (ref 38.7–49.9)
Hemoglobin: 13 g/dL (ref 13.0–17.1)
LYMPHS ABS: 1.8 10*3/uL (ref 0.9–3.3)
LYMPHS PCT: 19 %
MCH: 31 pg (ref 28.0–33.4)
MCHC: 33.3 g/dL (ref 32.0–35.9)
MCV: 92.9 fL (ref 82.0–98.0)
MONO ABS: 0.8 10*3/uL (ref 0.1–0.9)
MONOS PCT: 9 %
NEUTROS ABS: 6.3 10*3/uL (ref 1.5–6.5)
Neutrophils Relative %: 66 %
Platelet Count: 238 10*3/uL (ref 145–400)
RBC: 4.2 MIL/uL (ref 4.20–5.70)
RDW: 14.4 % (ref 11.1–15.7)
WBC Count: 9.4 10*3/uL (ref 4.0–10.0)

## 2018-06-02 NOTE — Progress Notes (Signed)
Hematology and Oncology Follow Up Visit  Jason [name] [id number] 10/09/1946 72 y.o. 06/02/2018   Principle Diagnosis:  Chronic phase CML - MMR Paroxysmal atrial fibrillation - has pacemaker   Current Therapy:   [name] 200 mg by mouth daily Xarelto 20 mg by mouth daily   Interim History:  Jason [name] is here today for follow-up.  We last saw him back in the winter.  He had no problems over the winter/spring.  Thankfully, his pacemaker is doing well for him.  He has had no cardiac issues.  His last BCR/ABL ratio was 0.04%.  As such, he is still in a major molecular remission (MMR).  He had a nice Easter.    He does not eat all that much for fear of gallbladder issues.  He has had no problems with nausea or vomiting.  He has had no change in bowel or bladder habits.  He has had no edema.  He has had no rashes.  He has had no headache.  Overall, his performance status is ECOG 1.   Medications:  Allergies as of 06/02/2018   No Known Allergies     Medication List        Accurate as of 06/02/18 10:56 AM. Always use your most recent med list.          acetaminophen 325 MG tablet Commonly known as:  TYLENOL Take 650 mg by mouth every 6 (six) hours as needed. For headache.   amiodarone 200 MG tablet Commonly known as:  PACERONE Take 1 tablet (200 mg total) by mouth daily.   amiodarone 200 MG tablet Commonly known as:  PACERONE TAKE 1 TABLET BY MOUTH TWICE DAILY   amLODipine 5 MG tablet Commonly known as:  NORVASC TAKE 1 TABLET EVERY DAY (NEED MD APPOINTMENT)   atorvastatin 40 MG tablet Commonly known as:  LIPITOR TAKE 1 TABLET EVERY DAY   dicyclomine 20 MG tablet Commonly known as:  BENTYL Take 1 tablet (20 mg total) by mouth 2 (two) times daily.   ferrous sulfate 325 (65 FE) MG EC tablet Take 1 tablet (325 mg total) by mouth 2 (two) times daily.   fluticasone 50 MCG/ACT nasal spray Commonly known as:  FLONASE USE 2 SPRAYS IN EACH NOSTRIL EVERY DAY   imatinib  100 MG tablet Commonly known as:  [name] TAKE 2 TABLETS BY MOUTH ONCE DAILY WITH MEALS AND  A  LARGE  GLASS  OF  WATER. Please dispense generic as insurance will not pay for brand.   losartan 100 MG tablet Commonly known as:  COZAAR TAKE 1 TABLET EVERY DAY   meclizine 25 MG tablet Commonly known as:  ANTIVERT TAKE 1 TABLET THREE TIMES DAILY   metoprolol tartrate 50 MG tablet Commonly known as:  LOPRESSOR Take 50 mg by mouth 2 (two) times daily. Take 4 tablets 200 mg twice a day.   nitroGLYCERIN 0.4 MG SL tablet Commonly known as:  NITROSTAT Place 1 tablet (0.4 mg total) under the tongue every 5 (five) minutes as needed for chest pain. Please schedule appointment for refills.   ofloxacin 0.3 % OTIC solution Commonly known as:  FLOXIN OTIC Place 10 drops into both ears daily. For 7 days   omeprazole 40 MG capsule Commonly known as:  PRILOSEC TAKE 1 CAPSULE EVERY DAY   ondansetron 4 MG disintegrating tablet Commonly known as:  ZOFRAN ODT Take 1 tablet (4 mg total) by mouth every 8 (eight) hours as needed for nausea or vomiting.  ondansetron 4 MG tablet Commonly known as:  ZOFRAN TAKE 1 TABLET BY MOUTH EVERY 8 HOURS AS NEEDED FOR NAUSEA AND VOMTING   PROBIOTIC FORMULA PO Take 1 capsule by mouth daily.   ranitidine 300 MG tablet Commonly known as:  ZANTAC TAKE 1 TABLET AT BEDTIME   rivaroxaban 20 MG Tabs tablet Commonly known as:  XARELTO Take 1 tablet (20 mg total) by mouth daily with supper.       Allergies: No Known Allergies  Past Medical History, Surgical history, Social history, and Family History were reviewed and updated.  Review of Systems: Review of Systems  Constitutional: Negative.   HENT: Negative.   Eyes: Negative.   Respiratory: Negative.   Cardiovascular: Negative.   Gastrointestinal: Negative.   Genitourinary: Negative.   Musculoskeletal: Negative.   Skin: Negative.   Neurological: Negative.   Endo/Heme/Allergies: Negative.     Psychiatric/Behavioral: Negative.      Physical Exam:  weight is 202 lb (91.6 kg). His oral temperature is 98.6 F (37 C). His blood pressure is 132/72 and his pulse is 87. His respiration is 18 and oxygen saturation is 96%.   Wt Readings from Last 3 Encounters:  06/02/18 202 lb (91.6 kg)  03/16/18 203 lb 9.6 oz (92.4 kg)  02/01/18 194 lb (88 kg)    Physical Exam  Constitutional: He is oriented to person, place, and time.  HENT:  Head: Normocephalic and atraumatic.  Mouth/Throat: Oropharynx is clear and moist.  Eyes: Pupils are equal, round, and reactive to light. EOM are normal.  Neck: Normal range of motion.  Cardiovascular: Normal rate, regular rhythm and normal heart sounds.  Pulmonary/Chest: Effort normal and breath sounds normal.  Abdominal: Soft. Bowel sounds are normal.  Musculoskeletal: Normal range of motion. He exhibits no edema, tenderness or deformity.  Lymphadenopathy:    He has no cervical adenopathy.  Neurological: He is alert and oriented to person, place, and time.  Skin: Skin is warm and dry. No rash noted. No erythema.  Psychiatric: He has a normal mood and affect. His behavior is normal. Judgment and thought content normal.  Vitals reviewed.    Lab Results  Component Value Date   WBC 9.4 06/02/2018   HGB 13.0 06/02/2018   HCT 39.0 06/02/2018   MCV 92.9 06/02/2018   PLT 238 06/02/2018   Lab Results  Component Value Date   IRON 84 05/27/2015   TIBC 359 05/27/2015   UIBC 275 05/27/2015   IRONPCTSAT 23 05/27/2015   Lab Results  Component Value Date   RETICCTPCT 1.2 05/27/2015   RBC 4.20 06/02/2018   RETICCTABS 48.2 05/27/2015   No results found for: KPAFRELGTCHN, LAMBDASER, KAPLAMBRATIO No results found for: IGGSERUM, IGA, IGMSERUM No results found for: Odetta Pink, SPEI   Chemistry      Component Value Date/Time   NA 144 03/16/2018 1156   NA 143 11/26/2016 1030   K 3.7 03/16/2018  1156   K 3.8 11/26/2016 1030   CL 109 03/16/2018 1156   CL 109 (H) 03/31/2015 1119   CO2 25 03/16/2018 1156   CO2 25 11/26/2016 1030   BUN 8 03/16/2018 1156   BUN 5.1 (L) 11/26/2016 1030   CREATININE 1.04 03/16/2018 1156   CREATININE 1.0 11/26/2016 1030      Component Value Date/Time   CALCIUM 9.1 03/16/2018 1156   CALCIUM 9.3 11/26/2016 1030   ALKPHOS 88 03/16/2018 1156   ALKPHOS 106 11/26/2016 1030   AST 14  03/16/2018 1156   AST 16 11/26/2016 1030   ALT 9 03/16/2018 1156   ALT 10 11/26/2016 1030   BILITOT 0.2 03/16/2018 1156   BILITOT 0.53 11/26/2016 1030      Impression and Plan: Jason [name] is a very pleasant 72 yo caucasian male with chronic phase CML.  He is in a major molecular remission.  He is doing well with [name].  He is not having any toxicity.  We will get him back in 6 more months.  This would be be reasonable.  Volanda Napoleon, MD 6/7/201910:56 AM

## 2018-06-16 LAB — BCR/ABL

## 2018-06-19 ENCOUNTER — Other Ambulatory Visit: Payer: Self-pay | Admitting: Family Medicine

## 2018-07-03 ENCOUNTER — Other Ambulatory Visit: Payer: Self-pay | Admitting: Cardiology

## 2018-07-14 ENCOUNTER — Other Ambulatory Visit: Payer: Self-pay | Admitting: Family Medicine

## 2018-07-20 ENCOUNTER — Other Ambulatory Visit: Payer: Self-pay | Admitting: *Deleted

## 2018-07-20 DIAGNOSIS — I714 Abdominal aortic aneurysm, without rupture, unspecified: Secondary | ICD-10-CM

## 2018-07-20 NOTE — Progress Notes (Signed)
vas 

## 2018-08-02 ENCOUNTER — Other Ambulatory Visit: Payer: Self-pay | Admitting: Family Medicine

## 2018-08-02 ENCOUNTER — Encounter: Payer: Medicare HMO | Admitting: *Deleted

## 2018-08-08 ENCOUNTER — Encounter: Payer: Self-pay | Admitting: Cardiology

## 2018-08-09 ENCOUNTER — Other Ambulatory Visit: Payer: Self-pay | Admitting: Family Medicine

## 2018-08-14 ENCOUNTER — Ambulatory Visit (INDEPENDENT_AMBULATORY_CARE_PROVIDER_SITE_OTHER): Payer: Medicare HMO | Admitting: *Deleted

## 2018-08-14 DIAGNOSIS — I495 Sick sinus syndrome: Secondary | ICD-10-CM

## 2018-08-15 NOTE — Progress Notes (Signed)
Remote pacemaker transmission.   

## 2018-08-21 ENCOUNTER — Other Ambulatory Visit: Payer: Self-pay | Admitting: Family Medicine

## 2018-09-05 ENCOUNTER — Other Ambulatory Visit: Payer: Self-pay | Admitting: Family Medicine

## 2018-09-05 ENCOUNTER — Other Ambulatory Visit: Payer: Self-pay | Admitting: Cardiology

## 2018-09-05 NOTE — Telephone Encounter (Signed)
Pt is a 72 yr old male who last saw Dr Curt Bears on 02/01/18. Last noted SCr was 0.94 on 06/02/18 and weight at time was 91.6Kg. CrCl is 71mL/min. Will refill Xarelto 20mg  QD.

## 2018-09-18 LAB — CUP PACEART REMOTE DEVICE CHECK
Battery Voltage: 3 V
Brady Statistic AP VP Percent: 1.88 %
Brady Statistic AS VP Percent: 5.31 %
Brady Statistic RA Percent Paced: 64.28 %
Implantable Lead Location: 753859
Implantable Lead Location: 753860
Implantable Lead Model: 5076
Implantable Lead Model: 5076
Lead Channel Impedance Value: 380 Ohm
Lead Channel Impedance Value: 380 Ohm
Lead Channel Impedance Value: 456 Ohm
Lead Channel Pacing Threshold Amplitude: 0.625 V
Lead Channel Pacing Threshold Pulse Width: 0.4 ms
Lead Channel Pacing Threshold Pulse Width: 0.4 ms
Lead Channel Sensing Intrinsic Amplitude: 11.125 mV
Lead Channel Sensing Intrinsic Amplitude: 2.375 mV
Lead Channel Setting Pacing Amplitude: 2 V
Lead Channel Setting Pacing Amplitude: 2.5 V
Lead Channel Setting Pacing Pulse Width: 0.4 ms
MDC IDC LEAD IMPLANT DT: 20170420
MDC IDC LEAD IMPLANT DT: 20170420
MDC IDC MSMT BATTERY REMAINING LONGEVITY: 86 mo
MDC IDC MSMT LEADCHNL RA SENSING INTR AMPL: 2.375 mV
MDC IDC MSMT LEADCHNL RV IMPEDANCE VALUE: 418 Ohm
MDC IDC MSMT LEADCHNL RV PACING THRESHOLD AMPLITUDE: 0.625 V
MDC IDC MSMT LEADCHNL RV SENSING INTR AMPL: 11.125 mV
MDC IDC PG IMPLANT DT: 20170420
MDC IDC SESS DTM: 20190819132630
MDC IDC SET LEADCHNL RV SENSING SENSITIVITY: 2 mV
MDC IDC STAT BRADY AP VS PERCENT: 87.97 %
MDC IDC STAT BRADY AS VS PERCENT: 4.85 %
MDC IDC STAT BRADY RV PERCENT PACED: 7.17 %

## 2018-09-22 ENCOUNTER — Encounter: Payer: Self-pay | Admitting: Family Medicine

## 2018-09-22 ENCOUNTER — Ambulatory Visit (INDEPENDENT_AMBULATORY_CARE_PROVIDER_SITE_OTHER): Payer: Medicare HMO | Admitting: Family Medicine

## 2018-09-22 VITALS — BP 110/70 | HR 71 | Temp 98.4°F | Resp 18 | Ht 67.0 in | Wt 207.2 lb

## 2018-09-22 DIAGNOSIS — C9210 Chronic myeloid leukemia, BCR/ABL-positive, not having achieved remission: Secondary | ICD-10-CM | POA: Diagnosis not present

## 2018-09-22 DIAGNOSIS — Z0000 Encounter for general adult medical examination without abnormal findings: Secondary | ICD-10-CM

## 2018-09-22 DIAGNOSIS — I2510 Atherosclerotic heart disease of native coronary artery without angina pectoris: Secondary | ICD-10-CM

## 2018-09-22 DIAGNOSIS — R079 Chest pain, unspecified: Secondary | ICD-10-CM

## 2018-09-22 DIAGNOSIS — E782 Mixed hyperlipidemia: Secondary | ICD-10-CM

## 2018-09-22 DIAGNOSIS — R42 Dizziness and giddiness: Secondary | ICD-10-CM

## 2018-09-22 DIAGNOSIS — R350 Frequency of micturition: Secondary | ICD-10-CM | POA: Diagnosis not present

## 2018-09-22 DIAGNOSIS — I10 Essential (primary) hypertension: Secondary | ICD-10-CM

## 2018-09-22 DIAGNOSIS — K219 Gastro-esophageal reflux disease without esophagitis: Secondary | ICD-10-CM | POA: Diagnosis not present

## 2018-09-22 DIAGNOSIS — I480 Paroxysmal atrial fibrillation: Secondary | ICD-10-CM | POA: Diagnosis not present

## 2018-09-22 DIAGNOSIS — I2583 Coronary atherosclerosis due to lipid rich plaque: Secondary | ICD-10-CM

## 2018-09-22 LAB — CBC
HCT: 39.8 % (ref 39.0–52.0)
HEMOGLOBIN: 13.4 g/dL (ref 13.0–17.0)
MCHC: 33.6 g/dL (ref 30.0–36.0)
MCV: 93.1 fl (ref 78.0–100.0)
Platelets: 227 10*3/uL (ref 150.0–400.0)
RBC: 4.27 Mil/uL (ref 4.22–5.81)
RDW: 15.1 % (ref 11.5–15.5)
WBC: 7.8 10*3/uL (ref 4.0–10.5)

## 2018-09-22 LAB — COMPREHENSIVE METABOLIC PANEL
ALT: 15 U/L (ref 0–53)
AST: 16 U/L (ref 0–37)
Albumin: 3.9 g/dL (ref 3.5–5.2)
Alkaline Phosphatase: 86 U/L (ref 39–117)
BUN: 8 mg/dL (ref 6–23)
CHLORIDE: 107 meq/L (ref 96–112)
CO2: 29 mEq/L (ref 19–32)
Calcium: 8.8 mg/dL (ref 8.4–10.5)
Creatinine, Ser: 1.04 mg/dL (ref 0.40–1.50)
GFR: 74.93 mL/min (ref >60.00)
Glucose, Bld: 87 mg/dL (ref 70–99)
Potassium: 3.3 mEq/L — ABNORMAL LOW (ref 3.5–5.1)
Sodium: 144 mEq/L (ref 135–145)
TOTAL PROTEIN: 6.7 g/dL (ref 6.0–8.3)
Total Bilirubin: 0.6 mg/dL (ref 0.2–1.2)

## 2018-09-22 LAB — TSH: TSH: 4.6 u[IU]/mL — AB (ref 0.35–4.50)

## 2018-09-22 LAB — LIPID PANEL
CHOLESTEROL: 105 mg/dL (ref 0–200)
HDL: 31.8 mg/dL — ABNORMAL LOW (ref >39.00)
LDL CALC: 55 mg/dL (ref 0–99)
NonHDL: 72.7
Total CHOL/HDL Ratio: 3
Triglycerides: 88 mg/dL (ref 0.0–149.0)
VLDL: 17.6 mg/dL (ref 0.0–40.0)

## 2018-09-22 LAB — PSA: PSA: 0.98 ng/mL (ref 0.10–4.00)

## 2018-09-22 MED ORDER — NITROGLYCERIN 0.4 MG SL SUBL: 0.4000 mg | SUBLINGUAL_TABLET | SUBLINGUAL | 1 refills | Status: DC | PRN

## 2018-09-22 NOTE — Assessment & Plan Note (Signed)
Pacer in place doing well follows with cardiology

## 2018-09-22 NOTE — Progress Notes (Signed)
Subjective:    Patient ID: Jason [name], male    DOB: Dec 22, 1946, 72 y.o.   MRN: [id number]  No chief complaint on file.   HPI Patient is in today for annual preventative exam and follow up on chronic medical concerns including CML, Afib, hypertension and hyperlipidemia. He feels well today. No recent febrile illness or hospitalizations. He denies any recent chest pain but he notes his Nitroglycerin he is supposed to carry has expired. He has only needed it once. He is trying to stay active and maintain a heart healthy diet. Denies CP/palp/SOB/HA/congestion/fevers/GI or GU c/o. Taking meds as prescribed  Past Medical History:  Diagnosis Date  . Anemia 05/08/2015  . CML (chronic myelocytic leukemia) ([place]) 12/16/2011  . Coronary heart disease   . CVA (cerebrovascular accident) ([place])   . GERD (gastroesophageal reflux disease)   . Grief reaction 03/18/2014  . H/O tobacco use, presenting hazards to health 02/24/2012   No cigarettes since pacemaker placement. Encouraged ongoing efforts   . History of chicken pox   . History of kidney stones   . Hyperlipidemia   . Hypertension   . IBS (irritable bowel syndrome) 09/20/2013  . Medicare annual wellness visit, subsequent 03/23/2014  . PAF (paroxysmal atrial fibrillation) ([place])   . Symptomatic bradycardia    a. symptomatic bradycardia due to sinus node dysfunction s/p RA and RV Medtronic pacemaker 04/15/16    Past Surgical History:  Procedure Laterality Date  . CATARACT EXTRACTION    . CHOLECYSTECTOMY  2003  . CORONARY ANGIOPLASTY WITH STENT PLACEMENT  2000  . EP IMPLANTABLE DEVICE N/A 04/15/2016   Procedure: Pacemaker Implant;  Surgeon: Evans Lance, MD; Medtronic (serial number [id number]) pacemaker; Laterality: Left    Family History  Problem Relation Age of Onset  . Arthritis Mother   . Hypertension Mother   . Heart disease Mother        Rheumatic fever  . Rheumatic fever Mother   . Other Mother        brain tumor  . Cancer  Mother        leukemia  . Hypertension Father   . Alcohol abuse Father   . Diabetes Son   . Colon cancer Neg Hx   . Breast cancer Neg Hx   . Prostate cancer Neg Hx     Social History   Socioeconomic History  . Marital status: Widowed    Spouse name: Not on file  . Number of children: 3  . Years of education: Not on file  . Highest education level: Not on file  Occupational History  . Occupation: Retired    Comment: Retired  Scientific laboratory technician  . Financial resource strain: Not on file  . Food insecurity:    Worry: Not on file    Inability: Not on file  . Transportation needs:    Medical: Not on file    Non-medical: Not on file  Tobacco Use  . Smoking status: Former Smoker    Packs/day: 0.25    Years: 40.00    Pack years: 10.00    Types: Cigarettes    Start date: 10/30/1967    Last attempt to quit: 04/14/2016    Years since quitting: 2.4  . Smokeless tobacco: Never Used  . Tobacco comment: 04-16 quit smoking  Substance and Sexual Activity  . Alcohol use: No    Alcohol/week: 0.0 standard drinks    Comment: Rare  . Drug use: No  . Sexual activity: Never  Comment: wife died in 1/15 married 54 years, grandson livew with patient  Lifestyle  . Physical activity:    Days per week: Not on file    Minutes per session: Not on file  . Stress: Not on file  Relationships  . Social connections:    Talks on phone: Not on file    Gets together: Not on file    Attends religious service: Not on file    Active member of club or organization: Not on file    Attends meetings of clubs or organizations: Not on file    Relationship status: Not on file  . Intimate partner violence:    Fear of current or ex partner: Not on file    Emotionally abused: Not on file    Physically abused: Not on file    Forced sexual activity: Not on file  Other Topics Concern  . Not on file  Social History Narrative  . Not on file    Outpatient Medications Prior to Visit  Medication Sig Dispense  Refill  . acetaminophen (TYLENOL) 325 MG tablet Take 650 mg by mouth every 6 (six) hours as needed. For headache.    Marland Kitchen amiodarone (PACERONE) 200 MG tablet Take 1 tablet (200 mg total) by mouth daily. 90 tablet 3  . amLODipine (NORVASC) 5 MG tablet Take 1 tablet (5 mg total) by mouth daily. Take one tablet every day (Keep OV) 90 tablet 3  . atorvastatin (LIPITOR) 40 MG tablet TAKE 1 TABLET EVERY DAY   90 tablet 3  . dicyclomine (BENTYL) 20 MG tablet Take 1 tablet (20 mg total) by mouth 2 (two) times daily. 20 tablet 0  . ferrous sulfate 325 (65 FE) MG EC tablet Take 1 tablet (325 mg total) by mouth 2 (two) times daily. 60 tablet 11  . fluticasone (FLONASE) 50 MCG/ACT nasal spray USE 2 SPRAYS IN EACH NOSTRIL EVERY DAY 48 g 3  . imatinib ([name]) 100 MG tablet TAKE 2 TABLETS BY MOUTH ONCE DAILY WITH MEALS AND  A  LARGE  GLASS  OF  WATER. Please dispense generic as insurance will not pay for brand. 180 tablet 3  . losartan (COZAAR) 100 MG tablet TAKE 1 TABLET EVERY DAY 90 tablet 0  . meclizine (ANTIVERT) 25 MG tablet TAKE 1 TABLET THREE TIMES DAILY 90 tablet 0  . metoprolol tartrate (LOPRESSOR) 50 MG tablet Take 50 mg by mouth 2 (two) times daily. Take 4 tablets 200 mg twice a day.    . nitroGLYCERIN (NITROSTAT) 0.4 MG SL tablet Place 1 tablet (0.4 mg total) under the tongue every 5 (five) minutes as needed for chest pain. Please schedule appointment for refills. 25 tablet 0  . ofloxacin (FLOXIN OTIC) 0.3 % OTIC solution Place 10 drops into both ears daily. For 7 days 10 mL 0  . omeprazole (PRILOSEC) 40 MG capsule TAKE 1 CAPSULE EVERY DAY 90 capsule 3  . ondansetron (ZOFRAN) 4 MG tablet TAKE 1 TABLET BY MOUTH EVERY 8 HOURS AS NEEDED FOR  NAUSEA  AND  VOMITING 40 tablet 1  . Probiotic Product (PROBIOTIC FORMULA PO) Take 1 capsule by mouth daily.     . ranitidine (ZANTAC) 300 MG tablet TAKE 1 TABLET AT BEDTIME 90 tablet 3  . XARELTO 20 MG TABS tablet TAKE 1 TABLET EVERY DAY WITH SUPPER 90 tablet 1  .  amiodarone (PACERONE) 200 MG tablet TAKE 1 TABLET BY MOUTH TWICE DAILY 60 tablet 5  . ondansetron (ZOFRAN ODT) 4 MG disintegrating  tablet Take 1 tablet (4 mg total) by mouth every 8 (eight) hours as needed for nausea or vomiting. 20 tablet 0   No facility-administered medications prior to visit.     No Known Allergies  Review of Systems  Constitutional: Negative for fever and malaise/fatigue.  HENT: Negative for congestion.   Eyes: Negative for blurred vision.  Respiratory: Negative for shortness of breath.   Cardiovascular: Negative for chest pain, palpitations and leg swelling.  Gastrointestinal: Negative for abdominal pain, blood in stool and nausea.  Genitourinary: Positive for frequency. Negative for dysuria, flank pain and hematuria.  Musculoskeletal: Negative for falls.  Skin: Negative for rash.  Neurological: Negative for dizziness, loss of consciousness and headaches.  Endo/Heme/Allergies: Negative for environmental allergies.  Psychiatric/Behavioral: Negative for depression. The patient is not nervous/anxious.        Objective:    Physical Exam  Constitutional: He is oriented to person, place, and time. He appears well-developed and well-nourished. No distress.  HENT:  Head: Normocephalic and atraumatic.  Left Ear: External ear normal.  Nose: Nose normal.  Mouth/Throat: Oropharynx is clear and moist.  Eyes: Pupils are equal, round, and reactive to light. EOM are normal. Right eye exhibits no discharge. Left eye exhibits no discharge.  Neck: Normal range of motion. Neck supple.  Cardiovascular: Normal rate.  No murmur heard. Pulmonary/Chest: Effort normal and breath sounds normal. He has no wheezes.  Abdominal: Soft. Bowel sounds are normal. He exhibits no distension and no mass. There is no tenderness. There is no guarding.  Musculoskeletal: He exhibits no edema.  Neurological: He is alert and oriented to person, place, and time.  Skin: Skin is warm and dry.    Psychiatric: He has a normal mood and affect.  Nursing note and vitals reviewed.   BP 110/70 (BP Location: Left Arm, Patient Position: Sitting, Cuff Size: Normal)   Pulse 71   Temp 98.4 F (36.9 C) (Oral)   Resp 18   Ht _0  (1.702 m)   Wt 207 lb 3.2 oz (94 kg)   SpO2 97%   BMI 32.45 kg/m  Wt Readings from Last 3 Encounters:  09/22/18 207 lb 3.2 oz (94 kg)  06/02/18 202 lb (91.6 kg)  03/16/18 203 lb 9.6 oz (92.4 kg)     Lab Results  Component Value Date   WBC 7.8 09/22/2018   HGB 13.4 09/22/2018   HCT 39.8 09/22/2018   PLT 227.0 09/22/2018   GLUCOSE 87 09/22/2018   CHOL 105 09/22/2018   TRIG 88.0 09/22/2018   HDL 31.80 (L) 09/22/2018   LDLCALC 55 09/22/2018   ALT 15 09/22/2018   AST 16 09/22/2018   NA 144 09/22/2018   K 3.3 (L) 09/22/2018   CL 107 09/22/2018   CREATININE 1.04 09/22/2018   BUN 8 09/22/2018   CO2 29 09/22/2018   TSH 4.60 (H) 09/22/2018   PSA 0.98 09/22/2018   INR 1.07 06/27/2014    Lab Results  Component Value Date   TSH 4.60 (H) 09/22/2018   Lab Results  Component Value Date   WBC 7.8 09/22/2018   HGB 13.4 09/22/2018   HCT 39.8 09/22/2018   MCV 93.1 09/22/2018   PLT 227.0 09/22/2018   Lab Results  Component Value Date   NA 144 09/22/2018   K 3.3 (L) 09/22/2018   CHLORIDE 108 11/26/2016   CO2 29 09/22/2018   GLUCOSE 87 09/22/2018   BUN 8 09/22/2018   CREATININE 1.04 09/22/2018   BILITOT 0.6 09/22/2018  ALKPHOS 86 09/22/2018   AST 16 09/22/2018   ALT 15 09/22/2018   PROT 6.7 09/22/2018   ALBUMIN 3.9 09/22/2018   CALCIUM 8.8 09/22/2018   ANIONGAP 9 06/02/2018   EGFR 74 (L) 11/26/2016   GFR 74.93 09/22/2018   Lab Results  Component Value Date   CHOL 105 09/22/2018   Lab Results  Component Value Date   HDL 31.80 (L) 09/22/2018   Lab Results  Component Value Date   LDLCALC 55 09/22/2018   Lab Results  Component Value Date   TRIG 88.0 09/22/2018   Lab Results  Component Value Date   CHOLHDL 3 09/22/2018   No  results found for: HGBA1C     Assessment & Plan:   Problem List Items Addressed This Visit    Preventative health care    Patient encouraged to maintain heart healthy diet, regular exercise, adequate sleep. Consider daily probiotics. Take medications as prescribed. Given and reviewed copy of ACP documents from [place] of State and encouraged to complete and return      CML (chronic myelocytic leukemia) ([place]) (Chronic)    He is following closely with Hem/onc no new concerns.       CAD- prior stents 2000 by Dr Elonda Husky in [place] (Chronic)    Has not had any chest pain but is given refill on NTG to use prn if pain occurs.       Relevant Medications   nitroGLYCERIN (NITROSTAT) 0.4 MG SL tablet   Hyperlipidemia, mixed    Tolerating statin, encouraged heart healthy diet, avoid trans fats, minimize simple carbs and saturated fats. Increase exercise as tolerated      Relevant Medications   nitroGLYCERIN (NITROSTAT) 0.4 MG SL tablet   Other Relevant Orders   Lipid panel (Completed)   Atrial fibrillation (HCC)    Tolerating Xarelto and rate controlled      Relevant Medications   nitroGLYCERIN (NITROSTAT) 0.4 MG SL tablet   Chest pain with moderate risk of acute coronary syndrome    No recent episodes but his NTG is old we will refill it.       Dizziness    Better lately, taking less Meclizine at this time.       PAF (paroxysmal atrial fibrillation) (HCC)    Pacer in place doing well follows with cardiology      Relevant Medications   nitroGLYCERIN (NITROSTAT) 0.4 MG SL tablet   Hypertension    Well controlled, no changes to meds. Encouraged heart healthy diet such as the DASH diet and exercise as tolerated.       Relevant Medications   nitroGLYCERIN (NITROSTAT) 0.4 MG SL tablet   Other Relevant Orders   CBC (Completed)   Comprehensive metabolic panel (Completed)   TSH (Completed)   GERD (gastroesophageal reflux disease)    Avoid offending foods, start probiotics. Do not  eat large meals in late evening and consider raising head of bed.        Other Visit Diagnoses    Urinary frequency    -  Primary   Relevant Orders   PSA (Completed)      I am having Elyn Aquas. Witting start on nitroGLYCERIN. I am also having him maintain his Probiotic Product (PROBIOTIC FORMULA PO), acetaminophen, ferrous sulfate, nitroGLYCERIN, dicyclomine, ofloxacin, ranitidine, amiodarone, imatinib, atorvastatin, omeprazole, fluticasone, metoprolol tartrate, losartan, ondansetron, XARELTO, amLODipine, and meclizine.  Meds ordered this encounter  Medications  . nitroGLYCERIN (NITROSTAT) 0.4 MG SL tablet    Sig: Place 1 tablet (  0.4 mg total) under the tongue every 5 (five) minutes as needed for chest pain.    Dispense:  25 tablet    Refill:  1     Penni Homans, MD

## 2018-09-22 NOTE — Assessment & Plan Note (Signed)
Well controlled, no changes to meds. Encouraged heart healthy diet such as the DASH diet and exercise as tolerated.  °

## 2018-09-22 NOTE — Assessment & Plan Note (Signed)
No recent episodes but his NTG is old we will refill it.

## 2018-09-22 NOTE — Assessment & Plan Note (Signed)
Better lately, taking less Meclizine at this time.

## 2018-09-22 NOTE — Patient Instructions (Signed)
Preventive Care 70 Years and Older, Male Preventive care refers to lifestyle choices and visits with your health care provider that can promote health and wellness. What does preventive care include?  A yearly physical exam. This is also called an annual well check.  Dental exams once or twice a year.  Routine eye exams. Ask your health care provider how often you should have your eyes checked.  Personal lifestyle choices, including: ? Daily care of your teeth and gums. ? Regular physical activity. ? Eating a healthy diet. ? Avoiding tobacco and drug use. ? Limiting alcohol use. ? Practicing safe sex. ? Taking low doses of aspirin every day. ? Taking vitamin and mineral supplements as recommended by your health care provider. What happens during an annual well check? The services and screenings done by your health care provider during your annual well check will depend on your age, overall health, lifestyle risk factors, and family history of disease. Counseling Your health care provider may ask you questions about your:  Alcohol use.  Tobacco use.  Drug use.  Emotional well-being.  Home and relationship well-being.  Sexual activity.  Eating habits.  History of falls.  Memory and ability to understand (cognition).  Work and work environment.  Screening You may have the following tests or measurements:  Height, weight, and BMI.  Blood pressure.  Lipid and cholesterol levels. These may be checked every 5 years, or more frequently if you are over 50 years old.  Skin check.  Lung cancer screening. You may have this screening every year starting at age 55 if you have a 30-pack-year history of smoking and currently smoke or have quit within the past 15 years.  Fecal occult blood test (FOBT) of the stool. You may have this test every year starting at age 50.  Flexible sigmoidoscopy or colonoscopy. You may have a sigmoidoscopy every 5 years or a colonoscopy every 10  years starting at age 50.  Prostate cancer screening. Recommendations will vary depending on your family history and other risks.  Hepatitis C blood test.  Hepatitis B blood test.  Sexually transmitted disease (STD) testing.  Diabetes screening. This is done by checking your blood sugar (glucose) after you have not eaten for a while (fasting). You may have this done every 1-3 years.  Abdominal aortic aneurysm (AAA) screening. You may need this if you are a current or former smoker.  Osteoporosis. You may be screened starting at age 70 if you are at high risk.  Talk with your health care provider about your test results, treatment options, and if necessary, the need for more tests. Vaccines Your health care provider may recommend certain vaccines, such as:  Influenza vaccine. This is recommended every year.  Tetanus, diphtheria, and acellular pertussis (Tdap, Td) vaccine. You may need a Td booster every 10 years.  Varicella vaccine. You may need this if you have not been vaccinated.  Zoster vaccine. You may need this after age 60.  Measles, mumps, and rubella (MMR) vaccine. You may need at least one dose of MMR if you were born in 1957 or later. You may also need a second dose.  Pneumococcal 13-valent conjugate (PCV13) vaccine. One dose is recommended after age 70.  Pneumococcal polysaccharide (PPSV23) vaccine. One dose is recommended after age 70.  Meningococcal vaccine. You may need this if you have certain conditions.  Hepatitis A vaccine. You may need this if you have certain conditions or if you travel or work in places where you   may be exposed to hepatitis A.  Hepatitis B vaccine. You may need this if you have certain conditions or if you travel or work in places where you may be exposed to hepatitis B.  Haemophilus influenzae type b (Hib) vaccine. You may need this if you have certain risk factors.  Talk to your health care provider about which screenings and vaccines  you need and how often you need them. This information is not intended to replace advice given to you by your health care provider. Make sure you discuss any questions you have with your health care provider. Document Released: 01/09/2016 Document Revised: 09/01/2016 Document Reviewed: 10/14/2015 Elsevier Interactive Patient Education  2018 [employer]  

## 2018-09-22 NOTE — Assessment & Plan Note (Signed)
Avoid offending foods, start probiotics. Do not eat large meals in late evening and consider raising head of bed.  

## 2018-09-22 NOTE — Assessment & Plan Note (Signed)
Patient encouraged to maintain heart healthy diet, regular exercise, adequate sleep. Consider daily probiotics. Take medications as prescribed. Given and reviewed copy of ACP documents from [place] Secretary of State and encouraged to complete and return 

## 2018-09-22 NOTE — Assessment & Plan Note (Signed)
Tolerating statin, encouraged heart healthy diet, avoid trans fats, minimize simple carbs and saturated fats. Increase exercise as tolerated 

## 2018-09-24 NOTE — Assessment & Plan Note (Signed)
He is following closely with Hem/onc no new concerns.

## 2018-09-24 NOTE — Assessment & Plan Note (Signed)
Tolerating Xarelto and rate controlled 

## 2018-09-24 NOTE — Assessment & Plan Note (Signed)
Has not had any chest pain but is given refill on NTG to use prn if pain occurs.

## 2018-09-28 DIAGNOSIS — I10 Essential (primary) hypertension: Secondary | ICD-10-CM

## 2018-09-29 ENCOUNTER — Other Ambulatory Visit: Payer: Self-pay | Admitting: Family Medicine

## 2018-10-02 ENCOUNTER — Other Ambulatory Visit: Payer: Self-pay | Admitting: Cardiology

## 2018-10-02 MED ORDER — AMIODARONE HCL 200 MG PO TABS: 200.0000 mg | ORAL_TABLET | Freq: Every day | ORAL | 1 refills | Status: DC

## 2018-10-09 ENCOUNTER — Encounter: Payer: Medicare HMO | Admitting: Cardiology

## 2018-10-09 ENCOUNTER — Ambulatory Visit (INDEPENDENT_AMBULATORY_CARE_PROVIDER_SITE_OTHER): Payer: Medicare HMO | Admitting: Cardiology

## 2018-10-09 ENCOUNTER — Encounter: Payer: Self-pay | Admitting: Cardiology

## 2018-10-09 VITALS — BP 126/72 | HR 79 | Ht 67.0 in | Wt 204.0 lb

## 2018-10-09 DIAGNOSIS — I480 Paroxysmal atrial fibrillation: Secondary | ICD-10-CM

## 2018-10-09 DIAGNOSIS — I10 Essential (primary) hypertension: Secondary | ICD-10-CM

## 2018-10-09 DIAGNOSIS — I2510 Atherosclerotic heart disease of native coronary artery without angina pectoris: Secondary | ICD-10-CM | POA: Diagnosis not present

## 2018-10-09 DIAGNOSIS — I495 Sick sinus syndrome: Secondary | ICD-10-CM

## 2018-10-09 NOTE — Progress Notes (Signed)
Electrophysiology Office Note   Date:  10/09/2018   ID:  Jason [name], DOB 21-Jan-1946, MRN [id number]  PCP:  Mosie Lukes, MD  Cardiologist:  Dennard Nip Primary Electrophysiologist:  Constance Haw, MD    No chief complaint on file.    History of Present Illness: Jason [name] is a 72 y.o. male who is being seen today for the evaluation of atrial fibrillation at the request of [place]. Presenting today for electrophysiology evaluation.  He has a history of coronary artery disease, paroxysmal atrial fibrillation, tachybradycardia syndrome status post pacemaker, CLL, CVA, hypertension, hyperlipidemia, AAA.  He had a Medtronic dual-chamber pacemaker implanted 04/16/16.   Today, denies symptoms of palpitations, chest pain, shortness of breath, orthopnea, PND, lower extremity edema, claudication, dizziness, presyncope, syncope, bleeding, or neurologic sequela. The patient is tolerating medications without difficulties.  Overall he is doing well.  He is noted no further episodes of atrial fibrillation.  He is unaware that he is going in and out of rhythm.  Otherwise he has no major complaints today.   Past Medical History:  Diagnosis Date  . Anemia 05/08/2015  . CML (chronic myelocytic leukemia) ([place]) 12/16/2011  . Coronary heart disease   . CVA (cerebrovascular accident) ([place])   . GERD (gastroesophageal reflux disease)   . Grief reaction 03/18/2014  . H/O tobacco use, presenting hazards to health 02/24/2012   No cigarettes since pacemaker placement. Encouraged ongoing efforts   . History of chicken pox   . History of kidney stones   . Hyperlipidemia   . Hypertension   . IBS (irritable bowel syndrome) 09/20/2013  . Medicare annual wellness visit, subsequent 03/23/2014  . PAF (paroxysmal atrial fibrillation) ([place])   . Symptomatic bradycardia    a. symptomatic bradycardia due to sinus node dysfunction s/p RA and RV Medtronic pacemaker 04/15/16   Past Surgical  History:  Procedure Laterality Date  . CATARACT EXTRACTION    . CHOLECYSTECTOMY  2003  . CORONARY ANGIOPLASTY WITH STENT PLACEMENT  2000  . EP IMPLANTABLE DEVICE N/A 04/15/2016   Procedure: Pacemaker Implant;  Surgeon: Evans Lance, MD; Medtronic (serial number [id number]) pacemaker; Laterality: Left     Current Outpatient Medications  Medication Sig Dispense Refill  . acetaminophen (TYLENOL) 325 MG tablet Take 650 mg by mouth every 6 (six) hours as needed. For headache.    Marland Kitchen amiodarone (PACERONE) 200 MG tablet Take 1 tablet (200 mg total) by mouth daily. 90 tablet 1  . amLODipine (NORVASC) 5 MG tablet Take 1 tablet (5 mg total) by mouth daily. Take one tablet every day (Keep OV) 90 tablet 3  . atorvastatin (LIPITOR) 40 MG tablet TAKE 1 TABLET EVERY DAY   90 tablet 3  . dicyclomine (BENTYL) 20 MG tablet Take 1 tablet (20 mg total) by mouth 2 (two) times daily. 20 tablet 0  . ferrous sulfate 325 (65 FE) MG EC tablet Take 1 tablet (325 mg total) by mouth 2 (two) times daily. 60 tablet 11  . fluticasone (FLONASE) 50 MCG/ACT nasal spray USE 2 SPRAYS IN EACH NOSTRIL EVERY DAY 48 g 3  . imatinib ([name]) 100 MG tablet TAKE 2 TABLETS BY MOUTH ONCE DAILY WITH MEALS AND  A  LARGE  GLASS  OF  WATER. Please dispense generic as insurance Ifeoma Vallin not pay for brand. 180 tablet 3  . losartan (COZAAR) 100 MG tablet TAKE 1 TABLET EVERY DAY 90 tablet 0  . meclizine (ANTIVERT) 25 MG tablet TAKE 1 TABLET THREE TIMES  DAILY 90 tablet 0  . metoprolol tartrate (LOPRESSOR) 50 MG tablet Take 50 mg by mouth 2 (two) times daily. Take 4 tablets 200 mg twice a day.    . nitroGLYCERIN (NITROSTAT) 0.4 MG SL tablet Place 1 tablet (0.4 mg total) under the tongue every 5 (five) minutes as needed for chest pain. 25 tablet 1  . ofloxacin (FLOXIN OTIC) 0.3 % OTIC solution Place 10 drops into both ears daily. For 7 days 10 mL 0  . omeprazole (PRILOSEC) 40 MG capsule TAKE 1 CAPSULE EVERY DAY 90 capsule 3  . ondansetron (ZOFRAN) 4  MG tablet TAKE 1 TABLET BY MOUTH EVERY 8 HOURS AS NEEDED FOR  NAUSEA  AND  VOMITING 40 tablet 1  . Probiotic Product (PROBIOTIC FORMULA PO) Take 1 capsule by mouth daily.     . ranitidine (ZANTAC) 300 MG tablet TAKE 1 TABLET AT BEDTIME 90 tablet 3  . XARELTO 20 MG TABS tablet TAKE 1 TABLET EVERY DAY WITH SUPPER 90 tablet 1   No current facility-administered medications for this visit.     Allergies:   Patient has no known allergies.   Social History:  The patient  reports that he quit smoking about 2 years ago. His smoking use included cigarettes. He started smoking about 50 years ago. He has a 10.00 pack-year smoking history. He has never used smokeless tobacco. He reports that he does not drink alcohol or use drugs.   Family History:  The patient's family history includes Alcohol abuse in his father; Arthritis in his mother; Cancer in his mother; Diabetes in his son; Heart disease in his mother; Hypertension in his father and mother; Other in his mother; Rheumatic fever in his mother.    ROS:  Please see the history of present illness.   Otherwise, review of systems is positive for none.   All other systems are reviewed and negative.   PHYSICAL EXAM: VS:  BP 126/72   Pulse 79   Ht 5\' 7"  (1.702 m)   Wt 204 lb (92.5 kg)   BMI 31.95 kg/m  , BMI Body mass index is 31.95 kg/m. GEN: Well nourished, well developed, in no acute distress  HEENT: normal  Neck: no JVD, carotid bruits, or masses Cardiac: RRR; no murmurs, rubs, or gallops,no edema  Respiratory:  clear to auscultation bilaterally, normal work of breathing GI: soft, nontender, nondistended, + BS MS: no deformity or atrophy  Skin: warm and dry, device site well healed Neuro:  Strength and sensation are intact Psych: euthymic mood, full affect  EKG:  EKG is ordered today. Personal review of the ekg ordered shows paced, right bundle branch block, left anterior fascicular block, LVH  Personal review of the device interrogation  today. Results in [place]: 09/22/2018: ALT 15; BUN 8; Creatinine, Ser 1.04; Hemoglobin 13.4; Platelets 227.0; Potassium 3.3; Sodium 144; TSH 4.60    Lipid Panel     Component Value Date/Time   CHOL 105 09/22/2018 1056   TRIG 88.0 09/22/2018 1056   HDL 31.80 (L) 09/22/2018 1056   CHOLHDL 3 09/22/2018 1056   VLDL 17.6 09/22/2018 1056   LDLCALC 55 09/22/2018 1056     Wt Readings from Last 3 Encounters:  10/09/18 204 lb (92.5 kg)  09/22/18 207 lb 3.2 oz (94 kg)  06/02/18 202 lb (91.6 kg)      Other studies Reviewed: Additional studies/ records that were reviewed today include: TTE 04/16/16  Review of the above records today demonstrates:  -  Left ventricle: The cavity size was normal. Wall thickness was   normal. Systolic function was mildly to moderately reduced. The   estimated ejection fraction was in the range of 40% to 45%.   Diffuse hypokinesis. Doppler parameters are consistent with   abnormal left ventricular relaxation (grade 1 diastolic   dysfunction). - Aortic valve: Trileaflet; mildly calcified leaflets. There was   mild to moderate regurgitation. - Aorta: Mildly dilated aortic root. Dilated ascending aorta.   Ascending aorta: 44 mm. Aortic root dimension: 37 mm (ED). - Mitral valve: There was no significant regurgitation. - Right ventricle: The cavity size was normal. Pacer wire or   catheter noted in right ventricle. Systolic function was normal. - Pulmonary arteries: No complete TR doppler jet so unable to   estimate PA systolic pressure. - Inferior vena cava: The vessel was normal in size. The   respirophasic diameter changes were in the normal range (>= 50%),   consistent with normal central venous pressure.   ASSESSMENT AND PLAN:  1.  Tachybradycardia syndrome: Medtronic dual-chamber pacemaker implanted in 2017.  Functioning appropriately.  No changes.  2.  Paroxysmal atrial fibrillation: Currently on Xarelto and amiodarone.  He continues  to have episodes of atrial fibrillation.  We Dak Szumski decrease his amiodarone to 200 mg daily.  If he becomes more symptomatic, would likely plan for ablation.  This patients CHA2DS2-VASc Score and unadjusted Ischemic Stroke Rate (% per year) is equal to 7.2 % stroke rate/year from a score of 5  Above score calculated as 1 point each if present [CHF, HTN, DM, Vascular=MI/PAD/Aortic Plaque, Age if 65-74, or Male] Above score calculated as 2 points each if present [Age > 75, or Stroke/TIA/TE]  3.  Coronary artery disease: Aspirin, beta-blocker statin.  No current chest pain.  No changes.  4.  Hypertension: Well controlled today.  No changes. Current medicines are reviewed at length with the patient today.   The patient does not have concerns regarding his medicines.  The following changes were made today: Decrease amiodarone  Labs/ tests ordered today include:  Orders Placed This Encounter  Procedures  . EKG 12-Lead     Disposition:   FU with Caelen Reierson 6 months  Signed, Keahi Mccarney Meredith Leeds, MD  10/09/2018 3:32 PM     [address] [place] [address] [phone] (office) [phone] (fax)

## 2018-10-09 NOTE — Patient Instructions (Signed)
Medication Instructions:  Your physician recommends that you continue on your current medications as directed. Please refer to the Current Medication list given to you today.  If you need a refill on your cardiac medications before your next appointment, please call your pharmacy.   Lab work: None ordered  Testing/Procedures: None ordered  Follow-Up: At [facility], you and your health needs are our priority.  As part of our continuing mission to provide you with exceptional heart care, we have created designated Provider Care Teams.  These Care Teams include your primary Cardiologist (physician) and Advanced Practice Providers (APPs -  Physician Assistants and Nurse Practitioners) who all work together to provide you with the care you need, when you need it. . You will need a follow up appointment in 6 months with Dr. Curt Bears  Thank you for choosing [facility]!!   Trinidad Curet, RN [phone]

## 2018-10-10 ENCOUNTER — Other Ambulatory Visit: Payer: Self-pay | Admitting: Family Medicine

## 2018-10-16 ENCOUNTER — Telehealth: Payer: Self-pay | Admitting: Family Medicine

## 2018-10-16 ENCOUNTER — Encounter: Payer: Self-pay | Admitting: Internal Medicine

## 2018-10-16 NOTE — Telephone Encounter (Signed)
Copied from [place] [id number]. Topic: General - Other >> Oct 16, 2018 11:57 AM Lennox Solders wrote: Reason for VOH:[id number] pharm tech at [facility] mail order is calling the patient ask them to reach out to his doctor for a new rx to humana ondansetron 4 mg ODT #90 w/refills

## 2018-10-17 MED ORDER — ONDANSETRON HCL 4 MG PO TABS: ORAL_TABLET | ORAL | 1 refills | Status: DC

## 2018-10-17 NOTE — Telephone Encounter (Signed)
rx sent in 

## 2018-10-25 ENCOUNTER — Other Ambulatory Visit: Payer: Self-pay | Admitting: Family Medicine

## 2018-11-13 ENCOUNTER — Ambulatory Visit: Payer: Medicare HMO

## 2018-11-14 NOTE — Progress Notes (Signed)
Opened in error

## 2018-11-16 ENCOUNTER — Encounter: Payer: Self-pay | Admitting: Cardiology

## 2018-11-19 LAB — CUP PACEART INCLINIC DEVICE CHECK
Date Time Interrogation Session: 20191124204554
Implantable Lead Implant Date: 20170420
Implantable Lead Location: 753859
Implantable Lead Model: 5076
MDC IDC LEAD IMPLANT DT: 20170420
MDC IDC LEAD LOCATION: 753860
MDC IDC PG IMPLANT DT: 20170420

## 2018-11-20 ENCOUNTER — Other Ambulatory Visit: Payer: Self-pay | Admitting: *Deleted

## 2018-11-20 DIAGNOSIS — I714 Abdominal aortic aneurysm, without rupture, unspecified: Secondary | ICD-10-CM

## 2018-11-20 NOTE — Progress Notes (Signed)
US

## 2018-11-21 ENCOUNTER — Telehealth: Payer: Self-pay | Admitting: *Deleted

## 2018-11-21 NOTE — Telephone Encounter (Signed)
Left message for patient to call and schedule US aorta at [facility]

## 2018-11-22 ENCOUNTER — Other Ambulatory Visit: Payer: Self-pay | Admitting: Family Medicine

## 2018-11-28 ENCOUNTER — Ambulatory Visit (INDEPENDENT_AMBULATORY_CARE_PROVIDER_SITE_OTHER): Payer: Medicare HMO

## 2018-11-28 DIAGNOSIS — I495 Sick sinus syndrome: Secondary | ICD-10-CM | POA: Diagnosis not present

## 2018-11-29 NOTE — Progress Notes (Signed)
Remote pacemaker transmission.   

## 2018-12-01 ENCOUNTER — Ambulatory Visit (HOSPITAL_BASED_OUTPATIENT_CLINIC_OR_DEPARTMENT_OTHER)
Admission: RE | Admit: 2018-12-01 | Discharge: 2018-12-01 | Disposition: A | Discharge status: Home / Self Care | Payer: Medicare HMO | Source: Ambulatory Visit | Attending: Cardiology | Admitting: Cardiology

## 2018-12-01 ENCOUNTER — Inpatient Hospital Stay: Payer: Medicare HMO | Attending: Hematology & Oncology | Admitting: Hematology & Oncology

## 2018-12-01 ENCOUNTER — Inpatient Hospital Stay: Payer: Medicare HMO

## 2018-12-01 DIAGNOSIS — I480 Paroxysmal atrial fibrillation: Secondary | ICD-10-CM | POA: Insufficient documentation

## 2018-12-01 DIAGNOSIS — I714 Abdominal aortic aneurysm, without rupture, unspecified: Secondary | ICD-10-CM

## 2018-12-01 DIAGNOSIS — Z79899 Other long term (current) drug therapy: Secondary | ICD-10-CM | POA: Insufficient documentation

## 2018-12-01 DIAGNOSIS — Z7901 Long term (current) use of anticoagulants: Secondary | ICD-10-CM | POA: Insufficient documentation

## 2018-12-01 DIAGNOSIS — C9210 Chronic myeloid leukemia, BCR/ABL-positive, not having achieved remission: Secondary | ICD-10-CM | POA: Insufficient documentation

## 2018-12-01 DIAGNOSIS — Z950 Presence of cardiac pacemaker: Secondary | ICD-10-CM | POA: Insufficient documentation

## 2018-12-04 ENCOUNTER — Telehealth: Payer: Self-pay | Admitting: *Deleted

## 2018-12-04 DIAGNOSIS — I714 Abdominal aortic aneurysm, without rupture, unspecified: Secondary | ICD-10-CM

## 2018-12-04 NOTE — Telephone Encounter (Addendum)
Spoke with pt, Aware of dr Jacalyn Lefevre recommendations. Order placed and patient will call and schedule.   ----- Message from Lelon Perla, MD sent at 12/04/2018  6:44 AM EST ----- CTA of abdomen and pelvis to evaluate AAA Kirk Ruths

## 2018-12-05 ENCOUNTER — Encounter: Payer: Self-pay | Admitting: Cardiology

## 2018-12-05 NOTE — Telephone Encounter (Signed)
Left message for patient to call and schedule CTA Abd and Pelvis at [facility] ordered by [name].

## 2018-12-11 ENCOUNTER — Telehealth: Payer: Self-pay | Admitting: Cardiology

## 2018-12-11 ENCOUNTER — Other Ambulatory Visit: Payer: Self-pay | Admitting: *Deleted

## 2018-12-11 DIAGNOSIS — I480 Paroxysmal atrial fibrillation: Secondary | ICD-10-CM

## 2018-12-11 NOTE — Telephone Encounter (Signed)
Left message for patient to call and schedule kf 

## 2018-12-11 NOTE — Telephone Encounter (Signed)
Patient would like to know why he is having another CT scan

## 2018-12-11 NOTE — Telephone Encounter (Signed)
Spoke with patient and discussed CT with patient

## 2018-12-12 DIAGNOSIS — I480 Paroxysmal atrial fibrillation: Secondary | ICD-10-CM | POA: Diagnosis not present

## 2018-12-13 ENCOUNTER — Encounter: Payer: Self-pay | Admitting: *Deleted

## 2018-12-13 LAB — BASIC METABOLIC PANEL
BUN/Creatinine Ratio: 7 — ABNORMAL LOW (ref 10–24)
BUN: 8 mg/dL (ref 8–27)
CALCIUM: 8.6 mg/dL (ref 8.6–10.2)
CHLORIDE: 103 mmol/L (ref 96–106)
CO2: 23 mmol/L (ref 20–29)
Creatinine, Ser: 1.11 mg/dL (ref 0.76–1.27)
GFR calc non Af Amer: 67 mL/min/{1.73_m2} (ref >59)
GFR, EST AFRICAN AMERICAN: 77 mL/min/{1.73_m2} (ref >59)
Glucose: 80 mg/dL (ref 65–99)
POTASSIUM: 3.5 mmol/L (ref 3.5–5.2)
Sodium: 142 mmol/L (ref 134–144)

## 2018-12-14 ENCOUNTER — Other Ambulatory Visit: Payer: Self-pay | Admitting: Cardiology

## 2018-12-14 DIAGNOSIS — I714 Abdominal aortic aneurysm, without rupture, unspecified: Secondary | ICD-10-CM

## 2018-12-18 ENCOUNTER — Encounter (HOSPITAL_BASED_OUTPATIENT_CLINIC_OR_DEPARTMENT_OTHER): Payer: Self-pay

## 2018-12-18 ENCOUNTER — Ambulatory Visit (HOSPITAL_BASED_OUTPATIENT_CLINIC_OR_DEPARTMENT_OTHER)
Admission: RE | Admit: 2018-12-18 | Discharge: 2018-12-18 | Disposition: A | Discharge status: Home / Self Care | Payer: Medicare HMO | Source: Ambulatory Visit | Attending: Cardiology | Admitting: Cardiology

## 2018-12-18 ENCOUNTER — Ambulatory Visit: Payer: Medicare HMO

## 2018-12-18 ENCOUNTER — Other Ambulatory Visit: Payer: Self-pay | Admitting: Family Medicine

## 2018-12-18 DIAGNOSIS — I714 Abdominal aortic aneurysm, without rupture, unspecified: Secondary | ICD-10-CM

## 2018-12-18 MED ORDER — IOPAMIDOL (ISOVUE-370) INJECTION 76%
100.0000 mL | Freq: Once | INTRAVENOUS | Status: AC | PRN
  Administered 2018-12-18: 100 mL via INTRAVENOUS

## 2018-12-19 ENCOUNTER — Encounter: Payer: Self-pay | Admitting: Hematology & Oncology

## 2018-12-19 ENCOUNTER — Other Ambulatory Visit: Payer: Self-pay

## 2018-12-19 ENCOUNTER — Inpatient Hospital Stay: Payer: Medicare HMO

## 2018-12-19 ENCOUNTER — Inpatient Hospital Stay (HOSPITAL_BASED_OUTPATIENT_CLINIC_OR_DEPARTMENT_OTHER): Payer: Medicare HMO | Admitting: Hematology & Oncology

## 2018-12-19 VITALS — BP 110/70 | HR 91 | Temp 98.5°F | Resp 18 | Wt 200.0 lb

## 2018-12-19 DIAGNOSIS — C9210 Chronic myeloid leukemia, BCR/ABL-positive, not having achieved remission: Secondary | ICD-10-CM

## 2018-12-19 DIAGNOSIS — Z79899 Other long term (current) drug therapy: Secondary | ICD-10-CM | POA: Diagnosis not present

## 2018-12-19 DIAGNOSIS — I480 Paroxysmal atrial fibrillation: Secondary | ICD-10-CM

## 2018-12-19 DIAGNOSIS — Z7901 Long term (current) use of anticoagulants: Secondary | ICD-10-CM

## 2018-12-19 DIAGNOSIS — Z950 Presence of cardiac pacemaker: Secondary | ICD-10-CM

## 2018-12-19 LAB — CMP (CANCER CENTER ONLY)
ALBUMIN: 4.1 g/dL (ref 3.5–5.0)
ALT: 19 U/L (ref 0–44)
AST: 26 U/L (ref 15–41)
Alkaline Phosphatase: 94 U/L (ref 38–126)
Anion gap: 8 (ref 5–15)
BUN: 8 mg/dL (ref 8–23)
CO2: 28 mmol/L (ref 22–32)
Calcium: 8.6 mg/dL — ABNORMAL LOW (ref 8.9–10.3)
Chloride: 105 mmol/L (ref 98–111)
Creatinine: 1.19 mg/dL (ref 0.61–1.24)
GFR, Est AFR Am: >60 mL/min (ref >60)
GFR, Estimated: >60 mL/min (ref >60)
GLUCOSE: 98 mg/dL (ref 70–99)
Potassium: 3.9 mmol/L (ref 3.5–5.1)
Sodium: 141 mmol/L (ref 135–145)
Total Bilirubin: 0.6 mg/dL (ref 0.3–1.2)
Total Protein: 6.5 g/dL (ref 6.5–8.1)

## 2018-12-19 LAB — CBC WITH DIFFERENTIAL (CANCER CENTER ONLY)
Abs Immature Granulocytes: 0.03 10*3/uL (ref 0.00–0.07)
BASOS PCT: 2 %
Basophils Absolute: 0.2 10*3/uL — ABNORMAL HIGH (ref 0.0–0.1)
Eosinophils Absolute: 0.4 10*3/uL (ref 0.0–0.5)
Eosinophils Relative: 5 %
HCT: 39.7 % (ref 39.0–52.0)
Hemoglobin: 12.6 g/dL — ABNORMAL LOW (ref 13.0–17.0)
Immature Granulocytes: 0 %
Lymphocytes Relative: 25 %
Lymphs Abs: 2.4 10*3/uL (ref 0.7–4.0)
MCH: 30.7 pg (ref 26.0–34.0)
MCHC: 31.7 g/dL (ref 30.0–36.0)
MCV: 96.6 fL (ref 80.0–100.0)
Monocytes Absolute: 0.8 10*3/uL (ref 0.1–1.0)
Monocytes Relative: 9 %
Neutro Abs: 5.5 10*3/uL (ref 1.7–7.7)
Neutrophils Relative %: 59 %
Platelet Count: 281 10*3/uL (ref 150–400)
RBC: 4.11 MIL/uL — ABNORMAL LOW (ref 4.22–5.81)
RDW: 14.3 % (ref 11.5–15.5)
WBC Count: 9.3 10*3/uL (ref 4.0–10.5)
nRBC: 0 % (ref 0.0–0.2)

## 2018-12-19 NOTE — Progress Notes (Signed)
Hematology and Oncology Follow Up Visit  Jason [name] [id number] Apr 30, 1946 72 y.o. 12/19/2018   Principle Diagnosis:  Chronic phase CML - MMR Paroxysmal atrial fibrillation - has pacemaker   Current Therapy:   [name] 200 mg by mouth daily Xarelto 20 mg by mouth daily   Interim History:  Jason [name] is here today for follow-up.  We last saw him back in June.  Since then, he has been doing pretty well.  He does have atrial fibrillation.  He does have a pacemaker in.  He is on Xarelto.  He had a CT angiogram of his abdomen yesterday.  This is to look for growth in his aortic aneurysm.  His AAA measures 4.5 cm.  [name] of cardiology is managing this.  As far as his CML is concerned, we saw him in June, his BCR/ABL ratio was only 0.006%.  As such, he was in a MMR.  He has been in a major molecular remission now for 5 years.  As such, I might consider stopping the [name] on him.  He has had no problems with [name].  He is not smoking.  There is no change in bowel or bladder habits.  He has had no rashes.  There is been no leg swelling.  Overall, his performance status is ECOG 1.   Medications:  Allergies as of 12/19/2018   No Known Allergies     Medication List       Accurate as of December 19, 2018 10:15 AM. Always use your most recent med list.        acetaminophen 325 MG tablet Commonly known as:  TYLENOL Take 650 mg by mouth every 6 (six) hours as needed. For headache.   amiodarone 200 MG tablet Commonly known as:  PACERONE Take 1 tablet (200 mg total) by mouth daily.   amLODipine 5 MG tablet Commonly known as:  NORVASC Take 1 tablet (5 mg total) by mouth daily. Take one tablet every day (Keep OV)   atorvastatin 40 MG tablet Commonly known as:  LIPITOR TAKE 1 TABLET EVERY DAY   dicyclomine 20 MG tablet Commonly known as:  BENTYL Take 1 tablet (20 mg total) by mouth 2 (two) times daily.   ferrous sulfate 325 (65 FE) MG EC tablet Take 1 tablet  (325 mg total) by mouth 2 (two) times daily.   fluticasone 50 MCG/ACT nasal spray Commonly known as:  FLONASE USE 2 SPRAYS IN EACH NOSTRIL EVERY DAY   imatinib 100 MG tablet Commonly known as:  [name] TAKE 2 TABLETS BY MOUTH ONCE DAILY WITH MEALS AND  A  LARGE  GLASS  OF  WATER. Please dispense generic as insurance will not pay for brand.   losartan 100 MG tablet Commonly known as:  COZAAR TAKE 1 TABLET EVERY DAY   meclizine 25 MG tablet Commonly known as:  ANTIVERT TAKE 1 TABLET THREE TIMES DAILY   metoprolol tartrate 50 MG tablet Commonly known as:  LOPRESSOR Take 50 mg by mouth 2 (two) times daily. Take 4 tablets 200 mg twice a day.   nitroGLYCERIN 0.4 MG SL tablet Commonly known as:  NITROSTAT Place 1 tablet (0.4 mg total) under the tongue every 5 (five) minutes as needed for chest pain.   ofloxacin 0.3 % OTIC solution Commonly known as:  FLOXIN OTIC Place 10 drops into both ears daily. For 7 days   omeprazole 40 MG capsule Commonly known as:  PRILOSEC TAKE 1 CAPSULE EVERY DAY   ondansetron 4  MG tablet Commonly known as:  ZOFRAN TAKE 1 TABLET BY MOUTH EVERY 8 HOURS AS NEEDED FOR  NAUSEA  AND  VOMITING   PROBIOTIC FORMULA PO Take 1 capsule by mouth daily.   ranitidine 300 MG tablet Commonly known as:  ZANTAC TAKE 1 TABLET AT BEDTIME   XARELTO 20 MG Tabs tablet Generic drug:  rivaroxaban TAKE 1 TABLET EVERY DAY WITH SUPPER       Allergies: No Known Allergies  Past Medical History, Surgical history, Social history, and Family History were reviewed and updated.  Review of Systems: Review of Systems  Constitutional: Negative.   HENT: Negative.   Eyes: Negative.   Respiratory: Negative.   Cardiovascular: Negative.   Gastrointestinal: Negative.   Genitourinary: Negative.   Musculoskeletal: Negative.   Skin: Negative.   Neurological: Negative.   Endo/Heme/Allergies: Negative.   Psychiatric/Behavioral: Negative.      Physical Exam:  weight is 200  lb (90.7 kg). His oral temperature is 98.5 F (36.9 C). His blood pressure is 110/70 and his pulse is 91. His respiration is 18 and oxygen saturation is 95%.   Wt Readings from Last 3 Encounters:  12/19/18 200 lb (90.7 kg)  10/09/18 204 lb (92.5 kg)  09/22/18 207 lb 3.2 oz (94 kg)    Physical Exam Vitals signs reviewed.  HENT:     Head: Normocephalic and atraumatic.  Eyes:     Pupils: Pupils are equal, round, and reactive to light.  Neck:     Musculoskeletal: Normal range of motion.  Cardiovascular:     Rate and Rhythm: Normal rate and regular rhythm.     Heart sounds: Normal heart sounds.  Pulmonary:     Effort: Pulmonary effort is normal.     Breath sounds: Normal breath sounds.  Abdominal:     General: Bowel sounds are normal.     Palpations: Abdomen is soft.  Musculoskeletal: Normal range of motion.        General: No tenderness or deformity.  Lymphadenopathy:     Cervical: No cervical adenopathy.  Skin:    General: Skin is warm and dry.     Findings: No erythema or rash.  Neurological:     Mental Status: He is alert and oriented to person, place, and time.  Psychiatric:        Behavior: Behavior normal.        Thought Content: Thought content normal.        Judgment: Judgment normal.      Lab Results  Component Value Date   WBC 9.3 12/19/2018   HGB 12.6 (L) 12/19/2018   HCT 39.7 12/19/2018   MCV 96.6 12/19/2018   PLT 281 12/19/2018   Lab Results  Component Value Date   IRON 84 05/27/2015   TIBC 359 05/27/2015   UIBC 275 05/27/2015   IRONPCTSAT 23 05/27/2015   Lab Results  Component Value Date   RETICCTPCT 1.2 05/27/2015   RBC 4.11 (L) 12/19/2018   RETICCTABS 48.2 05/27/2015   No results found for: KPAFRELGTCHN, LAMBDASER, KAPLAMBRATIO No results found for: IGGSERUM, IGA, IGMSERUM No results found for: Ronnald Ramp, A1GS, A2GS, Tillman Sers, SPEI   Chemistry      Component Value Date/Time   NA 141 12/19/2018 0938    NA 142 12/12/2018 1308   NA 143 11/26/2016 1030   K 3.9 12/19/2018 0938   K 3.8 11/26/2016 1030   CL 105 12/19/2018 0938   CL 109 (H) 03/31/2015 1119   CO2  28 12/19/2018 0938   CO2 25 11/26/2016 1030   BUN 8 12/19/2018 0938   BUN 8 12/12/2018 1308   BUN 5.1 (L) 11/26/2016 1030   CREATININE 1.19 12/19/2018 0938   CREATININE 1.0 11/26/2016 1030      Component Value Date/Time   CALCIUM 8.6 (L) 12/19/2018 0938   CALCIUM 9.3 11/26/2016 1030   ALKPHOS 94 12/19/2018 0938   ALKPHOS 106 11/26/2016 1030   AST 26 12/19/2018 0938   AST 16 11/26/2016 1030   ALT 19 12/19/2018 0938   ALT 10 11/26/2016 1030   BILITOT 0.6 12/19/2018 0938   BILITOT 0.53 11/26/2016 1030      Impression and Plan: Mr. Coey is a very pleasant 72 yo caucasian male with chronic phase CML.  He is in a major molecular remission.  Again, he has been in a major molecular remission for 5 years.  I will see him back in 6 months.  If he still is in a major molecular remission, then we will stop the [name].  Hopefully, he will not need surgery for the AAA.  If he does need surgery, I do not see any problems with him stopping the [name].  He is doing well with [name].     Volanda Napoleon, MD 12/24/201910:15 AM

## 2018-12-27 LAB — CUP PACEART REMOTE DEVICE CHECK
Battery Remaining Longevity: 88 mo
Battery Voltage: 3.01 V
Brady Statistic AP VP Percent: 1.09 %
Brady Statistic AS VP Percent: 5.57 %
Brady Statistic AS VS Percent: 2.06 %
Brady Statistic RA Percent Paced: 90.67 %
Brady Statistic RV Percent Paced: 6.62 %
Date Time Interrogation Session: 20191203180702
Implantable Lead Implant Date: 20170420
Implantable Lead Implant Date: 20170420
Implantable Lead Location: 753859
Implantable Lead Location: 753860
Implantable Lead Model: 5076
Implantable Lead Model: 5076
Implantable Pulse Generator Implant Date: 20170420
Lead Channel Impedance Value: 342 Ohm
Lead Channel Impedance Value: 361 Ohm
Lead Channel Impedance Value: 399 Ohm
Lead Channel Impedance Value: 418 Ohm
Lead Channel Pacing Threshold Amplitude: 0.625 V
Lead Channel Pacing Threshold Amplitude: 0.625 V
Lead Channel Pacing Threshold Pulse Width: 0.4 ms
Lead Channel Pacing Threshold Pulse Width: 0.4 ms
Lead Channel Sensing Intrinsic Amplitude: 10.375 mV
Lead Channel Sensing Intrinsic Amplitude: 2.375 mV
Lead Channel Setting Pacing Amplitude: 2 V
Lead Channel Setting Pacing Pulse Width: 0.4 ms
Lead Channel Setting Sensing Sensitivity: 2 mV
MDC IDC MSMT LEADCHNL RA SENSING INTR AMPL: 2.375 mV
MDC IDC MSMT LEADCHNL RV SENSING INTR AMPL: 10.375 mV
MDC IDC SET LEADCHNL RV PACING AMPLITUDE: 2.5 V
MDC IDC STAT BRADY AP VS PERCENT: 91.27 %

## 2019-01-08 LAB — BCR/ABL

## 2019-01-08 NOTE — Progress Notes (Signed)
HPI: FU coronary artery disease and atrial fibrillation. Patient had stents placed at [facility] in 2000. Records are not available. Patient seen preoperatively at [facility] on September 01 2012 and electrocardiogram showed atrial fibrillation. TSH in September of 2013 was 1.612. Patient has been maintained on anticoagulation. Monitor April 2016 showed paroxysmal atrial fibrillation and Toprol increased.Echocardiogram repeated April 2017 and showed an ejection fraction of 40-45%, mild to moderate aortic insufficiency and mildly dilated aortic root. Nuclear study April 2017 showed ejection fraction 43%, inferior and lateral infarct but no ischemia. Has had pacemaker placed secondary to tachybradycardia syndrome.  Abdominal CT December 2019 showed 4.5 cm abdominal aortic aneurysm.  Since last seen,the patient has dyspnea with more extreme activities but not with routine activities. It is relieved with rest. It is not associated with chest pain. There is no orthopnea, PND or pedal edema. There is no syncope or palpitations. There is no exertional chest pain.   Current Outpatient Medications  Medication Sig Dispense Refill  . acetaminophen (TYLENOL) 325 MG tablet Take 650 mg by mouth every 6 (six) hours as needed. For headache.    Marland Kitchen amiodarone (PACERONE) 200 MG tablet Take 1 tablet (200 mg total) by mouth daily. 90 tablet 1  . amLODipine (NORVASC) 5 MG tablet Take 1 tablet (5 mg total) by mouth daily. Take one tablet every day (Keep OV) 90 tablet 3  . atorvastatin (LIPITOR) 40 MG tablet TAKE 1 TABLET EVERY DAY   90 tablet 3  . ferrous sulfate 325 (65 FE) MG EC tablet Take 1 tablet (325 mg total) by mouth 2 (two) times daily. 60 tablet 11  . fluticasone (FLONASE) 50 MCG/ACT nasal spray USE 2 SPRAYS IN EACH NOSTRIL EVERY DAY 48 g 3  . imatinib ([name]) 100 MG tablet TAKE 2 TABLETS BY MOUTH ONCE DAILY WITH MEALS AND  A  LARGE  GLASS  OF  WATER. Please dispense generic as insurance  will not pay for brand. 180 tablet 3  . losartan (COZAAR) 100 MG tablet TAKE 1 TABLET EVERY DAY 90 tablet 0  . meclizine (ANTIVERT) 25 MG tablet TAKE 1 TABLET THREE TIMES DAILY 90 tablet 0  . metoprolol tartrate (LOPRESSOR) 50 MG tablet Take 50 mg by mouth 2 (two) times daily. Take 4 tablets 200 mg twice a day.    . nitroGLYCERIN (NITROSTAT) 0.4 MG SL tablet Place 1 tablet (0.4 mg total) under the tongue every 5 (five) minutes as needed for chest pain. 25 tablet 1  . ofloxacin (FLOXIN OTIC) 0.3 % OTIC solution Place 10 drops into both ears daily. For 7 days 10 mL 0  . omeprazole (PRILOSEC) 40 MG capsule TAKE 1 CAPSULE EVERY DAY 90 capsule 3  . ondansetron (ZOFRAN) 4 MG tablet TAKE 1 TABLET BY MOUTH EVERY 8 HOURS AS NEEDED FOR  NAUSEA  AND  VOMITING 40 tablet 1  . Probiotic Product (PROBIOTIC FORMULA PO) Take 1 capsule by mouth daily.     . ranitidine (ZANTAC) 300 MG tablet TAKE 1 TABLET AT BEDTIME 90 tablet 3  . XARELTO 20 MG TABS tablet TAKE 1 TABLET EVERY DAY WITH SUPPER 90 tablet 1  . dicyclomine (BENTYL) 20 MG tablet Take 1 tablet (20 mg total) by mouth 2 (two) times daily. 20 tablet 0   No current facility-administered medications for this visit.      Past Medical History:  Diagnosis Date  . Anemia 05/08/2015  . CML (chronic myelocytic leukemia) ([place]) 12/16/2011  .  Coronary heart disease   . CVA (cerebrovascular accident) ([place])   . GERD (gastroesophageal reflux disease)   . Grief reaction 03/18/2014  . H/O tobacco use, presenting hazards to health 02/24/2012   No cigarettes since pacemaker placement. Encouraged ongoing efforts   . History of chicken pox   . History of kidney stones   . Hyperlipidemia   . Hypertension   . IBS (irritable bowel syndrome) 09/20/2013  . Medicare annual wellness visit, subsequent 03/23/2014  . PAF (paroxysmal atrial fibrillation) ([place])   . Symptomatic bradycardia    a. symptomatic bradycardia due to sinus node dysfunction s/p RA and RV Medtronic  pacemaker 04/15/16    Past Surgical History:  Procedure Laterality Date  . CATARACT EXTRACTION    . CHOLECYSTECTOMY  2003  . CORONARY ANGIOPLASTY WITH STENT PLACEMENT  2000  . EP IMPLANTABLE DEVICE N/A 04/15/2016   Procedure: Pacemaker Implant;  Surgeon: Evans Lance, MD; Medtronic (serial number [id number]) pacemaker; Laterality: Left    Social History   Socioeconomic History  . Marital status: Widowed    Spouse name: Not on file  . Number of children: 3  . Years of education: Not on file  . Highest education level: Not on file  Occupational History  . Occupation: Retired    Comment: Retired  Scientific laboratory technician  . Financial resource strain: Not on file  . Food insecurity:    Worry: Not on file    Inability: Not on file  . Transportation needs:    Medical: Not on file    Non-medical: Not on file  Tobacco Use  . Smoking status: Former Smoker    Packs/day: 0.25    Years: 40.00    Pack years: 10.00    Types: Cigarettes    Start date: 10/30/1967    Last attempt to quit: 04/14/2016    Years since quitting: 2.7  . Smokeless tobacco: Never Used  . Tobacco comment: 04-16 quit smoking  Substance and Sexual Activity  . Alcohol use: No    Alcohol/week: 0.0 standard drinks    Comment: Rare  . Drug use: No  . Sexual activity: Never    Comment: wife died in 01/26/2023 married 40 years, grandson livew with patient  Lifestyle  . Physical activity:    Days per week: Not on file    Minutes per session: Not on file  . Stress: Not on file  Relationships  . Social connections:    Talks on phone: Not on file    Gets together: Not on file    Attends religious service: Not on file    Active member of club or organization: Not on file    Attends meetings of clubs or organizations: Not on file    Relationship status: Not on file  . Intimate partner violence:    Fear of current or ex partner: Not on file    Emotionally abused: Not on file    Physically abused: Not on file    Forced sexual  activity: Not on file  Other Topics Concern  . Not on file  Social History Narrative  . Not on file    Family History  Problem Relation Age of Onset  . Arthritis Mother   . Hypertension Mother   . Heart disease Mother        Rheumatic fever  . Rheumatic fever Mother   . Other Mother        brain tumor  . Cancer Mother  leukemia  . Hypertension Father   . Alcohol abuse Father   . Diabetes Son   . Colon cancer Neg Hx   . Breast cancer Neg Hx   . Prostate cancer Neg Hx     ROS: no fevers or chills, productive cough, hemoptysis, dysphasia, odynophagia, melena, hematochezia, dysuria, hematuria, rash, seizure activity, orthopnea, PND, pedal edema, claudication. Remaining systems are negative.  Physical Exam: Well-developed well-nourished in no acute distress.  Skin is warm and dry.  HEENT is normal.  Neck is supple.  Chest diminished breath sounds Cardiovascular exam is regular rate and rhythm.  Distant heart sounds Abdominal exam nontender or distended. No masses palpated. Extremities show no edema. neuro grossly intact  ECG-sinus rhythm at a rate of 67.  First-degree AV block.  Left anterior fascicular block.  Right bundle branch block.  Left ventricular hypertrophy.  Personally reviewed  A/P  1 coronary artery disease-patient doing well with no chest pain.  Continue medical therapy with statin.  No aspirin given need for anticoagulation.  2 paroxysmal atrial fibrillation-patient remains in sinus rhythm today.  We will continue present dose of beta-blocker.  Continue Xarelto.  Continue amiodarone.  Check chest x-ray.  3 hypertension-patient's blood pressure is borderline.  I have asked him to follow this at home and we will increase medications as needed.  4 hyperlipidemia-continue statin.    5 tobacco abuse-patient has discontinued.  6 history of pacemaker-followed by electrophysiology.  7 abdominal aortic aneurysm-patient will need follow-up ultrasound  December 2020.  8 history of mild to moderate aortic insufficiency-repeat echocardiogram.  Kirk Ruths, MD

## 2019-01-10 ENCOUNTER — Ambulatory Visit (INDEPENDENT_AMBULATORY_CARE_PROVIDER_SITE_OTHER): Payer: Medicare HMO | Admitting: Cardiology

## 2019-01-10 ENCOUNTER — Encounter: Payer: Self-pay | Admitting: Cardiology

## 2019-01-10 VITALS — BP 142/88 | HR 67 | Ht 67.0 in | Wt 202.8 lb

## 2019-01-10 DIAGNOSIS — I059 Rheumatic mitral valve disease, unspecified: Secondary | ICD-10-CM

## 2019-01-10 DIAGNOSIS — I10 Essential (primary) hypertension: Secondary | ICD-10-CM

## 2019-01-10 DIAGNOSIS — I480 Paroxysmal atrial fibrillation: Secondary | ICD-10-CM

## 2019-01-10 DIAGNOSIS — I351 Nonrheumatic aortic (valve) insufficiency: Secondary | ICD-10-CM | POA: Diagnosis not present

## 2019-01-10 NOTE — Patient Instructions (Signed)
Medication Instructions:   NO CHANGE  Testing/Procedures:  A chest x-ray takes a picture of the organs and structures inside the chest, including the heart, lungs, and blood vessels. This test can show several things, including, whether the heart is enlarges; whether fluid is building up in the lungs; and whether pacemaker / defibrillator leads are still in place. [facility]  Your physician has requested that you have an echocardiogram. Echocardiography is a painless test that uses sound waves to create images of your heart. It provides your doctor with information about the size and shape of your heart and how well your heart's chambers and valves are working. This procedure takes approximately one hour. There are no restrictions for this procedure.  [facility]  Follow-Up:  Your physician recommends that you schedule a follow-up appointment in: [place] A CALL 2 MONTHS PRIOR TO THAT APPOINTMENT TIME TO SCHEDULE   CALL IN MAY FOR AN APPOINTMENT IN [place]

## 2019-01-11 ENCOUNTER — Other Ambulatory Visit: Payer: Self-pay | Admitting: Family Medicine

## 2019-01-12 ENCOUNTER — Other Ambulatory Visit: Payer: Self-pay | Admitting: Family Medicine

## 2019-02-02 ENCOUNTER — Other Ambulatory Visit: Payer: Self-pay | Admitting: Family Medicine

## 2019-02-14 ENCOUNTER — Other Ambulatory Visit: Payer: Self-pay | Admitting: Cardiology

## 2019-02-14 ENCOUNTER — Other Ambulatory Visit: Payer: Self-pay | Admitting: Family Medicine

## 2019-02-16 ENCOUNTER — Encounter: Payer: Self-pay | Admitting: Hematology & Oncology

## 2019-02-20 ENCOUNTER — Ambulatory Visit (HOSPITAL_BASED_OUTPATIENT_CLINIC_OR_DEPARTMENT_OTHER): Payer: Medicare HMO

## 2019-02-21 ENCOUNTER — Other Ambulatory Visit: Payer: Self-pay | Admitting: Family Medicine

## 2019-02-21 ENCOUNTER — Encounter: Payer: Self-pay | Admitting: *Deleted

## 2019-02-27 ENCOUNTER — Ambulatory Visit (INDEPENDENT_AMBULATORY_CARE_PROVIDER_SITE_OTHER): Payer: Medicare HMO | Admitting: *Deleted

## 2019-02-27 DIAGNOSIS — I495 Sick sinus syndrome: Secondary | ICD-10-CM

## 2019-02-28 LAB — CUP PACEART REMOTE DEVICE CHECK
Battery Voltage: 3.02 V
Brady Statistic AP VP Percent: 1.22 %
Brady Statistic AP VS Percent: 93.91 %
Brady Statistic AS VP Percent: 1.33 %
Brady Statistic AS VS Percent: 3.54 %
Brady Statistic RA Percent Paced: 93.95 %
Brady Statistic RV Percent Paced: 2.58 %
Implantable Lead Implant Date: 20170420
Implantable Lead Implant Date: 20170420
Implantable Lead Location: 753859
Implantable Lead Location: 753860
Implantable Lead Model: 5076
Implantable Lead Model: 5076
Lead Channel Impedance Value: 361 Ohm
Lead Channel Impedance Value: 361 Ohm
Lead Channel Impedance Value: 399 Ohm
Lead Channel Impedance Value: 456 Ohm
Lead Channel Pacing Threshold Amplitude: 0.625 V
Lead Channel Pacing Threshold Amplitude: 0.75 V
Lead Channel Pacing Threshold Pulse Width: 0.4 ms
Lead Channel Pacing Threshold Pulse Width: 0.4 ms
Lead Channel Sensing Intrinsic Amplitude: 3.25 mV
Lead Channel Sensing Intrinsic Amplitude: 3.25 mV
Lead Channel Sensing Intrinsic Amplitude: 9.875 mV
Lead Channel Sensing Intrinsic Amplitude: 9.875 mV
Lead Channel Setting Pacing Amplitude: 2 V
Lead Channel Setting Pacing Amplitude: 2.5 V
Lead Channel Setting Pacing Pulse Width: 0.4 ms
Lead Channel Setting Sensing Sensitivity: 2 mV
MDC IDC MSMT BATTERY REMAINING LONGEVITY: 86 mo
MDC IDC PG IMPLANT DT: 20170420
MDC IDC SESS DTM: 20200303161447

## 2019-03-02 ENCOUNTER — Other Ambulatory Visit: Payer: Self-pay | Admitting: Family Medicine

## 2019-03-07 ENCOUNTER — Encounter: Payer: Self-pay | Admitting: Cardiology

## 2019-03-07 ENCOUNTER — Other Ambulatory Visit: Payer: Self-pay | Admitting: Hematology & Oncology

## 2019-03-07 DIAGNOSIS — C9210 Chronic myeloid leukemia, BCR/ABL-positive, not having achieved remission: Secondary | ICD-10-CM

## 2019-03-07 NOTE — Progress Notes (Signed)
Remote pacemaker transmission.   

## 2019-03-23 ENCOUNTER — Ambulatory Visit: Payer: Medicare HMO | Admitting: Family Medicine

## 2019-03-26 ENCOUNTER — Other Ambulatory Visit: Payer: Self-pay

## 2019-03-26 ENCOUNTER — Other Ambulatory Visit: Payer: Self-pay | Admitting: Family Medicine

## 2019-03-26 ENCOUNTER — Ambulatory Visit (INDEPENDENT_AMBULATORY_CARE_PROVIDER_SITE_OTHER): Payer: Medicare HMO | Admitting: Family Medicine

## 2019-03-26 DIAGNOSIS — I480 Paroxysmal atrial fibrillation: Secondary | ICD-10-CM | POA: Diagnosis not present

## 2019-03-26 DIAGNOSIS — K219 Gastro-esophageal reflux disease without esophagitis: Secondary | ICD-10-CM | POA: Diagnosis not present

## 2019-03-26 DIAGNOSIS — R519 Headache, unspecified: Secondary | ICD-10-CM

## 2019-03-26 DIAGNOSIS — I10 Essential (primary) hypertension: Secondary | ICD-10-CM | POA: Diagnosis not present

## 2019-03-26 DIAGNOSIS — D649 Anemia, unspecified: Secondary | ICD-10-CM | POA: Diagnosis not present

## 2019-03-26 DIAGNOSIS — I495 Sick sinus syndrome: Secondary | ICD-10-CM

## 2019-03-26 DIAGNOSIS — R51 Headache: Secondary | ICD-10-CM

## 2019-03-26 NOTE — Assessment & Plan Note (Signed)
Has been taking his blood pressure at home this am he got 130/74 with pulse 67, recheck 114/67 with pulse of 60 and 123/73 with pulse of 66. Yesterday BP was 109/72. Well controlled, no changes to meds. Encouraged heart healthy diet such as the DASH diet and exercise as tolerated.

## 2019-03-26 NOTE — Assessment & Plan Note (Signed)
Patient reports pulses stay in the 39s

## 2019-03-26 NOTE — Progress Notes (Signed)
Virtual Visit via Telephone Note  I connected with Jason [name] on 03/26/19 at  9:45 AM EDT by telephone and verified that I am speaking with the correct person using two identifiers. Patient was gotten on the line by Magdalene Molly, CMA   I discussed the limitations, risks, security and privacy concerns of performing an evaluation and management service by telephone and the availability of in person appointments. I also discussed with the patient that there may be a patient responsible charge related to this service. The patient expressed understanding and agreed to proceed.    History of Present Illness: patient is spoken to via the phone. He is self quartining but was at the grocery store about 2 days ago. So he began with some mild nausea and anorexia as well as nausea for about 3 days. No other acute symptoms. His blood pressure and pulse have been within normal limits. He has been checking more due to some mild headaches over the past 3 days. Notes poor water intake. Denies CP/palp/SOB/HA/congestion/fevers/GI or GU c/o. Taking meds as prescribed    Observations/Objective: vital signs reviewed. Patient alert and oriented.    Assessment and Plan:  Hypertension Has been taking his blood pressure at home this am he got 130/74 with pulse 67, recheck 114/67 with pulse of 60 and 123/73 with pulse of 66. Yesterday BP was 109/72. Well controlled, no changes to meds. Encouraged heart healthy diet such as the DASH diet and exercise as tolerated.   Anemia Slight. Increase leafy greens, consider increased lean red meat and using cast iron cookware. Continue to monitor, report any concerns.   Headache Notes generalized HA for a couple days. Only one trip to grocery store 2 days ago. And the symptoms started about 3 days ago. Encouraged increased hydration, 64 ounces of clear fluids daily. Minimize alcohol and caffeine. Eat small frequent meals with lean proteins and complex carbs. Avoid high and low  blood sugars. Get adequate sleep, 7-8 hours a night. Needs exercise daily preferably in the morning.   Tachy-brady syndrome ([place]) Patient reports pulses stay in the 60s  GERD (gastroesophageal reflux disease) Notes some mild nausea over about 3 days. No vomiting or diarrhea. Mild anorexia noted. Encouraged to eat more carbohydrates and lean proteins, small amounts frequently  PAF (paroxysmal atrial fibrillation) ([facility]) Asymptomatic, tolerating Xarelto     Follow Up Instructions:    I discussed the assessment and treatment plan with the patient. The patient was provided an opportunity to ask questions and all were answered. The patient agreed with the plan and demonstrated an understanding of the instructions.   The patient was advised to call back or seek an in-person evaluation if the symptoms worsen or if the condition fails to improve as anticipated.  I provided 18 minutes of non-face-to-face time during this encounter.   Penni Homans, MD

## 2019-03-26 NOTE — Assessment & Plan Note (Signed)
Asymptomatic, tolerating Xarelto

## 2019-03-26 NOTE — Assessment & Plan Note (Signed)
Slight. Increase leafy greens, consider increased lean red meat and using cast iron cookware. Continue to monitor, report any concerns.

## 2019-03-26 NOTE — Assessment & Plan Note (Signed)
Notes generalized HA for a couple days. Only one trip to grocery store 2 days ago. And the symptoms started about 3 days ago. Encouraged increased hydration, 64 ounces of clear fluids daily. Minimize alcohol and caffeine. Eat small frequent meals with lean proteins and complex carbs. Avoid high and low blood sugars. Get adequate sleep, 7-8 hours a night. Needs exercise daily preferably in the morning.

## 2019-03-26 NOTE — Assessment & Plan Note (Signed)
Notes some mild nausea over about 3 days. No vomiting or diarrhea. Mild anorexia noted. Encouraged to eat more carbohydrates and lean proteins, small amounts frequently

## 2019-03-28 ENCOUNTER — Other Ambulatory Visit: Payer: Self-pay | Admitting: Family Medicine

## 2019-04-06 ENCOUNTER — Other Ambulatory Visit: Payer: Self-pay | Admitting: Cardiology

## 2019-04-09 NOTE — Telephone Encounter (Signed)
Xarelto 20mg  refill request received; pt is 73 yrs old, wt-92kg, Crea-1.19 on 12/19/18, last seen by Dr.Crenshaw on 01/10/2019, CrCl-6ml/min; refill sent.

## 2019-04-21 ENCOUNTER — Other Ambulatory Visit: Payer: Self-pay | Admitting: Family Medicine

## 2019-04-23 ENCOUNTER — Other Ambulatory Visit: Payer: Self-pay | Admitting: Family Medicine

## 2019-05-02 ENCOUNTER — Other Ambulatory Visit: Payer: Self-pay | Admitting: Cardiology

## 2019-05-11 ENCOUNTER — Other Ambulatory Visit: Payer: Self-pay | Admitting: Family Medicine

## 2019-05-18 ENCOUNTER — Other Ambulatory Visit: Payer: Self-pay | Admitting: Family Medicine

## 2019-05-23 ENCOUNTER — Other Ambulatory Visit: Payer: Self-pay | Admitting: Family Medicine

## 2019-05-25 ENCOUNTER — Emergency Department (HOSPITAL_BASED_OUTPATIENT_CLINIC_OR_DEPARTMENT_OTHER): Payer: Medicare HMO

## 2019-05-25 ENCOUNTER — Emergency Department (HOSPITAL_BASED_OUTPATIENT_CLINIC_OR_DEPARTMENT_OTHER)
Admission: EM | Admit: 2019-05-25 | Discharge: 2019-05-25 | Disposition: A | Discharge status: Home / Self Care | Payer: Medicare HMO | Attending: Emergency Medicine | Admitting: Emergency Medicine

## 2019-05-25 ENCOUNTER — Encounter (HOSPITAL_BASED_OUTPATIENT_CLINIC_OR_DEPARTMENT_OTHER): Payer: Self-pay | Admitting: *Deleted

## 2019-05-25 ENCOUNTER — Other Ambulatory Visit: Payer: Self-pay

## 2019-05-25 ENCOUNTER — Ambulatory Visit: Payer: Self-pay | Admitting: Family Medicine

## 2019-05-25 ENCOUNTER — Telehealth: Payer: Self-pay | Admitting: Cardiology

## 2019-05-25 DIAGNOSIS — Z79899 Other long term (current) drug therapy: Secondary | ICD-10-CM | POA: Diagnosis not present

## 2019-05-25 DIAGNOSIS — R109 Unspecified abdominal pain: Secondary | ICD-10-CM | POA: Diagnosis not present

## 2019-05-25 DIAGNOSIS — R072 Precordial pain: Secondary | ICD-10-CM | POA: Diagnosis not present

## 2019-05-25 DIAGNOSIS — R079 Chest pain, unspecified: Secondary | ICD-10-CM | POA: Diagnosis not present

## 2019-05-25 DIAGNOSIS — Z8673 Personal history of transient ischemic attack (TIA), and cerebral infarction without residual deficits: Secondary | ICD-10-CM | POA: Diagnosis not present

## 2019-05-25 DIAGNOSIS — I2510 Atherosclerotic heart disease of native coronary artery without angina pectoris: Secondary | ICD-10-CM | POA: Diagnosis not present

## 2019-05-25 DIAGNOSIS — I10 Essential (primary) hypertension: Secondary | ICD-10-CM | POA: Diagnosis not present

## 2019-05-25 DIAGNOSIS — Z7901 Long term (current) use of anticoagulants: Secondary | ICD-10-CM | POA: Diagnosis not present

## 2019-05-25 DIAGNOSIS — Z87891 Personal history of nicotine dependence: Secondary | ICD-10-CM | POA: Insufficient documentation

## 2019-05-25 DIAGNOSIS — I714 Abdominal aortic aneurysm, without rupture: Secondary | ICD-10-CM | POA: Diagnosis not present

## 2019-05-25 DIAGNOSIS — R0602 Shortness of breath: Secondary | ICD-10-CM | POA: Diagnosis not present

## 2019-05-25 LAB — LIPASE, BLOOD: Lipase: 133 U/L — ABNORMAL HIGH (ref 11–51)

## 2019-05-25 LAB — HEPATIC FUNCTION PANEL
ALT: 23 U/L (ref 0–44)
AST: 29 U/L (ref 15–41)
Albumin: 3.9 g/dL (ref 3.5–5.0)
Alkaline Phosphatase: 74 U/L (ref 38–126)
Bilirubin, Direct: 0.1 mg/dL (ref 0.0–0.2)
Indirect Bilirubin: 0.6 mg/dL (ref 0.3–0.9)
Total Bilirubin: 0.7 mg/dL (ref 0.3–1.2)
Total Protein: 7.2 g/dL (ref 6.5–8.1)

## 2019-05-25 LAB — CBC
HCT: 39.1 % (ref 39.0–52.0)
Hemoglobin: 12.4 g/dL — ABNORMAL LOW (ref 13.0–17.0)
MCH: 30.8 pg (ref 26.0–34.0)
MCHC: 31.7 g/dL (ref 30.0–36.0)
MCV: 97.3 fL (ref 80.0–100.0)
Platelets: 246 10*3/uL (ref 150–400)
RBC: 4.02 MIL/uL — ABNORMAL LOW (ref 4.22–5.81)
RDW: 14.6 % (ref 11.5–15.5)
WBC: 8.1 10*3/uL (ref 4.0–10.5)
nRBC: 0 % (ref 0.0–0.2)

## 2019-05-25 LAB — BASIC METABOLIC PANEL
Anion gap: 10 (ref 5–15)
BUN: 9 mg/dL (ref 8–23)
CO2: 23 mmol/L (ref 22–32)
Calcium: 8.7 mg/dL — ABNORMAL LOW (ref 8.9–10.3)
Chloride: 108 mmol/L (ref 98–111)
Creatinine, Ser: 1.23 mg/dL (ref 0.61–1.24)
GFR calc Af Amer: >60 mL/min (ref >60)
GFR calc non Af Amer: 59 mL/min — ABNORMAL LOW (ref >60)
Glucose, Bld: 100 mg/dL — ABNORMAL HIGH (ref 70–99)
Potassium: 3.2 mmol/L — ABNORMAL LOW (ref 3.5–5.1)
Sodium: 141 mmol/L (ref 135–145)

## 2019-05-25 LAB — TROPONIN I
Troponin I: <0.03 ng/mL (ref <0.03)
Troponin I: <0.03 ng/mL (ref <0.03)

## 2019-05-25 MED ORDER — ASPIRIN 81 MG PO CHEW
324.0000 mg | CHEWABLE_TABLET | Freq: Once | ORAL | Status: DC
  Filled 2019-05-25: qty 4

## 2019-05-25 MED ORDER — SODIUM CHLORIDE 0.9% FLUSH
3.0000 mL | Freq: Once | INTRAVENOUS | Status: DC
  Filled 2019-05-25: qty 3

## 2019-05-25 MED ORDER — ALUM & MAG HYDROXIDE-SIMETH 200-200-20 MG/5ML PO SUSP
30.0000 mL | Freq: Once | ORAL | Status: AC
  Administered 2019-05-25: 17:00:00 30 mL via ORAL
  Filled 2019-05-25: qty 30

## 2019-05-25 MED ORDER — ONDANSETRON 4 MG PO TBDP: 4.0000 mg | ORAL_TABLET | Freq: Once | ORAL | Status: DC

## 2019-05-25 MED ORDER — IOHEXOL 300 MG/ML  SOLN
100.0000 mL | Freq: Once | INTRAMUSCULAR | Status: AC | PRN
  Administered 2019-05-25: 17:00:00 100 mL via INTRAVENOUS

## 2019-05-25 MED ORDER — ONDANSETRON HCL 4 MG/2ML IJ SOLN
4.0000 mg | Freq: Once | INTRAMUSCULAR | Status: AC
  Administered 2019-05-25: 4 mg via INTRAVENOUS
  Filled 2019-05-25: qty 2

## 2019-05-25 NOTE — ED Triage Notes (Signed)
Pressure in the center of his chest x 4 days. Pale. Sob today. Hx of MI in 2000. Coronary stents.

## 2019-05-25 NOTE — Telephone Encounter (Addendum)
Transmission received. Current EGM  AP/ VS at 85 bpm. 32 treated AT/AF(78.1 % success per device) and 8 monitored AT/AF.AT/AF burden < 1 %. Most recent episode 05/22/19.Histograms and lead trends stable.  Spoke with pt who reported nausea, headache, cough and sinus pressure. Afebrile. Denies CP or SHOB at this time. Reported his SBP has been 112-120 and his HR 68-86 over the past 3 days. C/o pressure in "stomach'" that has caused nausea x 3 days. Pt directed to call PCP for evaluation of sx.

## 2019-05-25 NOTE — Telephone Encounter (Signed)
Patient called and stated that he has not been feeling well. He is nauseated, and a little short of breath, and wore out. Instructed pt to send a transmission w/ his home monitor. Pt verbalized understanding and said he would send the transmission.

## 2019-05-25 NOTE — ED Provider Notes (Signed)
[facility] EMERGENCY DEPARTMENT Provider Note   CSN: [id number] Arrival date & time: 05/25/19  1451    History   Chief Complaint Chief Complaint  Patient presents with   Chest Pain   Abdominal Pain    HPI Jason [name] is a 73 y.o. male.     The history is provided by the patient.  Chest Pain  Pain location:  Substernal area Pain quality: aching   Pain radiates to:  Does not radiate Onset quality:  Gradual Timing:  Intermittent Progression:  Waxing and waning Chronicity:  New Relieved by:  Rest Worsened by:  Exertion Associated symptoms: abdominal pain, heartburn (maybe) and nausea   Associated symptoms: no back pain, no cough, no fever, no palpitations, no shortness of breath and no vomiting   Risk factors: coronary artery disease, high cholesterol and hypertension     Past Medical History:  Diagnosis Date   Anemia 05/08/2015   CML (chronic myelocytic leukemia) ([place]) 12/16/2011   Coronary heart disease    CVA (cerebrovascular accident) ([place])    GERD (gastroesophageal reflux disease)    Grief reaction 03/18/2014   H/O tobacco use, presenting hazards to health 02/24/2012   No cigarettes since pacemaker placement. Encouraged ongoing efforts    History of chicken pox    History of kidney stones    Hyperlipidemia    Hypertension    IBS (irritable bowel syndrome) 09/20/2013   Medicare annual wellness visit, subsequent 03/23/2014   PAF (paroxysmal atrial fibrillation) (HCC)    Symptomatic bradycardia    a. symptomatic bradycardia due to sinus node dysfunction s/p RA and RV Medtronic pacemaker 04/15/16    Patient Active Problem List   Diagnosis Date Noted   Headache 03/26/2019   Symptomatic bradycardia    PAF (paroxysmal atrial fibrillation) ([place])    History of kidney stones    Hypertension    History of chicken pox    GERD (gastroesophageal reflux disease)    CVA (cerebrovascular accident) ([place])    Sun-damaged skin  09/18/2017   Otitis externa 09/18/2017   Tachy-brady syndrome ([place]) 04/16/2016   Anemia 05/08/2015   Abdominal aortic aneurysm ([place]) 03/19/2015   Dizziness 10/13/2014   Bruit 08/07/2014   Chest pain with moderate risk of acute coronary syndrome 07/15/2014   Chronic anticoagulation 07/15/2014   History of CVA (cerebrovascular accident) 07/15/2014   Preventative health care 03/23/2014   Medicare annual wellness visit, subsequent 03/23/2014   Grief reaction 03/18/2014   IBS (irritable bowel syndrome) 09/20/2013   Hypokalemia 09/09/2012   Corneal edema 09/08/2012   Pseudophakia 09/08/2012   Coronary artery disease 08/31/2012   Leukemia ([place]) 08/31/2012   Nausea 07/06/2012   Mature cataract 06/23/2012   Hyperlipidemia, mixed 02/24/2012   CAD- prior stents 2000 by Dr Elonda Husky in [facility] 02/24/2012   H/O tobacco use, presenting hazards to health 02/24/2012   CML (chronic myelocytic leukemia) ([place]) 12/16/2011    Past Surgical History:  Procedure Laterality Date   CATARACT EXTRACTION     CHOLECYSTECTOMY  2003   CORONARY ANGIOPLASTY WITH STENT PLACEMENT  2000   EP IMPLANTABLE DEVICE N/A 04/15/2016   Procedure: Pacemaker Implant;  Surgeon: Evans Lance, MD; Medtronic (serial number [id number]) pacemaker; Laterality: Left        Home Medications    Prior to Admission medications   Medication Sig Start Date End Date Taking? Authorizing Provider  acetaminophen (TYLENOL) 325 MG tablet Take 650 mg by mouth every 6 (six) hours as needed. For headache.  [provider]  amiodarone (PACERONE) 200 MG tablet TAKE 1 TABLET EVERY DAY 02/14/19   Camnitz, Ocie Doyne, MD  amLODipine (NORVASC) 5 MG tablet Take 1 tablet (5 mg total) by mouth daily. Take one tablet every day (Keep OV) 09/05/18   Lelon Perla, MD  atorvastatin (LIPITOR) 40 MG tablet TAKE 1 TABLET EVERY DAY   02/14/19   Lelon Perla, MD  dicyclomine (BENTYL) 20 MG tablet Take 1 tablet (20 mg  total) by mouth 2 (two) times daily. 08/01/17   Long, Wonda Olds, MD  ferrous sulfate 325 (65 FE) MG EC tablet Take 1 tablet (325 mg total) by mouth 2 (two) times daily. 09/02/15   Irene Shipper, MD  fluticasone [facility]) 50 MCG/ACT nasal spray USE 2 SPRAYS IN [facility] NOSTRIL EVERY DAY 05/11/18   Mosie Lukes, MD  imatinib ([name]) 100 MG tablet TAKE 2 TABLETS BY MOUTH ONCE DAILY WITH MEALS AND LARGE GLASS OF WATER 03/07/19   Volanda Napoleon, MD  losartan (COZAAR) 100 MG tablet TAKE 1 TABLET EVERY DAY 05/24/19   Mosie Lukes, MD  meclizine (ANTIVERT) 25 MG tablet TAKE 1 TABLET THREE TIMES DAILY 05/14/19   Mosie Lukes, MD  metoprolol tartrate (LOPRESSOR) 100 MG tablet TAKE 1 TABLET TWICE DAILY 05/03/19   Lelon Perla, MD  metoprolol tartrate (LOPRESSOR) 50 MG tablet Take 50 mg by mouth 2 (two) times daily. Take 4 tablets 200 mg twice a day.    [provider]  nitroGLYCERIN (NITROSTAT) 0.4 MG SL tablet Place 1 tablet (0.4 mg total) under the tongue every 5 (five) minutes as needed for chest pain. 09/22/18   Mosie Lukes, MD  ofloxacin (FLOXIN OTIC) 0.3 % OTIC solution Place 10 drops into both ears daily. For 7 days 09/15/17   Carollee Herter, Kendrick Fries R, DO  omeprazole (PRILOSEC) 40 MG capsule TAKE 1 CAPSULE EVERY DAY 02/15/19   Mosie Lukes, MD  ondansetron (ZOFRAN) 4 MG tablet TAKE 1 TABLET EVERY 8 HOURS AS NEEDED FOR NAUSEA AND VOMITING 05/22/19   Mosie Lukes, MD  Probiotic Product (PROBIOTIC FORMULA PO) Take 1 capsule by mouth daily.     [provider]  ranitidine (ZANTAC) 300 MG tablet TAKE 1 TABLET AT BEDTIME 01/15/19   Mosie Lukes, MD  XARELTO 20 MG TABS tablet TAKE 1 TABLET EVERY DAY WITH SUPPER 04/09/19   Stanford Breed Denice Bors, MD    Family History Family History  Problem Relation Age of Onset   Arthritis Mother    Hypertension Mother    Heart disease Mother        Rheumatic fever   Rheumatic fever Mother    Other Mother        brain tumor   Cancer Mother          leukemia   Hypertension Father    Alcohol abuse Father    Diabetes Son    Colon cancer Neg Hx    Breast cancer Neg Hx    Prostate cancer Neg Hx     Social History Social History   Tobacco Use   Smoking status: Former Smoker    Packs/day: 0.25    Years: 40.00    Pack years: 10.00    Types: Cigarettes    Start date: 10/30/1967    Last attempt to quit: 04/14/2016    Years since quitting: 3.1   Smokeless tobacco: Never Used   Tobacco comment: 04-16 quit smoking  Substance Use Topics  Alcohol use: No    Alcohol/week: 0.0 standard drinks    Comment: Rare   Drug use: No     Allergies   Patient has no known allergies.   Review of Systems Review of Systems  Constitutional: Negative for chills and fever.  HENT: Negative for ear pain and sore throat.   Eyes: Negative for pain and visual disturbance.  Respiratory: Negative for cough and shortness of breath.   Cardiovascular: Positive for chest pain. Negative for palpitations.  Gastrointestinal: Positive for abdominal pain, heartburn (maybe) and nausea. Negative for vomiting.  Genitourinary: Negative for dysuria and hematuria.  Musculoskeletal: Negative for arthralgias and back pain.  Skin: Negative for color change and rash.  Neurological: Negative for seizures and syncope.  All other systems reviewed and are negative.    Physical Exam Updated Vital Signs  ED Triage Vitals  Enc Vitals Group     BP 05/25/19 1458 (!) 172/73     Pulse Rate 05/25/19 1458 80     Resp 05/25/19 1458 20     Temp 05/25/19 1458 98.1 F (36.7 C)     Temp Source 05/25/19 1458 Oral     SpO2 05/25/19 1458 100 %     Weight 05/25/19 1456 200 lb (90.7 kg)     Height 05/25/19 1456 5\' 8"  (1.727 m)     Head Circumference --      Peak Flow --      Pain Score 05/25/19 1456 3     Pain Loc --      Pain Edu? --      Excl. in [place]? --     Physical Exam Vitals signs and nursing note reviewed.  Constitutional:      Appearance: He is  well-developed.  HENT:     Head: Normocephalic and atraumatic.  Eyes:     Conjunctiva/sclera: Conjunctivae normal.  Neck:     Musculoskeletal: Neck supple.  Cardiovascular:     Rate and Rhythm: Normal rate and regular rhythm.     Pulses:          Radial pulses are 2+ on the right side and 2+ on the left side.     Heart sounds: Normal heart sounds. No murmur.  Pulmonary:     Effort: Pulmonary effort is normal. No respiratory distress.     Breath sounds: Normal breath sounds. No decreased breath sounds, wheezing or rhonchi.  Abdominal:     Palpations: Abdomen is soft.     Tenderness: There is no abdominal tenderness.  Musculoskeletal:     Right lower leg: No edema.     Left lower leg: No edema.  Skin:    General: Skin is warm and dry.     Capillary Refill: Capillary refill takes less than 2 seconds.  Neurological:     General: No focal deficit present.     Mental Status: He is alert.      ED Treatments / Results  Labs (all labs ordered are listed, but only abnormal results are displayed) Labs Reviewed  BASIC METABOLIC PANEL - Abnormal; Notable for the following components:      Result Value   Potassium 3.2 (*)    Glucose, Bld 100 (*)    Calcium 8.7 (*)    GFR calc non Af Amer 59 (*)    All other components within normal limits  CBC - Abnormal; Notable for the following components:   RBC 4.02 (*)    Hemoglobin 12.4 (*)    All other  components within normal limits  LIPASE, BLOOD - Abnormal; Notable for the following components:   Lipase 133 (*)    All other components within normal limits  TROPONIN I  HEPATIC FUNCTION PANEL  TROPONIN I    EKG  EKG shows atrial fibrillation at 63 bpm.  No signs of ischemic changes.  EKG did not pull over into MUSE.  Radiology Dg Chest 2 View  Result Date: 05/25/2019 CLINICAL DATA:  Chest pain and shortness of breath EXAM: CHEST - 2 VIEW COMPARISON:  August 01, 2017 FINDINGS: There is an apparent nipple shadow on the left. There  is interstitial thickening bilaterally. There is no frank edema or consolidation. Heart size and pulmonary vascular normal. Pacemaker leads are attached to the right atrium and right ventricle. No pneumothorax. No adenopathy. No bone lesions. IMPRESSION: Stable interstitial thickening. No edema or consolidation. Stable cardiac silhouette. Pacemaker leads are attached to the right atrium and right ventricle. Electronically Signed   By: Lowella Grip III M.D.   On: 05/25/2019 15:34   Ct Abdomen Pelvis W Contrast  Result Date: 05/25/2019 CLINICAL DATA:  Acute generalized abdominal pain. EXAM: CT ABDOMEN AND PELVIS WITH CONTRAST TECHNIQUE: Multidetector CT imaging of the abdomen and pelvis was performed using the standard protocol following bolus administration of intravenous contrast. CONTRAST:  146mL OMNIPAQUE IOHEXOL 300 MG/ML  SOLN COMPARISON:  12/18/2018 CT angiogram of the abdomen and pelvis. FINDINGS: Lower chest: No acute abnormality at the lung bases. Nonspecific patchy confluent subpleural reticulation and ground-glass attenuation at both lung bases, not appreciably changed. Pacemaker lead tips are seen in the right atrium and right ventricular apex. Coronary atherosclerosis. Hepatobiliary: Normal liver size. No liver mass. Cholecystectomy. No biliary ductal dilatation. Pancreas: Normal, with no mass or duct dilation. Spleen: Normal size. No mass. Adrenals/Urinary Tract: Stable appearance of the adrenal glands, with no discrete adrenal nodules. No hydronephrosis. Subcentimeter hypodense renal cortical lesions scattered in both kidneys are too small to characterize and appear unchanged, considered benign. No new renal lesions. Normal bladder. Stomach/Bowel: Normal non-distended stomach. Normal caliber small bowel with no small bowel wall thickening. Normal appendix. Normal large bowel with no diverticulosis, large bowel wall thickening or pericolonic fat stranding. Vascular/Lymphatic: Atherosclerotic  abdominal aorta with stable 4.7 cm infrarenal abdominal aortic aneurysm using similar measurement technique. No evidence of periaortic fluid or fat stranding. No evidence of abdominal aortic dissection. Patent portal, splenic, hepatic and renal veins. No pathologically enlarged lymph nodes in the abdomen or pelvis. Reproductive: Top-normal size prostate. Other: No pneumoperitoneum, ascites or focal fluid collection. Musculoskeletal: No aggressive appearing focal osseous lesions. Moderate thoracolumbar spondylosis. IMPRESSION: 1. Stable 4.7 cm infrarenal Abdominal Aortic Aneurysm (ICD10-I71.9). Recommend follow-up CTA of the abdomen and pelvis in 6 months and vascular surgery referral/consultation. This recommendation follows ACR consensus guidelines: White Paper of the ACR Incidental Findings Committee II on Vascular Findings. J Am Coll Radiol 2013; 10:789-794. 2. No acute abnormality. No evidence of bowel obstruction or acute bowel inflammation. 3. Stable nonspecific fibrosis at the lung bases. 4.  Aortic Atherosclerosis (ICD10-I70.0). Electronically Signed   By: Ilona Sorrel M.D.   On: 05/25/2019 17:12    Procedures Procedures (including critical care time)  Medications Ordered in ED Medications  sodium chloride flush (NS) 0.9 % injection 3 mL (3 mLs Intravenous Not Given 05/25/19 1902)  aspirin chewable tablet 324 mg (324 mg Oral Not Given 05/25/19 1632)  alum & mag hydroxide-simeth (MAALOX/MYLANTA) 200-200-20 MG/5ML suspension 30 mL (30 mLs Oral Given 05/25/19 1632)  ondansetron (  ZOFRAN) injection 4 mg (4 mg Intravenous Given 05/25/19 1634)  iohexol (OMNIPAQUE) 300 MG/ML solution 100 mL (100 mLs Intravenous Contrast Given 05/25/19 1652)     Initial Impression / Assessment and Plan / ED Course  I have reviewed the triage vital signs and the nursing notes.  Pertinent labs & imaging results that were available during my care of the patient were reviewed by me and considered in my medical decision  making (see chart for details).     Jason [name] is a 73 year old male with history of hypertension, high cholesterol, CAD status post stent, A. fib on blood thinner who presents to the ED with chest pain.  Patient with hypertension but otherwise unremarkable vitals. Patient with intermittent chest pain/epigasric abdominal pain over the last several days that is worse with eating/maybe exertion.  No pain currently.  States it feels like possibly heartburn as well.  Has not had significant cardiac work-up in a while.  Does not sound like dissection.  No concern for PE.  Patient is also on a blood thinner. EKG shows rate controlled atrial fibrillation.  No signs of ischemic changes.  Will get lab work including troponins.  Patient hesitant about possible admission for further chest pain work-up given that he has elevated heart score.  Troponin within normal limits.  Patient with no significant anemia, electrolyte abnormality, kidney injury.  Lipase is mildly elevated to 133.  Patient denies any alcohol use.  Has had his gallbladder removed in the past.  Gallbladder and liver enzymes are normal.  Suspect that he may have a mild case of pancreatitis that is likely causing his pain.  After further discussion it appears the pain is mostly worse after eating and associated with some nausea.  However patient is not complaining of any pain now.  He is comfortable.  No abdominal tenderness on exam.  Will get second troponin and will obtain a CT abdomen and pelvis to further investigate.  Patient does not want to be admitted for further heart work-up at this time as well.  However, after shared decision believe that this is likely okay given that I think his symptoms are mostly from a GI standpoint rather than ACS. Patient will be given IV Zofran and will reevaluate after CT scan and second troponin.  Suspect symptoms are likely secondary to mild pancreatitis.  Second troponin is normal.  CT scan was overall  unremarkable.  He has known AAA.  Patient did not have any episodes of pain throughout my care.  He did not want to be admitted for further ACS rule out per my recommendations, given his risk factors.  Did not have any signs of pancreatitis on CT scan.  He wants to follow-up with cardiology outpatient and understands return precautions.  Shared decision was made not to admit the patient and he was discharged from the ED in good condition.  This chart was dictated using voice recognition software.  Despite best efforts to proofread,  errors can occur which can change the documentation meaning.    Final Clinical Impressions(s) / ED Diagnoses   Final diagnoses:  Chest pain, unspecified type    ED Discharge Orders    None       Lennice Sites, DO 05/25/19 1942

## 2019-05-25 NOTE — Telephone Encounter (Signed)
Pt called in c/o having chest pressure and dizziness/fullness in my head like when my BP is high.   "Not sure it's a  Dizziness but a fullness in my head.    Had the chest pressure for 2 days.    Has strong cardiac history.   See triage notes.  I have referred him to the ED.   I instructed him to call EMS but he is going to call his daughter to come take him.   He is going to the [employer] on [address].    "I only live 3-4 miles from there".  I sent my notes to Dr. Frederik Pear office so she would be aware.    Reason for Disposition . [1] Chest pain lasts > 5 minutes AND [2] described as crushing, pressure-like, or heavy    A. Fib. Pacemaker and MI in 2000 with 2 stents put in.  Answer Assessment - Initial Assessment Questions 1. LOCATION: "Where does it hurt?"       I feel a pressure in my chest and also dizzy.   I'm having headaches.  I have  A pacemaker.    I  Sent a report this morning from it and they said it looked good.    112/72 BP   Pulse is 68-83.   The pressure has been there a couple of days.   I don't feel it right now.   I'm tired all the time.  Im a little short of breath with the pressure.  No appetite.  Nothing tastes good.   I had A. Fib. 2. RADIATION: "Does the pain go anywhere else?" (e.g., into neck, jaw, arms, back)     If I could throw up I would be alright. 3. ONSET: "When did the chest pain begin?" (Minutes, hours or days)      A couple of days now.   No pressure right now. 4. PATTERN "Does the pain come and go, or has it been constant since it started?"  "Does it get worse with exertion?"      It comes and goes. 5. DURATION: "How long does it last" (e.g., seconds, minutes, hours)     I don't know if this is acid reflux or my heart.   I feel dizzy for a couple of days. 6. SEVERITY: "How bad is the pain?"  (e.g., Scale 1-10; mild, moderate, or severe)    - MILD (1-3): doesn't interfere with normal activities     - MODERATE (4-7): interferes with normal activities  or awakens from sleep    - SEVERE (8-10): excruciating pain, unable to do any normal activities       I get short of breath with exertion. 7. CARDIAC RISK FACTORS: "Do you have any history of heart problems or risk factors for heart disease?" (e.g., prior heart attack, angina; high blood pressure, diabetes, being overweight, high cholesterol, smoking, or strong family history of heart disease)     Pacemaker,  A. Fib.  I had a heart attack in 2000.  I have 2 stents in my heart.  This pressure is different from the pain I had in 2000.   8. PULMONARY RISK FACTORS: "Do you have any history of lung disease?"  (e.g., blood clots in lung, asthma, emphysema, birth control pills)     No 9. CAUSE: "What do you think is causing the chest pain?"     I'm not sure.   10. OTHER SYMPTOMS: "Do you have any other symptoms?" (e.g.,  dizziness, nausea, vomiting, sweating, fever, difficulty breathing, cough)       Dizziness, shortness of breath.   I don't know if it's dizziness or a pressure in my head like if my BP is high.   11. PREGNANCY: "Is there any chance you are pregnant?" "When was your last menstrual period?"       N/A  Protocols used: CHEST PAIN-A-AH

## 2019-05-29 ENCOUNTER — Encounter: Payer: Medicare HMO | Admitting: *Deleted

## 2019-05-30 ENCOUNTER — Telehealth: Payer: Self-pay

## 2019-05-30 NOTE — Telephone Encounter (Signed)
Left message for patient to remind of missed remote transmission.  

## 2019-06-04 ENCOUNTER — Other Ambulatory Visit: Payer: Self-pay | Admitting: Family Medicine

## 2019-06-06 ENCOUNTER — Encounter: Payer: Self-pay | Admitting: Cardiology

## 2019-06-09 ENCOUNTER — Other Ambulatory Visit: Payer: Self-pay | Admitting: Cardiology

## 2019-06-12 NOTE — Telephone Encounter (Signed)
pls refill, tx 

## 2019-06-18 ENCOUNTER — Other Ambulatory Visit: Payer: Self-pay | Admitting: Family Medicine

## 2019-06-20 ENCOUNTER — Encounter: Payer: Self-pay | Admitting: Hematology & Oncology

## 2019-06-20 ENCOUNTER — Inpatient Hospital Stay: Payer: Medicare HMO | Attending: Hematology & Oncology | Admitting: Hematology & Oncology

## 2019-06-20 ENCOUNTER — Other Ambulatory Visit: Payer: Self-pay

## 2019-06-20 ENCOUNTER — Inpatient Hospital Stay: Payer: Medicare HMO

## 2019-06-20 VITALS — BP 130/65 | HR 95 | Temp 98.9°F | Resp 20 | Wt 199.4 lb

## 2019-06-20 DIAGNOSIS — Z950 Presence of cardiac pacemaker: Secondary | ICD-10-CM | POA: Diagnosis not present

## 2019-06-20 DIAGNOSIS — Z7901 Long term (current) use of anticoagulants: Secondary | ICD-10-CM | POA: Diagnosis not present

## 2019-06-20 DIAGNOSIS — C9210 Chronic myeloid leukemia, BCR/ABL-positive, not having achieved remission: Secondary | ICD-10-CM | POA: Diagnosis not present

## 2019-06-20 DIAGNOSIS — I480 Paroxysmal atrial fibrillation: Secondary | ICD-10-CM

## 2019-06-20 LAB — CBC WITH DIFFERENTIAL (CANCER CENTER ONLY)
Abs Immature Granulocytes: 0.04 10*3/uL (ref 0.00–0.07)
Basophils Absolute: 0.1 10*3/uL (ref 0.0–0.1)
Basophils Relative: 1 %
Eosinophils Absolute: 0.4 10*3/uL (ref 0.0–0.5)
Eosinophils Relative: 4 %
HCT: 37.7 % — ABNORMAL LOW (ref 39.0–52.0)
Hemoglobin: 12 g/dL — ABNORMAL LOW (ref 13.0–17.0)
Immature Granulocytes: 0 %
Lymphocytes Relative: 18 %
Lymphs Abs: 1.7 10*3/uL (ref 0.7–4.0)
MCH: 30.8 pg (ref 26.0–34.0)
MCHC: 31.8 g/dL (ref 30.0–36.0)
MCV: 96.7 fL (ref 80.0–100.0)
Monocytes Absolute: 0.9 10*3/uL (ref 0.1–1.0)
Monocytes Relative: 10 %
Neutro Abs: 6.3 10*3/uL (ref 1.7–7.7)
Neutrophils Relative %: 67 %
Platelet Count: 277 10*3/uL (ref 150–400)
RBC: 3.9 MIL/uL — ABNORMAL LOW (ref 4.22–5.81)
RDW: 14.6 % (ref 11.5–15.5)
WBC Count: 9.5 10*3/uL (ref 4.0–10.5)
nRBC: 0 % (ref 0.0–0.2)

## 2019-06-20 LAB — CMP (CANCER CENTER ONLY)
ALT: 13 U/L (ref 0–44)
AST: 18 U/L (ref 15–41)
Albumin: 4.1 g/dL (ref 3.5–5.0)
Alkaline Phosphatase: 82 U/L (ref 38–126)
Anion gap: 8 (ref 5–15)
BUN: 8 mg/dL (ref 8–23)
CO2: 27 mmol/L (ref 22–32)
Calcium: 9.3 mg/dL (ref 8.9–10.3)
Chloride: 108 mmol/L (ref 98–111)
Creatinine: 1.17 mg/dL (ref 0.61–1.24)
GFR, Est AFR Am: >60 mL/min (ref >60)
GFR, Estimated: >60 mL/min (ref >60)
Glucose, Bld: 98 mg/dL (ref 70–99)
Potassium: 3.6 mmol/L (ref 3.5–5.1)
Sodium: 143 mmol/L (ref 135–145)
Total Bilirubin: 0.5 mg/dL (ref 0.3–1.2)
Total Protein: 6.8 g/dL (ref 6.5–8.1)

## 2019-06-20 LAB — LACTATE DEHYDROGENASE: LDH: 216 U/L — ABNORMAL HIGH (ref 98–192)

## 2019-06-20 NOTE — Progress Notes (Signed)
10:22 AM Unsure of dose of Lopressor, instructed to check at home and call us back. Verbalized understanding.

## 2019-06-20 NOTE — Progress Notes (Signed)
Hematology and Oncology Follow Up Visit  Jason [name] [id number] 09/24/1946 73 y.o. 06/20/2019   Principle Diagnosis:  Chronic phase CML - MMR Paroxysmal atrial fibrillation - has pacemaker   Current Therapy:   [name] 200 mg by mouth daily -- d/c on 06/20/2019 Xarelto 20 mg by mouth daily   Interim History:  Mr. Jason [name] is here today for follow-up.  He is still in a major follicular remission.  As such, I will stop his [name].  He has been in a major molecular remission for over 5 years. I think that we can stop the [name] and see how he does.    His atrial fibrillation is doing okay.  He has a pacemaker in place.  He is on Xarelto.  He has no issues with respect to heart failure.  He is not smoking.  He has had no problems with rashes.  There is been no leg swelling.  He has had no nausea or vomiting.  Overall, his performance status is ECOG 1.  Medications:  Allergies as of 06/20/2019   No Known Allergies     Medication List       Accurate as of June 20, 2019 10:46 AM. If you have any questions, ask your nurse or doctor.        acetaminophen 325 MG tablet Commonly known as: TYLENOL Take 650 mg by mouth every 6 (six) hours as needed. For headache.   amiodarone 200 MG tablet Commonly known as: PACERONE TAKE 1 TABLET EVERY DAY   amLODipine 5 MG tablet Commonly known as: NORVASC TAKE 1 TABLET (5 MG TOTAL) BY MOUTH DAILY. KEEP OFFICE VISIT   atorvastatin 40 MG tablet Commonly known as: LIPITOR TAKE 1 TABLET EVERY DAY   dicyclomine 20 MG tablet Commonly known as: BENTYL Take 1 tablet (20 mg total) by mouth 2 (two) times daily.   ferrous sulfate 325 (65 FE) MG EC tablet Take 1 tablet (325 mg total) by mouth 2 (two) times daily.   fluticasone 50 MCG/ACT nasal spray Commonly known as: FLONASE USE 2 SPRAYS IN EACH NOSTRIL EVERY DAY   imatinib 100 MG tablet Commonly known as: [name] TAKE 2 TABLETS BY MOUTH ONCE DAILY WITH MEALS AND LARGE GLASS OF WATER    losartan 100 MG tablet Commonly known as: COZAAR TAKE 1 TABLET EVERY DAY   meclizine 25 MG tablet Commonly known as: ANTIVERT TAKE 1 TABLET THREE TIMES DAILY   metoprolol tartrate 50 MG tablet Commonly known as: LOPRESSOR Take 50 mg by mouth daily.   metoprolol tartrate 100 MG tablet Commonly known as: LOPRESSOR TAKE 1 TABLET TWICE DAILY   nitroGLYCERIN 0.4 MG SL tablet Commonly known as: NITROSTAT Place 1 tablet (0.4 mg total) under the tongue every 5 (five) minutes as needed for chest pain.   ofloxacin 0.3 % OTIC solution Commonly known as: Floxin Otic Place 10 drops into both ears daily. For 7 days   omeprazole 40 MG capsule Commonly known as: PRILOSEC TAKE 1 CAPSULE EVERY DAY   ondansetron 4 MG tablet Commonly known as: ZOFRAN TAKE 1 TABLET EVERY 8 HOURS AS NEEDED FOR NAUSEA AND VOMITING   PROBIOTIC FORMULA PO Take 1 capsule by mouth daily.   ranitidine 300 MG tablet Commonly known as: ZANTAC TAKE 1 TABLET AT BEDTIME   Xarelto 20 MG Tabs tablet Generic drug: rivaroxaban TAKE 1 TABLET EVERY DAY WITH SUPPER       Allergies: No Known Allergies  Past Medical History, Surgical history, Social history, and Family  History were reviewed and updated.  Review of Systems: Review of Systems  Constitutional: Negative.   HENT: Negative.   Eyes: Negative.   Respiratory: Negative.   Cardiovascular: Negative.   Gastrointestinal: Negative.   Genitourinary: Negative.   Musculoskeletal: Negative.   Skin: Negative.   Neurological: Negative.   Endo/Heme/Allergies: Negative.   Psychiatric/Behavioral: Negative.      Physical Exam:  weight is 199 lb 6.4 oz (90.4 kg). His oral temperature is 98.9 F (37.2 C). His blood pressure is 130/65 and his pulse is 95. His respiration is 20 and oxygen saturation is 97%.   Wt Readings from Last 3 Encounters:  06/20/19 199 lb 6.4 oz (90.4 kg)  05/25/19 200 lb (90.7 kg)  01/10/19 202 lb 12.8 oz (92 kg)    Physical Exam  Vitals signs reviewed.  HENT:     Head: Normocephalic and atraumatic.  Eyes:     Pupils: Pupils are equal, round, and reactive to light.  Neck:     Musculoskeletal: Normal range of motion.  Cardiovascular:     Rate and Rhythm: Normal rate and regular rhythm.     Heart sounds: Normal heart sounds.  Pulmonary:     Effort: Pulmonary effort is normal.     Breath sounds: Normal breath sounds.  Abdominal:     General: Bowel sounds are normal.     Palpations: Abdomen is soft.  Musculoskeletal: Normal range of motion.        General: No tenderness or deformity.  Lymphadenopathy:     Cervical: No cervical adenopathy.  Skin:    General: Skin is warm and dry.     Findings: No erythema or rash.  Neurological:     Mental Status: He is alert and oriented to person, place, and time.  Psychiatric:        Behavior: Behavior normal.        Thought Content: Thought content normal.        Judgment: Judgment normal.      Lab Results  Component Value Date   WBC 9.5 06/20/2019   HGB 12.0 (L) 06/20/2019   HCT 37.7 (L) 06/20/2019   MCV 96.7 06/20/2019   PLT 277 06/20/2019   Lab Results  Component Value Date   IRON 84 05/27/2015   TIBC 359 05/27/2015   UIBC 275 05/27/2015   IRONPCTSAT 23 05/27/2015   Lab Results  Component Value Date   RETICCTPCT 1.2 05/27/2015   RBC 3.90 (L) 06/20/2019   RETICCTABS 48.2 05/27/2015   No results found for: KPAFRELGTCHN, LAMBDASER, KAPLAMBRATIO No results found for: Kandis Cocking, IGMSERUM No results found for: Odetta Pink, SPEI   Chemistry      Component Value Date/Time   NA 143 06/20/2019 0927   NA 142 12/12/2018 1308   NA 143 11/26/2016 1030   K 3.6 06/20/2019 0927   K 3.8 11/26/2016 1030   CL 108 06/20/2019 0927   CL 109 (H) 03/31/2015 1119   CO2 27 06/20/2019 0927   CO2 25 11/26/2016 1030   BUN 8 06/20/2019 0927   BUN 8 12/12/2018 1308   BUN 5.1 (L) 11/26/2016 1030   CREATININE  1.17 06/20/2019 0927   CREATININE 1.0 11/26/2016 1030      Component Value Date/Time   CALCIUM 9.3 06/20/2019 0927   CALCIUM 9.3 11/26/2016 1030   ALKPHOS 82 06/20/2019 0927   ALKPHOS 106 11/26/2016 1030   AST 18 06/20/2019 0927   AST 16 11/26/2016 1030  ALT 13 06/20/2019 0927   ALT 10 11/26/2016 1030   BILITOT 0.5 06/20/2019 0927   BILITOT 0.53 11/26/2016 1030      Impression and Plan: Mr. Unangst is a very pleasant 13 this morningyo caucasian male with chronic phase CML.  He is in a major molecular remission.  Again, he has been in a major molecular remission for 5 years.  I will plan to see him back in 4 months.  I want to make sure that we have close follow-up for.  I have a good feeling that he will maintain a remission off [place].   Volanda Napoleon, MD 6/24/202010:46 AM

## 2019-06-25 NOTE — Progress Notes (Signed)
HPI: FU coronary artery disease and atrial fibrillation. Patient had stents placed at [facility] in 2000. Records are not available. Patient seen preoperatively at [facility] on September 01 2012 and electrocardiogram showed atrial fibrillation. TSH in September of 2013 was 1.612. Patient has been maintained on anticoagulation. Monitor April 2016 showed paroxysmal atrial fibrillation and Toprol increased.Echocardiogram repeated April 2017 and showed an ejection fraction of 40-45%, mild to moderate aortic insufficiency and mildly dilated aortic root. Nuclear study April 2017 showed ejection fraction 43%, inferior and lateral infarct but no ischemia. Has had pacemaker placed secondary to tachybradycardia syndrome.  Abdominal CT 5/20 showed 4.7 cm abdominal aortic aneurysm; FU 6 months recommended.  Since last seen,the patient has dyspnea with more extreme activities but not with routine activities. It is relieved with rest. It is not associated with chest pain. There is no orthopnea, PND or pedal edema. There is no syncope or palpitations. There is no exertional chest pain.   Current Outpatient Medications  Medication Sig Dispense Refill   acetaminophen (TYLENOL) 325 MG tablet Take 650 mg by mouth every 6 (six) hours as needed. For headache.     amiodarone (PACERONE) 200 MG tablet TAKE 1 TABLET EVERY DAY 90 tablet 3   amLODipine (NORVASC) 5 MG tablet TAKE 1 TABLET (5 MG TOTAL) BY MOUTH DAILY. KEEP OFFICE VISIT 90 tablet 3   atorvastatin (LIPITOR) 40 MG tablet TAKE 1 TABLET EVERY DAY   90 tablet 3   dicyclomine (BENTYL) 20 MG tablet Take 1 tablet (20 mg total) by mouth 2 (two) times daily. 20 tablet 0   ferrous sulfate 325 (65 FE) MG EC tablet Take 1 tablet (325 mg total) by mouth 2 (two) times daily. 60 tablet 11   fluticasone (FLONASE) 50 MCG/ACT nasal spray USE 2 SPRAYS IN EACH NOSTRIL EVERY DAY 48 g 3   imatinib ([name]) 100 MG tablet TAKE 2 TABLETS BY MOUTH ONCE DAILY  WITH MEALS AND LARGE GLASS OF WATER 60 tablet 4   losartan (COZAAR) 100 MG tablet TAKE 1 TABLET EVERY DAY 90 tablet 0   meclizine (ANTIVERT) 25 MG tablet TAKE 1 TABLET THREE TIMES DAILY 90 tablet 0   metoprolol tartrate (LOPRESSOR) 100 MG tablet TAKE 1 TABLET TWICE DAILY 180 tablet 0   metoprolol tartrate (LOPRESSOR) 50 MG tablet Take 50 mg by mouth daily.      nitroGLYCERIN (NITROSTAT) 0.4 MG SL tablet Place 1 tablet (0.4 mg total) under the tongue every 5 (five) minutes as needed for chest pain. 25 tablet 1   ofloxacin (FLOXIN OTIC) 0.3 % OTIC solution Place 10 drops into both ears daily. For 7 days 10 mL 0   omeprazole (PRILOSEC) 40 MG capsule TAKE 1 CAPSULE EVERY DAY 90 capsule 3   ondansetron (ZOFRAN) 4 MG tablet TAKE 1 TABLET EVERY 8 HOURS AS NEEDED FOR NAUSEA AND VOMITING 40 tablet 1   Probiotic Product (PROBIOTIC FORMULA PO) Take 1 capsule by mouth daily.      ranitidine (ZANTAC) 300 MG tablet TAKE 1 TABLET AT BEDTIME 90 tablet 3   XARELTO 20 MG TABS tablet TAKE 1 TABLET EVERY DAY WITH SUPPER 90 tablet 2   No current facility-administered medications for this visit.      Past Medical History:  Diagnosis Date   Anemia 05/08/2015   CML (chronic myelocytic leukemia) ([place]) 12/16/2011   Coronary heart disease    CVA (cerebrovascular accident) ([place])    GERD (gastroesophageal reflux disease)  Grief reaction 03/18/2014   H/O tobacco use, presenting hazards to health 02/24/2012   No cigarettes since pacemaker placement. Encouraged ongoing efforts    History of chicken pox    History of kidney stones    Hyperlipidemia    Hypertension    IBS (irritable bowel syndrome) 09/20/2013   Medicare annual wellness visit, subsequent 03/23/2014   PAF (paroxysmal atrial fibrillation) ([facility])    Symptomatic bradycardia    a. symptomatic bradycardia due to sinus node dysfunction s/p RA and RV Medtronic pacemaker 04/15/16    Past Surgical History:  Procedure Laterality Date     CATARACT EXTRACTION     CHOLECYSTECTOMY  2003   CORONARY ANGIOPLASTY WITH STENT PLACEMENT  2000   EP IMPLANTABLE DEVICE N/A 04/15/2016   Procedure: Pacemaker Implant;  Surgeon: Evans Lance, MD; Medtronic (serial number [id number]) pacemaker; Laterality: Left    Social History   Socioeconomic History   Marital status: Widowed    Spouse name: Not on file   Number of children: 3   Years of education: Not on file   Highest education level: Not on file  Occupational History   Occupation: Retired    Comment: Retired  Scientist, product/process development strain: Not on file   Food insecurity    Worry: Not on file    Inability: Not on Lexicographer needs    Medical: Not on file    Non-medical: Not on file  Tobacco Use   Smoking status: Former Smoker    Packs/day: 0.25    Years: 40.00    Pack years: 10.00    Types: Cigarettes    Start date: 10/30/1967    Quit date: 04/14/2016    Years since quitting: 3.2   Smokeless tobacco: Never Used   Tobacco comment: 04-16 quit smoking  Substance and Sexual Activity   Alcohol use: No    Alcohol/week: 0.0 standard drinks    Comment: Rare   Drug use: No   Sexual activity: Never    Comment: wife died in 01/17/2023 married 78 years, grandson livew with patient  Lifestyle   Physical activity    Days per week: Not on file    Minutes per session: Not on file   Stress: Not on file  Relationships   Social connections    Talks on phone: Not on file    Gets together: Not on file    Attends religious service: Not on file    Active member of club or organization: Not on file    Attends meetings of clubs or organizations: Not on file    Relationship status: Not on file   Intimate partner violence    Fear of current or ex partner: Not on file    Emotionally abused: Not on file    Physically abused: Not on file    Forced sexual activity: Not on file  Other Topics Concern   Not on file  Social History Narrative    Not on file    Family History  Problem Relation Age of Onset   Arthritis Mother    Hypertension Mother    Heart disease Mother        Rheumatic fever   Rheumatic fever Mother    Other Mother        brain tumor   Cancer Mother        leukemia   Hypertension Father    Alcohol abuse Father    Diabetes Son    Colon  cancer Neg Hx    Breast cancer Neg Hx    Prostate cancer Neg Hx     ROS: no fevers or chills, productive cough, hemoptysis, dysphasia, odynophagia, melena, hematochezia, dysuria, hematuria, rash, seizure activity, orthopnea, PND, pedal edema, claudication. Remaining systems are negative.  Physical Exam: Well-developed well-nourished in no acute distress.  Skin is warm and dry.  HEENT is normal.  Neck is supple.  Chest is clear to auscultation with normal expansion.  Cardiovascular exam is regular rate and rhythm.  Abdominal exam nontender or distended. No masses palpated. Extremities show no edema. neuro grossly intact  A/P  1 coronary artery disease-patient denies chest pain.  Plan to continue medical therapy with statin.  No aspirin given need for Xarelto.  2 abdominal aortic aneurysm-patient will need follow-up ultrasound November 2020.  3 paroxysmal atrial fibrillation-patient is in sinus rhythm.  Continue beta-blocker for rate control if atrial fibrillation recurs.  Continue amiodarone.  Check TSH.  Had recent chest x-ray.  Recent liver functions normal.  Continue Xarelto.    4 hypertension-patient's blood pressure is controlled.  Continue present medications and follow.  5 hyperlipidemia-continue statin.  6 prior pacemaker-patient is followed by electrophysiology.  7 history of mild to moderate aortic insufficiency-echocardiogram ordered at last office visit but not performed.  Will reorder.  Kirk Ruths, MD

## 2019-06-27 ENCOUNTER — Other Ambulatory Visit: Payer: Self-pay

## 2019-06-27 ENCOUNTER — Encounter: Payer: Self-pay | Admitting: Cardiology

## 2019-06-27 ENCOUNTER — Ambulatory Visit (INDEPENDENT_AMBULATORY_CARE_PROVIDER_SITE_OTHER): Payer: Medicare HMO | Admitting: Cardiology

## 2019-06-27 ENCOUNTER — Telehealth: Payer: Self-pay | Admitting: *Deleted

## 2019-06-27 VITALS — BP 138/76 | HR 89 | Ht 68.0 in | Wt 197.8 lb

## 2019-06-27 DIAGNOSIS — I351 Nonrheumatic aortic (valve) insufficiency: Secondary | ICD-10-CM

## 2019-06-27 DIAGNOSIS — I495 Sick sinus syndrome: Secondary | ICD-10-CM | POA: Diagnosis not present

## 2019-06-27 DIAGNOSIS — I480 Paroxysmal atrial fibrillation: Secondary | ICD-10-CM | POA: Diagnosis not present

## 2019-06-27 DIAGNOSIS — I2510 Atherosclerotic heart disease of native coronary artery without angina pectoris: Secondary | ICD-10-CM

## 2019-06-27 LAB — BCR/ABL

## 2019-06-27 NOTE — Telephone Encounter (Addendum)
Patient is aware of results  ----- Message from Volanda Napoleon, MD sent at 06/27/2019  3:45 PM EDT ----- Call - the [facility] is still in remission!!  Laurey Arrow

## 2019-06-27 NOTE — Patient Instructions (Signed)
Medication Instructions:  NO CHANGE If you need a refill on your cardiac medications before your next appointment, please call your pharmacy.   Lab work: Your physician recommends that you return for lab work [place] If you have labs (blood work) drawn today and your tests are completely normal, you will receive your results only by: Marland Kitchen MyChart Message (if you have MyChart) OR . A paper copy in the mail If you have any lab test that is abnormal or we need to change your treatment, we will call you to review the results.  Testing/Procedures: Your physician has requested that you have an echocardiogram. Echocardiography is a painless test that uses sound waves to create images of your heart. It provides your doctor with information about the size and shape of your heart and how well your heart's chambers and valves are working. This procedure takes approximately one hour. There are no restrictions for this procedure.  AT THE [place] [facility] 1 ST FLOOR IMAGING DEPARTMENT  Follow-Up: At [facility], you and your health needs are our priority.  As part of our continuing mission to provide you with exceptional heart care, we have created designated Provider Care Teams.  These Care Teams include your primary Cardiologist (physician) and Advanced Practice Providers (APPs -  Physician Assistants and Nurse Practitioners) who all work together to provide you with the care you need, when you need it. You will need a follow up appointment in 6 months.  Please call our office 2 months in advance to schedule this appointment.  You may see Kirk Ruths MD or one of the following Advanced Practice Providers on your designated Care Team:   Kerin Ransom, PA-C Roby Lofts, [place] . Sande Rives, PA-C

## 2019-07-11 ENCOUNTER — Other Ambulatory Visit: Payer: Self-pay

## 2019-07-11 ENCOUNTER — Ambulatory Visit (HOSPITAL_BASED_OUTPATIENT_CLINIC_OR_DEPARTMENT_OTHER)
Admission: RE | Admit: 2019-07-11 | Discharge: 2019-07-11 | Disposition: A | Discharge status: Home / Self Care | Payer: Medicare HMO | Source: Ambulatory Visit | Attending: Cardiology | Admitting: Cardiology

## 2019-07-11 DIAGNOSIS — I059 Rheumatic mitral valve disease, unspecified: Secondary | ICD-10-CM

## 2019-07-11 NOTE — Progress Notes (Signed)
  Echocardiogram 2D Echocardiogram has been performed.  Jason [name] 07/11/2019, 10:33 AM

## 2019-07-13 ENCOUNTER — Other Ambulatory Visit: Payer: Self-pay | Admitting: Family Medicine

## 2019-07-18 ENCOUNTER — Other Ambulatory Visit: Payer: Self-pay | Admitting: Family Medicine

## 2019-07-27 ENCOUNTER — Other Ambulatory Visit: Payer: Self-pay | Admitting: Family Medicine

## 2019-08-01 ENCOUNTER — Other Ambulatory Visit: Payer: Self-pay | Admitting: Cardiology

## 2019-08-17 ENCOUNTER — Other Ambulatory Visit: Payer: Self-pay | Admitting: Family Medicine

## 2019-09-04 ENCOUNTER — Telehealth: Payer: Self-pay | Admitting: Family Medicine

## 2019-09-04 NOTE — Telephone Encounter (Signed)
Copied from [place] [id number]. Topic: General - Other >> Sep 04, 2019 11:46 AM Keene Breath wrote: Reason for CRM: Patient called to ask the nurse if he can take Dulcolax as a laxative.  Patient is not sure if he should take it.  CB# [phone]

## 2019-09-04 NOTE — Telephone Encounter (Signed)
Please advise 

## 2019-09-04 NOTE — Telephone Encounter (Signed)
Yes Dulcolax is a safe option. If no response let him know some other options. Encourageincreased hydration and fiber in diet. Daily probiotics. If bowels not moving can use MOM 2 tbls po in 4 oz of warm prune juice by mouth every 2-3 days. If no results then repeat in 4 hours with  Dulcolax suppository pr, may repeat again in 4 more hours as needed. Seek care if symptoms worsen. Consider daily Miralax mixed with Benefiber if symptoms persist.

## 2019-09-05 NOTE — Telephone Encounter (Signed)
Patient notified of PCP recommendations and is agreement and expresses an understanding.   Ok for PEC to Discuss results / PCP recommendations / Schedule patient.   

## 2019-09-13 ENCOUNTER — Other Ambulatory Visit: Payer: Self-pay | Admitting: Family Medicine

## 2019-09-28 ENCOUNTER — Other Ambulatory Visit: Payer: Self-pay | Admitting: Family Medicine

## 2019-10-03 NOTE — Progress Notes (Signed)
Virtual Visit via Video Note  I connected with patient on 10/04/19 at 10:15 AM EDT by audio enabled telemedicine application and verified that I am speaking with the correct person using two identifiers.   THIS ENCOUNTER IS A VIRTUAL VISIT DUE TO COVID-19 - PATIENT WAS NOT SEEN IN THE OFFICE. PATIENT HAS CONSENTED TO VIRTUAL VISIT / TELEMEDICINE VISIT   Location of patient: home  Location of provider: office  I discussed the limitations of evaluation and management by telemedicine and the availability of in person appointments. The patient expressed understanding and agreed to proceed.   Subjective:   Jason [name] is a 73 y.o. male who presents for Medicare Annual/Subsequent preventive examination.  Plays music at [facility] every Monday night.  Review of Systems:  Cardiac Risk Factors include: advanced age (>17mn, >>33women);dyslipidemia;hypertension;male gender Home Safety/Smoke Alarms: Feels safe in home. Smoke alarms in place.  Lives in 1 story w/ basement. Grandson lives w/ him.  Male:   CCS- 09/02/15. 3 yr recall. Pt states he will discuss w/ PCP at next visit.  PSA-  Lab Results  Component Value Date   PSA 0.98 09/22/2018       Objective:    Vitals: Unable to assess. This visit is enabled though telemedicine due to Covid 19.   Advanced Directives 10/04/2019 06/20/2019 05/25/2019 12/19/2018 08/01/2017 11/26/2016 05/27/2016  Does Patient Have a Medical Advance Directive? _0  No No  Would patient like information on creating a medical advance directive? No - Patient declined No - Patient declined - - No - Patient declined No - Patient declined No - patient declined information    Tobacco Social History   Tobacco Use  Smoking Status Former Smoker  . Packs/day: 0.25  . Years: 40.00  . Pack years: 10.00  . Types: Cigarettes  . Start date: 10/30/1967  . Quit date: 04/14/2016  . Years since quitting: 3.4  Smokeless Tobacco Never Used  Tobacco Comment    04-16 quit smoking     Counseling given: Not Answered Comment: 04-16 quit smoking   Clinical Intake: Pain : No/denies pain    Past Medical History:  Diagnosis Date  . Anemia 05/08/2015  . C[place]  . Coronary heart disease   . CVA (cerebrovascular accident) ([place]   . GERD (gastroesophageal reflux disease)   . Grief reaction 03/18/2014  . H/O tobacco use, presenting hazards to health 02/24/2012   No cigarettes since pacemaker placement. Encouraged ongoing efforts   . History of chicken pox   . History of kidney stones   . Hyperlipidemia   . Hypertension   . IBS (irritable bowel syndrome) 09/20/2013  . Medicare annual wellness visit, subsequent 03/23/2014  . PAF (paroxysmal atrial fibrillation) ([place]   . Symptomatic bradycardia    a. symptomatic bradycardia due to sinus node dysfunction s/p RA and RV Medtronic pacemaker 04/15/16   Past Surgical History:  Procedure Laterality Date  . CATARACT EXTRACTION    . CHOLECYSTECTOMY  2003  . CORONARY ANGIOPLASTY WITH STENT PLACEMENT  2000  . EP IMPLANTABLE DEVICE N/A 04/15/2016   Procedure: Pacemaker Implant;  Surgeon: GEvans Lance MD; Medtronic (serial number [id number]) pacemaker; Laterality: Left   Family History  Problem Relation Age of Onset  . Arthritis Mother   . Hypertension Mother   . Heart disease Mother        Rheumatic fever  . Rheumatic fever Mother   . Other Mother  brain tumor  . Cancer Mother        leukemia  . Hypertension Father   . Alcohol abuse Father   . Diabetes Son   . Colon cancer Neg Hx   . Breast cancer Neg Hx   . Prostate cancer Neg Hx    Social History   Socioeconomic History  . Marital status: Widowed    Spouse name: Not on file  . Number of children: 3  . Years of education: Not on file  . Highest education level: Not on file  Occupational History  . Occupation: Retired    Comment: Retired  Scientific laboratory technician  . Financial resource strain:  Not on file  . Food insecurity    Worry: Not on file    Inability: Not on file  . Transportation needs    Medical: Not on file    Non-medical: Not on file  Tobacco Use  . Smoking status: Former Smoker    Packs/day: 0.25    Years: 40.00    Pack years: 10.00    Types: Cigarettes    Start date: 10/30/1967    Quit date: 04/14/2016    Years since quitting: 3.4  . Smokeless tobacco: Never Used  . Tobacco comment: 04-16 quit smoking  Substance and Sexual Activity  . Alcohol use: No    Alcohol/week: 0.0 standard drinks    Comment: Rare  . Drug use: No  . Sexual activity: Never    Comment: wife died in 01-11-2023 married 25 years, grandson livew with patient  Lifestyle  . Physical activity    Days per week: Not on file    Minutes per session: Not on file  . Stress: Not on file  Relationships  . Social Herbalist on phone: Not on file    Gets together: Not on file    Attends religious service: Not on file    Active member of club or organization: Not on file    Attends meetings of clubs or organizations: Not on file    Relationship status: Not on file  Other Topics Concern  . Not on file  Social History Narrative  . Not on file    Outpatient Encounter Medications as of 10/04/2019  Medication Sig  . acetaminophen (TYLENOL) 325 MG tablet Take 650 mg by mouth every 6 (six) hours as needed. For headache.  Marland Kitchen amiodarone (PACERONE) 200 MG tablet TAKE 1 TABLET EVERY DAY  . amLODipine (NORVASC) 5 MG tablet TAKE 1 TABLET (5 MG TOTAL) BY MOUTH DAILY. KEEP OFFICE VISIT  . atorvastatin (LIPITOR) 40 MG tablet TAKE 1 TABLET EVERY DAY    . dicyclomine (BENTYL) 20 MG tablet Take 1 tablet (20 mg total) by mouth 2 (two) times daily.  . ferrous sulfate 325 (65 FE) MG EC tablet Take 1 tablet (325 mg total) by mouth 2 (two) times daily.  . fluticasone (FLONASE) 50 MCG/ACT nasal spray USE 2 SPRAYS IN EACH NOSTRIL EVERY DAY  . losartan (COZAAR) 100 MG tablet TAKE 1 TABLET EVERY DAY  . meclizine  (ANTIVERT) 25 MG tablet TAKE 1 TABLET THREE TIMES DAILY  . metoprolol tartrate (LOPRESSOR) 100 MG tablet TAKE 1 TABLET TWICE DAILY  . metoprolol tartrate (LOPRESSOR) 50 MG tablet Take 50 mg by mouth daily.   Marland Kitchen ofloxacin (FLOXIN OTIC) 0.3 % OTIC solution Place 10 drops into both ears daily. For 7 days  . omeprazole (PRILOSEC) 40 MG capsule TAKE 1 CAPSULE EVERY DAY  . Probiotic Product (PROBIOTIC FORMULA  PO) Take 1 capsule by mouth daily.   [name] 20 MG TABS tablet TAKE 1 TABLET EVERY DAY WITH SUPPER  . imatinib ([name]) 100 MG tablet TAKE 2 TABLETS BY MOUTH ONCE DAILY WITH MEALS AND LARGE GLASS OF WATER (Patient not taking: Reported on 10/04/2019)  . nitroGLYCERIN (NITROSTAT) 0.4 MG SL tablet Place 1 tablet (0.4 mg total) under the tongue every 5 (five) minutes as needed for chest pain. (Patient not taking: Reported on 10/04/2019)  . ondansetron (ZOFRAN) 4 MG tablet TAKE 1 TABLET EVERY 8 HOURS AS NEEDED FOR NAUSEA AND VOMITING (Patient not taking: Reported on 10/04/2019)  . [DISCONTINUED] ranitidine (ZANTAC) 300 MG tablet TAKE 1 TABLET AT BEDTIME   No facility-administered encounter medications on file as of 10/04/2019.     Activities of Daily Living In your present state of health, do you have any difficulty performing the following activities: 10/04/2019  Hearing? N  Vision? N  Difficulty concentrating or making decisions? N  Walking or climbing stairs? N  Dressing or bathing? N  Doing errands, shopping? N  Preparing Food and eating ? N  Using the Toilet? N  In the past six months, have you accidently leaked urine? N  Do you have problems with loss of bowel control? N  Managing your Medications? N  Managing your Finances? N  Housekeeping or managing your Housekeeping? N  Some recent data might be hidden    Patient Care Team: Mosie Lukes, MD as PCP - General (Family Medicine) Constance Haw, MD as PCP - Electrophysiology (Cardiology) Constance Haw, MD as Consulting  Physician (Cardiology)   Assessment:   This is a routine wellness examination for [place]. Physical assessment deferred to PCP.   Exercise Activities and Dietary recommendations Current Exercise Habits: The patient does not participate in regular exercise at present, Exercise limited by: None identified Diet (meal preparation, eat out, water intake, caffeinated beverages, dairy products, fruits and vegetables): 24 hr recall Breakfast: egg and toast Lunch: sandwich Dinner:    Hamburger and mac-n-cheese Needs to drink  More water. Drinks a lot of pepsi (3-4 per day).  Goals    . DIET - INCREASE WATER INTAKE       Fall Risk Fall Risk  10/04/2019 09/22/2018 09/15/2017 11/26/2016 08/06/2016  Falls in the past year? 0 No No No No  Number falls in past yr: 0 - - - -  Injury with Fall? 0 - - - -   Depression Screen PHQ 2/9 Scores 10/04/2019 09/22/2018 09/15/2017 08/06/2016  PHQ - 2 Score 0 0 0 0    Cognitive Function   MMSE - Mini Mental State Exam 10/04/2019  Not completed: Refused        Immunization History  Administered Date(s) Administered  . Pneumococcal Conjugate-13 11/07/2015  . Pneumococcal Polysaccharide-23 09/15/2017  . Tdap 11/07/2015   Screening Tests Health Maintenance  Topic Date Due  . COLONOSCOPY  09/01/2018  . INFLUENZA VACCINE  07/28/2019  . TETANUS/TDAP  11/06/2025  . Hepatitis C Screening  Completed  . PNA vac Low Risk Adult  Completed     Plan:    Please schedule your next medicare wellness visit with me in 1 yr.  Continue to eat heart healthy diet (full of fruits, vegetables, whole grains, lean protein, water--limit salt, fat, and sugar intake) and increase physical activity as tolerated.  Continue doing brain stimulating activities (puzzles, reading, adult coloring books, staying active) to keep memory sharp.     I have personally reviewed  and noted the following in the patient's chart:   . Medical and social history . Use of alcohol, tobacco or  illicit drugs  . Current medications and supplements . Functional ability and status . Nutritional status . Physical activity . Advanced directives . List of other physicians . Hospitalizations, surgeries, and ER visits in previous 12 months . Vitals . Screenings to include cognitive, depression, and falls . Referrals and appointments  In addition, I have reviewed and discussed with patient certain preventive protocols, quality metrics, and best practice recommendations. A written personalized care plan for preventive services as well as general preventive health recommendations were provided to patient.     Shela Nevin, [place]  10/04/2019

## 2019-10-04 ENCOUNTER — Encounter: Payer: Self-pay | Admitting: *Deleted

## 2019-10-04 ENCOUNTER — Other Ambulatory Visit: Payer: Self-pay

## 2019-10-04 ENCOUNTER — Ambulatory Visit: Payer: Medicare HMO | Admitting: *Deleted

## 2019-10-04 DIAGNOSIS — Z0000 Encounter for general adult medical examination without abnormal findings: Secondary | ICD-10-CM

## 2019-10-04 NOTE — Patient Instructions (Signed)
Please schedule your next medicare wellness visit with me in 1 yr.  Continue to eat heart healthy diet (full of fruits, vegetables, whole grains, lean protein, water--limit salt, fat, and sugar intake) and increase physical activity as tolerated.  Continue doing brain stimulating activities (puzzles, reading, adult coloring books, staying active) to keep memory sharp.    Health Maintenance After Age 71 After age 38, you are at a higher risk for certain long-term diseases and infections as well as injuries from falls. Falls are a major cause of broken bones and head injuries in people who are older than age 75. Getting regular preventive care can help to keep you healthy and well. Preventive care includes getting regular testing and making lifestyle changes as recommended by your health care provider. Talk with your health care provider about:  Which screenings and tests you should have. A screening is a test that checks for a disease when you have no symptoms.  A diet and exercise plan that is right for you. What should I know about screenings and tests to prevent falls? Screening and testing are the best ways to find a health problem early. Early diagnosis and treatment give you the best chance of managing medical conditions that are common after age 75. Certain conditions and lifestyle choices may make you more likely to have a fall. Your health care provider may recommend:  Regular vision checks. Poor vision and conditions such as cataracts can make you more likely to have a fall. If you wear glasses, make sure to get your prescription updated if your vision changes.  Medicine review. Work with your health care provider to regularly review all of the medicines you are taking, including over-the-counter medicines. Ask your health care provider about any side effects that may make you more likely to have a fall. Tell your health care provider if any medicines that you take make you feel dizzy or  sleepy.  Osteoporosis screening. Osteoporosis is a condition that causes the bones to get weaker. This can make the bones weak and cause them to break more easily.  Blood pressure screening. Blood pressure changes and medicines to control blood pressure can make you feel dizzy.  Strength and balance checks. Your health care provider may recommend certain tests to check your strength and balance while standing, walking, or changing positions.  Foot health exam. Foot pain and numbness, as well as not wearing proper footwear, can make you more likely to have a fall.  Depression screening. You may be more likely to have a fall if you have a fear of falling, feel emotionally low, or feel unable to do activities that you used to do.  Alcohol use screening. Using too much alcohol can affect your balance and may make you more likely to have a fall. What actions can I take to lower my risk of falls? General instructions  Talk with your health care provider about your risks for falling. Tell your health care provider if: ? You fall. Be sure to tell your health care provider about all falls, even ones that seem minor. ? You feel dizzy, sleepy, or off-balance.  Take over-the-counter and prescription medicines only as told by your health care provider. These include any supplements.  Eat a healthy diet and maintain a healthy weight. A healthy diet includes low-fat dairy products, low-fat (lean) meats, and fiber from whole grains, beans, and lots of fruits and vegetables. Home safety  Remove any tripping hazards, such as rugs, cords, and clutter.  Install safety equipment such as grab bars in bathrooms and safety rails on stairs.  Keep rooms and walkways well-lit. Activity   Follow a regular exercise program to stay fit. This will help you maintain your balance. Ask your health care provider what types of exercise are appropriate for you.  If you need a cane or walker, use it as recommended by  your health care provider.  Wear supportive shoes that have nonskid soles. Lifestyle  Do not drink alcohol if your health care provider tells you not to drink.  If you drink alcohol, limit how much you have: ? 0-1 drink a day for women. ? 0-2 drinks a day for men.  Be aware of how much alcohol is in your drink. In the [place] one drink equals one typical bottle of beer (12 oz), one-half glass of wine (5 oz), or one shot of hard liquor (1 oz).  Do not use any products that contain nicotine or tobacco, such as cigarettes and e-cigarettes. If you need help quitting, ask your health care provider. Summary  Having a healthy lifestyle and getting preventive care can help to protect your health and wellness after age 69.  Screening and testing are the best way to find a health problem early and help you avoid having a fall. Early diagnosis and treatment give you the best chance for managing medical conditions that are more common for people who are older than age 69.  Falls are a major cause of broken bones and head injuries in people who are older than age 51. Take precautions to prevent a fall at home.  Work with your health care provider to learn what changes you can make to improve your health and wellness and to prevent falls. This information is not intended to replace advice given to you by your health care provider. Make sure you discuss any questions you have with your health care provider. Document Released: 10/26/2017 Document Revised: 04/05/2019 Document Reviewed: 10/26/2017 Elsevier Patient Education  2020 [employer].  Jason [name] , Thank you for taking time to come for your Medicare Wellness Visit. I appreciate your ongoing commitment to your health goals. Please review the following plan we discussed and let me know if I can assist you in the future.   These are the goals we discussed: Goals    . DIET - INCREASE WATER INTAKE       This is a list of the screening recommended  for you and due dates:  Health Maintenance  Topic Date Due  . Colon Cancer Screening  09/01/2018  . Flu Shot  07/28/2019  . Tetanus Vaccine  11/06/2025  .  Hepatitis C: One time screening is recommended by Center for Disease Control  (CDC) for  adults born from [birth year] through 1963.   Completed  . Pneumonia vaccines  Completed

## 2019-10-22 ENCOUNTER — Other Ambulatory Visit: Payer: Self-pay

## 2019-10-22 ENCOUNTER — Encounter: Payer: Self-pay | Admitting: Hematology & Oncology

## 2019-10-22 ENCOUNTER — Telehealth: Payer: Self-pay | Admitting: Hematology & Oncology

## 2019-10-22 ENCOUNTER — Inpatient Hospital Stay: Payer: Medicare HMO | Attending: Hematology & Oncology | Admitting: Hematology & Oncology

## 2019-10-22 ENCOUNTER — Inpatient Hospital Stay: Payer: Medicare HMO

## 2019-10-22 VITALS — BP 129/76 | HR 88 | Temp 96.8°F | Resp 17 | Wt 189.0 lb

## 2019-10-22 DIAGNOSIS — Z7901 Long term (current) use of anticoagulants: Secondary | ICD-10-CM | POA: Insufficient documentation

## 2019-10-22 DIAGNOSIS — Z950 Presence of cardiac pacemaker: Secondary | ICD-10-CM | POA: Diagnosis not present

## 2019-10-22 DIAGNOSIS — C9210 Chronic myeloid leukemia, BCR/ABL-positive, not having achieved remission: Secondary | ICD-10-CM

## 2019-10-22 DIAGNOSIS — C9211 Chronic myeloid leukemia, BCR/ABL-positive, in remission: Secondary | ICD-10-CM | POA: Diagnosis not present

## 2019-10-22 DIAGNOSIS — I480 Paroxysmal atrial fibrillation: Secondary | ICD-10-CM | POA: Insufficient documentation

## 2019-10-22 DIAGNOSIS — Z79899 Other long term (current) drug therapy: Secondary | ICD-10-CM | POA: Diagnosis not present

## 2019-10-22 LAB — CBC WITH DIFFERENTIAL (CANCER CENTER ONLY)
Abs Immature Granulocytes: 0.07 10*3/uL (ref 0.00–0.07)
Basophils Absolute: 0.1 10*3/uL (ref 0.0–0.1)
Basophils Relative: 1 %
Eosinophils Absolute: 0.4 10*3/uL (ref 0.0–0.5)
Eosinophils Relative: 4 %
HCT: 38 % — ABNORMAL LOW (ref 39.0–52.0)
Hemoglobin: 11.8 g/dL — ABNORMAL LOW (ref 13.0–17.0)
Immature Granulocytes: 1 %
Lymphocytes Relative: 16 %
Lymphs Abs: 1.6 10*3/uL (ref 0.7–4.0)
MCH: 29.1 pg (ref 26.0–34.0)
MCHC: 31.1 g/dL (ref 30.0–36.0)
MCV: 93.8 fL (ref 80.0–100.0)
Monocytes Absolute: 0.9 10*3/uL (ref 0.1–1.0)
Monocytes Relative: 8 %
Neutro Abs: 7.3 10*3/uL (ref 1.7–7.7)
Neutrophils Relative %: 70 %
Platelet Count: 320 10*3/uL (ref 150–400)
RBC: 4.05 MIL/uL — ABNORMAL LOW (ref 4.22–5.81)
RDW: 13.6 % (ref 11.5–15.5)
WBC Count: 10.4 10*3/uL (ref 4.0–10.5)
nRBC: 0 % (ref 0.0–0.2)

## 2019-10-22 LAB — CMP (CANCER CENTER ONLY)
ALT: 12 U/L (ref 0–44)
AST: 14 U/L — ABNORMAL LOW (ref 15–41)
Albumin: 4.3 g/dL (ref 3.5–5.0)
Alkaline Phosphatase: 72 U/L (ref 38–126)
Anion gap: 5 (ref 5–15)
BUN: 13 mg/dL (ref 8–23)
CO2: 27 mmol/L (ref 22–32)
Calcium: 9.3 mg/dL (ref 8.9–10.3)
Chloride: 103 mmol/L (ref 98–111)
Creatinine: 1.9 mg/dL — ABNORMAL HIGH (ref 0.61–1.24)
GFR, Est AFR Am: 40 mL/min — ABNORMAL LOW (ref >60)
GFR, Estimated: 35 mL/min — ABNORMAL LOW (ref >60)
Glucose, Bld: 96 mg/dL (ref 70–99)
Potassium: 5.4 mmol/L — ABNORMAL HIGH (ref 3.5–5.1)
Sodium: 135 mmol/L (ref 135–145)
Total Bilirubin: 0.3 mg/dL (ref 0.3–1.2)
Total Protein: 7.2 g/dL (ref 6.5–8.1)

## 2019-10-22 NOTE — Progress Notes (Signed)
Hematology and Oncology Follow Up Visit  Jason [name] [id number] 1946/10/24 73 y.o. 10/22/2019   Principle Diagnosis:  Chronic phase CML - MMR Paroxysmal atrial fibrillation - has pacemaker   Current Therapy:   [name] 200 mg by mouth daily -- d/c on 06/20/2019 Xarelto 20 mg by mouth daily   Interim History:  Jason [name] is here today for follow-up.  We saw him back in June.  So far, everything is doing pretty well with him.  He has been off the [facility] since June.  We saw him back in June, there is no detectable BCR/ABL abnormality.  He is doing okay.  He is eating well.  He has had no problems with nausea or vomiting.  There is been no change in bowel or bladder habits.  He has had no bleeding.  He has had no leg swelling.  Para he does have the atrial fibrillation.  He is on Xarelto.  He sees his cardiologist in December.  He has a pacemaker.  Overall, his performance status is ECOG 1.  Medications:  Allergies as of 10/22/2019   No Known Allergies     Medication List       Accurate as of October 22, 2019 11:06 AM. If you have any questions, ask your nurse or doctor.        STOP taking these medications   imatinib 100 MG tablet Commonly known as: [name] Stopped by: Volanda Napoleon, MD     TAKE these medications   acetaminophen 325 MG tablet Commonly known as: TYLENOL Take 650 mg by mouth every 6 (six) hours as needed. For headache.   amiodarone 200 MG tablet Commonly known as: PACERONE TAKE 1 TABLET EVERY DAY   amLODipine 5 MG tablet Commonly known as: NORVASC TAKE 1 TABLET (5 MG TOTAL) BY MOUTH DAILY. KEEP OFFICE VISIT   atorvastatin 40 MG tablet Commonly known as: LIPITOR TAKE 1 TABLET EVERY DAY   dicyclomine 20 MG tablet Commonly known as: BENTYL Take 1 tablet (20 mg total) by mouth 2 (two) times daily.   ferrous sulfate 325 (65 FE) MG EC tablet Take 1 tablet (325 mg total) by mouth 2 (two) times daily.   fluticasone 50 MCG/ACT nasal spray  Commonly known as: FLONASE USE 2 SPRAYS IN EACH NOSTRIL EVERY DAY   losartan 100 MG tablet Commonly known as: COZAAR TAKE 1 TABLET EVERY DAY   meclizine 25 MG tablet Commonly known as: ANTIVERT TAKE 1 TABLET THREE TIMES DAILY   metoprolol tartrate 50 MG tablet Commonly known as: LOPRESSOR Take 50 mg by mouth daily.   metoprolol tartrate 100 MG tablet Commonly known as: LOPRESSOR TAKE 1 TABLET TWICE DAILY   nitroGLYCERIN 0.4 MG SL tablet Commonly known as: NITROSTAT Place 1 tablet (0.4 mg total) under the tongue every 5 (five) minutes as needed for chest pain.   ofloxacin 0.3 % OTIC solution Commonly known as: Floxin Otic Place 10 drops into both ears daily. For 7 days   omeprazole 40 MG capsule Commonly known as: PRILOSEC TAKE 1 CAPSULE EVERY DAY   ondansetron 4 MG tablet Commonly known as: ZOFRAN TAKE 1 TABLET EVERY 8 HOURS AS NEEDED FOR NAUSEA AND VOMITING   PROBIOTIC FORMULA PO Take 1 capsule by mouth daily.   Xarelto 20 MG Tabs tablet Generic drug: rivaroxaban TAKE 1 TABLET EVERY DAY WITH SUPPER       Allergies: No Known Allergies  Past Medical History, Surgical history, Social history, and Family History were reviewed and  updated.  Review of Systems: Review of Systems  Constitutional: Negative.   HENT: Negative.   Eyes: Negative.   Respiratory: Negative.   Cardiovascular: Negative.   Gastrointestinal: Negative.   Genitourinary: Negative.   Musculoskeletal: Negative.   Skin: Negative.   Neurological: Negative.   Endo/Heme/Allergies: Negative.   Psychiatric/Behavioral: Negative.      Physical Exam:  weight is 189 lb (85.7 kg). His temporal temperature is 96.8 F (36 C) (abnormal). His blood pressure is 129/76 and his pulse is 88. His respiration is 17 and oxygen saturation is 97%.   Wt Readings from Last 3 Encounters:  10/22/19 189 lb (85.7 kg)  06/27/19 197 lb 12.8 oz (89.7 kg)  06/20/19 199 lb 6.4 oz (90.4 kg)    Physical Exam Vitals  signs reviewed.  HENT:     Head: Normocephalic and atraumatic.  Eyes:     Pupils: Pupils are equal, round, and reactive to light.  Neck:     Musculoskeletal: Normal range of motion.  Cardiovascular:     Rate and Rhythm: Normal rate and regular rhythm.     Heart sounds: Normal heart sounds.  Pulmonary:     Effort: Pulmonary effort is normal.     Breath sounds: Normal breath sounds.  Abdominal:     General: Bowel sounds are normal.     Palpations: Abdomen is soft.  Musculoskeletal: Normal range of motion.        General: No tenderness or deformity.  Lymphadenopathy:     Cervical: No cervical adenopathy.  Skin:    General: Skin is warm and dry.     Findings: No erythema or rash.  Neurological:     Mental Status: He is alert and oriented to person, place, and time.  Psychiatric:        Behavior: Behavior normal.        Thought Content: Thought content normal.        Judgment: Judgment normal.      Lab Results  Component Value Date   WBC 10.4 10/22/2019   HGB 11.8 (L) 10/22/2019   HCT 38.0 (L) 10/22/2019   MCV 93.8 10/22/2019   PLT 320 10/22/2019   Lab Results  Component Value Date   IRON 84 05/27/2015   TIBC 359 05/27/2015   UIBC 275 05/27/2015   IRONPCTSAT 23 05/27/2015   Lab Results  Component Value Date   RETICCTPCT 1.2 05/27/2015   RBC 4.05 (L) 10/22/2019   RETICCTABS 48.2 05/27/2015   No results found for: KPAFRELGTCHN, LAMBDASER, KAPLAMBRATIO No results found for: IGGSERUM, IGA, IGMSERUM No results found for: Odetta Pink, SPEI   Chemistry      Component Value Date/Time   NA 135 10/22/2019 0935   NA 142 12/12/2018 1308   NA 143 11/26/2016 1030   K 5.4 (H) 10/22/2019 0935   K 3.8 11/26/2016 1030   CL 103 10/22/2019 0935   CL 109 (H) 03/31/2015 1119   CO2 27 10/22/2019 0935   CO2 25 11/26/2016 1030   BUN 13 10/22/2019 0935   BUN 8 12/12/2018 1308   BUN 5.1 (L) 11/26/2016 1030   CREATININE  1.90 (H) 10/22/2019 0935   CREATININE 1.0 11/26/2016 1030      Component Value Date/Time   CALCIUM 9.3 10/22/2019 0935   CALCIUM 9.3 11/26/2016 1030   ALKPHOS 72 10/22/2019 0935   ALKPHOS 106 11/26/2016 1030   AST 14 (L) 10/22/2019 0935   AST 16 11/26/2016 1030  ALT 12 10/22/2019 0935   ALT 10 11/26/2016 1030   BILITOT 0.3 10/22/2019 0935   BILITOT 0.53 11/26/2016 1030      Impression and Plan: Jason [name] is a very pleasant 79 this morningyo caucasian male with chronic phase CML.  He is in a major molecular remission.  Again, he has been in a major molecular remission for 5 years.  I will plan to see him back in 4 months.  I want to make sure that we have close follow-up for.  I have a good feeling that he will maintain a remission off [place].   Volanda Napoleon, MD 10/26/202011:06 AM

## 2019-10-22 NOTE — Telephone Encounter (Signed)
Appointments scheduled and calendar was printed per 10/26 los

## 2019-10-31 ENCOUNTER — Other Ambulatory Visit: Payer: Self-pay | Admitting: Cardiology

## 2019-10-31 LAB — BCR/ABL

## 2019-11-01 ENCOUNTER — Other Ambulatory Visit: Payer: Self-pay | Admitting: Hematology & Oncology

## 2019-11-01 MED ORDER — IMATINIB MESYLATE 400 MG PO TABS: 400.0000 mg | ORAL_TABLET | Freq: Every day | ORAL | 3 refills | Status: DC

## 2019-11-02 ENCOUNTER — Telehealth: Payer: Self-pay | Admitting: Hematology & Oncology

## 2019-11-09 ENCOUNTER — Other Ambulatory Visit: Payer: Self-pay | Admitting: *Deleted

## 2019-11-09 DIAGNOSIS — I714 Abdominal aortic aneurysm, without rupture, unspecified: Secondary | ICD-10-CM

## 2019-11-19 ENCOUNTER — Telehealth: Payer: Self-pay | Admitting: Cardiology

## 2019-11-19 NOTE — Telephone Encounter (Signed)
Spoke with pt, per dr Stanford Breed will reschedule abdominal duplex to the [place] location.

## 2019-11-19 NOTE — Telephone Encounter (Signed)
Patient is returning phone call.  °

## 2019-11-19 NOTE — Telephone Encounter (Signed)
Spoke with pt, he is wanting to do his abdominal US for AAA at the [place] location instead of here. Looking at his chart, he had a CTA in 04/2019 and recommended fu CTA. Will find out from dr Stanford Breed what testing is needed and let the patient know.

## 2019-11-27 ENCOUNTER — Other Ambulatory Visit: Payer: Self-pay

## 2019-11-27 ENCOUNTER — Ambulatory Visit (HOSPITAL_BASED_OUTPATIENT_CLINIC_OR_DEPARTMENT_OTHER)
Admission: RE | Admit: 2019-11-27 | Discharge: 2019-11-27 | Disposition: A | Discharge status: Home / Self Care | Payer: Medicare HMO | Source: Ambulatory Visit | Attending: Cardiology | Admitting: Cardiology

## 2019-11-27 DIAGNOSIS — I714 Abdominal aortic aneurysm, without rupture, unspecified: Secondary | ICD-10-CM

## 2019-11-27 NOTE — Progress Notes (Signed)
Abdominal Aortic Doppler performed     11/27/19 Cardell Peach RDCS, RVT

## 2019-11-28 ENCOUNTER — Other Ambulatory Visit: Payer: Self-pay | Admitting: *Deleted

## 2019-11-28 ENCOUNTER — Encounter: Payer: Self-pay | Admitting: *Deleted

## 2019-11-28 DIAGNOSIS — I714 Abdominal aortic aneurysm, without rupture, unspecified: Secondary | ICD-10-CM

## 2019-11-29 ENCOUNTER — Other Ambulatory Visit (HOSPITAL_COMMUNITY): Payer: Medicare HMO

## 2019-12-03 ENCOUNTER — Other Ambulatory Visit: Payer: Self-pay | Admitting: Family Medicine

## 2019-12-07 NOTE — Progress Notes (Signed)
HPI: FU coronary artery disease and atrial fibrillation. Patient had stents placed at [facility] in 2000. Records are not available. Patient seen preoperatively at [facility] on September 01 2012 and electrocardiogram showed atrial fibrillation. TSH in September of 2013 was 1.612. Patient has been maintained on anticoagulation. Monitor April 2016 showed paroxysmal atrial fibrillation and Toprol increased. Nuclear study April 2017 showed ejection fraction 43%, inferior and lateral infarct but no ischemia. Has had pacemaker placed secondary to tachybradycardia syndrome. Echocardiogram July 2020 showed ejection fraction 50 to [id number], grade 1 diastolic dysfunction, mild to moderate aortic insufficiency and mildly dilated ascending aorta.  Abdominal ultrasound December 2020 showed 4.4 cm abdominal aortic aneurysm.  Since last seen,he has some dyspnea on exertion but no orthopnea, PND, pedal edema, chest pain or syncope.  Recent episode of nausea and diaphoresis after eating chicken wings by his report but no associated chest pain.  Current Outpatient Medications  Medication Sig Dispense Refill  . acetaminophen (TYLENOL) 325 MG tablet Take 650 mg by mouth every 6 (six) hours as needed. For headache.    Marland Kitchen amiodarone (PACERONE) 200 MG tablet TAKE 1 TABLET EVERY DAY 90 tablet 3  . amLODipine (NORVASC) 5 MG tablet TAKE 1 TABLET (5 MG TOTAL) BY MOUTH DAILY. KEEP OFFICE VISIT 90 tablet 3  . atorvastatin (LIPITOR) 40 MG tablet TAKE 1 TABLET EVERY DAY   90 tablet 3  . dicyclomine (BENTYL) 20 MG tablet Take 1 tablet (20 mg total) by mouth 2 (two) times daily. 20 tablet 0  . ferrous sulfate 325 (65 FE) MG EC tablet Take 1 tablet (325 mg total) by mouth 2 (two) times daily. 60 tablet 11  . fluticasone (FLONASE) 50 MCG/ACT nasal spray USE 2 SPRAYS IN EACH NOSTRIL EVERY DAY 48 g 3  . imatinib ([name]) 400 MG tablet Take 1 tablet (400 mg total) by mouth daily. Take with meals and large glass of  water.Caution:Chemotherapy 90 tablet 3  . losartan (COZAAR) 100 MG tablet TAKE 1 TABLET EVERY DAY 90 tablet 0  . meclizine (ANTIVERT) 25 MG tablet TAKE 1 TABLET THREE TIMES DAILY 90 tablet 0  . metoprolol tartrate (LOPRESSOR) 100 MG tablet TAKE 1 TABLET TWICE DAILY 180 tablet 2  . metoprolol tartrate (LOPRESSOR) 50 MG tablet Take 50 mg by mouth daily.     . nitroGLYCERIN (NITROSTAT) 0.4 MG SL tablet Place 1 tablet (0.4 mg total) under the tongue every 5 (five) minutes as needed for chest pain. 25 tablet 1  . ofloxacin (FLOXIN OTIC) 0.3 % OTIC solution Place 10 drops into both ears daily. For 7 days 10 mL 0  . omeprazole (PRILOSEC) 40 MG capsule TAKE 1 CAPSULE EVERY DAY 90 capsule 3  . ondansetron (ZOFRAN) 4 MG tablet TAKE 1 TABLET EVERY 8 HOURS AS NEEDED FOR NAUSEA AND VOMITING 40 tablet 1  . Probiotic Product (PROBIOTIC FORMULA PO) Take 1 capsule by mouth daily.     [name] 20 MG TABS tablet TAKE 1 TABLET EVERY DAY WITH SUPPER 90 tablet 2   No current facility-administered medications for this visit.     Past Medical History:  Diagnosis Date  . Anemia 05/08/2015  . CML (chronic myelocytic leukemia) ([place]) 12/16/2011  . Coronary heart disease   . CVA (cerebrovascular accident) ([place])   . GERD (gastroesophageal reflux disease)   . Grief reaction 03/18/2014  . H/O tobacco use, presenting hazards to health 02/24/2012   No cigarettes since pacemaker placement. Encouraged ongoing efforts   .  History of chicken pox   . History of kidney stones   . Hyperlipidemia   . Hypertension   . IBS (irritable bowel syndrome) 09/20/2013  . Medicare annual wellness visit, subsequent 03/23/2014  . PAF (paroxysmal atrial fibrillation) ([place])   . Symptomatic bradycardia    a. symptomatic bradycardia due to sinus node dysfunction s/p RA and RV Medtronic pacemaker 04/15/16    Past Surgical History:  Procedure Laterality Date  . CATARACT EXTRACTION    . CHOLECYSTECTOMY  2003  . CORONARY ANGIOPLASTY WITH  STENT PLACEMENT  2000  . EP IMPLANTABLE DEVICE N/A 04/15/2016   Procedure: Pacemaker Implant;  Surgeon: Evans Lance, MD; Medtronic (serial number [id number]) pacemaker; Laterality: Left    Social History   Socioeconomic History  . Marital status: Widowed    Spouse name: Not on file  . Number of children: 3  . Years of education: Not on file  . Highest education level: Not on file  Occupational History  . Occupation: Retired    Comment: Retired  Immunologist  . Smoking status: Former Smoker    Packs/day: 0.25    Years: 40.00    Pack years: 10.00    Types: Cigarettes    Start date: 10/30/1967    Quit date: 04/14/2016    Years since quitting: 3.6  . Smokeless tobacco: Never Used  . Tobacco comment: 04-16 quit smoking  Substance and Sexual Activity  . Alcohol use: No    Alcohol/week: 0.0 standard drinks    Comment: Rare  . Drug use: No  . Sexual activity: Never    Comment: wife died in 02/06/23 married 31 years, grandson livew with patient  Other Topics Concern  . Not on file  Social History Narrative  . Not on file   Social Determinants of Health   Financial Resource Strain:   . Difficulty of Paying Living Expenses: Not on file  Food Insecurity:   . Worried About Charity fundraiser in the Last Year: Not on file  . Ran Out of Food in the Last Year: Not on file  Transportation Needs:   . Lack of Transportation (Medical): Not on file  . Lack of Transportation (Non-Medical): Not on file  Physical Activity:   . Days of Exercise per Week: Not on file  . Minutes of Exercise per Session: Not on file  Stress:   . Feeling of Stress : Not on file  Social Connections:   . Frequency of Communication with Friends and Family: Not on file  . Frequency of Social Gatherings with Friends and Family: Not on file  . Attends Religious Services: Not on file  . Active Member of Clubs or Organizations: Not on file  . Attends Archivist Meetings: Not on file  . Marital Status:  Not on file  Intimate Partner Violence:   . Fear of Current or Ex-Partner: Not on file  . Emotionally Abused: Not on file  . Physically Abused: Not on file  . Sexually Abused: Not on file    Family History  Problem Relation Age of Onset  . Arthritis Mother   . Hypertension Mother   . Heart disease Mother        Rheumatic fever  . Rheumatic fever Mother   . Other Mother        brain tumor  . Cancer Mother        leukemia  . Hypertension Father   . Alcohol abuse Father   . Diabetes Son   .  Colon cancer Neg Hx   . Breast cancer Neg Hx   . Prostate cancer Neg Hx     ROS: no fevers or chills, productive cough, hemoptysis, dysphasia, odynophagia, melena, hematochezia, dysuria, hematuria, rash, seizure activity, orthopnea, PND, pedal edema, claudication. Remaining systems are negative.  Physical Exam: Well-developed well-nourished in no acute distress.  Skin is warm and dry.  HEENT is normal.  Neck is supple.  Chest is clear to auscultation with normal expansion.  Cardiovascular exam is regular rate and rhythm.  Abdominal exam nontender or distended. No masses palpated. Extremities show no edema. neuro grossly intact  ECG-sinus rhythm at a rate of 88, first-degree AV block, left axis deviation, left ventricular hypertrophy, right bundle branch block.  Personally reviewed  A/P  1 coronary artery disease-patient doing well with no chest pain.  Continue statin.  Patient is not on aspirin given need for Xarelto.  2 abdominal aortic aneurysm-plan follow-up ultrasound Dec 2021.  3 hypertension-patient's blood pressure is controlled today.  Continue present medical regimen.  4 hyperlipidemia-continue statin.  5 paroxysmal atrial fibrillation-plan to continue beta-blocker at present dose.  Continue amiodarone.  Continue Xarelto.  Check renal function, TSH and chest x-ray. Recent liver functions normal.  6 pacemaker-followed by electrophysiology.  7 mild to moderate aortic  insufficiency-patient will need follow-up echoes in the future.  Kirk Ruths, MD

## 2019-12-12 ENCOUNTER — Ambulatory Visit (INDEPENDENT_AMBULATORY_CARE_PROVIDER_SITE_OTHER): Payer: Medicare HMO | Admitting: Cardiology

## 2019-12-12 ENCOUNTER — Other Ambulatory Visit: Payer: Self-pay

## 2019-12-12 ENCOUNTER — Ambulatory Visit (HOSPITAL_BASED_OUTPATIENT_CLINIC_OR_DEPARTMENT_OTHER)
Admission: RE | Admit: 2019-12-12 | Discharge: 2019-12-12 | Disposition: A | Discharge status: Home / Self Care | Payer: Medicare HMO | Source: Ambulatory Visit | Attending: Cardiology | Admitting: Cardiology

## 2019-12-12 ENCOUNTER — Encounter: Payer: Self-pay | Admitting: Cardiology

## 2019-12-12 VITALS — BP 134/82 | HR 80 | Ht 68.0 in | Wt 197.8 lb

## 2019-12-12 DIAGNOSIS — I714 Abdominal aortic aneurysm, without rupture, unspecified: Secondary | ICD-10-CM

## 2019-12-12 DIAGNOSIS — I480 Paroxysmal atrial fibrillation: Secondary | ICD-10-CM

## 2019-12-12 DIAGNOSIS — I2510 Atherosclerotic heart disease of native coronary artery without angina pectoris: Secondary | ICD-10-CM | POA: Diagnosis not present

## 2019-12-12 DIAGNOSIS — I351 Nonrheumatic aortic (valve) insufficiency: Secondary | ICD-10-CM | POA: Diagnosis not present

## 2019-12-12 DIAGNOSIS — I4891 Unspecified atrial fibrillation: Secondary | ICD-10-CM | POA: Diagnosis not present

## 2019-12-12 NOTE — Patient Instructions (Signed)
Medication Instructions:  NO CHANGE *If you need a refill on your cardiac medications before your next appointment, please call your pharmacy*  Lab Work: Your physician recommends that you HAVE LAB WORK TODAY  If you have labs (blood work) drawn today and your tests are completely normal, you will receive your results only by: Marland Kitchen MyChart Message (if you have MyChart) OR . A paper copy in the mail If you have any lab test that is abnormal or we need to change your treatment, we will call you to review the results.  Testing/Procedures: A chest x-ray takes a picture of the organs and structures inside the chest, including the heart, lungs, and blood vessels. This test can show several things, including, whether the heart is enlarges; whether fluid is building up in the lungs; and whether pacemaker / defibrillator leads are still in place. AT THE [place] [facility]  Follow-Up: At [facility], you and your health needs are our priority.  As part of our continuing mission to provide you with exceptional heart care, we have created designated Provider Care Teams.  These Care Teams include your primary Cardiologist (physician) and Advanced Practice Providers (APPs -  Physician Assistants and Nurse Practitioners) who all work together to provide you with the care you need, when you need it.  Your next appointment:   6 month(s)  The format for your next appointment:   Either In Person or Virtual  Provider:   Kirk Ruths, MD

## 2019-12-13 ENCOUNTER — Telehealth: Payer: Self-pay | Admitting: *Deleted

## 2019-12-13 ENCOUNTER — Encounter: Payer: Self-pay | Admitting: *Deleted

## 2019-12-13 DIAGNOSIS — R7989 Other specified abnormal findings of blood chemistry: Secondary | ICD-10-CM | POA: Diagnosis not present

## 2019-12-13 DIAGNOSIS — I2510 Atherosclerotic heart disease of native coronary artery without angina pectoris: Secondary | ICD-10-CM | POA: Diagnosis not present

## 2019-12-13 DIAGNOSIS — I480 Paroxysmal atrial fibrillation: Secondary | ICD-10-CM | POA: Diagnosis not present

## 2019-12-13 DIAGNOSIS — E875 Hyperkalemia: Secondary | ICD-10-CM | POA: Diagnosis not present

## 2019-12-13 DIAGNOSIS — I714 Abdominal aortic aneurysm, without rupture: Secondary | ICD-10-CM | POA: Diagnosis not present

## 2019-12-13 DIAGNOSIS — I351 Nonrheumatic aortic (valve) insufficiency: Secondary | ICD-10-CM | POA: Diagnosis not present

## 2019-12-13 LAB — BASIC METABOLIC PANEL
BUN/Creatinine Ratio: 8 — ABNORMAL LOW (ref 10–24)
BUN: 10 mg/dL (ref 8–27)
CO2: 24 mmol/L (ref 20–29)
Calcium: 9.4 mg/dL (ref 8.6–10.2)
Chloride: 105 mmol/L (ref 96–106)
Creatinine, Ser: 1.22 mg/dL (ref 0.76–1.27)
GFR calc Af Amer: 69 mL/min/{1.73_m2} (ref >59)
GFR calc non Af Amer: 59 mL/min/{1.73_m2} — ABNORMAL LOW (ref >59)
Glucose: 94 mg/dL (ref 65–99)
Potassium: 6 mmol/L — ABNORMAL HIGH (ref 3.5–5.2)
Sodium: 140 mmol/L (ref 134–144)

## 2019-12-13 LAB — TSH: TSH: 8.23 u[IU]/mL — ABNORMAL HIGH (ref 0.450–4.500)

## 2019-12-13 NOTE — Telephone Encounter (Addendum)
Spoke with pt, voiced understanding not to take losartan and will go to [place] office for repeat bmp and free T4.   ----- Message from Lelon Perla, MD sent at 12/13/2019  7:18 AM EST ----- Hold losartan; make sure he is not on K supplement; low k diet; repeat K today Kirk Ruths

## 2019-12-14 LAB — BASIC METABOLIC PANEL
BUN/Creatinine Ratio: 10 (ref 10–24)
BUN: 13 mg/dL (ref 8–27)
CO2: 22 mmol/L (ref 20–29)
Calcium: 9 mg/dL (ref 8.6–10.2)
Chloride: 107 mmol/L — ABNORMAL HIGH (ref 96–106)
Creatinine, Ser: 1.28 mg/dL — ABNORMAL HIGH (ref 0.76–1.27)
GFR calc Af Amer: 65 mL/min/{1.73_m2} (ref >59)
GFR calc non Af Amer: 56 mL/min/{1.73_m2} — ABNORMAL LOW (ref >59)
Glucose: 90 mg/dL (ref 65–99)
Potassium: 4.8 mmol/L (ref 3.5–5.2)
Sodium: 141 mmol/L (ref 134–144)

## 2019-12-14 LAB — T4, FREE: Free T4: 1.35 ng/dL (ref 0.82–1.77)

## 2019-12-31 ENCOUNTER — Ambulatory Visit (INDEPENDENT_AMBULATORY_CARE_PROVIDER_SITE_OTHER): Payer: Medicare HMO | Admitting: *Deleted

## 2019-12-31 DIAGNOSIS — I495 Sick sinus syndrome: Secondary | ICD-10-CM

## 2020-01-02 ENCOUNTER — Inpatient Hospital Stay: Payer: Medicare HMO | Attending: Hematology & Oncology

## 2020-01-02 ENCOUNTER — Inpatient Hospital Stay (HOSPITAL_BASED_OUTPATIENT_CLINIC_OR_DEPARTMENT_OTHER): Payer: Medicare HMO | Admitting: Hematology & Oncology

## 2020-01-02 ENCOUNTER — Telehealth: Payer: Self-pay

## 2020-01-02 ENCOUNTER — Encounter: Payer: Self-pay | Admitting: Hematology & Oncology

## 2020-01-02 ENCOUNTER — Other Ambulatory Visit: Payer: Self-pay

## 2020-01-02 VITALS — BP 132/77 | HR 92 | Temp 97.9°F | Resp 19 | Wt 197.0 lb

## 2020-01-02 DIAGNOSIS — C9210 Chronic myeloid leukemia, BCR/ABL-positive, not having achieved remission: Secondary | ICD-10-CM

## 2020-01-02 DIAGNOSIS — I480 Paroxysmal atrial fibrillation: Secondary | ICD-10-CM | POA: Insufficient documentation

## 2020-01-02 DIAGNOSIS — Z79899 Other long term (current) drug therapy: Secondary | ICD-10-CM | POA: Insufficient documentation

## 2020-01-02 DIAGNOSIS — Z7901 Long term (current) use of anticoagulants: Secondary | ICD-10-CM | POA: Diagnosis not present

## 2020-01-02 DIAGNOSIS — C9212 Chronic myeloid leukemia, BCR/ABL-positive, in relapse: Secondary | ICD-10-CM | POA: Diagnosis not present

## 2020-01-02 DIAGNOSIS — Z950 Presence of cardiac pacemaker: Secondary | ICD-10-CM | POA: Insufficient documentation

## 2020-01-02 LAB — CMP (CANCER CENTER ONLY)
ALT: 13 U/L (ref 0–44)
AST: 18 U/L (ref 15–41)
Albumin: 4.2 g/dL (ref 3.5–5.0)
Alkaline Phosphatase: 76 U/L (ref 38–126)
Anion gap: 8 (ref 5–15)
BUN: 10 mg/dL (ref 8–23)
CO2: 26 mmol/L (ref 22–32)
Calcium: 9.3 mg/dL (ref 8.9–10.3)
Chloride: 107 mmol/L (ref 98–111)
Creatinine: 1.39 mg/dL — ABNORMAL HIGH (ref 0.61–1.24)
GFR, Est AFR Am: 59 mL/min — ABNORMAL LOW (ref >60)
GFR, Estimated: 51 mL/min — ABNORMAL LOW (ref >60)
Glucose, Bld: 97 mg/dL (ref 70–99)
Potassium: 4.7 mmol/L (ref 3.5–5.1)
Sodium: 141 mmol/L (ref 135–145)
Total Bilirubin: 0.3 mg/dL (ref 0.3–1.2)
Total Protein: 6.9 g/dL (ref 6.5–8.1)

## 2020-01-02 LAB — CUP PACEART REMOTE DEVICE CHECK
Battery Remaining Longevity: 79 mo
Battery Voltage: 3.01 V
Brady Statistic AP VP Percent: 0.69 %
Brady Statistic AP VS Percent: 98.75 %
Brady Statistic AS VP Percent: 0.05 %
Brady Statistic AS VS Percent: 0.51 %
Brady Statistic RA Percent Paced: 99.32 %
Brady Statistic RV Percent Paced: 0.78 %
Date Time Interrogation Session: 20210105133253
Implantable Lead Implant Date: 20170420
Implantable Lead Implant Date: 20170420
Implantable Lead Location: 753859
Implantable Lead Location: 753860
Implantable Lead Model: 5076
Implantable Lead Model: 5076
Implantable Pulse Generator Implant Date: 20170420
Lead Channel Impedance Value: 342 Ohm
Lead Channel Impedance Value: 342 Ohm
Lead Channel Impedance Value: 380 Ohm
Lead Channel Impedance Value: 437 Ohm
Lead Channel Pacing Threshold Amplitude: 0.625 V
Lead Channel Pacing Threshold Amplitude: 0.75 V
Lead Channel Pacing Threshold Pulse Width: 0.4 ms
Lead Channel Pacing Threshold Pulse Width: 0.4 ms
Lead Channel Sensing Intrinsic Amplitude: 3 mV
Lead Channel Sensing Intrinsic Amplitude: 3 mV
Lead Channel Sensing Intrinsic Amplitude: 8.75 mV
Lead Channel Sensing Intrinsic Amplitude: 8.75 mV
Lead Channel Setting Pacing Amplitude: 2 V
Lead Channel Setting Pacing Amplitude: 2.5 V
Lead Channel Setting Pacing Pulse Width: 0.4 ms
Lead Channel Setting Sensing Sensitivity: 2 mV

## 2020-01-02 LAB — CBC WITH DIFFERENTIAL (CANCER CENTER ONLY)
Abs Immature Granulocytes: 0.03 10*3/uL (ref 0.00–0.07)
Basophils Absolute: 0.1 10*3/uL (ref 0.0–0.1)
Basophils Relative: 2 %
Eosinophils Absolute: 0.5 10*3/uL (ref 0.0–0.5)
Eosinophils Relative: 5 %
HCT: 34.6 % — ABNORMAL LOW (ref 39.0–52.0)
Hemoglobin: 10.8 g/dL — ABNORMAL LOW (ref 13.0–17.0)
Immature Granulocytes: 0 %
Lymphocytes Relative: 15 %
Lymphs Abs: 1.4 10*3/uL (ref 0.7–4.0)
MCH: 30.3 pg (ref 26.0–34.0)
MCHC: 31.2 g/dL (ref 30.0–36.0)
MCV: 97.2 fL (ref 80.0–100.0)
Monocytes Absolute: 0.8 10*3/uL (ref 0.1–1.0)
Monocytes Relative: 9 %
Neutro Abs: 6.2 10*3/uL (ref 1.7–7.7)
Neutrophils Relative %: 69 %
Platelet Count: 306 10*3/uL (ref 150–400)
RBC: 3.56 MIL/uL — ABNORMAL LOW (ref 4.22–5.81)
RDW: 15.8 % — ABNORMAL HIGH (ref 11.5–15.5)
WBC Count: 9 10*3/uL (ref 4.0–10.5)
nRBC: 0 % (ref 0.0–0.2)

## 2020-01-02 LAB — SAVE SMEAR(SSMR), FOR PROVIDER SLIDE REVIEW

## 2020-01-02 NOTE — Telephone Encounter (Signed)
Spoke with patient to remind of missed remote transmission 

## 2020-01-02 NOTE — Progress Notes (Signed)
Hematology and Oncology Follow Up Visit  Jason [name] [id number] Mar 07, 1946 74 y.o. 01/02/2020   Principle Diagnosis:  Chronic phase CML - MMR Paroxysmal atrial fibrillation - has pacemaker   Current Therapy:   [name] 400 mg by mouth daily -- started on 09/2019 Xarelto 20 mg by mouth daily   Interim History:  Jason [name] is here today for follow-up.  Unfortunately, his CML did recur.  We saw him back in October, the BCR/ABL ratio was 2.1%.  He now is back on [name].  He is on 400 mg a day.  This is a little bit much for him he thinks.  I told him to break the [place] in half and do 200 mg in the morning and 200 mg in the evening.  Thankfully, his heart is doing okay.  He does have a pacemaker in place.  He has had no fever.  He had a very nice Christmas and New Year's.  He is watching what he eats.  Overall, his performance status is ECOG 1.   Medications:  Allergies as of 01/02/2020   No Known Allergies     Medication List       Accurate as of January 02, 2020 10:57 AM. If you have any questions, ask your nurse or doctor.        acetaminophen 325 MG tablet Commonly known as: TYLENOL Take 650 mg by mouth every 6 (six) hours as needed. For headache.   amiodarone 200 MG tablet Commonly known as: PACERONE TAKE 1 TABLET EVERY DAY   amLODipine 5 MG tablet Commonly known as: NORVASC TAKE 1 TABLET (5 MG TOTAL) BY MOUTH DAILY. KEEP OFFICE VISIT   atorvastatin 40 MG tablet Commonly known as: LIPITOR TAKE 1 TABLET EVERY DAY   dicyclomine 20 MG tablet Commonly known as: BENTYL Take 1 tablet (20 mg total) by mouth 2 (two) times daily.   ferrous sulfate 325 (65 FE) MG EC tablet Take 1 tablet (325 mg total) by mouth 2 (two) times daily.   fluticasone 50 MCG/ACT nasal spray Commonly known as: FLONASE USE 2 SPRAYS IN EACH NOSTRIL EVERY DAY   imatinib 400 MG tablet Commonly known as: [name] Take 1 tablet (400 mg total) by mouth daily. Take with meals and large glass of  water.Caution:Chemotherapy   losartan 100 MG tablet Commonly known as: COZAAR TAKE 1 TABLET EVERY DAY   meclizine 25 MG tablet Commonly known as: ANTIVERT TAKE 1 TABLET THREE TIMES DAILY   metoprolol tartrate 50 MG tablet Commonly known as: LOPRESSOR Take 50 mg by mouth daily.   metoprolol tartrate 100 MG tablet Commonly known as: LOPRESSOR TAKE 1 TABLET TWICE DAILY   nitroGLYCERIN 0.4 MG SL tablet Commonly known as: NITROSTAT Place 1 tablet (0.4 mg total) under the tongue every 5 (five) minutes as needed for chest pain.   ofloxacin 0.3 % OTIC solution Commonly known as: Floxin Otic Place 10 drops into both ears daily. For 7 days   omeprazole 40 MG capsule Commonly known as: PRILOSEC TAKE 1 CAPSULE EVERY DAY   ondansetron 4 MG tablet Commonly known as: ZOFRAN TAKE 1 TABLET EVERY 8 HOURS AS NEEDED FOR NAUSEA AND VOMITING   PROBIOTIC FORMULA PO Take 1 capsule by mouth daily.   Xarelto 20 MG Tabs tablet Generic drug: rivaroxaban TAKE 1 TABLET EVERY DAY WITH SUPPER       Allergies: No Known Allergies  Past Medical History, Surgical history, Social history, and Family History were reviewed and updated.  Review of  Systems: Review of Systems  Constitutional: Negative.   HENT: Negative.   Eyes: Negative.   Respiratory: Negative.   Cardiovascular: Negative.   Gastrointestinal: Negative.   Genitourinary: Negative.   Musculoskeletal: Negative.   Skin: Negative.   Neurological: Negative.   Endo/Heme/Allergies: Negative.   Psychiatric/Behavioral: Negative.      Physical Exam:  weight is 197 lb (89.4 kg). His temporal temperature is 97.9 F (36.6 C). His blood pressure is 132/77 and his pulse is 92. His respiration is 19 and oxygen saturation is 95%.   Wt Readings from Last 3 Encounters:  01/02/20 197 lb (89.4 kg)  12/12/19 197 lb 12.8 oz (89.7 kg)  10/22/19 189 lb (85.7 kg)    Physical Exam Vitals reviewed.  HENT:     Head: Normocephalic and  atraumatic.  Eyes:     Pupils: Pupils are equal, round, and reactive to light.  Cardiovascular:     Rate and Rhythm: Normal rate and regular rhythm.     Heart sounds: Normal heart sounds.  Pulmonary:     Effort: Pulmonary effort is normal.     Breath sounds: Normal breath sounds.  Abdominal:     General: Bowel sounds are normal.     Palpations: Abdomen is soft.  Musculoskeletal:        General: No tenderness or deformity. Normal range of motion.     Cervical back: Normal range of motion.  Lymphadenopathy:     Cervical: No cervical adenopathy.  Skin:    General: Skin is warm and dry.     Findings: No erythema or rash.  Neurological:     Mental Status: He is alert and oriented to person, place, and time.  Psychiatric:        Behavior: Behavior normal.        Thought Content: Thought content normal.        Judgment: Judgment normal.      Lab Results  Component Value Date   WBC 9.0 01/02/2020   HGB 10.8 (L) 01/02/2020   HCT 34.6 (L) 01/02/2020   MCV 97.2 01/02/2020   PLT 306 01/02/2020   Lab Results  Component Value Date   IRON 84 05/27/2015   TIBC 359 05/27/2015   UIBC 275 05/27/2015   IRONPCTSAT 23 05/27/2015   Lab Results  Component Value Date   RETICCTPCT 1.2 05/27/2015   RBC 3.56 (L) 01/02/2020   RETICCTABS 48.2 05/27/2015   No results found for: KPAFRELGTCHN, LAMBDASER, KAPLAMBRATIO No results found for: Kandis Cocking, IGMSERUM No results found for: Odetta Pink, SPEI   Chemistry      Component Value Date/Time   NA 141 01/02/2020 1008   NA 141 12/13/2019 0955   NA 143 11/26/2016 1030   K 4.7 01/02/2020 1008   K 3.8 11/26/2016 1030   CL 107 01/02/2020 1008   CL 109 (H) 03/31/2015 1119   CO2 26 01/02/2020 1008   CO2 25 11/26/2016 1030   BUN 10 01/02/2020 1008   BUN 13 12/13/2019 0955   BUN 5.1 (L) 11/26/2016 1030   CREATININE 1.39 (H) 01/02/2020 1008   CREATININE 1.0 11/26/2016 1030        Component Value Date/Time   CALCIUM 9.3 01/02/2020 1008   CALCIUM 9.3 11/26/2016 1030   ALKPHOS 76 01/02/2020 1008   ALKPHOS 106 11/26/2016 1030   AST 18 01/02/2020 1008   AST 16 11/26/2016 1030   ALT 13 01/02/2020 1008   ALT 10 11/26/2016 1030  BILITOT 0.3 01/02/2020 1008   BILITOT 0.53 11/26/2016 1030      Impression and Plan: Jason [name] is a very pleasant 74 yo caucasian male with chronic phase CML.  He was only off [place] for about 4 months before he recurred.  Hopefully, we will be able to get him back into remission with [name].  I think we should be able to.  We will follow him up in 4 more months.  We will see if he is in remission.  If he is, then we will start cutting the the dose of [name] back a little bit.  Volanda Napoleon, MD 1/6/202110:57 AM

## 2020-01-09 ENCOUNTER — Other Ambulatory Visit: Payer: Self-pay | Admitting: Cardiology

## 2020-01-11 LAB — BCR/ABL

## 2020-01-25 ENCOUNTER — Encounter: Payer: Self-pay | Admitting: Hematology & Oncology

## 2020-01-28 ENCOUNTER — Ambulatory Visit (INDEPENDENT_AMBULATORY_CARE_PROVIDER_SITE_OTHER): Payer: Medicare HMO | Admitting: Cardiology

## 2020-01-28 ENCOUNTER — Other Ambulatory Visit: Payer: Self-pay

## 2020-01-28 ENCOUNTER — Encounter: Payer: Self-pay | Admitting: Cardiology

## 2020-01-28 VITALS — BP 104/62 | HR 82 | Ht 68.0 in | Wt 195.1 lb

## 2020-01-28 DIAGNOSIS — I495 Sick sinus syndrome: Secondary | ICD-10-CM

## 2020-01-28 DIAGNOSIS — I480 Paroxysmal atrial fibrillation: Secondary | ICD-10-CM

## 2020-01-28 DIAGNOSIS — Z950 Presence of cardiac pacemaker: Secondary | ICD-10-CM

## 2020-01-28 LAB — CUP PACEART INCLINIC DEVICE CHECK
Battery Remaining Longevity: 79 mo
Battery Voltage: 3.01 V
Brady Statistic AP VP Percent: 0.81 %
Brady Statistic AP VS Percent: 97.09 %
Brady Statistic AS VP Percent: 0.88 %
Brady Statistic AS VS Percent: 1.22 %
Brady Statistic RA Percent Paced: 97.38 %
Brady Statistic RV Percent Paced: 1.72 %
Date Time Interrogation Session: 20210201165627
Implantable Lead Implant Date: 20170420
Implantable Lead Implant Date: 20170420
Implantable Lead Location: 753859
Implantable Lead Location: 753860
Implantable Lead Model: 5076
Implantable Lead Model: 5076
Implantable Pulse Generator Implant Date: 20170420
Lead Channel Impedance Value: 361 Ohm
Lead Channel Impedance Value: 361 Ohm
Lead Channel Impedance Value: 399 Ohm
Lead Channel Impedance Value: 456 Ohm
Lead Channel Pacing Threshold Amplitude: 0.75 V
Lead Channel Pacing Threshold Amplitude: 0.75 V
Lead Channel Pacing Threshold Pulse Width: 0.4 ms
Lead Channel Pacing Threshold Pulse Width: 0.4 ms
Lead Channel Sensing Intrinsic Amplitude: 12 mV
Lead Channel Sensing Intrinsic Amplitude: 2.25 mV
Lead Channel Setting Pacing Amplitude: 2 V
Lead Channel Setting Pacing Amplitude: 2.5 V
Lead Channel Setting Pacing Pulse Width: 0.4 ms
Lead Channel Setting Sensing Sensitivity: 2 mV

## 2020-01-28 MED ORDER — AMIODARONE HCL 200 MG PO TABS: 100.0000 mg | ORAL_TABLET | Freq: Every day | ORAL | 2 refills | Status: DC

## 2020-01-28 NOTE — Patient Instructions (Addendum)
Medication Instructions:  Your physician has recommended you make the following change in your medication:  1. DECREASE Amiodarone to 100 mg once daily  *If you need a refill on your cardiac medications before your next appointment, please call your pharmacy*  Labwork: None ordered  Testing/Procedures: None ordered  Follow-Up: Remote monitoring is used to monitor your Pacemaker or ICD from home. This monitoring reduces the number of office visits required to check your device to one time per year. It allows [place] to keep an eye on the functioning of your device to ensure it is working properly. You are scheduled for a device check from home on 03/31/2020. You may send your transmission at any time that day. If you have a wireless device, the transmission will be sent automatically. After your physician reviews your transmission, you will receive a postcard with your next transmission date.  Your physician wants you to follow-up in: 6 months with Dr. Curt Bears.  You will receive a reminder letter in the mail two months in advance. If you don't receive a letter, please call our office to schedule the follow-up appointment.   Thank you for choosing [facility]!!   Trinidad Curet, RN [phone]  Any Other Special Instructions Will Be Listed Below (If Applicable).  Discuss your elevated thyroid result with your primary docotor

## 2020-01-28 NOTE — Progress Notes (Signed)
Electrophysiology Office Note   Date:  01/28/2020   ID:  Jason [name], DOB 12-18-1946, MRN [id number]  PCP:  Mosie Lukes, MD  Cardiologist:  Dennard Nip Primary Electrophysiologist:  Constance Haw, MD    No chief complaint on file.    History of Present Illness: Jason [name] is a 74 y.o. male who is being seen today for the evaluation of atrial fibrillation at the request of Cren[facility]. Presenting today for electrophysiology evaluation.  He has a history of coronary artery disease, paroxysmal atrial fibrillation, tachybradycardia syndrome status post pacemaker, CLL, CVA, hypertension, hyperlipidemia, AAA.  He had a Medtronic dual-chamber pacemaker implanted 04/16/16.  Today, denies symptoms of palpitations, chest pain, shortness of breath, orthopnea, PND, lower extremity edema, claudication, dizziness, presyncope, syncope, bleeding, or neurologic sequela. The patient is tolerating medications without difficulties.  Overall he is doing well.  He has noted no further episodes of atrial fibrillation.  He is able to do all of his activities without restriction.  Past Medical History:  Diagnosis Date  . Anemia 05/08/2015  . CML (chronic myelocytic leukemia) ([place]) 12/16/2011  . Coronary heart disease   . CVA (cerebrovascular accident) ([place])   . GERD (gastroesophageal reflux disease)   . Grief reaction 03/18/2014  . H/O tobacco use, presenting hazards to health 02/24/2012   No cigarettes since pacemaker placement. Encouraged ongoing efforts   . History of chicken pox   . History of kidney stones   . Hyperlipidemia   . Hypertension   . IBS (irritable bowel syndrome) 09/20/2013  . Medicare annual wellness visit, subsequent 03/23/2014  . PAF (paroxysmal atrial fibrillation) ([place])   . Symptomatic bradycardia    a. symptomatic bradycardia due to sinus node dysfunction s/p RA and RV Medtronic pacemaker 04/15/16   Past Surgical History:  Procedure Laterality Date  .  CATARACT EXTRACTION    . CHOLECYSTECTOMY  2003  . CORONARY ANGIOPLASTY WITH STENT PLACEMENT  2000  . EP IMPLANTABLE DEVICE N/A 04/15/2016   Procedure: Pacemaker Implant;  Surgeon: Evans Lance, MD; Medtronic (serial number [id number]) pacemaker; Laterality: Left     Current Outpatient Medications  Medication Sig Dispense Refill  . acetaminophen (TYLENOL) 325 MG tablet Take 650 mg by mouth every 6 (six) hours as needed. For headache.    Marland Kitchen amLODipine (NORVASC) 5 MG tablet TAKE 1 TABLET (5 MG TOTAL) BY MOUTH DAILY. KEEP OFFICE VISIT 90 tablet 3  . atorvastatin (LIPITOR) 40 MG tablet Take 1 tablet (40 mg total) by mouth daily. 90 tablet 2  . dicyclomine (BENTYL) 20 MG tablet Take 1 tablet (20 mg total) by mouth 2 (two) times daily. 20 tablet 0  . ferrous sulfate 325 (65 FE) MG EC tablet Take 1 tablet (325 mg total) by mouth 2 (two) times daily. 60 tablet 11  . fluticasone (FLONASE) 50 MCG/ACT nasal spray USE 2 SPRAYS IN EACH NOSTRIL EVERY DAY 48 g 3  . imatinib ([name]) 400 MG tablet Take 1 tablet (400 mg total) by mouth daily. Take with meals and large glass of water.Caution:Chemotherapy 90 tablet 3  . losartan (COZAAR) 100 MG tablet TAKE 1 TABLET EVERY DAY 90 tablet 0  . meclizine (ANTIVERT) 25 MG tablet TAKE 1 TABLET THREE TIMES DAILY 90 tablet 0  . metoprolol tartrate (LOPRESSOR) 100 MG tablet Take 100 mg by mouth daily.    . nitroGLYCERIN (NITROSTAT) 0.4 MG SL tablet Place 1 tablet (0.4 mg total) under the tongue every 5 (five) minutes as needed for chest  pain. 25 tablet 1  . ofloxacin (FLOXIN OTIC) 0.3 % OTIC solution Place 10 drops into both ears daily. For 7 days 10 mL 0  . omeprazole (PRILOSEC) 40 MG capsule TAKE 1 CAPSULE EVERY DAY 90 capsule 3  . ondansetron (ZOFRAN) 4 MG tablet TAKE 1 TABLET EVERY 8 HOURS AS NEEDED FOR NAUSEA AND VOMITING 40 tablet 1  . Probiotic Product (PROBIOTIC FORMULA PO) Take 1 capsule by mouth daily.     [name] 20 MG TABS tablet TAKE 1 TABLET EVERY DAY  WITH SUPPER 90 tablet 2  . amiodarone (PACERONE) 200 MG tablet Take 0.5 tablets (100 mg total) by mouth daily. 45 tablet 2   No current facility-administered medications for this visit.    Allergies:   Patient has no known allergies.   Social History:  The patient  reports that he quit smoking about 3 years ago. His smoking use included cigarettes. He started smoking about 52 years ago. He has a 10.00 pack-year smoking history. He has never used smokeless tobacco. He reports that he does not drink alcohol or use drugs.   Family History:  The patient's family history includes Alcohol abuse in his father; Arthritis in his mother; Cancer in his mother; Diabetes in his son; Heart disease in his mother; Hypertension in his father and mother; Other in his mother; Rheumatic fever in his mother.    ROS:  Please see the history of present illness.   Otherwise, review of systems is positive for none.   All other systems are reviewed and negative.   PHYSICAL EXAM: VS:  BP 104/62   Pulse 82   Ht 5\' 8"  (1.727 m)   Wt 195 lb 1.3 oz (88.5 kg)   SpO2 95%   BMI 29.66 kg/m  , BMI Body mass index is 29.66 kg/m. GEN: Well nourished, well developed, in no acute distress  HEENT: normal  Neck: no JVD, carotid bruits, or masses Cardiac: RRR; no murmurs, rubs, or gallops,no edema  Respiratory:  clear to auscultation bilaterally, normal work of breathing GI: soft, nontender, nondistended, + BS MS: no deformity or atrophy  Skin: warm and dry, device site well healed Neuro:  Strength and sensation are intact Psych: euthymic mood, full affect  EKG:  EKG is not ordered today. Personal review of the ekg ordered 12/12/19 shows A paced  Personal review of the device interrogation today. Results in [place]: 12/12/2019: TSH 8.230 01/02/2020: ALT 13; BUN 10; Creatinine 1.39; Hemoglobin 10.8; Platelet Count 306; Potassium 4.7; Sodium 141    Lipid Panel     Component Value Date/Time   CHOL  105 09/22/2018 1056   TRIG 88.0 09/22/2018 1056   HDL 31.80 (L) 09/22/2018 1056   CHOLHDL 3 09/22/2018 1056   VLDL 17.6 09/22/2018 1056   LDLCALC 55 09/22/2018 1056     Wt Readings from Last 3 Encounters:  01/28/20 195 lb 1.3 oz (88.5 kg)  01/02/20 197 lb (89.4 kg)  12/12/19 197 lb 12.8 oz (89.7 kg)      Other studies Reviewed: Additional studies/ records that were reviewed today include: TTE 04/16/16  Review of the above records today demonstrates:  - Left ventricle: The cavity size was normal. Wall thickness was   normal. Systolic function was mildly to moderately reduced. The   estimated ejection fraction was in the range of 40% to 45%.   Diffuse hypokinesis. Doppler parameters are consistent with   abnormal left ventricular relaxation (grade 1 diastolic  dysfunction). - Aortic valve: Trileaflet; mildly calcified leaflets. There was   mild to moderate regurgitation. - Aorta: Mildly dilated aortic root. Dilated ascending aorta.   Ascending aorta: 44 mm. Aortic root dimension: 37 mm (ED). - Mitral valve: There was no significant regurgitation. - Right ventricle: The cavity size was normal. Pacer wire or   catheter noted in right ventricle. Systolic function was normal. - Pulmonary arteries: No complete TR doppler jet so unable to   estimate PA systolic pressure. - Inferior vena cava: The vessel was normal in size. The   respirophasic diameter changes were in the normal range (>= 50%),   consistent with normal central venous pressure.   ASSESSMENT AND PLAN:  1.  Tachybradycardia syndrome: Status post Medtronic dual-chamber pacemaker implanted in 2017.  Device functioning appropriately.  No changes.  2.  Paroxysmal atrial fibrillation: Currently on Xarelto and low-dose amiodarone.  CHA2DS2-VASc of 5.  We Kairee Isa decrease amiodarone to 100 mg.  3.  Coronary artery disease: On aspirin, beta-blocker, statin.  No current chest pain.  No changes.  4.  Hypertension: Currently  well controlled  The patient does not have concerns regarding his medicines.  The following changes were made today: Decrease amiodarone  Labs/ tests ordered today include:  No orders of the defined types were placed in this encounter.    Disposition:   FU with Deklan Minar 6 months  Signed, Zhanna Melin Meredith Leeds, MD  01/28/2020 3:59 PM     [place] [address] [phone] (office) [phone] (fax)

## 2020-02-01 ENCOUNTER — Other Ambulatory Visit: Payer: Self-pay | Admitting: Family Medicine

## 2020-02-11 ENCOUNTER — Other Ambulatory Visit: Payer: Self-pay | Admitting: Cardiology

## 2020-02-11 ENCOUNTER — Other Ambulatory Visit: Payer: Self-pay | Admitting: Family Medicine

## 2020-02-12 NOTE — Telephone Encounter (Signed)
This is a [facility] pt 

## 2020-02-14 ENCOUNTER — Other Ambulatory Visit: Payer: Self-pay | Admitting: Cardiology

## 2020-02-19 NOTE — Telephone Encounter (Signed)
Attempted to reach pt to scheduled OV. Left detailed message to check mychart message from 02/04/20.

## 2020-02-25 ENCOUNTER — Ambulatory Visit: Payer: Medicare HMO | Admitting: Hematology & Oncology

## 2020-02-25 ENCOUNTER — Other Ambulatory Visit: Payer: Medicare HMO

## 2020-03-03 NOTE — Progress Notes (Signed)
Nurse connected with patient 03/04/20 at  9:30 AM EST by a telephone enabled telemedicine application and verified that I am speaking with the correct person using two identifiers. Patient stated full name and DOB. Patient gave permission to continue with virtual visit. Patient's location was at home and Nurse's location was at [place] office.   Subjective:   Jason [name] is a 74 y.o. male who presents for Medicare Annual/Subsequent preventive examination.  Enjoys Proofreader.  Review of Systems:  Home Safety/Smoke Alarms: Feels safe in home. Smoke alarms in place.  Lives in 1 story home w/ basement. Grandson lives w/ him.   Male:   CCS- 09/02/15.    Recall 3 yrs. Declines today. PSA-  Lab Results  Component Value Date   PSA 0.98 09/22/2018       Objective:    Vitals: BP 119/73 Comment: pt reported    Advanced Directives 03/04/2020 01/02/2020 10/22/2019 10/04/2019 06/20/2019 05/25/2019 12/19/2018  Does Patient Have a Medical Advance Directive? No No No No No No No  Would patient like information on creating a medical advance directive? No - Patient declined No - Patient declined No - Patient declined No - Patient declined No - Patient declined - -    Tobacco Social History   Tobacco Use  Smoking Status Former Smoker  . Packs/day: 0.25  . Years: 40.00  . Pack years: 10.00  . Types: Cigarettes  . Start date: 10/30/1967  . Quit date: 04/14/2016  . Years since quitting: 3.8  Smokeless Tobacco Never Used  Tobacco Comment   04-16 quit smoking     Counseling given: Not Answered Comment: 04-16 quit smoking   Clinical Intake: Pain : No/denies pain     Past Medical History:  Diagnosis Date  . Anemia 05/08/2015  . CML (chronic myelocytic leukemia) ([place]) 12/16/2011  . Coronary heart disease   . CVA (cerebrovascular accident) ([place])   . GERD (gastroesophageal reflux disease)   . Grief reaction 03/18/2014  . H/O tobacco use, presenting hazards to health 02/24/2012   No  cigarettes since pacemaker placement. Encouraged ongoing efforts   . History of chicken pox   . History of kidney stones   . Hyperlipidemia   . Hypertension   . IBS (irritable bowel syndrome) 09/20/2013  . Medicare annual wellness visit, subsequent 03/23/2014  . PAF (paroxysmal atrial fibrillation) ([place])   . Symptomatic bradycardia    a. symptomatic bradycardia due to sinus node dysfunction s/p RA and RV Medtronic pacemaker 04/15/16   Past Surgical History:  Procedure Laterality Date  . CATARACT EXTRACTION    . CHOLECYSTECTOMY  2003  . CORONARY ANGIOPLASTY WITH STENT PLACEMENT  2000  . EP IMPLANTABLE DEVICE N/A 04/15/2016   Procedure: Pacemaker Implant;  Surgeon: Evans Lance, MD; Medtronic (serial number [id number]) pacemaker; Laterality: Left   Family History  Problem Relation Age of Onset  . Arthritis Mother   . Hypertension Mother   . Heart disease Mother        Rheumatic fever  . Rheumatic fever Mother   . Other Mother        brain tumor  . Cancer Mother        leukemia  . Hypertension Father   . Alcohol abuse Father   . Diabetes Son   . Colon cancer Neg Hx   . Breast cancer Neg Hx   . Prostate cancer Neg Hx    Social History   Socioeconomic History  . Marital status: Widowed  Spouse name: Not on file  . Number of children: 3  . Years of education: Not on file  . Highest education level: Not on file  Occupational History  . Occupation: Retired    Comment: Retired  Immunologist  . Smoking status: Former Smoker    Packs/day: 0.25    Years: 40.00    Pack years: 10.00    Types: Cigarettes    Start date: 10/30/1967    Quit date: 04/14/2016    Years since quitting: 3.8  . Smokeless tobacco: Never Used  . Tobacco comment: 04-16 quit smoking  Substance and Sexual Activity  . Alcohol use: No    Alcohol/week: 0.0 standard drinks    Comment: Rare  . Drug use: No  . Sexual activity: Never    Comment: wife died in February 06, 2023 married 31 years, grandson livew with  patient  Other Topics Concern  . Not on file  Social History Narrative  . Not on file   Social Determinants of Health   Financial Resource Strain:   . Difficulty of Paying Living Expenses: Not on file  Food Insecurity:   . Worried About Charity fundraiser in the Last Year: Not on file  . Ran Out of Food in the Last Year: Not on file  Transportation Needs:   . Lack of Transportation (Medical): Not on file  . Lack of Transportation (Non-Medical): Not on file  Physical Activity:   . Days of Exercise per Week: Not on file  . Minutes of Exercise per Session: Not on file  Stress:   . Feeling of Stress : Not on file  Social Connections:   . Frequency of Communication with Friends and Family: Not on file  . Frequency of Social Gatherings with Friends and Family: Not on file  . Attends Religious Services: Not on file  . Active Member of Clubs or Organizations: Not on file  . Attends Archivist Meetings: Not on file  . Marital Status: Not on file    Outpatient Encounter Medications as of 03/04/2020  Medication Sig  . acetaminophen (TYLENOL) 325 MG tablet Take 650 mg by mouth every 6 (six) hours as needed. For headache.  Marland Kitchen amiodarone (PACERONE) 200 MG tablet Take half a tablet by mouth daily  . amLODipine (NORVASC) 5 MG tablet TAKE 1 TABLET (5 MG TOTAL) BY MOUTH DAILY. KEEP OFFICE VISIT  . atorvastatin (LIPITOR) 40 MG tablet Take 1 tablet (40 mg total) by mouth daily.  Marland Kitchen dicyclomine (BENTYL) 20 MG tablet Take 1 tablet (20 mg total) by mouth 2 (two) times daily.  . ferrous sulfate 325 (65 FE) MG EC tablet Take 1 tablet (325 mg total) by mouth 2 (two) times daily.  . fluticasone (FLONASE) 50 MCG/ACT nasal spray USE 2 SPRAYS IN EACH NOSTRIL EVERY DAY  . imatinib ([name]) 400 MG tablet Take 1 tablet (400 mg total) by mouth daily. Take with meals and large glass of water.Caution:Chemotherapy  . losartan (COZAAR) 100 MG tablet TAKE 1 TABLET EVERY DAY  . meclizine (ANTIVERT) 25 MG  tablet TAKE 1 TABLET THREE TIMES DAILY  . metoprolol tartrate (LOPRESSOR) 100 MG tablet TAKE 1 TABLET TWICE DAILY  . ofloxacin (FLOXIN OTIC) 0.3 % OTIC solution Place 10 drops into both ears daily. For 7 days  . omeprazole (PRILOSEC) 40 MG capsule TAKE 1 CAPSULE EVERY DAY  . ondansetron (ZOFRAN) 4 MG tablet TAKE 1 TABLET EVERY 8 HOURS AS NEEDED FOR NAUSEA AND VOMITING  . Probiotic Product (PROBIOTIC  FORMULA PO) Take 1 capsule by mouth daily.   [name] 20 MG TABS tablet TAKE 1 TABLET EVERY DAY WITH SUPPER  . nitroGLYCERIN (NITROSTAT) 0.4 MG SL tablet Place 1 tablet (0.4 mg total) under the tongue every 5 (five) minutes as needed for chest pain. (Patient not taking: Reported on 03/04/2020)   No facility-administered encounter medications on file as of 03/04/2020.    Activities of Daily Living In your present state of health, do you have any difficulty performing the following activities: 03/04/2020 10/04/2019  Hearing? N N  Vision? N N  Difficulty concentrating or making decisions? N N  Walking or climbing stairs? N N  Dressing or bathing? N N  Doing errands, shopping? N N  Preparing Food and eating ? N N  Using the Toilet? N N  In the past six months, have you accidently leaked urine? N N  Do you have problems with loss of bowel control? N N  Managing your Medications? N N  Managing your Finances? N N  Housekeeping or managing your Housekeeping? N N  Some recent data might be hidden    Patient Care Team: Mosie Lukes, MD as PCP - General (Family Medicine) Constance Haw, MD as PCP - Electrophysiology (Cardiology) Constance Haw, MD as Consulting Physician (Cardiology)   Assessment:   This is a routine wellness examination for Sylas. Physical assessment deferred to PCP.  Exercise Activities and Dietary recommendations Current Exercise Habits: The patient does not participate in regular exercise at present, Exercise limited by: None identified   Diet (meal preparation,  eat out, water intake, caffeinated beverages, dairy products, fruits and vegetables): well balanced   Goals    . DIET - INCREASE WATER INTAKE       Fall Risk Fall Risk  03/04/2020 10/04/2019 09/22/2018 09/15/2017 11/26/2016  Falls in the past year? 0 0 No No No  Number falls in past yr: 0 0 - - -  Injury with Fall? 0 0 - - -  Follow up Education provided;Falls prevention discussed - - - -     Depression Screen PHQ 2/9 Scores 03/04/2020 10/04/2019 09/22/2018 09/15/2017  PHQ - 2 Score 0 0 0 0    Cognitive Function Ad8 score reviewed for issues:  Issues making decisions:no  Less interest in hobbies / activities:no  Repeats questions, stories (family complaining):no  Trouble using ordinary gadgets (microwave, computer, phone):no  Forgets the month or year: no  Mismanaging finances: no  Remembering appts:no  Daily problems with thinking and/or memory:no Ad8 score is=0    MMSE - Mini Mental State Exam 10/04/2019  Not completed: Refused        Immunization History  Administered Date(s) Administered  . Pneumococcal Conjugate-13 11/07/2015  . Pneumococcal Polysaccharide-23 09/15/2017  . Tdap 11/07/2015    Screening Tests Health Maintenance  Topic Date Due  . COLONOSCOPY  09/01/2018  . INFLUENZA VACCINE  07/28/2019  . TETANUS/TDAP  11/06/2025  . Hepatitis C Screening  Completed  . PNA vac Low Risk Adult  Completed       Plan:    Please schedule your next medicare wellness visit with me in 1 yr.  Continue to eat heart healthy diet (full of fruits, vegetables, whole grains, lean protein, water--limit salt, fat, and sugar intake) and increase physical activity as tolerated.  Continue doing brain stimulating activities (puzzles, reading, adult coloring books, staying active) to keep memory sharp.   Bring a copy of your living will and/or healthcare power of attorney  to your next office visit.   I have personally reviewed and noted the following in the patient's  chart:   . Medical and social history . Use of alcohol, tobacco or illicit drugs  . Current medications and supplements . Functional ability and status . Nutritional status . Physical activity . Advanced directives . List of other physicians . Hospitalizations, surgeries, and ER visits in previous 12 months . Vitals . Screenings to include cognitive, depression, and falls . Referrals and appointments  In addition, I have reviewed and discussed with patient certain preventive protocols, quality metrics, and best practice recommendations. A written personalized care plan for preventive services as well as general preventive health recommendations were provided to patient.     Shela Nevin, [place]  03/04/2020

## 2020-03-04 ENCOUNTER — Other Ambulatory Visit: Payer: Self-pay

## 2020-03-04 ENCOUNTER — Encounter: Payer: Self-pay | Admitting: *Deleted

## 2020-03-04 ENCOUNTER — Ambulatory Visit (INDEPENDENT_AMBULATORY_CARE_PROVIDER_SITE_OTHER): Payer: Medicare HMO | Admitting: *Deleted

## 2020-03-04 VITALS — BP 119/73

## 2020-03-04 DIAGNOSIS — Z0000 Encounter for general adult medical examination without abnormal findings: Secondary | ICD-10-CM | POA: Diagnosis not present

## 2020-03-04 NOTE — Patient Instructions (Signed)
Please schedule your next medicare wellness visit with me in 1 yr.  Continue to eat heart healthy diet (full of fruits, vegetables, whole grains, lean protein, water--limit salt, fat, and sugar intake) and increase physical activity as tolerated.  Continue doing brain stimulating activities (puzzles, reading, adult coloring books, staying active) to keep memory sharp.   Bring a copy of your living will and/or healthcare power of attorney to your next office visit.   Jason [name] , Thank you for taking time to come for your Medicare Wellness Visit. I appreciate your ongoing commitment to your health goals. Please review the following plan we discussed and let me know if I can assist you in the future.   These are the goals we discussed: Goals    . DIET - INCREASE WATER INTAKE       This is a list of the screening recommended for you and due dates:  Health Maintenance  Topic Date Due  . Colon Cancer Screening  09/01/2018  . Flu Shot  07/28/2019  . Tetanus Vaccine  11/06/2025  .  Hepatitis C: One time screening is recommended by Center for Disease Control  (CDC) for  adults born from [birth year] through 1963.   Completed  . Pneumonia vaccines  Completed    Preventive Care 12 Years and Older, Male Preventive care refers to lifestyle choices and visits with your health care provider that can promote health and wellness. This includes:  A yearly physical exam. This is also called an annual well check.  Regular dental and eye exams.  Immunizations.  Screening for certain conditions.  Healthy lifestyle choices, such as diet and exercise. What can I expect for my preventive care visit? Physical exam Your health care provider will check:  Height and weight. These may be used to calculate body mass index (BMI), which is a measurement that tells if you are at a healthy weight.  Heart rate and blood pressure.  Your skin for abnormal spots. Counseling Your health care provider may ask you  questions about:  Alcohol, tobacco, and drug use.  Emotional well-being.  Home and relationship well-being.  Sexual activity.  Eating habits.  History of falls.  Memory and ability to understand (cognition).  Work and work Statistician. What immunizations do I need?  Influenza (flu) vaccine  This is recommended every year. Tetanus, diphtheria, and pertussis (Tdap) vaccine  You may need a Td booster every 10 years. Varicella (chickenpox) vaccine  You may need this vaccine if you have not already been vaccinated. Zoster (shingles) vaccine  You may need this after age 71. Pneumococcal conjugate (PCV13) vaccine  One dose is recommended after age 55. Pneumococcal polysaccharide (PPSV23) vaccine  One dose is recommended after age 4. Measles, mumps, and rubella (MMR) vaccine  You may need at least one dose of MMR if you were born in 1957 or later. You may also need a second dose. Meningococcal conjugate (MenACWY) vaccine  You may need this if you have certain conditions. Hepatitis A vaccine  You may need this if you have certain conditions or if you travel or work in places where you may be exposed to hepatitis A. Hepatitis B vaccine  You may need this if you have certain conditions or if you travel or work in places where you may be exposed to hepatitis B. Haemophilus influenzae type b (Hib) vaccine  You may need this if you have certain conditions. You may receive vaccines as individual doses or as more than one vaccine  together in one shot (combination vaccines). Talk with your health care provider about the risks and benefits of combination vaccines. What tests do I need? Blood tests  Lipid and cholesterol levels. These may be checked every 5 years, or more frequently depending on your overall health.  Hepatitis C test.  Hepatitis B test. Screening  Lung cancer screening. You may have this screening every year starting at age 43 if you have a 30-pack-year  history of smoking and currently smoke or have quit within the past 15 years.  Colorectal cancer screening. All adults should have this screening starting at age 34 and continuing until age 51. Your health care provider may recommend screening at age 55 if you are at increased risk. You will have tests every 1-10 years, depending on your results and the type of screening test.  Prostate cancer screening. Recommendations will vary depending on your family history and other risks.  Diabetes screening. This is done by checking your blood sugar (glucose) after you have not eaten for a while (fasting). You may have this done every 1-3 years.  Abdominal aortic aneurysm (AAA) screening. You may need this if you are a current or former smoker.  Sexually transmitted disease (STD) testing. Follow these instructions at home: Eating and drinking  Eat a diet that includes fresh fruits and vegetables, whole grains, lean protein, and low-fat dairy products. Limit your intake of foods with high amounts of sugar, saturated fats, and salt.  Take vitamin and mineral supplements as recommended by your health care provider.  Do not drink alcohol if your health care provider tells you not to drink.  If you drink alcohol: ? Limit how much you have to 0-2 drinks a day. ? Be aware of how much alcohol is in your drink. In the [place] one drink equals one 12 oz bottle of beer (355 mL), one 5 oz glass of wine (148 mL), or one 1 oz glass of hard liquor (44 mL). Lifestyle  Take daily care of your teeth and gums.  Stay active. Exercise for at least 30 minutes on 5 or more days each week.  Do not use any products that contain nicotine or tobacco, such as cigarettes, e-cigarettes, and chewing tobacco. If you need help quitting, ask your health care provider.  If you are sexually active, practice safe sex. Use a condom or other form of protection to prevent STIs (sexually transmitted infections).  Talk with your  health care provider about taking a low-dose aspirin or statin. What's next?  Visit your health care provider once a year for a well check visit.  Ask your health care provider how often you should have your eyes and teeth checked.  Stay up to date on all vaccines. This information is not intended to replace advice given to you by your health care provider. Make sure you discuss any questions you have with your health care provider. Document Revised: 12/07/2018 Document Reviewed: 12/07/2018 Elsevier Patient Education  2020 [employer].

## 2020-03-06 ENCOUNTER — Other Ambulatory Visit: Payer: Self-pay

## 2020-03-06 ENCOUNTER — Other Ambulatory Visit: Payer: Self-pay | Admitting: Emergency Medicine

## 2020-03-06 ENCOUNTER — Other Ambulatory Visit: Payer: Medicare HMO

## 2020-03-06 ENCOUNTER — Ambulatory Visit (INDEPENDENT_AMBULATORY_CARE_PROVIDER_SITE_OTHER): Payer: Medicare HMO | Admitting: Family Medicine

## 2020-03-06 ENCOUNTER — Encounter: Payer: Self-pay | Admitting: Family Medicine

## 2020-03-06 VITALS — BP 135/70 | HR 81 | Temp 96.1°F | Resp 16 | Ht 68.0 in | Wt 196.0 lb

## 2020-03-06 DIAGNOSIS — C9210 Chronic myeloid leukemia, BCR/ABL-positive, not having achieved remission: Secondary | ICD-10-CM

## 2020-03-06 DIAGNOSIS — E538 Deficiency of other specified B group vitamins: Secondary | ICD-10-CM

## 2020-03-06 DIAGNOSIS — D539 Nutritional anemia, unspecified: Secondary | ICD-10-CM

## 2020-03-06 DIAGNOSIS — I2510 Atherosclerotic heart disease of native coronary artery without angina pectoris: Secondary | ICD-10-CM

## 2020-03-06 DIAGNOSIS — I10 Essential (primary) hypertension: Secondary | ICD-10-CM

## 2020-03-06 DIAGNOSIS — N289 Disorder of kidney and ureter, unspecified: Secondary | ICD-10-CM

## 2020-03-06 DIAGNOSIS — L578 Other skin changes due to chronic exposure to nonionizing radiation: Secondary | ICD-10-CM

## 2020-03-06 DIAGNOSIS — E782 Mixed hyperlipidemia: Secondary | ICD-10-CM

## 2020-03-06 DIAGNOSIS — D649 Anemia, unspecified: Secondary | ICD-10-CM | POA: Diagnosis not present

## 2020-03-06 LAB — CBC
HCT: 34.3 % — ABNORMAL LOW (ref 39.0–52.0)
Hemoglobin: 11.1 g/dL — ABNORMAL LOW (ref 13.0–17.0)
MCHC: 32.4 g/dL (ref 30.0–36.0)
MCV: 94.2 fl (ref 78.0–100.0)
Platelets: 302 10*3/uL (ref 150.0–400.0)
RBC: 3.64 Mil/uL — ABNORMAL LOW (ref 4.22–5.81)
RDW: 15.3 % (ref 11.5–15.5)
WBC: 7.9 10*3/uL (ref 4.0–10.5)

## 2020-03-06 LAB — COMPREHENSIVE METABOLIC PANEL
ALT: 9 U/L (ref 0–53)
AST: 15 U/L (ref 0–37)
Albumin: 3.8 g/dL (ref 3.5–5.2)
Alkaline Phosphatase: 83 U/L (ref 39–117)
BUN: 8 mg/dL (ref 6–23)
CO2: 26 mEq/L (ref 19–32)
Calcium: 8.9 mg/dL (ref 8.4–10.5)
Chloride: 108 mEq/L (ref 96–112)
Creatinine, Ser: 1.08 mg/dL (ref 0.40–1.50)
GFR: 67.21 mL/min (ref >60.00)
Glucose, Bld: 87 mg/dL (ref 70–99)
Potassium: 4 mEq/L (ref 3.5–5.1)
Sodium: 142 mEq/L (ref 135–145)
Total Bilirubin: 0.4 mg/dL (ref 0.2–1.2)
Total Protein: 6.7 g/dL (ref 6.0–8.3)

## 2020-03-06 LAB — LIPID PANEL
Cholesterol: 113 mg/dL (ref 0–200)
HDL: 42.9 mg/dL (ref >39.00)
LDL Cholesterol: 56 mg/dL (ref 0–99)
NonHDL: 70.05
Total CHOL/HDL Ratio: 3
Triglycerides: 71 mg/dL (ref 0.0–149.0)
VLDL: 14.2 mg/dL (ref 0.0–40.0)

## 2020-03-06 LAB — VITAMIN B12: Vitamin B-12: 150 pg/mL — ABNORMAL LOW (ref 211–911)

## 2020-03-06 MED ORDER — NITROGLYCERIN 0.4 MG SL SUBL: 0.4000 mg | SUBLINGUAL_TABLET | SUBLINGUAL | 1 refills | Status: DC | PRN

## 2020-03-06 NOTE — Assessment & Plan Note (Signed)
Tolerating [name] 200 mg bid and sees hematology again in May

## 2020-03-06 NOTE — Progress Notes (Signed)
Subjective:    Patient ID: Jason [name], male    DOB: 28-Jan-1946, 74 y.o.   MRN: [id number]  Chief Complaint  Patient presents with  . Hypertension    Here for follow up    HPI Patient is in today for follow up on chronic medical concerns. He is doing well today. No recent febrile illness or hospitalizations. He has had 2 falls in the past year but no syncope. He just lost his balance. He also had one less than 5 minutes of diaphoresis and has discussed it with cardiology. Denies CP/palp/SOB/HA/congestion/fevers/GI or GU c/o. Taking meds as prescribed  Past Medical History:  Diagnosis Date  . Anemia 05/08/2015  . CML (chronic myelocytic leukemia) ([place]) 12/16/2011  . Coronary heart disease   . CVA (cerebrovascular accident) ([place])   . GERD (gastroesophageal reflux disease)   . Grief reaction 03/18/2014  . H/O tobacco use, presenting hazards to health 02/24/2012   No cigarettes since pacemaker placement. Encouraged ongoing efforts   . History of chicken pox   . History of kidney stones   . Hyperlipidemia   . Hypertension   . IBS (irritable bowel syndrome) 09/20/2013  . Medicare annual wellness visit, subsequent 03/23/2014  . PAF (paroxysmal atrial fibrillation) ([place])   . Symptomatic bradycardia    a. symptomatic bradycardia due to sinus node dysfunction s/p RA and RV Medtronic pacemaker 04/15/16    Past Surgical History:  Procedure Laterality Date  . CATARACT EXTRACTION    . CHOLECYSTECTOMY  2003  . CORONARY ANGIOPLASTY WITH STENT PLACEMENT  2000  . EP IMPLANTABLE DEVICE N/A 04/15/2016   Procedure: Pacemaker Implant;  Surgeon: Evans Lance, MD; Medtronic (serial number [id number]) pacemaker; Laterality: Left    Family History  Problem Relation Age of Onset  . Arthritis Mother   . Hypertension Mother   . Heart disease Mother        Rheumatic fever  . Rheumatic fever Mother   . Other Mother        brain tumor  . Cancer Mother        leukemia  . Hypertension Father   .  Alcohol abuse Father   . Diabetes Son   . Colon cancer Neg Hx   . Breast cancer Neg Hx   . Prostate cancer Neg Hx     Social History   Socioeconomic History  . Marital status: Widowed    Spouse name: Not on file  . Number of children: 3  . Years of education: Not on file  . Highest education level: Not on file  Occupational History  . Occupation: Retired    Comment: Retired  Immunologist  . Smoking status: Former Smoker    Packs/day: 0.25    Years: 40.00    Pack years: 10.00    Types: Cigarettes    Start date: 10/30/1967    Quit date: 04/14/2016    Years since quitting: 3.9  . Smokeless tobacco: Never Used  . Tobacco comment: 04-16 quit smoking  Substance and Sexual Activity  . Alcohol use: No    Alcohol/week: 0.0 standard drinks    Comment: Rare  . Drug use: No  . Sexual activity: Never    Comment: wife died in 25-Jan-2023 married 22 years, grandson livew with patient  Other Topics Concern  . Not on file  Social History Narrative  . Not on file   Social Determinants of Health   Financial Resource Strain: Low Risk   . Difficulty of Paying  Living Expenses: Not hard at all  Food Insecurity: No Food Insecurity  . Worried About Charity fundraiser in the Last Year: Never true  . Ran Out of Food in the Last Year: Never true  Transportation Needs: No Transportation Needs  . Lack of Transportation (Medical): No  . Lack of Transportation (Non-Medical): No  Physical Activity:   . Days of Exercise per Week:   . Minutes of Exercise per Session:   Stress:   . Feeling of Stress :   Social Connections:   . Frequency of Communication with Friends and Family:   . Frequency of Social Gatherings with Friends and Family:   . Attends Religious Services:   . Active Member of Clubs or Organizations:   . Attends Archivist Meetings:   Marland Kitchen Marital Status:   Intimate Partner Violence:   . Fear of Current or Ex-Partner:   . Emotionally Abused:   Marland Kitchen Physically Abused:   .  Sexually Abused:     Outpatient Medications Prior to Visit  Medication Sig Dispense Refill  . acetaminophen (TYLENOL) 325 MG tablet Take 650 mg by mouth every 6 (six) hours as needed. For headache.    Marland Kitchen amiodarone (PACERONE) 200 MG tablet Take half a tablet by mouth daily 45 tablet 0  . amLODipine (NORVASC) 5 MG tablet TAKE 1 TABLET (5 MG TOTAL) BY MOUTH DAILY. KEEP OFFICE VISIT 90 tablet 3  . atorvastatin (LIPITOR) 40 MG tablet Take 1 tablet (40 mg total) by mouth daily. 90 tablet 2  . dicyclomine (BENTYL) 20 MG tablet Take 1 tablet (20 mg total) by mouth 2 (two) times daily. 20 tablet 0  . ferrous sulfate 325 (65 FE) MG EC tablet Take 1 tablet (325 mg total) by mouth 2 (two) times daily. 60 tablet 11  . fluticasone (FLONASE) 50 MCG/ACT nasal spray USE 2 SPRAYS IN EACH NOSTRIL EVERY DAY 48 g 3  . imatinib ([name]) 400 MG tablet Take 1 tablet (400 mg total) by mouth daily. Take with meals and large glass of water.Caution:Chemotherapy 90 tablet 3  . losartan (COZAAR) 100 MG tablet TAKE 1 TABLET EVERY DAY 90 tablet 0  . meclizine (ANTIVERT) 25 MG tablet TAKE 1 TABLET THREE TIMES DAILY 90 tablet 0  . metoprolol tartrate (LOPRESSOR) 100 MG tablet TAKE 1 TABLET TWICE DAILY 180 tablet 0  . ofloxacin (FLOXIN OTIC) 0.3 % OTIC solution Place 10 drops into both ears daily. For 7 days 10 mL 0  . omeprazole (PRILOSEC) 40 MG capsule TAKE 1 CAPSULE EVERY DAY 90 capsule 0  . ondansetron (ZOFRAN) 4 MG tablet TAKE 1 TABLET EVERY 8 HOURS AS NEEDED FOR NAUSEA AND VOMITING 40 tablet 1  . Probiotic Product (PROBIOTIC FORMULA PO) Take 1 capsule by mouth daily.     [name] 20 MG TABS tablet TAKE 1 TABLET EVERY DAY WITH SUPPER 90 tablet 2  . nitroGLYCERIN (NITROSTAT) 0.4 MG SL tablet Place 1 tablet (0.4 mg total) under the tongue every 5 (five) minutes as needed for chest pain. 25 tablet 1   No facility-administered medications prior to visit.    No Known Allergies  Review of Systems  Constitutional:  Negative for fever.  HENT: Negative for congestion.   Eyes: Negative for blurred vision.  Respiratory: Negative for cough.   Cardiovascular: Negative for chest pain and palpitations.  Gastrointestinal: Negative for vomiting.  Musculoskeletal: Negative for back pain.  Skin: Negative for rash.  Neurological: Negative for loss of consciousness and headaches.  Objective:    Physical Exam Vitals and nursing note reviewed.  Constitutional:      General: He is not in acute distress.    Appearance: He is well-developed.  HENT:     Head: Normocephalic and atraumatic.     Nose: Nose normal.  Eyes:     General:        Right eye: No discharge.        Left eye: No discharge.  Cardiovascular:     Rate and Rhythm: Normal rate and regular rhythm.     Heart sounds: No murmur.  Pulmonary:     Effort: Pulmonary effort is normal.     Breath sounds: Normal breath sounds.  Abdominal:     General: Bowel sounds are normal.     Palpations: Abdomen is soft.     Tenderness: There is no abdominal tenderness.  Musculoskeletal:     Cervical back: Normal range of motion and neck supple.  Skin:    General: Skin is warm and dry.  Neurological:     Mental Status: He is alert and oriented to person, place, and time.     BP 135/70 (BP Location: Left Arm, Patient Position: Sitting, Cuff Size: Small)   Pulse 81   Temp (!) 96.1 F (35.6 C) (Temporal)   Resp 16   Ht '5\' 8"'$  (1.727 m)   Wt 196 lb (88.9 kg)   SpO2 99%   BMI 29.80 kg/m  Wt Readings from Last 3 Encounters:  03/06/20 196 lb (88.9 kg)  01/28/20 195 lb 1.3 oz (88.5 kg)  01/02/20 197 lb (89.4 kg)    Diabetic Foot Exam - Simple   No data filed     Lab Results  Component Value Date   WBC 7.9 03/06/2020   HGB 11.1 (L) 03/06/2020   HCT 34.3 (L) 03/06/2020   PLT 302.0 03/06/2020   GLUCOSE 87 03/06/2020   CHOL 113 03/06/2020   TRIG 71.0 03/06/2020   HDL 42.90 03/06/2020   LDLCALC 56 03/06/2020   ALT 9 03/06/2020   AST 15  03/06/2020   NA 142 03/06/2020   K 4.0 03/06/2020   CL 108 03/06/2020   CREATININE 1.08 03/06/2020   BUN 8 03/06/2020   CO2 26 03/06/2020   TSH 8.230 (H) 12/12/2019   PSA 0.98 09/22/2018   INR 1.07 06/27/2014    Lab Results  Component Value Date   TSH 8.230 (H) 12/12/2019   Lab Results  Component Value Date   WBC 7.9 03/06/2020   HGB 11.1 (L) 03/06/2020   HCT 34.3 (L) 03/06/2020   MCV 94.2 03/06/2020   PLT 302.0 03/06/2020   Lab Results  Component Value Date   NA 142 03/06/2020   K 4.0 03/06/2020   CHLORIDE 108 11/26/2016   CO2 26 03/06/2020   GLUCOSE 87 03/06/2020   BUN 8 03/06/2020   CREATININE 1.08 03/06/2020   BILITOT 0.4 03/06/2020   ALKPHOS 83 03/06/2020   AST 15 03/06/2020   ALT 9 03/06/2020   PROT 6.7 03/06/2020   ALBUMIN 3.8 03/06/2020   CALCIUM 8.9 03/06/2020   ANIONGAP 8 01/02/2020   EGFR 74 (L) 11/26/2016   GFR 67.21 03/06/2020   Lab Results  Component Value Date   CHOL 113 03/06/2020   Lab Results  Component Value Date   HDL 42.90 03/06/2020   Lab Results  Component Value Date   LDLCALC 56 03/06/2020   Lab Results  Component Value Date   TRIG 71.0 03/06/2020  Lab Results  Component Value Date   CHOLHDL 3 03/06/2020   No results found for: HGBA1C     Assessment & Plan:   Problem List Items Addressed This Visit    CML (chronic myelocytic leukemia) ([place]) (Chronic)    Tolerating [name] 200 mg bid and sees hematology again in May      Hyperlipidemia, mixed - Primary    Tolerating statin, encouraged heart healthy diet, avoid trans fats, minimize simple carbs and saturated fats. Increase exercise as tolerated      Relevant Medications   nitroGLYCERIN (NITROSTAT) 0.4 MG SL tablet   Other Relevant Orders   Lipid panel (Completed)   Anemia    Macrocytic will check a Vitamin B12 and cbc today      Sun-damaged skin   Hypertension    Well controlled, no changes to meds. Encouraged heart healthy diet such as the DASH diet and  exercise as tolerated.       Relevant Medications   nitroGLYCERIN (NITROSTAT) 0.4 MG SL tablet   Other Relevant Orders   CBC (Completed)   Comprehensive metabolic panel (Completed)   Coronary artery disease    Is following with cardiology and doing well. He had a diaphoretic episode but only one and it was less than 5 minutes. He will notify us if it happens again.       Relevant Medications   nitroGLYCERIN (NITROSTAT) 0.4 MG SL tablet   Renal insufficiency    Hydrate and monitor      Vitamin B12 deficiency    Patient with worsening macrocytic anemia with low vitamin B12 and a normal intrinsic factor. He is started on Vitamin B12 1000 mcg IM weekly x 4 then biweekly x 2 doses then monthly for 2 months and recheck level in 3 months. Also start SL 1000 mcg Vitamin B12 daily next month       Other Visit Diagnoses    Macrocytic anemia       Relevant Orders   Vitamin B12 (Completed)      I am having Elyn Aquas. Herbison maintain his Probiotic Product (PROBIOTIC FORMULA PO), acetaminophen, ferrous sulfate, dicyclomine, ofloxacin, amLODipine, ondansetron, fluticasone, meclizine, Xarelto, imatinib, atorvastatin, omeprazole, losartan, amiodarone, metoprolol tartrate, and nitroGLYCERIN.  Meds ordered this encounter  Medications  . nitroGLYCERIN (NITROSTAT) 0.4 MG SL tablet    Sig: Place 1 tablet (0.4 mg total) under the tongue every 5 (five) minutes as needed for chest pain.    Dispense:  25 tablet    Refill:  1     Penni Homans, MD

## 2020-03-06 NOTE — Assessment & Plan Note (Signed)
Macrocytic will check a Vitamin B12 and cbc today

## 2020-03-06 NOTE — Assessment & Plan Note (Signed)
Is following with cardiology and doing well. He had a diaphoretic episode but only one and it was less than 5 minutes. He will notify us if it happens again.

## 2020-03-06 NOTE — Progress Notes (Signed)
.  int

## 2020-03-06 NOTE — Assessment & Plan Note (Signed)
Hydrate and monitor 

## 2020-03-06 NOTE — Assessment & Plan Note (Signed)
Well controlled, no changes to meds. Encouraged heart healthy diet such as the DASH diet and exercise as tolerated.  °

## 2020-03-06 NOTE — Assessment & Plan Note (Signed)
Tolerating statin, encouraged heart healthy diet, avoid trans fats, minimize simple carbs and saturated fats. Increase exercise as tolerated 

## 2020-03-06 NOTE — Patient Instructions (Signed)
[employer] Blood Pressure cuff, upper arm, want BP 100-140/60-90 Pulse oximeter, want oxygen in 90s  Weekly vitals  Take Multivitamin with minerals, selenium Vitamin D 1000-2000 IU daily Probiotic with lactobacillus and bifidophilus Asprin EC 81 mg daily  Melatonin 2-5 mg at bedtime  [email or web address]   The mRNA technology has been in development for 20 years and we already had the Coronavirus family of viruses (which usually just cause the common cold) genetically mapped already which is why we were able to come up with viable vaccine candidates so quickly in stage 1, then stage 2 scientifically took the correct amount of time what we did to speed it up was just build the manufacturing platform at the same time we were running the experiments so if it worked we could produce faster. And stage 3 has now had many months and millions of people immunized and we are seeing the immunity hold for over 9 months now with sign of it dissipating and no significant numbers of adverse reactions.  During every flu season we see 2 anaphylactic reactions for every million shots given and we initially thought we would see 11 per million with the COVID vaccine but now we see only 2-3 with Moderna and 5 or so with [place] so compared to someone is dying every 20 minutes from [place] and more deadly and infectious strains are coming it is definitely best when weighing the risks and benefits to take the shots.  Another pooled analysis of the 5 most utilized vaccines in the world shows that after full immunization so far no one has died from [place].

## 2020-03-07 ENCOUNTER — Telehealth: Payer: Self-pay

## 2020-03-07 NOTE — Telephone Encounter (Signed)
Per PCP, B12 injections are weekly x 4 doses, every other week x 4 doses, then monthly.

## 2020-03-08 LAB — INTRINSIC FACTOR ANTIBODIES: Intrinsic Factor: NEGATIVE

## 2020-03-09 DIAGNOSIS — E538 Deficiency of other specified B group vitamins: Secondary | ICD-10-CM | POA: Insufficient documentation

## 2020-03-09 NOTE — Assessment & Plan Note (Addendum)
Patient with worsening macrocytic anemia with low vitamin B12 and a normal intrinsic factor. He is started on Vitamin B12 1000 mcg IM weekly x 4 then biweekly x 2 doses then monthly for 2 months and recheck level in 3 months. Also start SL 1000 mcg Vitamin B12 daily next month

## 2020-03-11 ENCOUNTER — Other Ambulatory Visit: Payer: Self-pay

## 2020-03-11 ENCOUNTER — Ambulatory Visit (INDEPENDENT_AMBULATORY_CARE_PROVIDER_SITE_OTHER): Payer: Medicare HMO

## 2020-03-11 DIAGNOSIS — E538 Deficiency of other specified B group vitamins: Secondary | ICD-10-CM

## 2020-03-11 MED ORDER — CYANOCOBALAMIN 1000 MCG/ML IJ SOLN
1000.0000 ug | Freq: Once | INTRAMUSCULAR | Status: AC
  Administered 2020-03-11: 1000 ug via INTRAMUSCULAR

## 2020-03-11 NOTE — Progress Notes (Addendum)
Pt here for monthly B12 injection per Dr. Charlett Blake  B12 10102mcg given IM, and pt tolerated injection well.  Next B12 injection scheduled for next week.   nursing note reviewed. Agree with documention and plan.

## 2020-03-18 ENCOUNTER — Ambulatory Visit (INDEPENDENT_AMBULATORY_CARE_PROVIDER_SITE_OTHER): Payer: Medicare HMO

## 2020-03-18 ENCOUNTER — Other Ambulatory Visit: Payer: Self-pay

## 2020-03-18 DIAGNOSIS — E538 Deficiency of other specified B group vitamins: Secondary | ICD-10-CM | POA: Diagnosis not present

## 2020-03-18 MED ORDER — CYANOCOBALAMIN 1000 MCG/ML IJ SOLN
1000.0000 ug | Freq: Once | INTRAMUSCULAR | Status: AC
  Administered 2020-03-18: 1000 ug via INTRAMUSCULAR

## 2020-03-18 NOTE — Progress Notes (Addendum)
Patient here today for b12 injection. 61mL placed in right deltoid IM. Patient tolerated well.   Nursing note reviewed. Agree with documention and plan.

## 2020-03-19 ENCOUNTER — Other Ambulatory Visit: Payer: Self-pay | Admitting: Cardiology

## 2020-03-25 ENCOUNTER — Other Ambulatory Visit: Payer: Self-pay

## 2020-03-25 ENCOUNTER — Ambulatory Visit (INDEPENDENT_AMBULATORY_CARE_PROVIDER_SITE_OTHER): Payer: Medicare HMO

## 2020-03-25 DIAGNOSIS — E538 Deficiency of other specified B group vitamins: Secondary | ICD-10-CM

## 2020-03-25 MED ORDER — CYANOCOBALAMIN 1000 MCG/ML IJ SOLN
1000.0000 ug | Freq: Once | INTRAMUSCULAR | Status: AC
  Administered 2020-03-25: 1000 ug via INTRAMUSCULAR

## 2020-03-25 NOTE — Progress Notes (Addendum)
Pt here for weekly  B12 injection per Dr.Blyth   B12 1088mcg given IM, lefr deltoid and pt tolerated injection well.  Next B12 injection scheduled for 04/02/2020  Nursing note reviewed. Agree with documention and plan.

## 2020-03-31 ENCOUNTER — Ambulatory Visit (INDEPENDENT_AMBULATORY_CARE_PROVIDER_SITE_OTHER): Payer: Medicare HMO | Admitting: *Deleted

## 2020-03-31 DIAGNOSIS — I495 Sick sinus syndrome: Secondary | ICD-10-CM | POA: Diagnosis not present

## 2020-03-31 LAB — CUP PACEART REMOTE DEVICE CHECK
Battery Remaining Longevity: 78 mo
Battery Voltage: 3.01 V
Brady Statistic AP VP Percent: 0.24 %
Brady Statistic AP VS Percent: 97.44 %
Brady Statistic AS VP Percent: 0.01 %
Brady Statistic AS VS Percent: 2.31 %
Brady Statistic RA Percent Paced: 97.58 %
Brady Statistic RV Percent Paced: 0.26 %
Date Time Interrogation Session: 20210405112132
Implantable Lead Implant Date: 20170420
Implantable Lead Implant Date: 20170420
Implantable Lead Location: 753859
Implantable Lead Location: 753860
Implantable Lead Model: 5076
Implantable Lead Model: 5076
Implantable Pulse Generator Implant Date: 20170420
Lead Channel Impedance Value: 342 Ohm
Lead Channel Impedance Value: 342 Ohm
Lead Channel Impedance Value: 380 Ohm
Lead Channel Impedance Value: 456 Ohm
Lead Channel Pacing Threshold Amplitude: 0.625 V
Lead Channel Pacing Threshold Amplitude: 0.625 V
Lead Channel Pacing Threshold Pulse Width: 0.4 ms
Lead Channel Pacing Threshold Pulse Width: 0.4 ms
Lead Channel Sensing Intrinsic Amplitude: 2.5 mV
Lead Channel Sensing Intrinsic Amplitude: 2.5 mV
Lead Channel Sensing Intrinsic Amplitude: 9.125 mV
Lead Channel Sensing Intrinsic Amplitude: 9.125 mV
Lead Channel Setting Pacing Amplitude: 2 V
Lead Channel Setting Pacing Amplitude: 2.5 V
Lead Channel Setting Pacing Pulse Width: 0.4 ms
Lead Channel Setting Sensing Sensitivity: 2 mV

## 2020-04-01 NOTE — Progress Notes (Signed)
PPM Remote  

## 2020-04-02 ENCOUNTER — Ambulatory Visit (INDEPENDENT_AMBULATORY_CARE_PROVIDER_SITE_OTHER): Payer: Medicare HMO

## 2020-04-02 ENCOUNTER — Other Ambulatory Visit: Payer: Self-pay

## 2020-04-02 DIAGNOSIS — E538 Deficiency of other specified B group vitamins: Secondary | ICD-10-CM | POA: Diagnosis not present

## 2020-04-02 MED ORDER — CYANOCOBALAMIN 1000 MCG/ML IJ SOLN
1000.0000 ug | Freq: Once | INTRAMUSCULAR | Status: AC
  Administered 2020-04-02: 1000 ug via INTRAMUSCULAR

## 2020-04-02 NOTE — Progress Notes (Addendum)
Pt here for B12 injection per Dr. Charlett Blake. Her note on B12 results say patient to get B12 once a week for 4weeks, then every 2 weeks x4, and monthly after that. Today he is getting #4 of 4 weekly.  B12 1068mcg given IM, and pt tolerated injection well.  Next B12 injection scheduled for 1 of 4 byweekly on 04-17-20.   Nursing note reviewed. Agree with documention and plan.

## 2020-04-10 ENCOUNTER — Other Ambulatory Visit: Payer: Self-pay | Admitting: Family Medicine

## 2020-04-16 ENCOUNTER — Other Ambulatory Visit: Payer: Self-pay | Admitting: *Deleted

## 2020-04-16 MED ORDER — FLUTICASONE PROPIONATE 50 MCG/ACT NA SUSP: 2.0000 | Freq: Every day | NASAL | 3 refills | Status: DC

## 2020-04-17 ENCOUNTER — Other Ambulatory Visit: Payer: Self-pay

## 2020-04-17 ENCOUNTER — Ambulatory Visit (INDEPENDENT_AMBULATORY_CARE_PROVIDER_SITE_OTHER): Payer: Medicare HMO

## 2020-04-17 DIAGNOSIS — E538 Deficiency of other specified B group vitamins: Secondary | ICD-10-CM

## 2020-04-17 MED ORDER — CYANOCOBALAMIN 1000 MCG/ML IJ SOLN
1000.0000 ug | Freq: Once | INTRAMUSCULAR | Status: AC
  Administered 2020-04-17: 1000 ug via INTRAMUSCULAR

## 2020-04-17 NOTE — Progress Notes (Addendum)
Jason [name] is a 74 y.o. male presents to the office today for B12 injection 1 of 4 byweekly injections, per physician's orders.  Original order on B12 results 03-06-2020. Cyanocobalamin 1000mg  IM  was administered left deltoid today. Patient tolerated injection. Patient due for next injection in 2 weeks. Appointment set up for 05-01-20.  [place] note reviewed. Agree with documention and plan.

## 2020-04-18 ENCOUNTER — Other Ambulatory Visit: Payer: Self-pay | Admitting: Family Medicine

## 2020-04-18 ENCOUNTER — Other Ambulatory Visit: Payer: Self-pay | Admitting: Cardiology

## 2020-04-21 ENCOUNTER — Other Ambulatory Visit: Payer: Self-pay | Admitting: Cardiology

## 2020-05-01 ENCOUNTER — Inpatient Hospital Stay: Payer: Medicare HMO | Attending: Hematology & Oncology

## 2020-05-01 ENCOUNTER — Inpatient Hospital Stay (HOSPITAL_BASED_OUTPATIENT_CLINIC_OR_DEPARTMENT_OTHER): Payer: Medicare HMO | Admitting: Hematology & Oncology

## 2020-05-01 ENCOUNTER — Encounter: Payer: Self-pay | Admitting: Hematology & Oncology

## 2020-05-01 ENCOUNTER — Other Ambulatory Visit: Payer: Self-pay

## 2020-05-01 ENCOUNTER — Ambulatory Visit (INDEPENDENT_AMBULATORY_CARE_PROVIDER_SITE_OTHER): Payer: Medicare HMO

## 2020-05-01 VITALS — BP 152/72 | HR 87 | Temp 97.1°F | Resp 18 | Wt 198.0 lb

## 2020-05-01 DIAGNOSIS — C9210 Chronic myeloid leukemia, BCR/ABL-positive, not having achieved remission: Secondary | ICD-10-CM | POA: Insufficient documentation

## 2020-05-01 DIAGNOSIS — Z79899 Other long term (current) drug therapy: Secondary | ICD-10-CM | POA: Diagnosis not present

## 2020-05-01 DIAGNOSIS — E538 Deficiency of other specified B group vitamins: Secondary | ICD-10-CM | POA: Diagnosis not present

## 2020-05-01 DIAGNOSIS — Z950 Presence of cardiac pacemaker: Secondary | ICD-10-CM | POA: Diagnosis not present

## 2020-05-01 DIAGNOSIS — I480 Paroxysmal atrial fibrillation: Secondary | ICD-10-CM | POA: Insufficient documentation

## 2020-05-01 DIAGNOSIS — Z7901 Long term (current) use of anticoagulants: Secondary | ICD-10-CM | POA: Diagnosis not present

## 2020-05-01 LAB — CMP (CANCER CENTER ONLY)
ALT: 8 U/L (ref 0–44)
AST: 14 U/L — ABNORMAL LOW (ref 15–41)
Albumin: 4 g/dL (ref 3.5–5.0)
Alkaline Phosphatase: 71 U/L (ref 38–126)
Anion gap: 10 (ref 5–15)
BUN: 10 mg/dL (ref 8–23)
CO2: 26 mmol/L (ref 22–32)
Calcium: 9 mg/dL (ref 8.9–10.3)
Chloride: 109 mmol/L (ref 98–111)
Creatinine: 1.06 mg/dL (ref 0.61–1.24)
GFR, Est AFR Am: >60 mL/min (ref >60)
GFR, Estimated: >60 mL/min (ref >60)
Glucose, Bld: 95 mg/dL (ref 70–99)
Potassium: 3.6 mmol/L (ref 3.5–5.1)
Sodium: 145 mmol/L (ref 135–145)
Total Bilirubin: 0.4 mg/dL (ref 0.3–1.2)
Total Protein: 6.7 g/dL (ref 6.5–8.1)

## 2020-05-01 LAB — CBC WITH DIFFERENTIAL (CANCER CENTER ONLY)
Abs Immature Granulocytes: 0.04 10*3/uL (ref 0.00–0.07)
Basophils Absolute: 0.2 10*3/uL — ABNORMAL HIGH (ref 0.0–0.1)
Basophils Relative: 2 %
Eosinophils Absolute: 0.4 10*3/uL (ref 0.0–0.5)
Eosinophils Relative: 5 %
HCT: 35.9 % — ABNORMAL LOW (ref 39.0–52.0)
Hemoglobin: 11.3 g/dL — ABNORMAL LOW (ref 13.0–17.0)
Immature Granulocytes: 0 %
Lymphocytes Relative: 17 %
Lymphs Abs: 1.5 10*3/uL (ref 0.7–4.0)
MCH: 30.1 pg (ref 26.0–34.0)
MCHC: 31.5 g/dL (ref 30.0–36.0)
MCV: 95.7 fL (ref 80.0–100.0)
Monocytes Absolute: 0.7 10*3/uL (ref 0.1–1.0)
Monocytes Relative: 8 %
Neutro Abs: 6.1 10*3/uL (ref 1.7–7.7)
Neutrophils Relative %: 68 %
Platelet Count: 271 10*3/uL (ref 150–400)
RBC: 3.75 MIL/uL — ABNORMAL LOW (ref 4.22–5.81)
RDW: 14.7 % (ref 11.5–15.5)
WBC Count: 8.9 10*3/uL (ref 4.0–10.5)
nRBC: 0 % (ref 0.0–0.2)

## 2020-05-01 LAB — LACTATE DEHYDROGENASE: LDH: 194 U/L — ABNORMAL HIGH (ref 98–192)

## 2020-05-01 MED ORDER — CYANOCOBALAMIN 1000 MCG/ML IJ SOLN
1000.0000 ug | INTRAMUSCULAR | Status: AC
  Administered 2020-05-01 – 2020-07-15 (×2): 1000 ug via INTRAMUSCULAR

## 2020-05-01 NOTE — Progress Notes (Signed)
Hematology and Oncology Follow Up Visit  Jason [name] [id number] 21-May-1946 74 y.o. 05/01/2020   Principle Diagnosis:  Chronic phase CML - MMR Paroxysmal atrial fibrillation - has pacemaker   Current Therapy:   [name] 400 mg by mouth daily -- started on 09/2019 Xarelto 20 mg by mouth daily   Interim History:  Jason [name] is here today for follow-up.  Unfortunately, his CML did recur.  We saw him back in October, the BCR/ABL ratio was 2.1%.  He now is back on [name].  He is on 400 mg a day.  We saw him back in January, the BCR/ABL ratio was down to 0.31%.  He is doing okay.  He is having no problems with the [place].  He does feel tired.  He apparently has vitamin B12 deficiency.  His family doctor is given him vitamin B12.  He is not taking the coronavirus vaccine.  He is worried about the side effects.  I talked to him about this.  I told him that he is already on Xarelto so you will not get blood clots.  Hopefully this was what he was worried about.  The vaccine will not affect the CML.  He will still think about getting the vaccine but hopefully he is more inclined to take it.  He has had no problems with fever.  He has had no cough or shortness of breath.  Has had no diarrhea.  There is been no leg swelling.  Overall, his performance status is ECOG 1.    Medications:  Allergies as of 05/01/2020   No Known Allergies     Medication List       Accurate as of May 01, 2020  9:56 AM. If you have any questions, ask your nurse or doctor.        acetaminophen 325 MG tablet Commonly known as: TYLENOL Take 650 mg by mouth every 6 (six) hours as needed. For headache.   amiodarone 200 MG tablet Commonly known as: PACERONE TAKE 1/2 TABLET EVERY DAY   amLODipine 5 MG tablet Commonly known as: NORVASC Take 1 tablet (5 mg total) by mouth daily.   atorvastatin 40 MG tablet Commonly known as: LIPITOR Take 1 tablet (40 mg total) by mouth daily.   dicyclomine 20 MG  tablet Commonly known as: BENTYL Take 1 tablet (20 mg total) by mouth 2 (two) times daily.   ferrous sulfate 325 (65 FE) MG EC tablet Take 1 tablet (325 mg total) by mouth 2 (two) times daily.   fluticasone 50 MCG/ACT nasal spray Commonly known as: FLONASE Place 2 sprays into both nostrils daily.   imatinib 400 MG tablet Commonly known as: [name] Take 1 tablet (400 mg total) by mouth daily. Take with meals and large glass of water.Caution:Chemotherapy   losartan 100 MG tablet Commonly known as: COZAAR TAKE 1 TABLET EVERY DAY   meclizine 25 MG tablet Commonly known as: ANTIVERT TAKE 1 TABLET THREE TIMES DAILY   metoprolol tartrate 100 MG tablet Commonly known as: LOPRESSOR TAKE 1 TABLET TWICE DAILY   nitroGLYCERIN 0.4 MG SL tablet Commonly known as: NITROSTAT Place 1 tablet (0.4 mg total) under the tongue every 5 (five) minutes as needed for chest pain.   ofloxacin 0.3 % OTIC solution Commonly known as: Floxin Otic Place 10 drops into both ears daily. For 7 days   omeprazole 40 MG capsule Commonly known as: PRILOSEC TAKE 1 CAPSULE EVERY DAY (PATIENT NEEDS OFFICE VISIT FOR GREATER QUANTITIES/REFILLS)   ondansetron 4  MG tablet Commonly known as: ZOFRAN TAKE 1 TABLET EVERY 8 HOURS AS NEEDED FOR NAUSEA AND VOMITING   PROBIOTIC FORMULA PO Take 1 capsule by mouth daily.   Xarelto 20 MG Tabs tablet Generic drug: rivaroxaban TAKE 1 TABLET EVERY DAY WITH SUPPER       Allergies: No Known Allergies  Past Medical History, Surgical history, Social history, and Family History were reviewed and updated.  Review of Systems: Review of Systems  Constitutional: Negative.   HENT: Negative.   Eyes: Negative.   Respiratory: Negative.   Cardiovascular: Negative.   Gastrointestinal: Negative.   Genitourinary: Negative.   Musculoskeletal: Negative.   Skin: Negative.   Neurological: Negative.   Endo/Heme/Allergies: Negative.   Psychiatric/Behavioral: Negative.       Physical Exam:  weight is 198 lb (89.8 kg). His oral temperature is 97.1 F (36.2 C) (abnormal). His blood pressure is 152/72 (abnormal) and his pulse is 87. His respiration is 18 and oxygen saturation is 95%.   Wt Readings from Last 3 Encounters:  05/01/20 198 lb (89.8 kg)  03/06/20 196 lb (88.9 kg)  01/28/20 195 lb 1.3 oz (88.5 kg)    Physical Exam Vitals reviewed.  HENT:     Head: Normocephalic and atraumatic.  Eyes:     Pupils: Pupils are equal, round, and reactive to light.  Cardiovascular:     Rate and Rhythm: Normal rate and regular rhythm.     Heart sounds: Normal heart sounds.  Pulmonary:     Effort: Pulmonary effort is normal.     Breath sounds: Normal breath sounds.  Abdominal:     General: Bowel sounds are normal.     Palpations: Abdomen is soft.  Musculoskeletal:        General: No tenderness or deformity. Normal range of motion.     Cervical back: Normal range of motion.  Lymphadenopathy:     Cervical: No cervical adenopathy.  Skin:    General: Skin is warm and dry.     Findings: No erythema or rash.  Neurological:     Mental Status: He is alert and oriented to person, place, and time.  Psychiatric:        Behavior: Behavior normal.        Thought Content: Thought content normal.        Judgment: Judgment normal.      Lab Results  Component Value Date   WBC 8.9 05/01/2020   HGB 11.3 (L) 05/01/2020   HCT 35.9 (L) 05/01/2020   MCV 95.7 05/01/2020   PLT 271 05/01/2020   Lab Results  Component Value Date   IRON 84 05/27/2015   TIBC 359 05/27/2015   UIBC 275 05/27/2015   IRONPCTSAT 23 05/27/2015   Lab Results  Component Value Date   RETICCTPCT 1.2 05/27/2015   RBC 3.75 (L) 05/01/2020   RETICCTABS 48.2 05/27/2015   No results found for: KPAFRELGTCHN, LAMBDASER, KAPLAMBRATIO No results found for: IGGSERUM, IGA, IGMSERUM No results found for: Odetta Pink, SPEI   Chemistry       Component Value Date/Time   NA 142 03/06/2020 0930   NA 141 12/13/2019 0955   NA 143 11/26/2016 1030   K 4.0 03/06/2020 0930   K 3.8 11/26/2016 1030   CL 108 03/06/2020 0930   CL 109 (H) 03/31/2015 1119   CO2 26 03/06/2020 0930   CO2 25 11/26/2016 1030   BUN 8 03/06/2020 0930   BUN 13 12/13/2019 0955  BUN 5.1 (L) 11/26/2016 1030   CREATININE 1.08 03/06/2020 0930   CREATININE 1.39 (H) 01/02/2020 1008   CREATININE 1.0 11/26/2016 1030      Component Value Date/Time   CALCIUM 8.9 03/06/2020 0930   CALCIUM 9.3 11/26/2016 1030   ALKPHOS 83 03/06/2020 0930   ALKPHOS 106 11/26/2016 1030   AST 15 03/06/2020 0930   AST 18 01/02/2020 1008   AST 16 11/26/2016 1030   ALT 9 03/06/2020 0930   ALT 13 01/02/2020 1008   ALT 10 11/26/2016 1030   BILITOT 0.4 03/06/2020 0930   BILITOT 0.3 01/02/2020 1008   BILITOT 0.53 11/26/2016 1030      Impression and Plan: Mr. Say is a very pleasant 74 yo caucasian male with chronic phase CML.  He was only off [place] for about 4 months before he recurred.  Hopefully, we will be able to get him back into remission with [name].  I feel confident that he will do well on [name].  I think that some of his fatigue might be from his medications.  He is on amiodarone.  He is on a beta-blocker.  The certainly could cause him to feel tired.  We will plan to get him back in another 4 months.    Volanda Napoleon, MD 5/6/20219:56 AM

## 2020-05-01 NOTE — Progress Notes (Addendum)
Patient here today for b12 injection. 68mL placed in right deltoid IM. Patient tolerated well.   Nursing note reviewed. Agree with documention and plan.

## 2020-05-15 ENCOUNTER — Ambulatory Visit (INDEPENDENT_AMBULATORY_CARE_PROVIDER_SITE_OTHER): Payer: Medicare HMO

## 2020-05-15 ENCOUNTER — Other Ambulatory Visit: Payer: Self-pay

## 2020-05-15 DIAGNOSIS — E538 Deficiency of other specified B group vitamins: Secondary | ICD-10-CM

## 2020-05-15 MED ORDER — CYANOCOBALAMIN 1000 MCG/ML IJ SOLN
1000.0000 ug | Freq: Once | INTRAMUSCULAR | Status: AC
  Administered 2020-05-15: 1000 ug via INTRAMUSCULAR

## 2020-05-15 NOTE — Progress Notes (Addendum)
Jason [name] is a 74 y.o. male presents to the office today for cyanocobalamin 1000mg /m.  injections, per physician's orders. Original order: B12 results 03-06-2020. B12 was administered IM on   today. Patient tolerated injection.  Patient next injection due: in one month, appt made for June21.   [place] check note reviewed. Agree with documention and plan.

## 2020-05-28 ENCOUNTER — Other Ambulatory Visit: Payer: Self-pay | Admitting: Cardiology

## 2020-06-03 ENCOUNTER — Other Ambulatory Visit: Payer: Self-pay | Admitting: Hematology & Oncology

## 2020-06-03 LAB — BCR/ABL

## 2020-06-05 ENCOUNTER — Other Ambulatory Visit: Payer: Self-pay | Admitting: *Deleted

## 2020-06-05 MED ORDER — LOSARTAN POTASSIUM 100 MG PO TABS: 100.0000 mg | ORAL_TABLET | Freq: Every day | ORAL | 1 refills | Status: DC

## 2020-06-16 ENCOUNTER — Encounter: Payer: Self-pay | Admitting: Hematology & Oncology

## 2020-06-17 ENCOUNTER — Other Ambulatory Visit: Payer: Self-pay

## 2020-06-17 ENCOUNTER — Ambulatory Visit (INDEPENDENT_AMBULATORY_CARE_PROVIDER_SITE_OTHER): Payer: Medicare HMO

## 2020-06-17 DIAGNOSIS — E538 Deficiency of other specified B group vitamins: Secondary | ICD-10-CM | POA: Diagnosis not present

## 2020-06-17 MED ORDER — CYANOCOBALAMIN 1000 MCG/ML IJ SOLN
1000.0000 ug | Freq: Once | INTRAMUSCULAR | Status: AC
  Administered 2020-06-17: 1000 ug via INTRAMUSCULAR

## 2020-06-17 NOTE — Progress Notes (Signed)
Pt here for monthly B12 injection per Dr.blyth  B12 1072mcg given IM,Left Deltoid and pt tolerated injection well.  Next B12 injection scheduled for next month.

## 2020-06-23 ENCOUNTER — Other Ambulatory Visit: Payer: Self-pay | Admitting: Family Medicine

## 2020-07-02 ENCOUNTER — Ambulatory Visit (INDEPENDENT_AMBULATORY_CARE_PROVIDER_SITE_OTHER): Payer: Medicare Other | Admitting: *Deleted

## 2020-07-02 DIAGNOSIS — I495 Sick sinus syndrome: Secondary | ICD-10-CM

## 2020-07-03 LAB — CUP PACEART REMOTE DEVICE CHECK
Battery Remaining Longevity: 69 mo
Battery Voltage: 3 V
Brady Statistic AP VP Percent: 0.63 %
Brady Statistic AP VS Percent: 97.06 %
Brady Statistic AS VP Percent: 0.13 %
Brady Statistic AS VS Percent: 2.19 %
Brady Statistic RA Percent Paced: 93.22 %
Brady Statistic RV Percent Paced: 0.82 %
Date Time Interrogation Session: 20210707115114
Implantable Lead Implant Date: 20170420
Implantable Lead Implant Date: 20170420
Implantable Lead Location: 753859
Implantable Lead Location: 753860
Implantable Lead Model: 5076
Implantable Lead Model: 5076
Implantable Pulse Generator Implant Date: 20170420
Lead Channel Impedance Value: 342 Ohm
Lead Channel Impedance Value: 342 Ohm
Lead Channel Impedance Value: 380 Ohm
Lead Channel Impedance Value: 437 Ohm
Lead Channel Pacing Threshold Amplitude: 0.625 V
Lead Channel Pacing Threshold Amplitude: 0.75 V
Lead Channel Pacing Threshold Pulse Width: 0.4 ms
Lead Channel Pacing Threshold Pulse Width: 0.4 ms
Lead Channel Sensing Intrinsic Amplitude: 2.625 mV
Lead Channel Sensing Intrinsic Amplitude: 2.625 mV
Lead Channel Sensing Intrinsic Amplitude: 8.75 mV
Lead Channel Sensing Intrinsic Amplitude: 8.75 mV
Lead Channel Setting Pacing Amplitude: 2 V
Lead Channel Setting Pacing Amplitude: 2.5 V
Lead Channel Setting Pacing Pulse Width: 0.4 ms
Lead Channel Setting Sensing Sensitivity: 2 mV

## 2020-07-04 NOTE — Progress Notes (Signed)
Remote pacemaker transmission.   

## 2020-07-15 ENCOUNTER — Other Ambulatory Visit: Payer: Self-pay

## 2020-07-15 ENCOUNTER — Ambulatory Visit (INDEPENDENT_AMBULATORY_CARE_PROVIDER_SITE_OTHER): Payer: Medicare Other

## 2020-07-15 DIAGNOSIS — E538 Deficiency of other specified B group vitamins: Secondary | ICD-10-CM

## 2020-07-15 MED ORDER — CYANOCOBALAMIN 1000 MCG/ML IJ SOLN: 1000.0000 ug | Freq: Once | INTRAMUSCULAR | Status: DC

## 2020-07-15 NOTE — Progress Notes (Addendum)
Pt here for monthly B12 injection per Dr. Charlett Blake  B12 1047mcg given IM in right deltoid and pt tolerated injection well.  Next B12 injection scheduled for next month.   [place] note reviewed. Agree with documention and plan.

## 2020-07-29 ENCOUNTER — Encounter: Payer: Self-pay | Admitting: *Deleted

## 2020-07-31 NOTE — Progress Notes (Signed)
HPI: FU coronary artery disease and atrial fibrillation. Patient had stents placed at [facility] in 2000. Records are not available. Patient seen preoperatively at [facility] on September 01 2012 and electrocardiogram showed atrial fibrillation. TSH in September of 2013 was 1.612. Patient has been maintained on anticoagulation. Monitor April 2016 showed paroxysmal atrial fibrillation and Toprol increased. Nuclear study April 2017 showed ejection fraction 43%, inferior and lateral infarct but no ischemia. Has had pacemaker placed secondary to tachybradycardia syndrome. Echocardiogram July 2020 showed ejection fraction 50 to 81%, grade 1 diastolic dysfunction, mild to moderate aortic insufficiency and mildly dilated ascending aorta.  Abdominal ultrasound December 2020 showed 4.4 cm abdominal aortic aneurysm.  Since last seen,he has dyspnea with more vigorous activities but no orthopnea, PND, pedal edema, chest pain or syncope.  He had one spell of dizziness in the past month.  Current Outpatient Medications  Medication Sig Dispense Refill  . acetaminophen (TYLENOL) 325 MG tablet Take 650 mg by mouth every 6 (six) hours as needed. For headache.    Marland Kitchen amiodarone (PACERONE) 200 MG tablet TAKE 1/2 TABLET EVERY DAY 45 tablet 2  . amLODipine (NORVASC) 5 MG tablet Take 1 tablet (5 mg total) by mouth daily. 90 tablet 2  . atorvastatin (LIPITOR) 40 MG tablet Take 1 tablet (40 mg total) by mouth daily. 90 tablet 2  . dicyclomine (BENTYL) 20 MG tablet Take 1 tablet (20 mg total) by mouth 2 (two) times daily. 20 tablet 0  . ferrous sulfate 325 (65 FE) MG EC tablet Take 1 tablet (325 mg total) by mouth 2 (two) times daily. 60 tablet 11  . fluticasone (FLONASE) 50 MCG/ACT nasal spray Place 2 sprays into both nostrils daily. 48 g 3  . imatinib ([name]) 400 MG tablet Take 1 tablet (400 mg total) by mouth daily. Take with meals and large glass of water.Caution:Chemotherapy 90 tablet 3  . losartan  (COZAAR) 100 MG tablet Take 1 tablet (100 mg total) by mouth daily. 90 tablet 1  . meclizine (ANTIVERT) 25 MG tablet TAKE 1 TABLET THREE TIMES DAILY 90 tablet 0  . metoprolol tartrate (LOPRESSOR) 100 MG tablet TAKE 1 TABLET TWICE DAILY (Patient taking differently: Take 100 mg by mouth daily. ) 180 tablet 0  . nitroGLYCERIN (NITROSTAT) 0.4 MG SL tablet Place 1 tablet (0.4 mg total) under the tongue every 5 (five) minutes as needed for chest pain. 25 tablet 1  . ofloxacin (FLOXIN OTIC) 0.3 % OTIC solution Place 10 drops into both ears daily. For 7 days 10 mL 0  . omeprazole (PRILOSEC) 40 MG capsule TAKE 1 CAPSULE EVERY DAY (NEED OFFICE VISIT FOR GREATER QUANTITIES/REFILLS) 90 capsule 0  . ondansetron (ZOFRAN) 4 MG tablet TAKE 1 TABLET EVERY 8 HOURS AS NEEDED FOR NAUSEA AND VOMITING 40 tablet 1  . Probiotic Product (PROBIOTIC FORMULA PO) Take 1 capsule by mouth daily.     [name] 20 MG TABS tablet TAKE 1 TABLET EVERY DAY WITH SUPPER 90 tablet 1   Current Facility-Administered Medications  Medication Dose Route Frequency Provider Last Rate Last Admin  . cyanocobalamin ((VITAMIN B-12)) injection 1,000 mcg  1,000 mcg Intramuscular Q30 days Mosie Lukes, MD   1,000 mcg at 07/15/20 8563     Past Medical History:  Diagnosis Date  . Anemia 05/08/2015  . CML (chronic myelocytic leukemia) ([place]) 12/16/2011  . Coronary heart disease   . CVA (cerebrovascular accident) ([place])   . GERD (gastroesophageal reflux disease)   .  Grief reaction 03/18/2014  . H/O tobacco use, presenting hazards to health 02/24/2012   No cigarettes since pacemaker placement. Encouraged ongoing efforts   . History of chicken pox   . History of kidney stones   . Hyperlipidemia   . Hypertension   . IBS (irritable bowel syndrome) 09/20/2013  . Medicare annual wellness visit, subsequent 03/23/2014  . PAF (paroxysmal atrial fibrillation) ([place])   . Symptomatic bradycardia    a. symptomatic bradycardia due to sinus node dysfunction  s/p RA and RV Medtronic pacemaker 04/15/16    Past Surgical History:  Procedure Laterality Date  . CATARACT EXTRACTION    . CHOLECYSTECTOMY  2003  . CORONARY ANGIOPLASTY WITH STENT PLACEMENT  2000  . EP IMPLANTABLE DEVICE N/A 04/15/2016   Procedure: Pacemaker Implant;  Surgeon: Evans Lance, MD; Medtronic (serial number [id number]) pacemaker; Laterality: Left    Social History   Socioeconomic History  . Marital status: Widowed    Spouse name: Not on file  . Number of children: 3  . Years of education: Not on file  . Highest education level: Not on file  Occupational History  . Occupation: Retired    Comment: Retired  Immunologist  . Smoking status: Former Smoker    Packs/day: 0.25    Years: 40.00    Pack years: 10.00    Types: Cigarettes    Start date: 10/30/1967    Quit date: 04/14/2016    Years since quitting: 4.3  . Smokeless tobacco: Never Used  . Tobacco comment: 04-16 quit smoking  Vaping Use  . Vaping Use: Never used  Substance and Sexual Activity  . Alcohol use: No    Alcohol/week: 0.0 standard drinks    Comment: Rare  . Drug use: No  . Sexual activity: Never    Comment: wife died in 2023/01/23 married 39 years, grandson livew with patient  Other Topics Concern  . Not on file  Social History Narrative  . Not on file   Social Determinants of Health   Financial Resource Strain: Low Risk   . Difficulty of Paying Living Expenses: Not hard at all  Food Insecurity: No Food Insecurity  . Worried About Charity fundraiser in the Last Year: Never true  . Ran Out of Food in the Last Year: Never true  Transportation Needs: No Transportation Needs  . Lack of Transportation (Medical): No  . Lack of Transportation (Non-Medical): No  Physical Activity:   . Days of Exercise per Week:   . Minutes of Exercise per Session:   Stress:   . Feeling of Stress :   Social Connections:   . Frequency of Communication with Friends and Family:   . Frequency of Social Gatherings with  Friends and Family:   . Attends Religious Services:   . Active Member of Clubs or Organizations:   . Attends Archivist Meetings:   Marland Kitchen Marital Status:   Intimate Partner Violence:   . Fear of Current or Ex-Partner:   . Emotionally Abused:   Marland Kitchen Physically Abused:   . Sexually Abused:     Family History  Problem Relation Age of Onset  . Arthritis Mother   . Hypertension Mother   . Heart disease Mother        Rheumatic fever  . Rheumatic fever Mother   . Other Mother        brain tumor  . Cancer Mother        leukemia  . Hypertension Father   .  Alcohol abuse Father   . Diabetes Son   . Colon cancer Neg Hx   . Breast cancer Neg Hx   . Prostate cancer Neg Hx     ROS: no fevers or chills, productive cough, hemoptysis, dysphasia, odynophagia, melena, hematochezia, dysuria, hematuria, rash, seizure activity, orthopnea, PND, pedal edema, claudication. Remaining systems are negative.  Physical Exam: Well-developed well-nourished in no acute distress.  Skin is warm and dry.  HEENT is normal.  Neck is supple.  Chest is clear to auscultation with normal expansion.  Cardiovascular exam is regular rate and rhythm.  Abdominal exam nontender or distended. No masses palpated. Extremities show no edema. neuro grossly intact  ECG-sinus rhythm with PACs and PVCs.  Right bundle branch block, left ventricular hypertrophy.  Personally reviewed  A/P  1 coronary artery disease-patient doing well with no chest pain.  Continue statin.  No aspirin given need for Xarelto.  2 abdominal aortic aneurysm-we will arrange follow-up abdominal ultrasound.  3 paroxysmal atrial fibrillation-continue amiodarone at present dose.  Check chest x-ray and TSH.  Liver functions May 2021 normal.  Continue Xarelto.    4 hypertension-blood pressure controlled.  Continue present medications.  We will change regular metoprolol to Toprol 100 mg daily.  5 hyperlipidemia-continue statin.  6  pacemaker-patient is followed by electrophysiology.  7 history of mild to moderate aortic insufficiency-we will arrange follow-up echocardiogram.  Kirk Ruths, MD

## 2020-08-13 ENCOUNTER — Encounter: Payer: Self-pay | Admitting: Cardiology

## 2020-08-13 ENCOUNTER — Ambulatory Visit (INDEPENDENT_AMBULATORY_CARE_PROVIDER_SITE_OTHER): Payer: Medicare Other | Admitting: Cardiology

## 2020-08-13 ENCOUNTER — Other Ambulatory Visit: Payer: Self-pay

## 2020-08-13 VITALS — BP 126/64 | HR 68 | Ht 68.0 in | Wt 195.0 lb

## 2020-08-13 DIAGNOSIS — I351 Nonrheumatic aortic (valve) insufficiency: Secondary | ICD-10-CM | POA: Diagnosis not present

## 2020-08-13 DIAGNOSIS — I714 Abdominal aortic aneurysm, without rupture, unspecified: Secondary | ICD-10-CM

## 2020-08-13 DIAGNOSIS — I2510 Atherosclerotic heart disease of native coronary artery without angina pectoris: Secondary | ICD-10-CM | POA: Diagnosis not present

## 2020-08-13 DIAGNOSIS — I10 Essential (primary) hypertension: Secondary | ICD-10-CM

## 2020-08-13 DIAGNOSIS — I480 Paroxysmal atrial fibrillation: Secondary | ICD-10-CM

## 2020-08-13 MED ORDER — METOPROLOL SUCCINATE ER 100 MG PO TB24: 100.0000 mg | ORAL_TABLET | Freq: Every day | ORAL | 3 refills | Status: DC

## 2020-08-13 NOTE — Patient Instructions (Signed)
Medication Instructions:   STOP CURRENT METOPROLOL  START METOPROLOL SUCC ER 100 MG ONCE DAILY  *If you need a refill on your cardiac medications before your next appointment, please call your pharmacy*   Lab Work:  Your physician recommends that you HAVE LAB WORK TODAY  If you have labs (blood work) drawn today and your tests are completely normal, you will receive your results only by: Marland Kitchen MyChart Message (if you have MyChart) OR . A paper copy in the mail If you have any lab test that is abnormal or we need to change your treatment, we will call you to review the results.   Testing/Procedures:  Your physician has requested that you have an echocardiogram. Echocardiography is a painless test that uses sound waves to create images of your heart. It provides your doctor with information about the size and shape of your heart and how well your heart's chambers and valves are working. This procedure takes approximately one hour. There are no restrictions for this procedure.AT THE [place] OFFICE  Your physician has requested that you have an abdominal aorta duplex. During this test, an ultrasound is used to evaluate the aorta. Allow 30 minutes for this exam. Do not eat after midnight the day before and avoid carbonated beverages AT THE [place] OFFICE  A chest x-ray takes a picture of the organs and structures inside the chest, including the heart, lungs, and blood vessels. This test can show several things, including, whether the heart is enlarges; whether fluid is building up in the lungs; and whether pacemaker / defibrillator leads are still in place. 1ST FLOOR IMAGING DEPARTMENT     Follow-Up: At [facility], you and your health needs are our priority.  As part of our continuing mission to provide you with exceptional heart care, we have created designated Provider Care Teams.  These Care Teams include your primary Cardiologist (physician) and Advanced Practice Providers (APPs -   Physician Assistants and Nurse Practitioners) who all work together to provide you with the care you need, when you need it.  We recommend signing up for the patient portal called "MyChart".  Sign up information is provided on this After Visit Summary.  MyChart is used to connect with patients for Virtual Visits (Telemedicine).  Patients are able to view lab/test results, encounter notes, upcoming appointments, etc.  Non-urgent messages can be sent to your provider as well.   To learn more about what you can do with MyChart, go to [email or web address].    Your next appointment:   12 month(s)  The format for your next appointment:   In Person  Provider:   Kirk Ruths, MD

## 2020-08-14 ENCOUNTER — Telehealth: Payer: Self-pay | Admitting: *Deleted

## 2020-08-14 DIAGNOSIS — R7989 Other specified abnormal findings of blood chemistry: Secondary | ICD-10-CM

## 2020-08-14 LAB — TSH: TSH: 9.24 u[IU]/mL — ABNORMAL HIGH (ref 0.450–4.500)

## 2020-08-14 MED ORDER — LEVOTHYROXINE SODIUM 25 MCG PO TABS: 25.0000 ug | ORAL_TABLET | Freq: Every day | ORAL | 3 refills | Status: DC

## 2020-08-14 NOTE — Telephone Encounter (Signed)
Spoke with pt, Aware of dr crenshaw's recommendations. New script sent to the pharmacy and Lab orders mailed to the pt  

## 2020-08-14 NOTE — Telephone Encounter (Addendum)
Left message for pt to call    ----- Message from Lelon Perla, MD sent at 08/14/2020  7:20 AM EDT ----- Add synthroid 25 micrograms daily; TSH, Free T4. Kirk Ruths

## 2020-08-15 ENCOUNTER — Other Ambulatory Visit: Payer: Self-pay

## 2020-08-15 ENCOUNTER — Ambulatory Visit (INDEPENDENT_AMBULATORY_CARE_PROVIDER_SITE_OTHER): Payer: Medicare Other

## 2020-08-15 DIAGNOSIS — E538 Deficiency of other specified B group vitamins: Secondary | ICD-10-CM

## 2020-08-15 MED ORDER — CYANOCOBALAMIN 1000 MCG/ML IJ SOLN
1000.0000 ug | Freq: Once | INTRAMUSCULAR | Status: AC
  Administered 2020-08-15: 1000 ug via INTRAMUSCULAR

## 2020-08-15 NOTE — Progress Notes (Signed)
Pt here for monthly B12 injection per Dr. Charlett Blake  B12 1057mcg given IM, and pt tolerated injection well. Next b12 can be done at next ov in Sept with PCP.

## 2020-08-15 NOTE — Progress Notes (Signed)
Noted  Candy Leverett R Lowne Chase, DO  

## 2020-08-18 ENCOUNTER — Telehealth: Payer: Self-pay | Admitting: Cardiology

## 2020-08-18 NOTE — Telephone Encounter (Signed)
Called the patient and began discussing his questions but got disconnected. Attempted to call the patient back but got disconnected again. Will attempt to call again later in the day.

## 2020-08-18 NOTE — Telephone Encounter (Signed)
Patient returned a call to the nurse

## 2020-08-18 NOTE — Telephone Encounter (Signed)
Ok to take both but pt should take iron supplement at least 4 hours after levothyroxine to avoid interaction (levothyroxine is less effective if iron is taken closer together time-wise).

## 2020-08-18 NOTE — Telephone Encounter (Signed)
Pt would like to speak with Dr. Jacalyn Lefevre nurse about some new medication that they prescribed to him. It is thyroid medication.

## 2020-08-18 NOTE — Telephone Encounter (Signed)
I was able to get back in contact with the patient after being disconnected multiple times. Patient is calling because at his last OV with [name] he was prescribed Synthroid. He was reading about the Synthroid which said not to take with any Iron supplements. Patient is concerned because he is currently taking ferrous sulfate 325 (65 FE) MG EC tablet. Will route to PharmD for advisement.

## 2020-08-18 NOTE — Telephone Encounter (Signed)
Pt aware and verbalizes understanding./cy ?

## 2020-08-25 ENCOUNTER — Ambulatory Visit (INDEPENDENT_AMBULATORY_CARE_PROVIDER_SITE_OTHER): Payer: Medicare Other | Admitting: Cardiology

## 2020-08-25 ENCOUNTER — Other Ambulatory Visit: Payer: Self-pay

## 2020-08-25 ENCOUNTER — Encounter: Payer: Self-pay | Admitting: Cardiology

## 2020-08-25 VITALS — BP 132/80 | HR 78 | Ht 68.0 in | Wt 195.0 lb

## 2020-08-25 DIAGNOSIS — I495 Sick sinus syndrome: Secondary | ICD-10-CM

## 2020-08-25 NOTE — Progress Notes (Signed)
Electrophysiology Office Note   Date:  08/25/2020   ID:  [name], Jason 07/28/1946, MRN [id number]  PCP:  Mosie Lukes, MD  Cardiologist:  Dennard Nip Primary Electrophysiologist:  Constance Haw, MD    No chief complaint on file.    History of Present Illness: Jason [name] is a 74 y.o. male who is being seen today for the evaluation of atrial fibrillation at the request of [place]. Presenting today for electrophysiology evaluation.  He has a history of coronary artery disease, paroxysmal atrial fibrillation, tachybradycardia syndrome status post pacemaker, CLL, CVA, hypertension, hyperlipidemia, AAA.  He had a Medtronic dual-chamber pacemaker implanted 04/16/16.  Today, denies symptoms of palpitations, chest pain, shortness of breath, orthopnea, PND, lower extremity edema, claudication,  presyncope, syncope, bleeding, or neurologic sequela. The patient is tolerating medications without difficulties.  Since last being seen he has done well.  He has had minimal episodes of atrial fibrillation.  He does complain of some dizziness which he attributes to a loose tooth.  He is planning on getting it extracted.  Aside from that he has no complaints.  Past Medical History:  Diagnosis Date   Anemia 05/08/2015   CML (chronic myelocytic leukemia) ([place]) 12/16/2011   Coronary heart disease    CVA (cerebrovascular accident) ([place])    GERD (gastroesophageal reflux disease)    Grief reaction 03/18/2014   H/O tobacco use, presenting hazards to health 02/24/2012   No cigarettes since pacemaker placement. Encouraged ongoing efforts    History of chicken pox    History of kidney stones    Hyperlipidemia    Hypertension    IBS (irritable bowel syndrome) 09/20/2013   Medicare annual wellness visit, subsequent 03/23/2014   PAF (paroxysmal atrial fibrillation) (HCC)    Symptomatic bradycardia    a. symptomatic bradycardia due to sinus node dysfunction s/p RA and RV  Medtronic pacemaker 04/15/16   Past Surgical History:  Procedure Laterality Date   CATARACT EXTRACTION     CHOLECYSTECTOMY  2003   CORONARY ANGIOPLASTY WITH STENT PLACEMENT  2000   EP IMPLANTABLE DEVICE N/A 04/15/2016   Procedure: Pacemaker Implant;  Surgeon: Evans Lance, MD; Medtronic (serial number [id number]) pacemaker; Laterality: Left     Current Outpatient Medications  Medication Sig Dispense Refill   acetaminophen (TYLENOL) 325 MG tablet Take 650 mg by mouth every 6 (six) hours as needed. For headache.     amiodarone (PACERONE) 200 MG tablet TAKE 1/2 TABLET EVERY DAY 45 tablet 2   amLODipine (NORVASC) 5 MG tablet Take 1 tablet (5 mg total) by mouth daily. 90 tablet 2   atorvastatin (LIPITOR) 40 MG tablet Take 1 tablet (40 mg total) by mouth daily. 90 tablet 2   dicyclomine (BENTYL) 20 MG tablet Take 1 tablet (20 mg total) by mouth 2 (two) times daily. 20 tablet 0   ferrous sulfate 325 (65 FE) MG EC tablet Take 1 tablet (325 mg total) by mouth 2 (two) times daily. 60 tablet 11   fluticasone (FLONASE) 50 MCG/ACT nasal spray Place 2 sprays into both nostrils daily. 48 g 3   imatinib ([name]) 400 MG tablet Take 1 tablet (400 mg total) by mouth daily. Take with meals and large glass of water.Caution:Chemotherapy 90 tablet 3   levothyroxine (SYNTHROID) 25 MCG tablet Take 1 tablet (25 mcg total) by mouth daily before breakfast. 90 tablet 3   losartan (COZAAR) 100 MG tablet Take 1 tablet (100 mg total) by mouth daily. 90 tablet 1  meclizine (ANTIVERT) 25 MG tablet TAKE 1 TABLET THREE TIMES DAILY 90 tablet 0   metoprolol succinate (TOPROL XL) 100 MG 24 hr tablet Take 1 tablet (100 mg total) by mouth daily. 90 tablet 3   nitroGLYCERIN (NITROSTAT) 0.4 MG SL tablet Place 1 tablet (0.4 mg total) under the tongue every 5 (five) minutes as needed for chest pain. 25 tablet 1   ofloxacin (FLOXIN OTIC) 0.3 % OTIC solution Place 10 drops into both ears daily. For 7 days 10 mL 0    omeprazole (PRILOSEC) 40 MG capsule TAKE 1 CAPSULE EVERY DAY (NEED OFFICE VISIT FOR GREATER QUANTITIES/REFILLS) 90 capsule 0   ondansetron (ZOFRAN) 4 MG tablet TAKE 1 TABLET EVERY 8 HOURS AS NEEDED FOR NAUSEA AND VOMITING 40 tablet 1   Probiotic Product (PROBIOTIC FORMULA PO) Take 1 capsule by mouth daily.      XARELTO 20 MG TABS tablet TAKE 1 TABLET EVERY DAY WITH SUPPER 90 tablet 1   Current Facility-Administered Medications  Medication Dose Route Frequency Provider Last Rate Last Admin   cyanocobalamin ((VITAMIN B-12)) injection 1,000 mcg  1,000 mcg Intramuscular Q30 days Mosie Lukes, MD   1,000 mcg at 07/15/20 2595    Allergies:   Patient has no known allergies.   Social History:  The patient  reports that he quit smoking about 4 years ago. His smoking use included cigarettes. He started smoking about 52 years ago. He has a 10.00 pack-year smoking history. He has never used smokeless tobacco. He reports that he does not drink alcohol and does not use drugs.   Family History:  The patient's family history includes Alcohol abuse in his father; Arthritis in his mother; Cancer in his mother; Diabetes in his son; Heart disease in his mother; Hypertension in his father and mother; Other in his mother; Rheumatic fever in his mother.    ROS:  Please see the history of present illness.   Otherwise, review of systems is positive for none.   All other systems are reviewed and negative.   PHYSICAL EXAM: VS:  BP 132/80    Pulse 78    Ht 5\' 8"  (1.727 m)    Wt 195 lb (88.5 kg)    SpO2 97%    BMI 29.65 kg/m  , BMI Body mass index is 29.65 kg/m. GEN: Well nourished, well developed, in no acute distress  HEENT: normal  Neck: no JVD, carotid bruits, or masses Cardiac: RRR; no murmurs, rubs, or gallops,no edema  Respiratory:  clear to auscultation bilaterally, normal work of breathing GI: soft, nontender, nondistended, + BS MS: no deformity or atrophy  Skin: warm and dry, device site well  healed Neuro:  Strength and sensation are intact Psych: euthymic mood, full affect  EKG:  EKG is not ordered today. Personal review of the ekg ordered 08/13/20 shows atrial paced, right bundle branch block, left anterior fascicular block  Personal review of the device interrogation today. Results in [place]: 05/01/2020: ALT 8; BUN 10; Creatinine 1.06; Hemoglobin 11.3; Platelet Count 271; Potassium 3.6; Sodium 145 08/13/2020: TSH 9.240    Lipid Panel     Component Value Date/Time   CHOL 113 03/06/2020 0930   TRIG 71.0 03/06/2020 0930   HDL 42.90 03/06/2020 0930   CHOLHDL 3 03/06/2020 0930   VLDL 14.2 03/06/2020 0930   LDLCALC 56 03/06/2020 0930     Wt Readings from Last 3 Encounters:  08/25/20 195 lb (88.5 kg)  08/13/20 195 lb (88.5 kg)  05/01/20 198 lb (89.8 kg)      Other studies Reviewed: Additional studies/ records that were reviewed today include: TTE 04/16/16  Review of the above records today demonstrates:  - Left ventricle: The cavity size was normal. Wall thickness was   normal. Systolic function was mildly to moderately reduced. The   estimated ejection fraction was in the range of 40% to 45%.   Diffuse hypokinesis. Doppler parameters are consistent with   abnormal left ventricular relaxation (grade 1 diastolic   dysfunction). - Aortic valve: Trileaflet; mildly calcified leaflets. There was   mild to moderate regurgitation. - Aorta: Mildly dilated aortic root. Dilated ascending aorta.   Ascending aorta: 44 mm. Aortic root dimension: 37 mm (ED). - Mitral valve: There was no significant regurgitation. - Right ventricle: The cavity size was normal. Pacer wire or   catheter noted in right ventricle. Systolic function was normal. - Pulmonary arteries: No complete TR doppler jet so unable to   estimate PA systolic pressure. - Inferior vena cava: The vessel was normal in size. The   respirophasic diameter changes were in the normal range (>= 50%),    consistent with normal central venous pressure.   ASSESSMENT AND PLAN:  1.  Tachybradycardia syndrome: Status post Medtronic dual-chamber pacemaker implanted in 2017.  Device functioning appropriately.  No change.    2.  Paroxysmal atrial fibrillation: Currently on Xarelto and amiodarone (high risk medication monitoring performed).  CHA2DS2-VASc of 5.  Remains in sinus rhythm with minimal atrial fibrillation.  No changes.  3.  Coronary arteries: On aspirin, beta-blocker, statin.  No current chest pain.  No changes.    4.  Hypertension: Currently well controlled  The patient does not have concerns regarding his medicines.  The following changes were made today: None  Labs/ tests ordered today include:  No orders of the defined types were placed in this encounter.    Disposition:   FU with Eion Timbrook 6 months  Signed, Debby Clyne Meredith Leeds, MD  08/25/2020 2:24 PM     [facility] [address] [place] [address] [phone] (office) [phone] (fax)

## 2020-08-25 NOTE — Patient Instructions (Signed)
Medication Instructions:  Your physician recommends that you continue on your current medications as directed. Please refer to the Current Medication list given to you today.  *If you need a refill on your cardiac medications before your next appointment, please call your pharmacy*   Lab Work: None ordered If you have labs (blood work) drawn today and your tests are completely normal, you will receive your results only by: Marland Kitchen MyChart Message (if you have MyChart) OR . A paper copy in the mail If you have any lab test that is abnormal or we need to change your treatment, we will call you to review the results.   Testing/Procedures: None ordered   Follow-Up: Remote monitoring is used to monitor your Pacemaker of ICD from home. This monitoring reduces the number of office visits required to check your device to one time per year. It allows [place] to keep an eye on the functioning of your device to ensure it is working properly. You are scheduled for a device check from home on 09/29/2020. You may send your transmission at any time that day. If you have a wireless device, the transmission will be sent automatically. After your physician reviews your transmission, you will receive a postcard with your next transmission date.  At [facility], you and your health needs are our priority.  As part of our continuing mission to provide you with exceptional heart care, we have created designated Provider Care Teams.  These Care Teams include your primary Cardiologist (physician) and Advanced Practice Providers (APPs -  Physician Assistants and Nurse Practitioners) who all work together to provide you with the care you need, when you need it.  We recommend signing up for the patient portal called "MyChart".  Sign up information is provided on this After Visit Summary.  MyChart is used to connect with patients for Virtual Visits (Telemedicine).  Patients are able to view lab/test results, encounter notes,  upcoming appointments, etc.  Non-urgent messages can be sent to your provider as well.   To learn more about what you can do with MyChart, go to [email or web address].    Your next appointment:   6 month(s)  The format for your next appointment:   In Person  Provider:   Allegra Lai, MD   Thank you for choosing [facility]!!   Trinidad Curet, RN [phone]    Other Instructions

## 2020-08-30 ENCOUNTER — Other Ambulatory Visit: Payer: Self-pay | Admitting: Hematology & Oncology

## 2020-08-30 DIAGNOSIS — C9210 Chronic myeloid leukemia, BCR/ABL-positive, not having achieved remission: Secondary | ICD-10-CM

## 2020-09-03 ENCOUNTER — Telehealth: Payer: Self-pay | Admitting: Hematology & Oncology

## 2020-09-03 ENCOUNTER — Encounter: Payer: Self-pay | Admitting: Hematology & Oncology

## 2020-09-03 ENCOUNTER — Inpatient Hospital Stay: Payer: Medicare Other | Attending: Hematology & Oncology

## 2020-09-03 ENCOUNTER — Inpatient Hospital Stay (HOSPITAL_BASED_OUTPATIENT_CLINIC_OR_DEPARTMENT_OTHER): Payer: Medicare Other | Admitting: Hematology & Oncology

## 2020-09-03 ENCOUNTER — Other Ambulatory Visit: Payer: Self-pay

## 2020-09-03 VITALS — BP 144/74 | HR 91 | Temp 98.2°F | Resp 18 | Wt 197.0 lb

## 2020-09-03 DIAGNOSIS — E039 Hypothyroidism, unspecified: Secondary | ICD-10-CM | POA: Insufficient documentation

## 2020-09-03 DIAGNOSIS — C9210 Chronic myeloid leukemia, BCR/ABL-positive, not having achieved remission: Secondary | ICD-10-CM

## 2020-09-03 DIAGNOSIS — I480 Paroxysmal atrial fibrillation: Secondary | ICD-10-CM | POA: Diagnosis not present

## 2020-09-03 DIAGNOSIS — Z950 Presence of cardiac pacemaker: Secondary | ICD-10-CM | POA: Insufficient documentation

## 2020-09-03 DIAGNOSIS — Z7901 Long term (current) use of anticoagulants: Secondary | ICD-10-CM | POA: Diagnosis not present

## 2020-09-03 DIAGNOSIS — Z79899 Other long term (current) drug therapy: Secondary | ICD-10-CM | POA: Diagnosis not present

## 2020-09-03 DIAGNOSIS — E538 Deficiency of other specified B group vitamins: Secondary | ICD-10-CM | POA: Insufficient documentation

## 2020-09-03 LAB — CMP (CANCER CENTER ONLY)
ALT: 7 U/L (ref 0–44)
AST: 13 U/L — ABNORMAL LOW (ref 15–41)
Albumin: 3.9 g/dL (ref 3.5–5.0)
Alkaline Phosphatase: 68 U/L (ref 38–126)
Anion gap: 8 (ref 5–15)
BUN: 8 mg/dL (ref 8–23)
CO2: 28 mmol/L (ref 22–32)
Calcium: 8.9 mg/dL (ref 8.9–10.3)
Chloride: 110 mmol/L (ref 98–111)
Creatinine: 1.15 mg/dL (ref 0.61–1.24)
GFR, Est AFR Am: >60 mL/min (ref >60)
GFR, Estimated: >60 mL/min (ref >60)
Glucose, Bld: 99 mg/dL (ref 70–99)
Potassium: 3.7 mmol/L (ref 3.5–5.1)
Sodium: 146 mmol/L — ABNORMAL HIGH (ref 135–145)
Total Bilirubin: 0.4 mg/dL (ref 0.3–1.2)
Total Protein: 6.6 g/dL (ref 6.5–8.1)

## 2020-09-03 LAB — CBC WITH DIFFERENTIAL (CANCER CENTER ONLY)
Abs Immature Granulocytes: 0.01 10*3/uL (ref 0.00–0.07)
Basophils Absolute: 0.1 10*3/uL (ref 0.0–0.1)
Basophils Relative: 1 %
Eosinophils Absolute: 0.4 10*3/uL (ref 0.0–0.5)
Eosinophils Relative: 5 %
HCT: 36 % — ABNORMAL LOW (ref 39.0–52.0)
Hemoglobin: 11.6 g/dL — ABNORMAL LOW (ref 13.0–17.0)
Immature Granulocytes: 0 %
Lymphocytes Relative: 17 %
Lymphs Abs: 1.4 10*3/uL (ref 0.7–4.0)
MCH: 30.3 pg (ref 26.0–34.0)
MCHC: 32.2 g/dL (ref 30.0–36.0)
MCV: 94 fL (ref 80.0–100.0)
Monocytes Absolute: 0.7 10*3/uL (ref 0.1–1.0)
Monocytes Relative: 9 %
Neutro Abs: 5.7 10*3/uL (ref 1.7–7.7)
Neutrophils Relative %: 68 %
Platelet Count: 248 10*3/uL (ref 150–400)
RBC: 3.83 MIL/uL — ABNORMAL LOW (ref 4.22–5.81)
RDW: 15 % (ref 11.5–15.5)
WBC Count: 8.3 10*3/uL (ref 4.0–10.5)
nRBC: 0 % (ref 0.0–0.2)

## 2020-09-03 LAB — SAVE SMEAR(SSMR), FOR PROVIDER SLIDE REVIEW

## 2020-09-03 LAB — LACTATE DEHYDROGENASE: LDH: 194 U/L — ABNORMAL HIGH (ref 98–192)

## 2020-09-03 NOTE — Progress Notes (Signed)
Hematology and Oncology Follow Up Visit  Jason [name] [id number] 1946/12/04 74 y.o. 09/03/2020   Principle Diagnosis:  Chronic phase CML - MMR Paroxysmal atrial fibrillation - has pacemaker   Current Therapy:   [name] 400 mg by mouth daily -- started on 09/2019 Xarelto 20 mg by mouth daily   Interim History:  Jason [name] is here today for follow-up.    He now is back on [name].  He is on 400 mg a day.  We saw him back in May, the BCR/ABL ratio was down to 0.031%.  He is doing okay.  He is having no problems with the [place].  He does feel tired.  He apparently has vitamin B12 deficiency.  His family doctor is given him vitamin B12.  He is not taking the coronavirus vaccine.  He is worried about the side effects.  I talked to him about this.  I told him that he is already on Xarelto so you will not get blood clots.  Hopefully this was what he was worried about.  The vaccine will not affect the CML.  He now has hypothyroidism.  He is most is taking Synthroid.  He has not started the Synthroid yet.  He is worried about the side effects.  I try to convince him that Synthroid is very easy to take and should not interfere with his other medications.  He has had no problems with fever.  He has had no cough or shortness of breath.  Has had no diarrhea.  There is been no leg swelling.  Overall, his performance status is ECOG 1.    Medications:  Allergies as of 09/03/2020   No Known Allergies     Medication List       Accurate as of September 03, 2020 11:41 AM. If you have any questions, ask your nurse or doctor.        acetaminophen 325 MG tablet Commonly known as: TYLENOL Take 650 mg by mouth every 6 (six) hours as needed. For headache.   amiodarone 200 MG tablet Commonly known as: PACERONE TAKE 1/2 TABLET EVERY DAY   amLODipine 5 MG tablet Commonly known as: NORVASC Take 1 tablet (5 mg total) by mouth daily.   atorvastatin 40 MG tablet Commonly known as: LIPITOR Take 1  tablet (40 mg total) by mouth daily.   dicyclomine 20 MG tablet Commonly known as: BENTYL Take 1 tablet (20 mg total) by mouth 2 (two) times daily.   ferrous sulfate 325 (65 FE) MG EC tablet Take 1 tablet (325 mg total) by mouth 2 (two) times daily.   fluticasone 50 MCG/ACT nasal spray Commonly known as: FLONASE Place 2 sprays into both nostrils daily.   imatinib 400 MG tablet Commonly known as: [name] TAKE 1 TABLET (400MG TOTAL) BY MOUTH DAILY. TAKE WITH MEALS AND LARGE GLASS OF WATER.   levothyroxine 25 MCG tablet Commonly known as: SYNTHROID Take 1 tablet (25 mcg total) by mouth daily before breakfast.   losartan 100 MG tablet Commonly known as: COZAAR Take 1 tablet (100 mg total) by mouth daily.   meclizine 25 MG tablet Commonly known as: ANTIVERT TAKE 1 TABLET THREE TIMES DAILY   metoprolol succinate 100 MG 24 hr tablet Commonly known as: Toprol XL Take 1 tablet (100 mg total) by mouth daily.   nitroGLYCERIN 0.4 MG SL tablet Commonly known as: NITROSTAT Place 1 tablet (0.4 mg total) under the tongue every 5 (five) minutes as needed for chest pain.   ofloxacin  0.3 % OTIC solution Commonly known as: Floxin Otic Place 10 drops into both ears daily. For 7 days   omeprazole 40 MG capsule Commonly known as: PRILOSEC TAKE 1 CAPSULE EVERY DAY (NEED OFFICE VISIT FOR GREATER QUANTITIES/REFILLS)   ondansetron 4 MG tablet Commonly known as: ZOFRAN TAKE 1 TABLET EVERY 8 HOURS AS NEEDED FOR NAUSEA AND VOMITING   PROBIOTIC FORMULA PO Take 1 capsule by mouth daily.   Xarelto 20 MG Tabs tablet Generic drug: rivaroxaban TAKE 1 TABLET EVERY DAY WITH SUPPER       Allergies: No Known Allergies  Past Medical History, Surgical history, Social history, and Family History were reviewed and updated.  Review of Systems: Review of Systems  Constitutional: Negative.   HENT: Negative.   Eyes: Negative.   Respiratory: Negative.   Cardiovascular: Negative.    Gastrointestinal: Negative.   Genitourinary: Negative.   Musculoskeletal: Negative.   Skin: Negative.   Neurological: Negative.   Endo/Heme/Allergies: Negative.   Psychiatric/Behavioral: Negative.      Physical Exam:  weight is 197 lb (89.4 kg). His oral temperature is 98.2 F (36.8 C). His blood pressure is 144/74 (abnormal) and his pulse is 91. His respiration is 18 and oxygen saturation is 96%.   Wt Readings from Last 3 Encounters:  09/03/20 197 lb (89.4 kg)  08/25/20 195 lb (88.5 kg)  08/13/20 195 lb (88.5 kg)    Physical Exam Vitals reviewed.  HENT:     Head: Normocephalic and atraumatic.  Eyes:     Pupils: Pupils are equal, round, and reactive to light.  Cardiovascular:     Rate and Rhythm: Normal rate and regular rhythm.     Heart sounds: Normal heart sounds.  Pulmonary:     Effort: Pulmonary effort is normal.     Breath sounds: Normal breath sounds.  Abdominal:     General: Bowel sounds are normal.     Palpations: Abdomen is soft.  Musculoskeletal:        General: No tenderness or deformity. Normal range of motion.     Cervical back: Normal range of motion.  Lymphadenopathy:     Cervical: No cervical adenopathy.  Skin:    General: Skin is warm and dry.     Findings: No erythema or rash.  Neurological:     Mental Status: He is alert and oriented to person, place, and time.  Psychiatric:        Behavior: Behavior normal.        Thought Content: Thought content normal.        Judgment: Judgment normal.      Lab Results  Component Value Date   WBC 8.3 09/03/2020   HGB 11.6 (L) 09/03/2020   HCT 36.0 (L) 09/03/2020   MCV 94.0 09/03/2020   PLT 248 09/03/2020   Lab Results  Component Value Date   IRON 84 05/27/2015   TIBC 359 05/27/2015   UIBC 275 05/27/2015   IRONPCTSAT 23 05/27/2015   Lab Results  Component Value Date   RETICCTPCT 1.2 05/27/2015   RBC 3.83 (L) 09/03/2020   RETICCTABS 48.2 05/27/2015   No results found for: KPAFRELGTCHN,  LAMBDASER, KAPLAMBRATIO No results found for: IGGSERUM, IGA, IGMSERUM No results found for: Ronnald Ramp, A1GS, A2GS, Violet Baldy, MSPIKE, SPEI   Chemistry      Component Value Date/Time   NA 146 (H) 09/03/2020 0949   NA 141 12/13/2019 0955   NA 143 11/26/2016 1030   K 3.7 09/03/2020 0949  K 3.8 11/26/2016 1030   CL 110 09/03/2020 0949   CL 109 (H) 03/31/2015 1119   CO2 28 09/03/2020 0949   CO2 25 11/26/2016 1030   BUN 8 09/03/2020 0949   BUN 13 12/13/2019 0955   BUN 5.1 (L) 11/26/2016 1030   CREATININE 1.15 09/03/2020 0949   CREATININE 1.0 11/26/2016 1030      Component Value Date/Time   CALCIUM 8.9 09/03/2020 0949   CALCIUM 9.3 11/26/2016 1030   ALKPHOS 68 09/03/2020 0949   ALKPHOS 106 11/26/2016 1030   AST 13 (L) 09/03/2020 0949   AST 16 11/26/2016 1030   ALT 7 09/03/2020 0949   ALT 10 11/26/2016 1030   BILITOT 0.4 09/03/2020 0949   BILITOT 0.53 11/26/2016 1030      Impression and Plan: Jason [name] is a very pleasant 74 yo caucasian male with chronic phase CML.  He was only off [place] for about 4 months before he recurred.  Hopefully, we will be able to get him back into remission with [name].  I feel confident that he will do well on [name].  We will plan to get him back in another 4 months.    Volanda Napoleon, MD 9/8/202111:41 AM

## 2020-09-03 NOTE — Telephone Encounter (Signed)
I called and spoke with patient regarding appointments scheudled for 12/2020 per 9/8 los.  He was ok with both date/time

## 2020-09-10 LAB — BCR/ABL

## 2020-09-11 ENCOUNTER — Ambulatory Visit (INDEPENDENT_AMBULATORY_CARE_PROVIDER_SITE_OTHER): Payer: Medicare Other | Admitting: Family Medicine

## 2020-09-11 ENCOUNTER — Other Ambulatory Visit: Payer: Self-pay

## 2020-09-11 VITALS — BP 132/78 | HR 70 | Temp 97.9°F | Resp 13 | Ht 68.0 in | Wt 193.0 lb

## 2020-09-11 DIAGNOSIS — R0602 Shortness of breath: Secondary | ICD-10-CM | POA: Diagnosis not present

## 2020-09-11 DIAGNOSIS — E039 Hypothyroidism, unspecified: Secondary | ICD-10-CM

## 2020-09-11 DIAGNOSIS — N289 Disorder of kidney and ureter, unspecified: Secondary | ICD-10-CM | POA: Diagnosis not present

## 2020-09-11 DIAGNOSIS — I10 Essential (primary) hypertension: Secondary | ICD-10-CM | POA: Diagnosis not present

## 2020-09-11 DIAGNOSIS — Z87891 Personal history of nicotine dependence: Secondary | ICD-10-CM

## 2020-09-11 DIAGNOSIS — E538 Deficiency of other specified B group vitamins: Secondary | ICD-10-CM

## 2020-09-11 DIAGNOSIS — E782 Mixed hyperlipidemia: Secondary | ICD-10-CM

## 2020-09-11 DIAGNOSIS — Z23 Encounter for immunization: Secondary | ICD-10-CM

## 2020-09-11 DIAGNOSIS — C9210 Chronic myeloid leukemia, BCR/ABL-positive, not having achieved remission: Secondary | ICD-10-CM

## 2020-09-11 DIAGNOSIS — R42 Dizziness and giddiness: Secondary | ICD-10-CM

## 2020-09-11 MED ORDER — CYANOCOBALAMIN 1000 MCG/ML IJ SOLN
1000.0000 ug | Freq: Once | INTRAMUSCULAR | Status: AC
  Administered 2020-09-11: 1000 ug via INTRAMUSCULAR

## 2020-09-11 NOTE — Assessment & Plan Note (Signed)
Encouraged heart healthy diet, increase exercise, avoid trans fats, consider a krill oil cap daily 

## 2020-09-11 NOTE — Progress Notes (Signed)
Subjective:    Patient ID: Jason [name], male    DOB: [birth year], 74 y.o.   MRN: [id number]  Chief Complaint  Patient presents with  . 6 month followup    HPI Patient is in today for chronic medical concerns. No recent febrile illness or hospitalizations. He has been seen by oncology and cardiology recently. He has had some SOB with exertion especially on stairs. No signs of acute illness. No associated symptoms. He has noted some episodes of feeling mildly woozy or light headed upon arising in the morning but no syncope or falls as a result. Denies CP/palp/HA/congestion/fevers/GI or GU c/o. Taking meds as prescribed  Past Medical History:  Diagnosis Date  . Anemia 05/08/2015  . CML (chronic myelocytic leukemia) ([place]) 12/16/2011  . Coronary heart disease   . CVA (cerebrovascular accident) ([place])   . GERD (gastroesophageal reflux disease)   . Grief reaction 03/18/2014  . H/O tobacco use, presenting hazards to health 02/24/2012   No cigarettes since pacemaker placement. Encouraged ongoing efforts   . History of chicken pox   . History of kidney stones   . Hyperlipidemia   . Hypertension   . IBS (irritable bowel syndrome) 09/20/2013  . Medicare annual wellness visit, subsequent 03/23/2014  . PAF (paroxysmal atrial fibrillation) ([place])   . Symptomatic bradycardia    a. symptomatic bradycardia due to sinus node dysfunction s/p RA and RV Medtronic pacemaker 04/15/16    Past Surgical History:  Procedure Laterality Date  . CATARACT EXTRACTION    . CHOLECYSTECTOMY  2003  . CORONARY ANGIOPLASTY WITH STENT PLACEMENT  2000  . EP IMPLANTABLE DEVICE N/A 04/15/2016   Procedure: Pacemaker Implant;  Surgeon: Evans Lance, MD; Medtronic (serial number [id number]) pacemaker; Laterality: Left    Family History  Problem Relation Age of Onset  . Arthritis Mother   . Hypertension Mother   . Heart disease Mother        Rheumatic fever  . Rheumatic fever Mother   . Other Mother        brain  tumor  . Cancer Mother        leukemia  . Hypertension Father   . Alcohol abuse Father   . Diabetes Son   . Colon cancer Neg Hx   . Breast cancer Neg Hx   . Prostate cancer Neg Hx     Social History   Socioeconomic History  . Marital status: Widowed    Spouse name: Not on file  . Number of children: 3  . Years of education: Not on file  . Highest education level: Not on file  Occupational History  . Occupation: Retired    Comment: Retired  Immunologist  . Smoking status: Former Smoker    Packs/day: 0.25    Years: 40.00    Pack years: 10.00    Types: Cigarettes    Start date: 10/30/1967    Quit date: 04/14/2016    Years since quitting: 4.4  . Smokeless tobacco: Never Used  . Tobacco comment: 04-16 quit smoking  Vaping Use  . Vaping Use: Never used  Substance and Sexual Activity  . Alcohol use: No    Alcohol/week: 0.0 standard drinks    Comment: Rare  . Drug use: No  . Sexual activity: Never    Comment: wife died in 01/12/23 married 49 years, grandson livew with patient  Other Topics Concern  . Not on file  Social History Narrative  . Not on file   Social Determinants  of Health   Financial Resource Strain: Low Risk   . Difficulty of Paying Living Expenses: Not hard at all  Food Insecurity: No Food Insecurity  . Worried About Charity fundraiser in the Last Year: Never true  . Ran Out of Food in the Last Year: Never true  Transportation Needs: No Transportation Needs  . Lack of Transportation (Medical): No  . Lack of Transportation (Non-Medical): No  Physical Activity:   . Days of Exercise per Week: Not on file  . Minutes of Exercise per Session: Not on file  Stress:   . Feeling of Stress : Not on file  Social Connections:   . Frequency of Communication with Friends and Family: Not on file  . Frequency of Social Gatherings with Friends and Family: Not on file  . Attends Religious Services: Not on file  . Active Member of Clubs or Organizations: Not on file  .  Attends Archivist Meetings: Not on file  . Marital Status: Not on file  Intimate Partner Violence:   . Fear of Current or Ex-Partner: Not on file  . Emotionally Abused: Not on file  . Physically Abused: Not on file  . Sexually Abused: Not on file    Outpatient Medications Prior to Visit  Medication Sig Dispense Refill  . acetaminophen (TYLENOL) 325 MG tablet Take 650 mg by mouth every 6 (six) hours as needed. For headache.    Marland Kitchen amiodarone (PACERONE) 200 MG tablet TAKE 1/2 TABLET EVERY DAY 45 tablet 2  . amLODipine (NORVASC) 5 MG tablet Take 1 tablet (5 mg total) by mouth daily. 90 tablet 2  . atorvastatin (LIPITOR) 40 MG tablet Take 1 tablet (40 mg total) by mouth daily. 90 tablet 2  . dicyclomine (BENTYL) 20 MG tablet Take 1 tablet (20 mg total) by mouth 2 (two) times daily. 20 tablet 0  . ferrous sulfate 325 (65 FE) MG EC tablet Take 1 tablet (325 mg total) by mouth 2 (two) times daily. 60 tablet 11  . fluticasone (FLONASE) 50 MCG/ACT nasal spray Place 2 sprays into both nostrils daily. 48 g 3  . imatinib ([name]) 400 MG tablet TAKE 1 TABLET (400MG TOTAL) BY MOUTH DAILY. TAKE WITH MEALS AND LARGE GLASS OF WATER. 30 tablet 6  . levothyroxine (SYNTHROID) 25 MCG tablet Take 1 tablet (25 mcg total) by mouth daily before breakfast. 90 tablet 3  . losartan (COZAAR) 100 MG tablet Take 1 tablet (100 mg total) by mouth daily. 90 tablet 1  . meclizine (ANTIVERT) 25 MG tablet TAKE 1 TABLET THREE TIMES DAILY 90 tablet 0  . metoprolol succinate (TOPROL XL) 100 MG 24 hr tablet Take 1 tablet (100 mg total) by mouth daily. 90 tablet 3  . nitroGLYCERIN (NITROSTAT) 0.4 MG SL tablet Place 1 tablet (0.4 mg total) under the tongue every 5 (five) minutes as needed for chest pain. 25 tablet 1  . ofloxacin (FLOXIN OTIC) 0.3 % OTIC solution Place 10 drops into both ears daily. For 7 days 10 mL 0  . omeprazole (PRILOSEC) 40 MG capsule TAKE 1 CAPSULE EVERY DAY (NEED OFFICE VISIT FOR GREATER  QUANTITIES/REFILLS) 90 capsule 0  . ondansetron (ZOFRAN) 4 MG tablet TAKE 1 TABLET EVERY 8 HOURS AS NEEDED FOR NAUSEA AND VOMITING 40 tablet 1  . Probiotic Product (PROBIOTIC FORMULA PO) Take 1 capsule by mouth daily.     [name] 20 MG TABS tablet TAKE 1 TABLET EVERY DAY WITH SUPPER 90 tablet 1   Facility-Administered  Medications Prior to Visit  Medication Dose Route Frequency Provider Last Rate Last Admin  . cyanocobalamin ((VITAMIN B-12)) injection 1,000 mcg  1,000 mcg Intramuscular Q30 days Mosie Lukes, MD   1,000 mcg at 07/15/20 8119    No Known Allergies  Review of Systems  Constitutional: Negative for fever and malaise/fatigue.  HENT: Negative for congestion.   Eyes: Negative for blurred vision.  Respiratory: Negative for shortness of breath.   Cardiovascular: Negative for chest pain, palpitations and leg swelling.  Gastrointestinal: Negative for abdominal pain, blood in stool and nausea.  Genitourinary: Negative for dysuria and frequency.  Musculoskeletal: Negative for falls.  Skin: Negative for rash.  Neurological: Negative for dizziness, loss of consciousness and headaches.  Endo/Heme/Allergies: Negative for environmental allergies.  Psychiatric/Behavioral: Negative for depression. The patient is not nervous/anxious.        Objective:    Physical Exam Vitals and nursing note reviewed.  Constitutional:      General: He is not in acute distress.    Appearance: He is well-developed.  HENT:     Head: Normocephalic and atraumatic.     Nose: Nose normal.  Eyes:     General:        Right eye: No discharge.        Left eye: No discharge.  Cardiovascular:     Rate and Rhythm: Normal rate. Rhythm irregular.     Heart sounds: No murmur heard.   Pulmonary:     Effort: Pulmonary effort is normal.     Breath sounds: Normal breath sounds.  Abdominal:     General: Bowel sounds are normal.     Palpations: Abdomen is soft.     Tenderness: There is no abdominal  tenderness.  Musculoskeletal:     Cervical back: Normal range of motion and neck supple.  Skin:    General: Skin is warm and dry.  Neurological:     Mental Status: He is alert and oriented to person, place, and time.     BP 132/78 (BP Location: Right Arm, Patient Position: Sitting, Cuff Size: Small)   Pulse 70   Temp 97.9 F (36.6 C) (Oral)   Resp 13   Ht _0  (1.727 m)   Wt 193 lb (87.5 kg)   SpO2 95%   BMI 29.35 kg/m  Wt Readings from Last 3 Encounters:  09/11/20 193 lb (87.5 kg)  09/03/20 197 lb (89.4 kg)  08/25/20 195 lb (88.5 kg)    Diabetic Foot Exam - Simple   No data filed     Lab Results  Component Value Date   WBC 8.3 09/03/2020   HGB 11.6 (L) 09/03/2020   HCT 36.0 (L) 09/03/2020   PLT 248 09/03/2020   GLUCOSE 99 09/03/2020   CHOL 113 03/06/2020   TRIG 71.0 03/06/2020   HDL 42.90 03/06/2020   LDLCALC 56 03/06/2020   ALT 7 09/03/2020   AST 13 (L) 09/03/2020   NA 146 (H) 09/03/2020   K 3.7 09/03/2020   CL 110 09/03/2020   CREATININE 1.15 09/03/2020   BUN 8 09/03/2020   CO2 28 09/03/2020   TSH 9.240 (H) 08/13/2020   PSA 0.98 09/22/2018   INR 1.07 06/27/2014    Lab Results  Component Value Date   TSH 9.240 (H) 08/13/2020   Lab Results  Component Value Date   WBC 8.3 09/03/2020   HGB 11.6 (L) 09/03/2020   HCT 36.0 (L) 09/03/2020   MCV 94.0 09/03/2020   PLT 248 09/03/2020  Lab Results  Component Value Date   NA 146 (H) 09/03/2020   K 3.7 09/03/2020   CHLORIDE 108 11/26/2016   CO2 28 09/03/2020   GLUCOSE 99 09/03/2020   BUN 8 09/03/2020   CREATININE 1.15 09/03/2020   BILITOT 0.4 09/03/2020   ALKPHOS 68 09/03/2020   AST 13 (L) 09/03/2020   ALT 7 09/03/2020   PROT 6.6 09/03/2020   ALBUMIN 3.9 09/03/2020   CALCIUM 8.9 09/03/2020   ANIONGAP 8 09/03/2020   EGFR 74 (L) 11/26/2016   GFR 67.21 03/06/2020   Lab Results  Component Value Date   CHOL 113 03/06/2020   Lab Results  Component Value Date   HDL 42.90 03/06/2020   Lab  Results  Component Value Date   LDLCALC 56 03/06/2020   Lab Results  Component Value Date   TRIG 71.0 03/06/2020   Lab Results  Component Value Date   CHOLHDL 3 03/06/2020   No results found for: HGBA1C     Assessment & Plan:   Problem List Items Addressed This Visit    CML (chronic myelocytic leukemia) ([place]) (Chronic)    Following with hem/onc and doing well, has seen them recently      Hyperlipidemia, mixed    Encouraged heart healthy diet, increase exercise, avoid trans fats, consider a krill oil cap daily      Dizziness    Has had an episode of feeling woozy while sitting on his cough and then he felt a little sweaty. It has happened only twice since last visit. He has discussed with cardiology and has not had any recent or further episodes. He is encouraged to hydrate well and eat small frequent meals always with a lean protein/healthy fat.       Hypertension    Well controlled, no changes to meds. Encouraged heart healthy diet such as the DASH diet and exercise as tolerated.       Renal insufficiency    Hydrate and monitor      Vitamin B12 deficiency    Supplement and monitor      Hypothyroidism    Has been started on Levothyroxine         I am having Elyn Aquas. Levey maintain his Probiotic Product (PROBIOTIC FORMULA PO), acetaminophen, ferrous sulfate, dicyclomine, ofloxacin, ondansetron, meclizine, atorvastatin, nitroGLYCERIN, amLODipine, fluticasone, amiodarone, Xarelto, losartan, omeprazole, metoprolol succinate, levothyroxine, and imatinib. We will continue to administer cyanocobalamin.  No orders of the defined types were placed in this encounter.    Penni Homans, MD

## 2020-09-11 NOTE — Assessment & Plan Note (Signed)
Has been started on Levothyroxine

## 2020-09-11 NOTE — Assessment & Plan Note (Signed)
Supplement and monitor 

## 2020-09-11 NOTE — Assessment & Plan Note (Addendum)
Has had an episode of feeling woozy while sitting on his cough and then he felt a little sweaty. It has happened only twice since last visit. He has discussed with cardiology and has not had any recent or further episodes. He is encouraged to hydrate well and eat small frequent meals always with a lean protein/healthy fat.

## 2020-09-11 NOTE — Assessment & Plan Note (Signed)
He quit smoking for good 2018, he is referreal

## 2020-09-11 NOTE — Assessment & Plan Note (Signed)
Following with hem/onc and doing well, has seen them recently

## 2020-09-11 NOTE — Patient Instructions (Addendum)
Benefiber powder daily with 60 to 80 ounces daily   This is from Vitamin B 12 deficiency Pernicious Anemia  Anemia is a condition in which the body does not have enough red blood cells or hemoglobin. Hemoglobin is a substance in red blood cells that carries oxygen. Pernicious anemia happens when the body does not have enough of a protein made in the stomach called intrinsic factor (IF). Without enough IF, your digestive tract cannot absorb enough of the vitamin B12 that your body needs to make red blood cells. Normally, you can get enough vitamin B12 from eating foods such as meat, poultry, eggs, and dairy products. If you have pernicious anemia, you do not absorb enough vitamin B12 from your diet, and anemia develops over time. When you have anemia, your organs may not work properly and you may feel very tired. Untreated pernicious anemia can lead to severe symptoms of anemia, including chest pain, heart failure, and permanent nervous system damage. What are the causes? The cause of this condition is not known. Pernicious anemia is believed to be an autoimmune disease. When you have an autoimmune disease, your body's defense system (immune system) mistakenly attacks normal cells in your body. With pernicious anemia, the immune system attacks cells that line the inside of the stomach (parietal cells). These cells secrete stomach acids and IF. What increases the risk? You are more likely to develop this condition if you:  Are older than age 42.  Have a family history of pernicious anemia.  Are of [place] or [place] descent.  Have another type of autoimmune disease. What are the signs or symptoms? Pernicious anemia symptoms may take many years to develop. This condition usually does not cause symptoms until after a person is older than age 50. Signs and symptoms due to anemia include:  Tiredness.  Light-headedness.  A sore tongue or a burning sensation on the tongue.  A  smooth red tongue.  Shortness of breath.  Fast heartbeat.  Chest pain. Stomach symptoms due to gradual loss of parietal cells include:  Nausea or vomiting.  Heartburn.  Weight loss without trying.  Diarrhea. Signs and symptoms due to nervous system damage include:  Dizziness.  Being unsteady while walking.  Tingling or numbness of hands and feet.  Irritability.  Depression.  Hallucinations or delusions.  Loss of smell. How is this diagnosed? In many cases, anemia may be found after you have a routine blood test. This condition may also be diagnosed based on your symptoms, having a family history of the condition, and a physical exam. You may also have tests to confirm the diagnosis. These may include blood tests to:  Count the different types of blood cells (complete blood count or CBC).  Test for different types of anemia.  Check for proteins made by your immune system (antibodies) that attack parietal cells. Almost all people with pernicious anemia have these antibodies.  Check your B12 level. How is this treated? This condition may be treated with vitamin B12 replacement. This may include:  Injections of vitamin B12. This is the most common treatment. You will also have your blood level of B12 checked regularly. After you have a normal level of vitamin B12, and the anemia has been corrected, the injections can be given about once a month.  Taking a pill by mouth that contains a large dose of vitamin B12.  Using a spray that you breathe in through your nose (nasal spray). A vitamin B12 nasal spray may be used  to treat people who are not able to swallow supplements.  Long-term monitoring and regular visits to a health care provider. If the condition is detected and treatment is started in the early stages, most people do not develop complications. Treatment reverses the condition and prevents future anemia, but it must be continued for life. Having pernicious  anemia also puts you at higher risk for stomach cancer. Follow these instructions at home:  Take over-the-counter and prescription medicines only as told by your health care provider.  Return to your normal activities as told by your health care provider. Ask your health care provider what activities are safe for you.  Keep all follow-up visits as told by your health care provider. This is important. Contact a health care provider if:  You develop new symptoms.  Your symptoms return or get worse after treatment.  You have stomach pain.  You feel full after eating a small meal.  You have trouble swallowing. Get help right away if:  You have chest pain.  You have a rapid heartbeat.  You have dizziness or you faint.  You have trouble breathing.  You have a feeling as if your heart is skipping beats or fluttering (palpitations).  You have pain, swelling, or redness in an arm or leg.  You cough up blood. These symptoms may represent a serious problem that is an emergency. Do not wait to see if the symptoms will go away. Get medical help right away. Call your local emergency services (911 in the [place] Do not drive yourself to the hospital. Summary  Pernicious anemia happens when your body cannot make enough red blood cells due to vitamin B12 deficiency.  This condition is usually caused by an autoimmune disease that attacks stomach cells that produce intrinsic factor (IF). You need IF to absorb vitamin B12.  Pernicious anemia symptoms may take many years to develop.  This condition is sometimes discovered during routine blood testing.  Treatment of this condition includes vitamin B12 replacement for life. This information is not intended to replace advice given to you by your health care provider. Make sure you discuss any questions you have with your health care provider. Document Revised: 05/28/2019 Document Reviewed: 03/29/2019 Elsevier Patient Education  [place].

## 2020-09-11 NOTE — Assessment & Plan Note (Signed)
Hydrate and monitor 

## 2020-09-11 NOTE — Assessment & Plan Note (Signed)
Well controlled, no changes to meds. Encouraged heart healthy diet such as the DASH diet and exercise as tolerated.  °

## 2020-09-12 LAB — LIPID PANEL
Cholesterol: 108 mg/dL (ref <200)
HDL: 33 mg/dL — ABNORMAL LOW (ref >=40)
LDL Cholesterol (Calc): 59 mg/dL (calc)
Non-HDL Cholesterol (Calc): 75 mg/dL (calc) (ref <130)
Total CHOL/HDL Ratio: 3.3 (calc) (ref <5.0)
Triglycerides: 78 mg/dL (ref <150)

## 2020-09-12 LAB — COMPREHENSIVE METABOLIC PANEL
AG Ratio: 1.4 (calc) (ref 1.0–2.5)
ALT: 7 U/L — ABNORMAL LOW (ref 9–46)
AST: 13 U/L (ref 10–35)
Albumin: 3.7 g/dL (ref 3.6–5.1)
Alkaline phosphatase (APISO): 78 U/L (ref 35–144)
BUN/Creatinine Ratio: 6 (calc) (ref 6–22)
BUN: 7 mg/dL (ref 7–25)
CO2: 26 mmol/L (ref 20–32)
Calcium: 8.5 mg/dL — ABNORMAL LOW (ref 8.6–10.3)
Chloride: 109 mmol/L (ref 98–110)
Creat: 1.23 mg/dL — ABNORMAL HIGH (ref 0.70–1.18)
Globulin: 2.6 g/dL (calc) (ref 1.9–3.7)
Glucose, Bld: 89 mg/dL (ref 65–99)
Potassium: 4.1 mmol/L (ref 3.5–5.3)
Sodium: 145 mmol/L (ref 135–146)
Total Bilirubin: 0.4 mg/dL (ref 0.2–1.2)
Total Protein: 6.3 g/dL (ref 6.1–8.1)

## 2020-09-12 LAB — CBC WITH DIFFERENTIAL/PLATELET
Absolute Monocytes: 754 cells/uL (ref 200–950)
Basophils Absolute: 131 cells/uL (ref 0–200)
Basophils Relative: 1.6 %
Eosinophils Absolute: 385 cells/uL (ref 15–500)
Eosinophils Relative: 4.7 %
HCT: 37.7 % — ABNORMAL LOW (ref 38.5–50.0)
Hemoglobin: 12.2 g/dL — ABNORMAL LOW (ref 13.2–17.1)
Lymphs Abs: 1574 cells/uL (ref 850–3900)
MCH: 30 pg (ref 27.0–33.0)
MCHC: 32.4 g/dL (ref 32.0–36.0)
MCV: 92.6 fL (ref 80.0–100.0)
MPV: 10.7 fL (ref 7.5–12.5)
Monocytes Relative: 9.2 %
Neutro Abs: 5355 cells/uL (ref 1500–7800)
Neutrophils Relative %: 65.3 %
Platelets: 282 10*3/uL (ref 140–400)
RBC: 4.07 10*6/uL — ABNORMAL LOW (ref 4.20–5.80)
RDW: 12.9 % (ref 11.0–15.0)
Total Lymphocyte: 19.2 %
WBC: 8.2 10*3/uL (ref 3.8–10.8)

## 2020-09-12 LAB — VITAMIN B12: Vitamin B-12: >2000 pg/mL — ABNORMAL HIGH (ref 200–1100)

## 2020-09-17 ENCOUNTER — Ambulatory Visit (HOSPITAL_BASED_OUTPATIENT_CLINIC_OR_DEPARTMENT_OTHER)
Admission: RE | Admit: 2020-09-17 | Discharge: 2020-09-17 | Disposition: A | Discharge status: Home / Self Care | Payer: Medicare Other | Source: Ambulatory Visit | Attending: Cardiology | Admitting: Cardiology

## 2020-09-17 ENCOUNTER — Other Ambulatory Visit: Payer: Self-pay

## 2020-09-17 DIAGNOSIS — I351 Nonrheumatic aortic (valve) insufficiency: Secondary | ICD-10-CM | POA: Insufficient documentation

## 2020-09-17 DIAGNOSIS — I714 Abdominal aortic aneurysm, without rupture, unspecified: Secondary | ICD-10-CM

## 2020-09-17 LAB — ECHOCARDIOGRAM COMPLETE
Area-P 1/2: 3.85 cm2
P 1/2 time: 472 msec
S' Lateral: 4.08 cm
Single Plane A4C EF: 40.3 %

## 2020-09-19 ENCOUNTER — Encounter: Payer: Self-pay | Admitting: Cardiology

## 2020-09-19 ENCOUNTER — Other Ambulatory Visit: Payer: Self-pay | Admitting: *Deleted

## 2020-09-19 DIAGNOSIS — I351 Nonrheumatic aortic (valve) insufficiency: Secondary | ICD-10-CM

## 2020-09-19 NOTE — Telephone Encounter (Signed)
This encounter was created in error - please disregard.

## 2020-09-19 NOTE — Telephone Encounter (Signed)
Follow Up:      Returning your call from today, concerning his Echo results.

## 2020-09-24 ENCOUNTER — Encounter: Payer: Self-pay | Admitting: *Deleted

## 2020-09-25 ENCOUNTER — Encounter: Payer: Self-pay | Admitting: *Deleted

## 2020-09-29 ENCOUNTER — Ambulatory Visit (INDEPENDENT_AMBULATORY_CARE_PROVIDER_SITE_OTHER): Payer: Medicare Other

## 2020-09-29 DIAGNOSIS — I495 Sick sinus syndrome: Secondary | ICD-10-CM

## 2020-09-30 ENCOUNTER — Telehealth: Payer: Self-pay

## 2020-09-30 NOTE — Telephone Encounter (Signed)
Pt called today to update his pharmacy phone and fax numbers.  Pt now has [facility] Medicare.  Phone number for pharmacy is [phone] and fax number is [phone].

## 2020-10-01 LAB — CUP PACEART REMOTE DEVICE CHECK
Battery Remaining Longevity: 69 mo
Battery Voltage: 3 V
Brady Statistic AP VP Percent: 0.73 %
Brady Statistic AP VS Percent: 93.12 %
Brady Statistic AS VP Percent: 0.2 %
Brady Statistic AS VS Percent: 5.94 %
Brady Statistic RA Percent Paced: 88.32 %
Brady Statistic RV Percent Paced: 0.99 %
Date Time Interrogation Session: 20211005124113
Implantable Lead Implant Date: 20170420
Implantable Lead Implant Date: 20170420
Implantable Lead Location: 753859
Implantable Lead Location: 753860
Implantable Lead Model: 5076
Implantable Lead Model: 5076
Implantable Pulse Generator Implant Date: 20170420
Lead Channel Impedance Value: 380 Ohm
Lead Channel Impedance Value: 380 Ohm
Lead Channel Impedance Value: 418 Ohm
Lead Channel Impedance Value: 475 Ohm
Lead Channel Pacing Threshold Amplitude: 0.625 V
Lead Channel Pacing Threshold Amplitude: 0.75 V
Lead Channel Pacing Threshold Pulse Width: 0.4 ms
Lead Channel Pacing Threshold Pulse Width: 0.4 ms
Lead Channel Sensing Intrinsic Amplitude: 4 mV
Lead Channel Sensing Intrinsic Amplitude: 4 mV
Lead Channel Sensing Intrinsic Amplitude: 9.75 mV
Lead Channel Sensing Intrinsic Amplitude: 9.75 mV
Lead Channel Setting Pacing Amplitude: 2 V
Lead Channel Setting Pacing Amplitude: 2.5 V
Lead Channel Setting Pacing Pulse Width: 0.4 ms
Lead Channel Setting Sensing Sensitivity: 2 mV

## 2020-10-01 NOTE — Telephone Encounter (Signed)
Pharmacy updated.

## 2020-10-02 NOTE — Progress Notes (Signed)
Remote pacemaker transmission.   

## 2020-10-03 ENCOUNTER — Encounter: Payer: Self-pay | Admitting: *Deleted

## 2020-10-06 ENCOUNTER — Ambulatory Visit: Payer: Self-pay | Admitting: *Deleted

## 2020-10-09 ENCOUNTER — Encounter: Payer: Self-pay | Admitting: *Deleted

## 2020-10-14 ENCOUNTER — Ambulatory Visit (INDEPENDENT_AMBULATORY_CARE_PROVIDER_SITE_OTHER): Payer: Medicare Other

## 2020-10-14 ENCOUNTER — Other Ambulatory Visit: Payer: Self-pay

## 2020-10-14 DIAGNOSIS — E538 Deficiency of other specified B group vitamins: Secondary | ICD-10-CM | POA: Diagnosis not present

## 2020-10-14 MED ORDER — ONDANSETRON HCL 4 MG PO TABS: 4.0000 mg | ORAL_TABLET | Freq: Two times a day (BID) | ORAL | 1 refills | Status: DC | PRN

## 2020-10-14 MED ORDER — CYANOCOBALAMIN 1000 MCG/ML IJ SOLN
1000.0000 ug | Freq: Once | INTRAMUSCULAR | Status: AC
  Administered 2020-10-14: 1000 ug via INTRAMUSCULAR

## 2020-10-14 NOTE — Telephone Encounter (Signed)
I have sent in the Zofran and he can stop the shots now. His Intrinsic Factor was negative so he can switch to sublingual vitamin B12 500 mg daily and recheck labs in 3 months. If he is still feeling poorly then he should probably have at least a virtual visit sooner than March so we can discuss next steps and how to help him

## 2020-10-14 NOTE — Telephone Encounter (Signed)
Patient came in today for B12 injection. He states he still is not feeling any difference since starting the injections. He first received b12 injections on 03/06/2020 for low vitamin b12 level.  Please advise if he is to continue, how long if so or if there is anything else he can do?   He also states he has taken the zofran you prescribed in 2020. He took his last pill last night for nausea. He states he woke up feeling so much better and is requesting a refill as he is out. I have pended the medication to fill if appropriate.

## 2020-10-14 NOTE — Progress Notes (Signed)
Pt here for monthly B12 injection per [name], MD   B12 1036mcg given IM left deltoid, and pt tolerated injection well.  Next B12 injection scheduled for next month 11/11/20 @ 10:15 am

## 2020-10-15 ENCOUNTER — Other Ambulatory Visit: Payer: Self-pay | Admitting: *Deleted

## 2020-10-15 DIAGNOSIS — E538 Deficiency of other specified B group vitamins: Secondary | ICD-10-CM

## 2020-10-15 NOTE — Telephone Encounter (Signed)
Patient notified.  Lab only appointment made and future order placed. 

## 2020-10-15 NOTE — Telephone Encounter (Signed)
Left message on machine to call back  

## 2020-11-11 ENCOUNTER — Ambulatory Visit: Payer: Medicare Other

## 2020-12-29 ENCOUNTER — Ambulatory Visit (INDEPENDENT_AMBULATORY_CARE_PROVIDER_SITE_OTHER): Payer: Medicare Other

## 2020-12-29 DIAGNOSIS — I495 Sick sinus syndrome: Secondary | ICD-10-CM | POA: Diagnosis not present

## 2020-12-30 LAB — CUP PACEART REMOTE DEVICE CHECK
Battery Remaining Longevity: 68 mo
Battery Voltage: 3 V
Brady Statistic AP VP Percent: 0.46 %
Brady Statistic AP VS Percent: 85.91 %
Brady Statistic AS VP Percent: 0.06 %
Brady Statistic AS VS Percent: 13.57 %
Brady Statistic RA Percent Paced: 78.47 %
Brady Statistic RV Percent Paced: 0.57 %
Date Time Interrogation Session: 20220103104016
Implantable Lead Implant Date: 20170420
Implantable Lead Implant Date: 20170420
Implantable Lead Location: 753859
Implantable Lead Location: 753860
Implantable Lead Model: 5076
Implantable Lead Model: 5076
Implantable Pulse Generator Implant Date: 20170420
Lead Channel Impedance Value: 323 Ohm
Lead Channel Impedance Value: 323 Ohm
Lead Channel Impedance Value: 380 Ohm
Lead Channel Impedance Value: 437 Ohm
Lead Channel Pacing Threshold Amplitude: 0.5 V
Lead Channel Pacing Threshold Amplitude: 0.75 V
Lead Channel Pacing Threshold Pulse Width: 0.4 ms
Lead Channel Pacing Threshold Pulse Width: 0.4 ms
Lead Channel Sensing Intrinsic Amplitude: 1.75 mV
Lead Channel Sensing Intrinsic Amplitude: 1.75 mV
Lead Channel Sensing Intrinsic Amplitude: 8.5 mV
Lead Channel Sensing Intrinsic Amplitude: 8.5 mV
Lead Channel Setting Pacing Amplitude: 2 V
Lead Channel Setting Pacing Amplitude: 2.5 V
Lead Channel Setting Pacing Pulse Width: 0.4 ms
Lead Channel Setting Sensing Sensitivity: 2 mV

## 2021-01-02 ENCOUNTER — Other Ambulatory Visit: Payer: Self-pay

## 2021-01-02 ENCOUNTER — Encounter: Payer: Self-pay | Admitting: Hematology & Oncology

## 2021-01-02 ENCOUNTER — Inpatient Hospital Stay (HOSPITAL_BASED_OUTPATIENT_CLINIC_OR_DEPARTMENT_OTHER): Payer: Medicare Other | Admitting: Hematology & Oncology

## 2021-01-02 ENCOUNTER — Inpatient Hospital Stay: Payer: Medicare Other | Attending: Hematology & Oncology

## 2021-01-02 ENCOUNTER — Telehealth: Payer: Self-pay | Admitting: Hematology & Oncology

## 2021-01-02 VITALS — BP 148/63 | HR 88 | Temp 98.0°F | Resp 19 | Wt 185.0 lb

## 2021-01-02 DIAGNOSIS — Z79899 Other long term (current) drug therapy: Secondary | ICD-10-CM | POA: Diagnosis not present

## 2021-01-02 DIAGNOSIS — Z7901 Long term (current) use of anticoagulants: Secondary | ICD-10-CM | POA: Insufficient documentation

## 2021-01-02 DIAGNOSIS — I480 Paroxysmal atrial fibrillation: Secondary | ICD-10-CM | POA: Insufficient documentation

## 2021-01-02 DIAGNOSIS — C9210 Chronic myeloid leukemia, BCR/ABL-positive, not having achieved remission: Secondary | ICD-10-CM

## 2021-01-02 DIAGNOSIS — E039 Hypothyroidism, unspecified: Secondary | ICD-10-CM | POA: Insufficient documentation

## 2021-01-02 DIAGNOSIS — Z950 Presence of cardiac pacemaker: Secondary | ICD-10-CM | POA: Insufficient documentation

## 2021-01-02 DIAGNOSIS — E538 Deficiency of other specified B group vitamins: Secondary | ICD-10-CM | POA: Insufficient documentation

## 2021-01-02 LAB — CMP (CANCER CENTER ONLY)
ALT: 10 U/L (ref 0–44)
AST: 17 U/L (ref 15–41)
Albumin: 3.8 g/dL (ref 3.5–5.0)
Alkaline Phosphatase: 61 U/L (ref 38–126)
Anion gap: 9 (ref 5–15)
BUN: 11 mg/dL (ref 8–23)
CO2: 29 mmol/L (ref 22–32)
Calcium: 8.9 mg/dL (ref 8.9–10.3)
Chloride: 109 mmol/L (ref 98–111)
Creatinine: 1.23 mg/dL (ref 0.61–1.24)
GFR, Estimated: >60 mL/min (ref >60)
Glucose, Bld: 92 mg/dL (ref 70–99)
Potassium: 4.3 mmol/L (ref 3.5–5.1)
Sodium: 147 mmol/L — ABNORMAL HIGH (ref 135–145)
Total Bilirubin: 0.5 mg/dL (ref 0.3–1.2)
Total Protein: 6.5 g/dL (ref 6.5–8.1)

## 2021-01-02 LAB — CBC WITH DIFFERENTIAL (CANCER CENTER ONLY)
Abs Immature Granulocytes: 0.03 10*3/uL (ref 0.00–0.07)
Basophils Absolute: 0.1 10*3/uL (ref 0.0–0.1)
Basophils Relative: 2 %
Eosinophils Absolute: 0.5 10*3/uL (ref 0.0–0.5)
Eosinophils Relative: 6 %
HCT: 37.4 % — ABNORMAL LOW (ref 39.0–52.0)
Hemoglobin: 12.2 g/dL — ABNORMAL LOW (ref 13.0–17.0)
Immature Granulocytes: 0 %
Lymphocytes Relative: 21 %
Lymphs Abs: 1.7 10*3/uL (ref 0.7–4.0)
MCH: 30.7 pg (ref 26.0–34.0)
MCHC: 32.6 g/dL (ref 30.0–36.0)
MCV: 94.2 fL (ref 80.0–100.0)
Monocytes Absolute: 0.8 10*3/uL (ref 0.1–1.0)
Monocytes Relative: 10 %
Neutro Abs: 4.9 10*3/uL (ref 1.7–7.7)
Neutrophils Relative %: 61 %
Platelet Count: 215 10*3/uL (ref 150–400)
RBC: 3.97 MIL/uL — ABNORMAL LOW (ref 4.22–5.81)
RDW: 15.7 % — ABNORMAL HIGH (ref 11.5–15.5)
WBC Count: 7.9 10*3/uL (ref 4.0–10.5)
nRBC: 0 % (ref 0.0–0.2)

## 2021-01-02 LAB — SAVE SMEAR(SSMR), FOR PROVIDER SLIDE REVIEW

## 2021-01-02 LAB — LACTATE DEHYDROGENASE: LDH: 203 U/L — ABNORMAL HIGH (ref 98–192)

## 2021-01-02 NOTE — Progress Notes (Signed)
Hematology and Oncology Follow Up Visit  Jason [name] [id number] December 12, 1946 75 y.o. 01/02/2021   Principle Diagnosis:  Chronic phase CML - MMR Paroxysmal atrial fibrillation - has pacemaker   Current Therapy:   [name] 400 mg by mouth daily -- started on 09/2019 Xarelto 20 mg by mouth daily   Interim History:  Jason [name] is here today for follow-up.    He now is back on [name].  He is on 400 mg a day.  We saw him back in September, the BCR/ABL ratio was down to 0.02%.  He is doing okay.  He is having no problems with the [place].  He does feel tired.  He apparently has vitamin B12 deficiency.  His family doctor is giving him vitamin B12.  He is not taking the coronavirus vaccine.  He is worried about the side effects.  I talked to him about this.  I told him that he is already on Xarelto so you will not get blood clots.  Hopefully this was what he was worried about.  The vaccine will not affect the CML.  He now has hypothyroidism.  He is most is taking Synthroid.  He has not started the Synthroid yet.  He is worried about the side effects.  I try to convince him that Synthroid is very easy to take and should not interfere with his other medications.  He has had no problems with fever.  He has had no cough or shortness of breath.  Has had no diarrhea.  There is been no leg swelling.  Overall, his performance status is ECOG 1.    Medications:  Allergies as of 01/02/2021   No Known Allergies     Medication List       Accurate as of January 02, 2021 11:13 AM. If you have any questions, ask your nurse or doctor.        acetaminophen 325 MG tablet Commonly known as: TYLENOL Take 650 mg by mouth every 6 (six) hours as needed. For headache.   amiodarone 200 MG tablet Commonly known as: PACERONE TAKE 1/2 TABLET EVERY DAY   amLODipine 5 MG tablet Commonly known as: NORVASC Take 1 tablet (5 mg total) by mouth daily.   atorvastatin 40 MG tablet Commonly known as: LIPITOR Take  1 tablet (40 mg total) by mouth daily.   dicyclomine 20 MG tablet Commonly known as: BENTYL Take 1 tablet (20 mg total) by mouth 2 (two) times daily.   ferrous sulfate 325 (65 FE) MG EC tablet Take 1 tablet (325 mg total) by mouth 2 (two) times daily.   fluticasone 50 MCG/ACT nasal spray Commonly known as: FLONASE Place 2 sprays into both nostrils daily.   imatinib 400 MG tablet Commonly known as: [name] TAKE 1 TABLET (400MG TOTAL) BY MOUTH DAILY. TAKE WITH MEALS AND LARGE GLASS OF WATER.   levothyroxine 25 MCG tablet Commonly known as: SYNTHROID Take 1 tablet (25 mcg total) by mouth daily before breakfast.   losartan 100 MG tablet Commonly known as: COZAAR Take 1 tablet (100 mg total) by mouth daily.   meclizine 25 MG tablet Commonly known as: ANTIVERT TAKE 1 TABLET THREE TIMES DAILY   metoprolol succinate 100 MG 24 hr tablet Commonly known as: Toprol XL Take 1 tablet (100 mg total) by mouth daily.   nitroGLYCERIN 0.4 MG SL tablet Commonly known as: NITROSTAT Place 1 tablet (0.4 mg total) under the tongue every 5 (five) minutes as needed for chest pain.   ofloxacin  0.3 % OTIC solution Commonly known as: Floxin Otic Place 10 drops into both ears daily. For 7 days   omeprazole 40 MG capsule Commonly known as: PRILOSEC TAKE 1 CAPSULE EVERY DAY (NEED OFFICE VISIT FOR GREATER QUANTITIES/REFILLS)   ondansetron 4 MG tablet Commonly known as: ZOFRAN Take 1 tablet (4 mg total) by mouth 2 (two) times daily as needed for nausea or vomiting.   PROBIOTIC FORMULA PO Take 1 capsule by mouth daily.   Xarelto 20 MG Tabs tablet Generic drug: rivaroxaban TAKE 1 TABLET EVERY DAY WITH SUPPER       Allergies: No Known Allergies  Past Medical History, Surgical history, Social history, and Family History were reviewed and updated.  Review of Systems: Review of Systems  Constitutional: Negative.   HENT: Negative.   Eyes: Negative.   Respiratory: Negative.    Cardiovascular: Negative.   Gastrointestinal: Negative.   Genitourinary: Negative.   Musculoskeletal: Negative.   Skin: Negative.   Neurological: Negative.   Endo/Heme/Allergies: Negative.   Psychiatric/Behavioral: Negative.      Physical Exam:  weight is 185 lb (83.9 kg). His oral temperature is 98 F (36.7 C). His blood pressure is 148/63 (abnormal) and his pulse is 88. His respiration is 19 and oxygen saturation is 93%.   Wt Readings from Last 3 Encounters:  01/02/21 185 lb (83.9 kg)  09/11/20 193 lb (87.5 kg)  09/03/20 197 lb (89.4 kg)    Physical Exam Vitals reviewed.  HENT:     Head: Normocephalic and atraumatic.  Eyes:     Pupils: Pupils are equal, round, and reactive to light.  Cardiovascular:     Rate and Rhythm: Normal rate and regular rhythm.     Heart sounds: Normal heart sounds.  Pulmonary:     Effort: Pulmonary effort is normal.     Breath sounds: Normal breath sounds.  Abdominal:     General: Bowel sounds are normal.     Palpations: Abdomen is soft.  Musculoskeletal:        General: No tenderness or deformity. Normal range of motion.     Cervical back: Normal range of motion.  Lymphadenopathy:     Cervical: No cervical adenopathy.  Skin:    General: Skin is warm and dry.     Findings: No erythema or rash.  Neurological:     Mental Status: He is alert and oriented to person, place, and time.  Psychiatric:        Behavior: Behavior normal.        Thought Content: Thought content normal.        Judgment: Judgment normal.      Lab Results  Component Value Date   WBC 7.9 01/02/2021   HGB 12.2 (L) 01/02/2021   HCT 37.4 (L) 01/02/2021   MCV 94.2 01/02/2021   PLT 215 01/02/2021   Lab Results  Component Value Date   IRON 84 05/27/2015   TIBC 359 05/27/2015   UIBC 275 05/27/2015   IRONPCTSAT 23 05/27/2015   Lab Results  Component Value Date   RETICCTPCT 1.2 05/27/2015   RBC 3.97 (L) 01/02/2021   RETICCTABS 48.2 05/27/2015   No results  found for: KPAFRELGTCHN, LAMBDASER, KAPLAMBRATIO No results found for: IGGSERUM, IGA, IGMSERUM No results found for: Ronnald Ramp, A1GS, A2GS, Violet Baldy, MSPIKE, SPEI   Chemistry      Component Value Date/Time   NA 147 (H) 01/02/2021 0939   NA 141 12/13/2019 0955   NA 143 11/26/2016 1030  K 4.3 01/02/2021 0939   K 3.8 11/26/2016 1030   CL 109 01/02/2021 0939   CL 109 (H) 03/31/2015 1119   CO2 29 01/02/2021 0939   CO2 25 11/26/2016 1030   BUN 11 01/02/2021 0939   BUN 13 12/13/2019 0955   BUN 5.1 (L) 11/26/2016 1030   CREATININE 1.23 01/02/2021 0939   CREATININE 1.23 (H) 09/11/2020 1125   CREATININE 1.0 11/26/2016 1030      Component Value Date/Time   CALCIUM 8.9 01/02/2021 0939   CALCIUM 9.3 11/26/2016 1030   ALKPHOS 61 01/02/2021 0939   ALKPHOS 106 11/26/2016 1030   AST 17 01/02/2021 0939   AST 16 11/26/2016 1030   ALT 10 01/02/2021 0939   ALT 10 11/26/2016 1030   BILITOT 0.5 01/02/2021 0939   BILITOT 0.53 11/26/2016 1030      Impression and Plan: Mr. Longmore is a very pleasant 75 yo caucasian male with chronic phase CML.  He was only off [place] for about 4 months before he recurred.  Hopefully, we will be able to get him back into remission with [name].  I feel confident that he will do well on [name].  We will plan to get him back in another 6 months.    Volanda Napoleon, MD 1/7/202211:13 AM

## 2021-01-02 NOTE — Telephone Encounter (Signed)
Appointments scheduled calendar printed per 1/7 los °

## 2021-01-12 LAB — BCR/ABL

## 2021-01-13 NOTE — Progress Notes (Signed)
Remote pacemaker transmission.   

## 2021-01-16 ENCOUNTER — Other Ambulatory Visit: Payer: Self-pay

## 2021-01-19 ENCOUNTER — Other Ambulatory Visit: Payer: Self-pay

## 2021-01-19 ENCOUNTER — Other Ambulatory Visit (INDEPENDENT_AMBULATORY_CARE_PROVIDER_SITE_OTHER): Payer: Medicare Other

## 2021-01-19 ENCOUNTER — Telehealth: Payer: Self-pay | Admitting: Family Medicine

## 2021-01-19 DIAGNOSIS — E538 Deficiency of other specified B group vitamins: Secondary | ICD-10-CM | POA: Diagnosis not present

## 2021-01-19 NOTE — Telephone Encounter (Signed)
Yes OK to send in all 3 requested, disp 90 day supply and 1 rf

## 2021-01-19 NOTE — Telephone Encounter (Signed)
Pt states is needing refill on fluticasone (FLONASE) 50 MCG/ACT nasal spray, ondansetron (ZOFRAN) 4 MG tablet, Probiotic Product (PROBIOTIC FORMULA PO)  For 90 days sent to  [facility]  [place], [place] [address]  Phone:  [phone] Fax:  [phone]  Please advise.

## 2021-01-20 ENCOUNTER — Other Ambulatory Visit: Payer: Self-pay

## 2021-01-20 DIAGNOSIS — R110 Nausea: Secondary | ICD-10-CM

## 2021-01-20 LAB — VITAMIN B12: Vitamin B-12: 667 pg/mL (ref 200–1100)

## 2021-01-20 MED ORDER — ONDANSETRON HCL 4 MG PO TABS: 4.0000 mg | ORAL_TABLET | Freq: Two times a day (BID) | ORAL | 1 refills | Status: DC | PRN

## 2021-01-20 MED ORDER — FLUTICASONE PROPIONATE 50 MCG/ACT NA SUSP: 2.0000 | Freq: Every day | NASAL | 3 refills | Status: DC

## 2021-01-20 MED ORDER — PROBIOTIC 250 MG PO CAPS: 250.0000 mg | ORAL_CAPSULE | Freq: Every day | ORAL | 1 refills | Status: AC

## 2021-01-20 NOTE — Telephone Encounter (Signed)
Sent to pharmacy 

## 2021-03-02 ENCOUNTER — Ambulatory Visit (INDEPENDENT_AMBULATORY_CARE_PROVIDER_SITE_OTHER): Payer: Medicare Other | Admitting: Cardiology

## 2021-03-02 ENCOUNTER — Encounter: Payer: Self-pay | Admitting: Cardiology

## 2021-03-02 ENCOUNTER — Other Ambulatory Visit: Payer: Self-pay

## 2021-03-02 VITALS — BP 130/70 | HR 71 | Ht 68.0 in | Wt 187.0 lb

## 2021-03-02 DIAGNOSIS — I495 Sick sinus syndrome: Secondary | ICD-10-CM | POA: Diagnosis not present

## 2021-03-02 NOTE — Progress Notes (Signed)
Electrophysiology Office Note   Date:  03/02/2021   ID:  Jason [name], DOB Jun 28, 1946, MRN [id number]  PCP:  Jason Lukes, MD  Cardiologist:  Jason [name] Primary Electrophysiologist:  Jason Haw, MD    No chief complaint on file.    History of Present Illness: ABDULKAREEM [name] is a 75 y.o. male who is being seen today for the evaluation of atrial fibrillation at the request of [facility]. Presenting today for electrophysiology evaluation.    He has a history significant for coronary artery disease, paroxysmal atrial fibrillation, tachybradycardia syndrome status post Medtronic dual-chamber pacemaker, CLL, CVA, hypertension, hyperlipidemia, AAA.  His pacemaker was implanted 04/16/2016.  Today, denies symptoms of palpitations, chest pain, shortness of breath, orthopnea, PND, lower extremity edema, claudication, dizziness, presyncope, syncope, bleeding, or neurologic sequela. The patient is tolerating medications without difficulties. He feels mildly short of breath at times with exertion.  Overall, though he feels well and is without major complaint.  Past Medical History:  Diagnosis Date  . Anemia 05/08/2015  . CML (chronic myelocytic leukemia) ([place]) 12/16/2011  . Coronary heart disease   . CVA (cerebrovascular accident) ([place])   . GERD (gastroesophageal reflux disease)   . Grief reaction 03/18/2014  . H/O tobacco use, presenting hazards to health 02/24/2012   No cigarettes since pacemaker placement. Encouraged ongoing efforts   . History of chicken pox   . History of kidney stones   . Hyperlipidemia   . Hypertension   . IBS (irritable bowel syndrome) 09/20/2013  . Medicare annual wellness visit, subsequent 03/23/2014  . PAF (paroxysmal atrial fibrillation) ([place])   . Symptomatic bradycardia    a. symptomatic bradycardia due to sinus node dysfunction s/p RA and RV Medtronic pacemaker 04/15/16   Past Surgical History:  Procedure Laterality Date  . CATARACT  EXTRACTION    . CHOLECYSTECTOMY  2003  . CORONARY ANGIOPLASTY WITH STENT PLACEMENT  2000  . EP IMPLANTABLE DEVICE N/A 04/15/2016   Procedure: Pacemaker Implant;  Surgeon: Jason Lance, MD; Medtronic (serial number [id number]) pacemaker; Laterality: Left     Current Outpatient Medications  Medication Sig Dispense Refill  . acetaminophen (TYLENOL) 325 MG tablet Take 650 mg by mouth every 6 (six) hours as needed. For headache.    Marland Kitchen amiodarone (PACERONE) 200 MG tablet TAKE 1/2 TABLET EVERY DAY 45 tablet 2  . amLODipine (NORVASC) 5 MG tablet Take 1 tablet (5 mg total) by mouth daily. 90 tablet 2  . atorvastatin (LIPITOR) 40 MG tablet Take 1 tablet (40 mg total) by mouth daily. 90 tablet 2  . dicyclomine (BENTYL) 20 MG tablet Take 1 tablet (20 mg total) by mouth 2 (two) times daily. 20 tablet 0  . ferrous sulfate 325 (65 FE) MG EC tablet Take 1 tablet (325 mg total) by mouth 2 (two) times daily. 60 tablet 11  . fluticasone (FLONASE) 50 MCG/ACT nasal spray Place 2 sprays into both nostrils daily. 48 g 3  . imatinib ([name]) 400 MG tablet TAKE 1 TABLET (400MG  TOTAL) BY MOUTH DAILY. TAKE WITH MEALS AND LARGE GLASS OF WATER. 30 tablet 6  . levothyroxine (SYNTHROID) 25 MCG tablet Take 1 tablet (25 mcg total) by mouth daily before breakfast. 90 tablet 3  . losartan (COZAAR) 100 MG tablet Take 1 tablet (100 mg total) by mouth daily. 90 tablet 1  . meclizine (ANTIVERT) 25 MG tablet TAKE 1 TABLET THREE TIMES DAILY 90 tablet 0  . metoprolol succinate (TOPROL XL) 100 MG 24 hr tablet  Take 1 tablet (100 mg total) by mouth daily. 90 tablet 3  . nitroGLYCERIN (NITROSTAT) 0.4 MG SL tablet Place 1 tablet (0.4 mg total) under the tongue every 5 (five) minutes as needed for chest pain. 25 tablet 1  . ofloxacin (FLOXIN OTIC) 0.3 % OTIC solution Place 10 drops into both ears daily. For 7 days 10 mL 0  . omeprazole (PRILOSEC) 40 MG capsule TAKE 1 CAPSULE EVERY DAY (NEED OFFICE VISIT FOR GREATER QUANTITIES/REFILLS) 90  capsule 0  . ondansetron (ZOFRAN) 4 MG tablet Take 1 tablet (4 mg total) by mouth 2 (two) times daily as needed for nausea or vomiting. 180 tablet 1  . Probiotic Product (PROBIOTIC FORMULA PO) Take 1 capsule by mouth daily.     . Saccharomyces boulardii (PROBIOTIC) 250 MG CAPS Take 250 mg by mouth daily. 90 capsule 1  . XARELTO 20 MG TABS tablet TAKE 1 TABLET EVERY DAY WITH SUPPER 90 tablet 1   Current Facility-Administered Medications  Medication Dose Route Frequency Provider Last Rate Last Admin  . cyanocobalamin ((VITAMIN B-12)) injection 1,000 mcg  1,000 mcg Intramuscular Q30 days Jason Lukes, MD   1,000 mcg at 07/15/20 4034    Allergies:   Patient has no known allergies.   Social History:  The patient  reports that he quit smoking about 4 years ago. His smoking use included cigarettes. He started smoking about 53 years ago. He has a 10.00 pack-year smoking history. He has never used smokeless tobacco. He reports that he does not drink alcohol and does not use drugs.   Family History:  The patient's family history includes Alcohol abuse in his father; Arthritis in his mother; Cancer in his mother; Diabetes in his son; Heart disease in his mother; Hypertension in his father and mother; Other in his mother; Rheumatic fever in his mother.   ROS:  Please see the history of present illness.   Otherwise, review of systems is positive for none.   All other systems are reviewed and negative.   PHYSICAL EXAM: VS:  BP 130/70   Pulse 71   Ht 5\' 8"  (1.727 m)   Wt 187 lb (84.8 kg)   SpO2 90%   BMI 28.43 kg/m  , BMI Body mass index is 28.43 kg/m. GEN: Well nourished, well developed, in no acute distress  HEENT: normal  Neck: no JVD, carotid bruits, or masses Cardiac: RRR; no murmurs, rubs, or gallops,no edema  Respiratory:  clear to auscultation bilaterally, normal work of breathing GI: soft, nontender, nondistended, + BS MS: no deformity or atrophy  Skin: warm and dry, device site well  healed Neuro:  Strength and sensation are intact Psych: euthymic mood, full affect  EKG:  EKG is ordered today. Personal review of the ekg ordered shows atrial paced, RBBB, LAFB  Personal review of the device interrogation today. Results in [facility]: 08/13/2020: TSH 9.240 01/02/2021: ALT 10; BUN 11; Creatinine 1.23; Hemoglobin 12.2; Platelet Count 215; Potassium 4.3; Sodium 147    Lipid Panel     Component Value Date/Time   CHOL 108 09/11/2020 1125   TRIG 78 09/11/2020 1125   HDL 33 (L) 09/11/2020 1125   CHOLHDL 3.3 09/11/2020 1125   VLDL 14.2 03/06/2020 0930   LDLCALC 59 09/11/2020 1125     Wt Readings from Last 3 Encounters:  03/02/21 187 lb (84.8 kg)  01/02/21 185 lb (83.9 kg)  09/11/20 193 lb (87.5 kg)      Other studies Reviewed: Additional  studies/ records that were reviewed today include: TTE 04/16/16  Review of the above records today demonstrates:  - Left ventricle: The cavity size was normal. Wall thickness was   normal. Systolic function was mildly to moderately reduced. The   estimated ejection fraction was in the range of 40% to 45%.   Diffuse hypokinesis. Doppler parameters are consistent with   abnormal left ventricular relaxation (grade 1 diastolic   dysfunction). - Aortic valve: Trileaflet; mildly calcified leaflets. There was   mild to moderate regurgitation. - Aorta: Mildly dilated aortic root. Dilated ascending aorta.   Ascending aorta: 44 mm. Aortic root dimension: 37 mm (ED). - Mitral valve: There was no significant regurgitation. - Right ventricle: The cavity size was normal. Pacer wire or   catheter noted in right ventricle. Systolic function was normal. - Pulmonary arteries: No complete TR doppler jet so unable to   estimate PA systolic pressure. - Inferior vena cava: The vessel was normal in size. The   respirophasic diameter changes were in the normal range (>= 50%),   consistent with normal central venous  pressure.   ASSESSMENT AND PLAN:  1.  Tachybradycardia syndrome: Status post Medtronic dual-chamber pacemaker implanted in 2017.  Device functioning appropriately.  No changes.    2.  Paroxysmal atrial fibrillation: Currently on Xarelto and amiodarone.  High risk medication monitoring.  CHA2DS2-VASc of 5.  Remains in sinus rhythm with minimal atrial fibrillation.  No changes.  Has a low burden based on device interrogation.  3.  Coronary artery disease: Currently on aspirin, beta-blocker, statin.  No current chest pain.  No changes.      4.  Hypertension: Currently well controlled  The patient does not have concerns regarding his medicines.  The following changes were made today: None  Labs/ tests ordered today include:  Orders Placed This Encounter  Procedures  . EKG 12-Lead     Disposition:   FU with Gaelen Brager 6 months  Signed, Huey Scalia Meredith Leeds, MD  03/02/2021 2:28 PM     [place] [address] [phone] (office) [phone] (fax)

## 2021-03-02 NOTE — Patient Instructions (Signed)
Medication Instructions:  Your physician recommends that you continue on your current medications as directed. Please refer to the Current Medication list given to you today.  *If you need a refill on your cardiac medications before your next appointment, please call your pharmacy*   Lab Work: None ordered If you have labs (blood work) drawn today and your tests are completely normal, you will receive your results only by: Marland Kitchen MyChart Message (if you have MyChart) OR . A paper copy in the mail If you have any lab test that is abnormal or we need to change your treatment, we will call you to review the results.   Testing/Procedures: None ordered   Follow-Up: At [facility], you and your health needs are our priority.  As part of our continuing mission to provide you with exceptional heart care, we have created designated Provider Care Teams.  These Care Teams include your primary Cardiologist (physician) and Advanced Practice Providers (APPs -  Physician Assistants and Nurse Practitioners) who all work together to provide you with the care you need, when you need it.  We recommend signing up for the patient portal called "MyChart".  Sign up information is provided on this After Visit Summary.  MyChart is used to connect with patients for Virtual Visits (Telemedicine).  Patients are able to view lab/test results, encounter notes, upcoming appointments, etc.  Non-urgent messages can be sent to your provider as well.   To learn more about what you can do with MyChart, go to [email or web address].    Remote monitoring is used to monitor your Pacemaker or ICD from home. This monitoring reduces the number of office visits required to check your device to one time per year. It allows [place] to keep an eye on the functioning of your device to ensure it is working properly. You are scheduled for a device check from home on 03/30/2021. You may send your transmission at any time that day. If you have a  wireless device, the transmission will be sent automatically. After your physician reviews your transmission, you will receive a postcard with your next transmission date.  Your next appointment:   6 month(s)  The format for your next appointment:   In Person  Provider:   Allegra Lai, MD   Thank you for choosing [facility]!!   Trinidad Curet, RN [phone]    Other Instructions

## 2021-03-05 ENCOUNTER — Other Ambulatory Visit: Payer: Self-pay

## 2021-03-05 ENCOUNTER — Ambulatory Visit: Payer: Medicare HMO | Admitting: *Deleted

## 2021-03-05 ENCOUNTER — Ambulatory Visit (INDEPENDENT_AMBULATORY_CARE_PROVIDER_SITE_OTHER): Payer: Medicare Other

## 2021-03-05 VITALS — BP 160/80 | HR 63 | Temp 97.9°F | Resp 16 | Ht 68.0 in | Wt 189.2 lb

## 2021-03-05 DIAGNOSIS — Z0000 Encounter for general adult medical examination without abnormal findings: Secondary | ICD-10-CM | POA: Diagnosis not present

## 2021-03-05 NOTE — Progress Notes (Signed)
Subjective:   Jason [name] is a 75 y.o. male who presents for Medicare Annual/Subsequent preventive examination.  Review of Systems     Cardiac Risk Factors include: advanced age (>35men, >29 women);male gender;hypertension;dyslipidemia     Objective:    Today's Vitals   03/05/21 1016 03/05/21 1101  BP: (!) 160/80 (!) 160/80  Pulse: 63   Resp: 16   Temp: 97.9 F (36.6 C)   TempSrc: Oral   SpO2: 95%   Weight: 189 lb 3.2 oz (85.8 kg)   Height: 5\' 8"  (1.727 m)    Body mass index is 28.77 kg/m.  Advanced Directives 03/05/2021 01/02/2021 09/03/2020 05/01/2020 03/04/2020 01/02/2020 10/22/2019  Does Patient Have a Medical Advance Directive? No No No No No No No  Would patient like information on creating a medical advance directive? No - Patient declined No - Patient declined No - Patient declined No - Patient declined No - Patient declined No - Patient declined No - Patient declined    Current Medications (verified) Outpatient Encounter Medications as of 03/05/2021  Medication Sig  . acetaminophen (TYLENOL) 325 MG tablet Take 650 mg by mouth every 6 (six) hours as needed. For headache.  Marland Kitchen amiodarone (PACERONE) 200 MG tablet TAKE 1/2 TABLET EVERY DAY  . amLODipine (NORVASC) 5 MG tablet Take 1 tablet (5 mg total) by mouth daily.  Marland Kitchen atorvastatin (LIPITOR) 40 MG tablet Take 1 tablet (40 mg total) by mouth daily.  Marland Kitchen dicyclomine (BENTYL) 20 MG tablet Take 1 tablet (20 mg total) by mouth 2 (two) times daily.  . ferrous sulfate 325 (65 FE) MG EC tablet Take 1 tablet (325 mg total) by mouth 2 (two) times daily.  . fluticasone (FLONASE) 50 MCG/ACT nasal spray Place 2 sprays into both nostrils daily.  Marland Kitchen imatinib ([name]) 400 MG tablet TAKE 1 TABLET (400MG  TOTAL) BY MOUTH DAILY. TAKE WITH MEALS AND LARGE GLASS OF WATER.  Marland Kitchen levothyroxine (SYNTHROID) 25 MCG tablet Take 1 tablet (25 mcg total) by mouth daily before breakfast.  . losartan (COZAAR) 100 MG tablet Take 1 tablet (100 mg total) by mouth  daily.  . meclizine (ANTIVERT) 25 MG tablet TAKE 1 TABLET THREE TIMES DAILY  . metoprolol succinate (TOPROL XL) 100 MG 24 hr tablet Take 1 tablet (100 mg total) by mouth daily.  . nitroGLYCERIN (NITROSTAT) 0.4 MG SL tablet Place 1 tablet (0.4 mg total) under the tongue every 5 (five) minutes as needed for chest pain.  Marland Kitchen ofloxacin (FLOXIN OTIC) 0.3 % OTIC solution Place 10 drops into both ears daily. For 7 days  . omeprazole (PRILOSEC) 40 MG capsule TAKE 1 CAPSULE EVERY DAY (NEED OFFICE VISIT FOR GREATER QUANTITIES/REFILLS)  . ondansetron (ZOFRAN) 4 MG tablet Take 1 tablet (4 mg total) by mouth 2 (two) times daily as needed for nausea or vomiting.  . Probiotic Product (PROBIOTIC FORMULA PO) Take 1 capsule by mouth daily.   . Saccharomyces boulardii (PROBIOTIC) 250 MG CAPS Take 250 mg by mouth daily.  [name] 20 MG TABS tablet TAKE 1 TABLET EVERY DAY WITH SUPPER   Facility-Administered Encounter Medications as of 03/05/2021  Medication  . cyanocobalamin ((VITAMIN B-12)) injection 1,000 mcg    Allergies (verified) Patient has no known allergies.   History: Past Medical History:  Diagnosis Date  . Anemia 05/08/2015  . CML (chronic myelocytic leukemia) ([place]) 12/16/2011  . Coronary heart disease   . CVA (cerebrovascular accident) ([place])   . GERD (gastroesophageal reflux disease)   . Grief reaction 03/18/2014  .  H/O tobacco use, presenting hazards to health 02/24/2012   No cigarettes since pacemaker placement. Encouraged ongoing efforts   . History of chicken pox   . History of kidney stones   . Hyperlipidemia   . Hypertension   . IBS (irritable bowel syndrome) 09/20/2013  . Medicare annual wellness visit, subsequent 03/23/2014  . PAF (paroxysmal atrial fibrillation) ([place])   . Symptomatic bradycardia    a. symptomatic bradycardia due to sinus node dysfunction s/p RA and RV Medtronic pacemaker 04/15/16   Past Surgical History:  Procedure Laterality Date  . CATARACT EXTRACTION    .  CHOLECYSTECTOMY  2003  . CORONARY ANGIOPLASTY WITH STENT PLACEMENT  2000  . EP IMPLANTABLE DEVICE N/A 04/15/2016   Procedure: Pacemaker Implant;  Surgeon: Evans Lance, MD; Medtronic (serial number [id number]) pacemaker; Laterality: Left   Family History  Problem Relation Age of Onset  . Arthritis Mother   . Hypertension Mother   . Heart disease Mother        Rheumatic fever  . Rheumatic fever Mother   . Other Mother        brain tumor  . Cancer Mother        leukemia  . Hypertension Father   . Alcohol abuse Father   . Diabetes Son   . Colon cancer Neg Hx   . Breast cancer Neg Hx   . Prostate cancer Neg Hx    Social History   Socioeconomic History  . Marital status: Widowed    Spouse name: Not on file  . Number of children: 3  . Years of education: Not on file  . Highest education level: Not on file  Occupational History  . Occupation: Retired    Comment: Retired  Immunologist  . Smoking status: Former Smoker    Packs/day: 0.25    Years: 40.00    Pack years: 10.00    Types: Cigarettes    Start date: 10/30/1967    Quit date: 04/14/2016    Years since quitting: 4.8  . Smokeless tobacco: Never Used  Vaping Use  . Vaping Use: Never used  Substance and Sexual Activity  . Alcohol use: No    Alcohol/week: 0.0 standard drinks    Comment: Rare  . Drug use: No  . Sexual activity: Never    Comment: wife died in 2023/02/01 married 51 years, grandson livew with patient  Other Topics Concern  . Not on file  Social History Narrative  . Not on file   Social Determinants of Health   Financial Resource Strain: Low Risk   . Difficulty of Paying Living Expenses: Not hard at all  Food Insecurity: No Food Insecurity  . Worried About Charity fundraiser in the Last Year: Never true  . Ran Out of Food in the Last Year: Never true  Transportation Needs: No Transportation Needs  . Lack of Transportation (Medical): No  . Lack of Transportation (Non-Medical): No  Physical Activity:  Inactive  . Days of Exercise per Week: 0 days  . Minutes of Exercise per Session: 0 min  Stress: No Stress Concern Present  . Feeling of Stress : Not at all  Social Connections: Moderately Isolated  . Frequency of Communication with Friends and Family: More than three times a week  . Frequency of Social Gatherings with Friends and Family: Once a week  . Attends Religious Services: 1 to 4 times per year  . Active Member of Clubs or Organizations: No  . Attends Club or  Organization Meetings: Never  . Marital Status: Widowed    Tobacco Counseling Counseling given: Not Answered   Clinical Intake:  Pre-visit preparation completed: Yes  Pain : No/denies pain     Nutritional Status: BMI 25 -29 Overweight Nutritional Risks: None Diabetes: No  How often do you need to have someone help you when you read instructions, pamphlets, or other written materials from your doctor or pharmacy?: 1 - Never  Diabetic?No  Interpreter Needed?: No  Information entered by :: Caroleen Hamman LPN   Activities of Daily Living In your present state of health, do you have any difficulty performing the following activities: 03/05/2021  Hearing? N  Vision? N  Difficulty concentrating or making decisions? N  Walking or climbing stairs? N  Dressing or bathing? N  Doing errands, shopping? N  Preparing Food and eating ? N  Using the Toilet? N  In the past six months, have you accidently leaked urine? N  Do you have problems with loss of bowel control? N  Managing your Medications? N  Managing your Finances? N  Housekeeping or managing your Housekeeping? N  Some recent data might be hidden    Patient Care Team: Mosie Lukes, MD as PCP - General (Family Medicine) Constance Haw, MD as PCP - Electrophysiology (Cardiology) Constance Haw, MD as Consulting Physician (Cardiology)  Indicate any recent Medical Services you may have received from other than [facility] providers in the past  year (date may be approximate).     Assessment:   This is a routine wellness examination for Jason [name].  Hearing/Vision screen  Hearing Screening   125Hz  250Hz  500Hz  1000Hz  2000Hz  3000Hz  4000Hz  6000Hz  8000Hz   Right ear:           Left ear:           Comments: No issues  Vision Screening Comments: Reading glasses Last eye exam-3-4 years   Dietary issues and exercise activities discussed: Current Exercise Habits: The patient does not participate in regular exercise at present, Exercise limited by: None identified  Goals    . DIET - INCREASE WATER INTAKE    . Patient Stated     Increase activity      Depression Screen PHQ 2/9 Scores 03/05/2021 03/04/2020 10/04/2019 09/22/2018 09/15/2017 08/06/2016 05/27/2015  PHQ - 2 Score 1 0 0 0 0 0 0    Fall Risk Fall Risk  03/05/2021 03/04/2020 10/04/2019 09/22/2018 09/15/2017  Falls in the past year? 1 0 0 No No  Number falls in past yr: 0 0 0 - -  Injury with Fall? 0 0 0 - -  Follow up Falls prevention discussed Education provided;Falls prevention discussed - - -    FALL RISK PREVENTION PERTAINING TO THE HOME:  Any stairs in or around the home? Yes  If so, are there any without handrails? No  Home free of loose throw rugs in walkways, pet beds, electrical cords, etc? Yes  Adequate lighting in your home to reduce risk of falls? Yes   ASSISTIVE DEVICES UTILIZED TO PREVENT FALLS:  Life alert? No  Use of a cane, walker or w/c? No  Grab bars in the bathroom? Yes  Shower chair or bench in shower? No  Elevated toilet seat or a handicapped toilet? No   TIMED UP AND GO:  Was the test performed? Yes .  Length of time to ambulate 10 feet: 10 sec.   Gait steady and fast without use of assistive device  Cognitive Function:Normal cognitive status assessed  by direct observation by this Nurse Health Advisor. No abnormalities found.   MMSE - Mini Mental State Exam 10/04/2019  Not completed: Refused        Immunizations Immunization History   Administered Date(s) Administered  . Fluad Quad(high Dose 65+) 09/11/2020  . Pneumococcal Conjugate-13 11/07/2015  . Pneumococcal Polysaccharide-23 09/15/2017  . Tdap 11/07/2015    TDAP status: Up to date  Flu Vaccine status: Up to date  Pneumococcal vaccine status: Up to date  Covid-19 vaccine status: Declined, Education has been provided regarding the importance of this vaccine but patient still declined. Advised may receive this vaccine at local pharmacy or Health Dept.or vaccine clinic. Aware to provide a copy of the vaccination record if obtained from local pharmacy or Health Dept. Verbalized acceptance and understanding.  Qualifies for Shingles Vaccine? Yes   Zostavax completed No   Shingrix Completed?: No.    Education has been provided regarding the importance of this vaccine. Patient has been advised to call insurance company to determine out of pocket expense if they have not yet received this vaccine. Advised may also receive vaccine at local pharmacy or Health Dept. Verbalized acceptance and understanding.  Screening Tests Health Maintenance  Topic Date Due  . COVID-19 Vaccine (1) Never done  . COLONOSCOPY (Pts 45-42yrs Insurance coverage will need to be confirmed)  09/01/2018  . TETANUS/TDAP  11/06/2025  . INFLUENZA VACCINE  Completed  . Hepatitis C Screening  Completed  . PNA vac Low Risk Adult  Completed  . HPV VACCINES  Aged Out    Health Maintenance  Health Maintenance Due  Topic Date Due  . COVID-19 Vaccine (1) Never done  . COLONOSCOPY (Pts 45-62yrs Insurance coverage will need to be confirmed)  09/01/2018    Colorectal cancer screening: Due-Declined today, Patient states he will let us know when he is ready to schedule.  Lung Cancer Screening: (Low Dose CT Chest recommended if Age 19-80 years, 30 pack-year currently smoking OR have quit w/in 15years.) does not qualify.    Additional Screening:  Hepatitis C Screening:Completed 11/07/2015  Vision  Screening: Recommended annual ophthalmology exams for early detection of glaucoma and other disorders of the eye. Is the patient up to date with their annual eye exam?  No  Who is the provider or what is the name of the office in which the patient attends annual eye exams? unsureof name If pt is not established with a provider, would they like to be referred to a provider to establish care? No .   Dental Screening: Recommended annual dental exams for proper oral hygiene  Community Resource Referral / Chronic Care Management: CRR required this visit?  No   CCM required this visit?  No      Plan:     I have personally reviewed and noted the following in the patient's chart:   . Medical and social history . Use of alcohol, tobacco or illicit drugs  . Current medications and supplements . Functional ability and status . Nutritional status . Physical activity . Advanced directives . List of other physicians . Hospitalizations, surgeries, and ER visits in previous 12 months . Vitals . Screenings to include cognitive, depression, and falls . Referrals and appointments  In addition, I have reviewed and discussed with patient certain preventive protocols, quality metrics, and best practice recommendations. A written personalized care plan for preventive services as well as general preventive health recommendations were provided to patient.     Marta Antu, LPN  03/05/2021  Nurse Health Advisor  Nurse Notes: B/p 160/80 today. Patient states he has not taken his medication yet today. He plans to check his B/P at home & will call us back if it remains elevated.

## 2021-03-05 NOTE — Patient Instructions (Signed)
Jason [name] , Thank you for taking time to come for your Medicare Wellness Visit. I appreciate your ongoing commitment to your health goals. Please review the following plan we discussed and let me know if I can assist you in the future.   Screening recommendations/referrals: Colonoscopy: Due-Declined today. Please call the office when you are ready to schedule. Recommended yearly ophthalmology/optometry visit for glaucoma screening and checkup Recommended yearly dental visit for hygiene and checkup  Vaccinations: Influenza vaccine: Up to date Pneumococcal vaccine: Completed vaccines Tdap vaccine: Up to date-Due-11/06/2025 Shingles vaccine: Discuss with pharmacy Covid-19: Declined  Advanced directives: Declined information today.  Conditions/risks identified: See problem list  Next appointment: Follow up in one year for your annual wellness visit.   Preventive Care 73 Years and Older, Male Preventive care refers to lifestyle choices and visits with your health care provider that can promote health and wellness. What does preventive care include?  A yearly physical exam. This is also called an annual well check.  Dental exams once or twice a year.  Routine eye exams. Ask your health care provider how often you should have your eyes checked.  Personal lifestyle choices, including:  Daily care of your teeth and gums.  Regular physical activity.  Eating a healthy diet.  Avoiding tobacco and drug use.  Limiting alcohol use.  Practicing safe sex.  Taking low doses of aspirin every day.  Taking vitamin and mineral supplements as recommended by your health care provider. What happens during an annual well check? The services and screenings done by your health care provider during your annual well check will depend on your age, overall health, lifestyle risk factors, and family history of disease. Counseling  Your health care provider may ask you questions about  your:  Alcohol use.  Tobacco use.  Drug use.  Emotional well-being.  Home and relationship well-being.  Sexual activity.  Eating habits.  History of falls.  Memory and ability to understand (cognition).  Work and work Statistician. Screening  You may have the following tests or measurements:  Height, weight, and BMI.  Blood pressure.  Lipid and cholesterol levels. These may be checked every 5 years, or more frequently if you are over 56 years old.  Skin check.  Lung cancer screening. You may have this screening every year starting at age 38 if you have a 30-pack-year history of smoking and currently smoke or have quit within the past 15 years.  Fecal occult blood test (FOBT) of the stool. You may have this test every year starting at age 28.  Flexible sigmoidoscopy or colonoscopy. You may have a sigmoidoscopy every 5 years or a colonoscopy every 10 years starting at age 62.  Prostate cancer screening. Recommendations will vary depending on your family history and other risks.  Hepatitis C blood test.  Hepatitis B blood test.  Sexually transmitted disease (STD) testing.  Diabetes screening. This is done by checking your blood sugar (glucose) after you have not eaten for a while (fasting). You may have this done every 1-3 years.  Abdominal aortic aneurysm (AAA) screening. You may need this if you are a current or former smoker.  Osteoporosis. You may be screened starting at age 45 if you are at high risk. Talk with your health care provider about your test results, treatment options, and if necessary, the need for more tests. Vaccines  Your health care provider may recommend certain vaccines, such as:  Influenza vaccine. This is recommended every year.  Tetanus, diphtheria, and  acellular pertussis (Tdap, Td) vaccine. You may need a Td booster every 10 years.  Zoster vaccine. You may need this after age 25.  Pneumococcal 13-valent conjugate (PCV13) vaccine.  One dose is recommended after age 28.  Pneumococcal polysaccharide (PPSV23) vaccine. One dose is recommended after age 54. Talk to your health care provider about which screenings and vaccines you need and how often you need them. This information is not intended to replace advice given to you by your health care provider. Make sure you discuss any questions you have with your health care provider. Document Released: 01/09/2016 Document Revised: 09/01/2016 Document Reviewed: 10/14/2015 Elsevier Interactive Patient Education  2017 [place] Prevention in the Home Falls can cause injuries. They can happen to people of all ages. There are many things you can do to make your home safe and to help prevent falls. What can I do on the outside of my home?  Regularly fix the edges of walkways and driveways and fix any cracks.  Remove anything that might make you trip as you walk through a door, such as a raised step or threshold.  Trim any bushes or trees on the path to your home.  Use bright outdoor lighting.  Clear any walking paths of anything that might make someone trip, such as rocks or tools.  Regularly check to see if handrails are loose or broken. Make sure that both sides of any steps have handrails.  Any raised decks and porches should have guardrails on the edges.  Have any leaves, snow, or ice cleared regularly.  Use sand or salt on walking paths during winter.  Clean up any spills in your garage right away. This includes oil or grease spills. What can I do in the bathroom?  Use night lights.  Install grab bars by the toilet and in the tub and shower. Do not use towel bars as grab bars.  Use non-skid mats or decals in the tub or shower.  If you need to sit down in the shower, use a plastic, non-slip stool.  Keep the floor dry. Clean up any water that spills on the floor as soon as it happens.  Remove soap buildup in the tub or shower regularly.  Attach bath  mats securely with double-sided non-slip rug tape.  Do not have throw rugs and other things on the floor that can make you trip. What can I do in the bedroom?  Use night lights.  Make sure that you have a light by your bed that is easy to reach.  Do not use any sheets or blankets that are too big for your bed. They should not hang down onto the floor.  Have a firm chair that has side arms. You can use this for support while you get dressed.  Do not have throw rugs and other things on the floor that can make you trip. What can I do in the kitchen?  Clean up any spills right away.  Avoid walking on wet floors.  Keep items that you use a lot in easy-to-reach places.  If you need to reach something above you, use a strong step stool that has a grab bar.  Keep electrical cords out of the way.  Do not use floor polish or wax that makes floors slippery. If you must use wax, use non-skid floor wax.  Do not have throw rugs and other things on the floor that can make you trip. What can I do with my stairs?  Do not leave any items on the stairs.  Make sure that there are handrails on both sides of the stairs and use them. Fix handrails that are broken or loose. Make sure that handrails are as long as the stairways.  Check any carpeting to make sure that it is firmly attached to the stairs. Fix any carpet that is loose or worn.  Avoid having throw rugs at the top or bottom of the stairs. If you do have throw rugs, attach them to the floor with carpet tape.  Make sure that you have a light switch at the top of the stairs and the bottom of the stairs. If you do not have them, ask someone to add them for you. What else can I do to help prevent falls?  Wear shoes that:  Do not have high heels.  Have rubber bottoms.  Are comfortable and fit you well.  Are closed at the toe. Do not wear sandals.  If you use a stepladder:  Make sure that it is fully opened. Do not climb a closed  stepladder.  Make sure that both sides of the stepladder are locked into place.  Ask someone to hold it for you, if possible.  Clearly mark and make sure that you can see:  Any grab bars or handrails.  First and last steps.  Where the edge of each step is.  Use tools that help you move around (mobility aids) if they are needed. These include:  Canes.  Walkers.  Scooters.  Crutches.  Turn on the lights when you go into a dark area. Replace any light bulbs as soon as they burn out.  Set up your furniture so you have a clear path. Avoid moving your furniture around.  If any of your floors are uneven, fix them.  If there are any pets around you, be aware of where they are.  Review your medicines with your doctor. Some medicines can make you feel dizzy. This can increase your chance of falling. Ask your doctor what other things that you can do to help prevent falls. This information is not intended to replace advice given to you by your health care provider. Make sure you discuss any questions you have with your health care provider. Document Released: 10/09/2009 Document Revised: 05/20/2016 Document Reviewed: 01/17/2015 Elsevier Interactive Patient Education  2017 [employer].

## 2021-03-11 ENCOUNTER — Other Ambulatory Visit: Payer: Self-pay | Admitting: Hematology & Oncology

## 2021-03-11 DIAGNOSIS — C9210 Chronic myeloid leukemia, BCR/ABL-positive, not having achieved remission: Secondary | ICD-10-CM

## 2021-03-24 ENCOUNTER — Other Ambulatory Visit: Payer: Self-pay

## 2021-03-24 ENCOUNTER — Ambulatory Visit (INDEPENDENT_AMBULATORY_CARE_PROVIDER_SITE_OTHER): Payer: Medicare Other | Admitting: Family Medicine

## 2021-03-24 ENCOUNTER — Encounter: Payer: Self-pay | Admitting: Family Medicine

## 2021-03-24 ENCOUNTER — Other Ambulatory Visit: Payer: Self-pay | Admitting: Family Medicine

## 2021-03-24 VITALS — BP 130/76 | HR 92 | Temp 97.7°F | Resp 16 | Wt 185.6 lb

## 2021-03-24 DIAGNOSIS — E782 Mixed hyperlipidemia: Secondary | ICD-10-CM | POA: Diagnosis not present

## 2021-03-24 DIAGNOSIS — I10 Essential (primary) hypertension: Secondary | ICD-10-CM

## 2021-03-24 DIAGNOSIS — W19XXXA Unspecified fall, initial encounter: Secondary | ICD-10-CM | POA: Insufficient documentation

## 2021-03-24 DIAGNOSIS — C9210 Chronic myeloid leukemia, BCR/ABL-positive, not having achieved remission: Secondary | ICD-10-CM | POA: Diagnosis not present

## 2021-03-24 DIAGNOSIS — I2510 Atherosclerotic heart disease of native coronary artery without angina pectoris: Secondary | ICD-10-CM

## 2021-03-24 DIAGNOSIS — E538 Deficiency of other specified B group vitamins: Secondary | ICD-10-CM | POA: Diagnosis not present

## 2021-03-24 DIAGNOSIS — N289 Disorder of kidney and ureter, unspecified: Secondary | ICD-10-CM

## 2021-03-24 DIAGNOSIS — I480 Paroxysmal atrial fibrillation: Secondary | ICD-10-CM

## 2021-03-24 DIAGNOSIS — M25561 Pain in right knee: Secondary | ICD-10-CM | POA: Insufficient documentation

## 2021-03-24 DIAGNOSIS — G8929 Other chronic pain: Secondary | ICD-10-CM

## 2021-03-24 DIAGNOSIS — K635 Polyp of colon: Secondary | ICD-10-CM | POA: Diagnosis not present

## 2021-03-24 DIAGNOSIS — W19XXXS Unspecified fall, sequela: Secondary | ICD-10-CM

## 2021-03-24 DIAGNOSIS — E039 Hypothyroidism, unspecified: Secondary | ICD-10-CM

## 2021-03-24 LAB — LIPID PANEL
Cholesterol: 130 mg/dL (ref 0–200)
HDL: 37.2 mg/dL — ABNORMAL LOW (ref >39.00)
LDL Cholesterol: 74 mg/dL (ref 0–99)
NonHDL: 92.67
Total CHOL/HDL Ratio: 3
Triglycerides: 94 mg/dL (ref 0.0–149.0)
VLDL: 18.8 mg/dL (ref 0.0–40.0)

## 2021-03-24 LAB — CBC
HCT: 40.9 % (ref 39.0–52.0)
Hemoglobin: 13.5 g/dL (ref 13.0–17.0)
MCHC: 33.1 g/dL (ref 30.0–36.0)
MCV: 94.8 fl (ref 78.0–100.0)
Platelets: 216 10*3/uL (ref 150.0–400.0)
RBC: 4.32 Mil/uL (ref 4.22–5.81)
RDW: 14.9 % (ref 11.5–15.5)
WBC: 9.4 10*3/uL (ref 4.0–10.5)

## 2021-03-24 LAB — COMPREHENSIVE METABOLIC PANEL
ALT: 10 U/L (ref 0–53)
AST: 14 U/L (ref 0–37)
Albumin: 4.2 g/dL (ref 3.5–5.2)
Alkaline Phosphatase: 79 U/L (ref 39–117)
BUN: 9 mg/dL (ref 6–23)
CO2: 27 mEq/L (ref 19–32)
Calcium: 9.5 mg/dL (ref 8.4–10.5)
Chloride: 109 mEq/L (ref 96–112)
Creatinine, Ser: 1.17 mg/dL (ref 0.40–1.50)
GFR: 62.06 mL/min (ref >60.00)
Glucose, Bld: 90 mg/dL (ref 70–99)
Potassium: 4.4 mEq/L (ref 3.5–5.1)
Sodium: 144 mEq/L (ref 135–145)
Total Bilirubin: 0.5 mg/dL (ref 0.2–1.2)
Total Protein: 7.3 g/dL (ref 6.0–8.3)

## 2021-03-24 LAB — VITAMIN B12: Vitamin B-12: 548 pg/mL (ref 211–911)

## 2021-03-24 LAB — TSH: TSH: 6.92 u[IU]/mL — ABNORMAL HIGH (ref 0.35–4.50)

## 2021-03-24 MED ORDER — LEVOTHYROXINE SODIUM 50 MCG PO TABS: 50.0000 ug | ORAL_TABLET | Freq: Every day | ORAL | 3 refills | Status: DC

## 2021-03-24 NOTE — Assessment & Plan Note (Signed)
Tolerating statin, encouraged heart healthy diet, avoid trans fats, minimize simple carbs and saturated fats. Increase exercise as tolerated 

## 2021-03-24 NOTE — Assessment & Plan Note (Signed)
Supplement and monitor 

## 2021-03-24 NOTE — Assessment & Plan Note (Signed)
On Levothyroxine, continue to monitor 

## 2021-03-24 NOTE — Assessment & Plan Note (Signed)
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 He was at the bottom stair so no serious injury occurred but he was sore on left side where he had hit first, some bruising on left thigh. All symptoms have resolved. He will let us know if this recures so we can consider PT and/or referral

## 2021-03-24 NOTE — Assessment & Plan Note (Signed)
Comes and goes, no injury and no pain today. He notes some weakness when he tries to carry things up stairs he is not interested in a referral at this time. He will let us know if he wants a referral

## 2021-03-24 NOTE — Patient Instructions (Addendum)
Shingrix is the new shingles shot 2 shots over 2-6 months at the pharmacy Hypertension, Adult High blood pressure (hypertension) is when the force of blood pumping through the arteries is too strong. The arteries are the blood vessels that carry blood from the heart throughout the body. Hypertension forces the heart to work harder to pump blood and may cause arteries to become narrow or stiff. Untreated or uncontrolled hypertension can cause a heart attack, heart failure, a stroke, kidney disease, and other problems. A blood pressure reading consists of a higher number over a lower number. Ideally, your blood pressure should be below 120/80. The first ("top") number is called the systolic pressure. It is a measure of the pressure in your arteries as your heart beats. The second ("bottom") number is called the diastolic pressure. It is a measure of the pressure in your arteries as the heart relaxes. What are the causes? The exact cause of this condition is not known. There are some conditions that result in or are related to high blood pressure. What increases the risk? Some risk factors for high blood pressure are under your control. The following factors may make you more likely to develop this condition:  Smoking.  Having type 2 diabetes mellitus, high cholesterol, or both.  Not getting enough exercise or physical activity.  Being overweight.  Having too much fat, sugar, calories, or salt (sodium) in your diet.  Drinking too much alcohol. Some risk factors for high blood pressure may be difficult or impossible to change. Some of these factors include:  Having chronic kidney disease.  Having a family history of high blood pressure.  Age. Risk increases with age.  Race. You may be at higher risk if you are African American.  Gender. Men are at higher risk than women before age 39. After age 29, women are at higher risk than men.  Having obstructive sleep apnea.  Stress. What are  the signs or symptoms? High blood pressure may not cause symptoms. Very high blood pressure (hypertensive crisis) may cause:  Headache.  Anxiety.  Shortness of breath.  Nosebleed.  Nausea and vomiting.  Vision changes.  Severe chest pain.  Seizures. How is this diagnosed? This condition is diagnosed by measuring your blood pressure while you are seated, with your arm resting on a flat surface, your legs uncrossed, and your feet flat on the floor. The cuff of the blood pressure monitor will be placed directly against the skin of your upper arm at the level of your heart. It should be measured at least twice using the same arm. Certain conditions can cause a difference in blood pressure between your right and left arms. Certain factors can cause blood pressure readings to be lower or higher than normal for a short period of time:  When your blood pressure is higher when you are in a health care provider's office than when you are at home, this is called white coat hypertension. Most people with this condition do not need medicines.  When your blood pressure is higher at home than when you are in a health care provider's office, this is called masked hypertension. Most people with this condition may need medicines to control blood pressure. If you have a high blood pressure reading during one visit or you have normal blood pressure with other risk factors, you may be asked to:  Return on a different day to have your blood pressure checked again.  Monitor your blood pressure at home for 1 week  or longer. If you are diagnosed with hypertension, you may have other blood or imaging tests to help your health care provider understand your overall risk for other conditions. How is this treated? This condition is treated by making healthy lifestyle changes, such as eating healthy foods, exercising more, and reducing your alcohol intake. Your health care provider may prescribe medicine if  lifestyle changes are not enough to get your blood pressure under control, and if:  Your systolic blood pressure is above 130.  Your diastolic blood pressure is above 80. Your personal target blood pressure may vary depending on your medical conditions, your age, and other factors. Follow these instructions at home: Eating and drinking  Eat a diet that is high in fiber and potassium, and low in sodium, added sugar, and fat. An example eating plan is called the DASH (Dietary Approaches to Stop Hypertension) diet. To eat this way: ? Eat plenty of fresh fruits and vegetables. Try to fill one half of your plate at each meal with fruits and vegetables. ? Eat whole grains, such as whole-wheat pasta, brown rice, or whole-grain bread. Fill about one fourth of your plate with whole grains. ? Eat or drink low-fat dairy products, such as skim milk or low-fat yogurt. ? Avoid fatty cuts of meat, processed or cured meats, and poultry with skin. Fill about one fourth of your plate with lean proteins, such as fish, chicken without skin, beans, eggs, or tofu. ? Avoid pre-made and processed foods. These tend to be higher in sodium, added sugar, and fat.  Reduce your daily sodium intake. Most people with hypertension should eat less than 1,500 mg of sodium a day.  Do not drink alcohol if: ? Your health care provider tells you not to drink. ? You are pregnant, may be pregnant, or are planning to become pregnant.  If you drink alcohol: ? Limit how much you use to:  0-1 drink a day for women.  0-2 drinks a day for men. ? Be aware of how much alcohol is in your drink. In the [place] one drink equals one 12 oz bottle of beer (355 mL), one 5 oz glass of wine (148 mL), or one 1 oz glass of hard liquor (44 mL).   Lifestyle  Work with your health care provider to maintain a healthy body weight or to lose weight. Ask what an ideal weight is for you.  Get at least 30 minutes of exercise most days of the week.  Activities may include walking, swimming, or biking.  Include exercise to strengthen your muscles (resistance exercise), such as Pilates or lifting weights, as part of your weekly exercise routine. Try to do these types of exercises for 30 minutes at least 3 days a week.  Do not use any products that contain nicotine or tobacco, such as cigarettes, e-cigarettes, and chewing tobacco. If you need help quitting, ask your health care provider.  Monitor your blood pressure at home as told by your health care provider.  Keep all follow-up visits as told by your health care provider. This is important.   Medicines  Take over-the-counter and prescription medicines only as told by your health care provider. Follow directions carefully. Blood pressure medicines must be taken as prescribed.  Do not skip doses of blood pressure medicine. Doing this puts you at risk for problems and can make the medicine less effective.  Ask your health care provider about side effects or reactions to medicines that you should watch for. Contact a  health care provider if you:  Think you are having a reaction to a medicine you are taking.  Have headaches that keep coming back (recurring).  Feel dizzy.  Have swelling in your ankles.  Have trouble with your vision. Get help right away if you:  Develop a severe headache or confusion.  Have unusual weakness or numbness.  Feel faint.  Have severe pain in your chest or abdomen.  Vomit repeatedly.  Have trouble breathing. Summary  Hypertension is when the force of blood pumping through your arteries is too strong. If this condition is not controlled, it may put you at risk for serious complications.  Your personal target blood pressure may vary depending on your medical conditions, your age, and other factors. For most people, a normal blood pressure is less than 120/80.  Hypertension is treated with lifestyle changes, medicines, or a combination of both.  Lifestyle changes include losing weight, eating a healthy, low-sodium diet, exercising more, and limiting alcohol. This information is not intended to replace advice given to you by your health care provider. Make sure you discuss any questions you have with your health care provider. Document Revised: 08/23/2018 Document Reviewed: 08/23/2018 [employer] Patient Education  2021 [employer].

## 2021-03-24 NOTE — Progress Notes (Signed)
Patient ID: Jason [name], male    DOB: 11-28-1946  Age: 75 y.o. MRN: [id number]    Subjective:  Subjective  HPI Jason [name] presents for comprehensive physical exam today and follow up on management of chronic concerns. He states that he is doing well and denies any recent sicknesses. He reports that he experiences weakness and stiffness in his right knee and cannot bear weight on it. The symptoms are intermittent. He endorses that he fell a couple of months ago because he was carrying things while going down the stairs and he hit his left shoulder. He reports that it was sore, but denies any swelling or bruising in the left shoulder. He reports that a day after the fall his left thigh has been bruised. He denies any chest pain, fever, abdominal pain, cough, chills, sore throat, dysuria, urinary incontinence, back pain, HA, or N/VD  He reports that he has lost 10 pounds, but states that he was not trying to lose the weight. Although he also reports that he became slightly more physically active. He states that he does not drink as much water as he should and as a result he experiences lightheadedness. He reports experiencing SOB, but it is only when he climbs stairs and it is not heart related. He reports experiencing achy pain in his left shoulder around his pacemaker. He endorses that his BM movement and sounds are good. He denies taking the COVID-19 and shingles vaccines. He reports that his uncle has passed away recently from COVID-31 who had dementia.    Review of Systems  Constitutional: Negative for chills, fatigue and fever.  HENT: Negative for congestion, rhinorrhea, sinus pressure and sore throat.   Eyes: Negative for pain.  Cardiovascular: Negative for chest pain, palpitations and leg swelling.  Gastrointestinal: Negative for abdominal pain, blood in stool, diarrhea and nausea.  Genitourinary: Negative for flank pain, frequency and penile pain.  Musculoskeletal: Positive for joint  swelling. Negative for back pain.       (+) left shoulder pain  Neurological: Positive for light-headedness. Negative for headaches.    History Past Medical History:  Diagnosis Date  . Anemia 05/08/2015  . CML (chronic myelocytic leukemia) ([place]) 12/16/2011  . Coronary heart disease   . CVA (cerebrovascular accident) ([place])   . GERD (gastroesophageal reflux disease)   . Grief reaction 03/18/2014  . H/O tobacco use, presenting hazards to health 02/24/2012   No cigarettes since pacemaker placement. Encouraged ongoing efforts   . History of chicken pox   . History of kidney stones   . Hyperlipidemia   . Hypertension   . IBS (irritable bowel syndrome) 09/20/2013  . Medicare annual wellness visit, subsequent 03/23/2014  . PAF (paroxysmal atrial fibrillation) ([place])   . Symptomatic bradycardia    a. symptomatic bradycardia due to sinus node dysfunction s/p RA and RV [employer] pacemaker 04/15/16    He has a past surgical history that includes Cholecystectomy (2003); Coronary angioplasty with stent (2000); Cataract extraction; and Cardiac catheterization (N/A, 04/15/2016).   His family history includes Alcohol abuse in his father; Arthritis in his mother; Cancer in his mother; Diabetes in his son; Heart disease in his mother; Hypertension in his father and mother; Other in his mother; Rheumatic fever in his mother.He reports that he quit smoking about 4 years ago. His smoking use included cigarettes. He started smoking about 53 years ago. He has a 10.00 pack-year smoking history. He has never used smokeless tobacco. He reports that he does not  drink alcohol and does not use drugs.  Current Outpatient Medications on File Prior to Visit  Medication Sig Dispense Refill  . acetaminophen (TYLENOL) 325 MG tablet Take 650 mg by mouth every 6 (six) hours as needed. For headache.    Marland Kitchen amiodarone (PACERONE) 200 MG tablet TAKE 1/2 TABLET EVERY DAY 45 tablet 2  . amLODipine (NORVASC) 5 MG tablet Take 1 tablet  (5 mg total) by mouth daily. 90 tablet 2  . atorvastatin (LIPITOR) 40 MG tablet Take 1 tablet (40 mg total) by mouth daily. 90 tablet 2  . dicyclomine (BENTYL) 20 MG tablet Take 1 tablet (20 mg total) by mouth 2 (two) times daily. 20 tablet 0  . ferrous sulfate 325 (65 FE) MG EC tablet Take 1 tablet (325 mg total) by mouth 2 (two) times daily. 60 tablet 11  . fluticasone (FLONASE) 50 MCG/ACT nasal spray Place 2 sprays into both nostrils daily. 48 g 3  . imatinib ([name]) 400 MG tablet TAKE 1 TABLET (400MG  TOTAL) BY MOUTH DAILY. TAKE WITH A MEAL AND LARGE GLASS OF WATER. 30 tablet 6  . losartan (COZAAR) 100 MG tablet Take 1 tablet (100 mg total) by mouth daily. 90 tablet 1  . meclizine (ANTIVERT) 25 MG tablet TAKE 1 TABLET THREE TIMES DAILY 90 tablet 0  . metoprolol succinate (TOPROL XL) 100 MG 24 hr tablet Take 1 tablet (100 mg total) by mouth daily. 90 tablet 3  . nitroGLYCERIN (NITROSTAT) 0.4 MG SL tablet Place 1 tablet (0.4 mg total) under the tongue every 5 (five) minutes as needed for chest pain. 25 tablet 1  . ofloxacin (FLOXIN OTIC) 0.3 % OTIC solution Place 10 drops into both ears daily. For 7 days 10 mL 0  . omeprazole (PRILOSEC) 40 MG capsule TAKE 1 CAPSULE EVERY DAY (NEED OFFICE VISIT FOR GREATER QUANTITIES/REFILLS) 90 capsule 0  . ondansetron (ZOFRAN) 4 MG tablet Take 1 tablet (4 mg total) by mouth 2 (two) times daily as needed for nausea or vomiting. 180 tablet 1  . Probiotic Product (PROBIOTIC FORMULA PO) Take 1 capsule by mouth daily.     . Saccharomyces boulardii (PROBIOTIC) 250 MG CAPS Take 250 mg by mouth daily. 90 capsule 1  . XARELTO 20 MG TABS tablet TAKE 1 TABLET EVERY DAY WITH SUPPER 90 tablet 1   Current Facility-Administered Medications on File Prior to Visit  Medication Dose Route Frequency Provider Last Rate Last Admin  . cyanocobalamin ((VITAMIN B-12)) injection 1,000 mcg  1,000 mcg Intramuscular Q30 days Mosie Lukes, MD   1,000 mcg at 07/15/20 6811      Objective:  Objective  Physical Exam Constitutional:      General: He is not in acute distress.    Appearance: Normal appearance. He is not ill-appearing or toxic-appearing.  HENT:     Head: Normocephalic and atraumatic.     Right Ear: Tympanic membrane, ear canal and external ear normal.     Left Ear: Tympanic membrane, ear canal and external ear normal.     Nose: No congestion or rhinorrhea.  Eyes:     Extraocular Movements: Extraocular movements intact.     Pupils: Pupils are equal, round, and reactive to light.  Cardiovascular:     Rate and Rhythm: Normal rate and regular rhythm.     Pulses: Normal pulses.     Heart sounds: Normal heart sounds. No murmur heard.   Pulmonary:     Effort: Pulmonary effort is normal. No respiratory distress.  Breath sounds: Normal breath sounds. No wheezing, rhonchi or rales.  Abdominal:     General: Bowel sounds are normal.     Palpations: Abdomen is soft. There is no mass.     Tenderness: There is no abdominal tenderness. There is no guarding.     Hernia: No hernia is present.  Musculoskeletal:        General: Normal range of motion.     Cervical back: Normal range of motion and neck supple.  Skin:    General: Skin is warm and dry.  Neurological:     Mental Status: He is alert and oriented to person, place, and time.  Psychiatric:        Behavior: Behavior normal.    BP 130/76   Pulse 92   Temp 97.7 F (36.5 C)   Resp 16   Wt 185 lb 9.6 oz (84.2 kg)   SpO2 97%   BMI 28.22 kg/m  Wt Readings from Last 3 Encounters:  03/24/21 185 lb 9.6 oz (84.2 kg)  03/05/21 189 lb 3.2 oz (85.8 kg)  03/02/21 187 lb (84.8 kg)     Lab Results  Component Value Date   WBC 9.4 03/24/2021   HGB 13.5 03/24/2021   HCT 40.9 03/24/2021   PLT 216.0 03/24/2021   GLUCOSE 90 03/24/2021   CHOL 130 03/24/2021   TRIG 94.0 03/24/2021   HDL 37.20 (L) 03/24/2021   LDLCALC 74 03/24/2021   ALT 10 03/24/2021   AST 14 03/24/2021   NA 144 03/24/2021    K 4.4 03/24/2021   CL 109 03/24/2021   CREATININE 1.17 03/24/2021   BUN 9 03/24/2021   CO2 27 03/24/2021   TSH 6.92 (H) 03/24/2021   PSA 0.98 09/22/2018   INR 1.07 06/27/2014    VAS [place] AAA DUPLEX  Result Date: 09/23/2020 ABDOMINAL AORTA STUDY Indications: Follow up exam for known AAA. CRI, creatinine 1.23 mg/dL 09/11/20. Risk Factors: Hypertension, hyperlipidemia, past history of smoking, coronary               artery disease, prior CVA.  Comparison Study: 11/27/2019 mid - distal AAA, 4.36 cm. Performing Technologist: Geradine Girt  Examination Guidelines: A complete evaluation includes B-mode imaging, spectral Doppler, color Doppler, and power Doppler as needed of all accessible portions of each vessel. Bilateral testing is considered an integral part of a complete examination. Limited examinations for reoccurring indications may be performed as noted.  Abdominal Aorta Findings: +-----------+-------+----------+----------+--------+--------+--------+ Location   AP (cm)Trans (cm)PSV (cm/s)WaveformThrombusComments +-----------+-------+----------+----------+--------+--------+--------+ Proximal   2.87   3.61      93                                 +-----------+-------+----------+----------+--------+--------+--------+ Mid        4.47   3.50      62                        fusiform +-----------+-------+----------+----------+--------+--------+--------+ Distal     3.72   3.82      36                                 +-----------+-------+----------+----------+--------+--------+--------+ RT CIA Prox0.9    1.3       60        biphasic                 +-----------+-------+----------+----------+--------+--------+--------+  LT CIA Prox1.2    1.4       76        biphasic                 +-----------+-------+----------+----------+--------+--------+--------+  Summary: Abdominal Aorta: There is evidence of abnormal dilatation of the mid and distal Abdominal aorta. The largest aortic  measurement is 4.5 cm. The largest aortic diameter remains essentially unchanged compared to prior exam. Previous diameter measurement was  4.4 cm obtained on 11/27/19. Stenosis: Essentially stable size of infrarenal fusiform AAA.  *See table(s) above for measurements and observations.  Electronically signed by Shirlee More MD on 09/23/2020 at 2:01:18 PM.   Final    ECHOCARDIOGRAM COMPLETE  Result Date: 09/17/2020    ECHOCARDIOGRAM REPORT   Patient Name:   Jason [name] Date of Exam: 09/17/2020 Medical Rec #:  [id number]     Height:       68.0 in Accession #:    [id number]    Weight:       193.0 lb Date of Birth:  [birth year]     BSA:          2.013 m Patient Age:    77 years      BP:           132/80 mmHg Patient Gender: M             HR:           60 bpm. Exam Location:  [facility] Procedure: 2D Echo, 3D Echo, Cardiac Doppler and Color Doppler Indications:    I48.2 Chronic atrial fibrillation; I10 Hypertension  History:        Patient has prior history of Echocardiogram examinations, most                 recent 07/11/2019. CAD, Pacemaker, Arrythmias:Atrial                 Fibrillation; Risk Factors:Hypertension, Diabetes and Leukemia.  Sonographer:    Geradine Girt Referring Phys: [place]  1. Left ventricular ejection fraction, by estimation, is 45 to 50%. The left ventricle has mildly decreased function. The left ventricle has no regional wall motion abnormalities.  2. Aortic valve regurgitation is moderate. Mild aortic valve sclerosis is present, with no evidence of aortic valve stenosis.  3. There is mild dilatation of the ascending aorta, measuring 36 mm. FINDINGS  Left Ventricle: Left ventricular ejection fraction, by estimation, is 45 to 50%. The left ventricle has mildly decreased function. The left ventricle has no regional wall motion abnormalities. The left ventricular internal cavity size was normal in size. There is no left ventricular hypertrophy. Left ventricular diastolic  parameters are indeterminate. Right Ventricle: PPM lead artifact noted in right heart. The right ventricular size is normal. No increase in right ventricular wall thickness. Right ventricular systolic function is normal. There is normal pulmonary artery systolic pressure. The tricuspid regurgitant velocity is 2.71 m/s, and with an assumed right atrial pressure of 3 mmHg, the estimated right ventricular systolic pressure is 99.3 mmHg. Left Atrium: Left atrial size was normal in size. Right Atrium: Right atrial size was normal in size. Pericardium: There is no evidence of pericardial effusion. Mitral Valve: The mitral valve is normal in structure. No evidence of mitral valve regurgitation. No evidence of mitral valve stenosis. Tricuspid Valve: The tricuspid valve is normal in structure. Tricuspid valve regurgitation is mild . No evidence of tricuspid stenosis. Aortic Valve: The aortic valve was  not well visualized. Aortic valve regurgitation is moderate. Aortic regurgitation PHT measures 472 msec. Mild aortic valve sclerosis is present, with no evidence of aortic valve stenosis. Pulmonic Valve: The pulmonic valve was normal in structure. Pulmonic valve regurgitation is not visualized. No evidence of pulmonic stenosis. Aorta: The aortic root is normal in size and structure. There is mild dilatation of the ascending aorta, measuring 36 mm. Venous: The inferior vena cava is normal in size with greater than 50% respiratory variability, suggesting right atrial pressure of 3 mmHg. IAS/Shunts: No atrial level shunt detected by color flow Doppler.  LEFT VENTRICLE PLAX 2D LVIDd:         5.13 cm      Diastology LVIDs:         4.08 cm      LV e' medial:    3.26 cm/s LV PW:         1.06 cm      LV E/e' medial:  10.1 LV IVS:        1.42 cm      LV e' lateral:   3.37 cm/s LVOT diam:     2.00 cm      LV E/e' lateral: 9.8 LV SV:         76 LV SV Index:   38 LVOT Area:     3.14 cm                              3D Volume EF: LV Volumes  (MOD)            3D EF:        46 % LV vol d, MOD A4C: 176.0 ml LV EDV:       201 ml LV vol s, MOD A4C: 105.0 ml LV ESV:       108 ml LV SV MOD A4C:     176.0 ml LV SV:        93 ml RIGHT VENTRICLE RV S prime:     12.10 cm/s TAPSE (M-mode): 1.4 cm LEFT ATRIUM           Index       RIGHT ATRIUM          Index LA diam:      3.40 cm 1.69 cm/m  RA Area:     9.43 cm LA Vol (A2C): 43.5 ml 21.61 ml/m RA Volume:   16.60 ml 8.25 ml/m LA Vol (A4C): 24.1 ml 11.97 ml/m  AORTIC VALVE LVOT Vmax:   104.00 cm/s LVOT Vmean:  62.300 cm/s LVOT VTI:    0.243 m AI PHT:      472 msec  AORTA Ao Root diam: 4.10 cm Ao Asc diam:  3.60 cm MITRAL VALVE               TRICUSPID VALVE MV Area (PHT): 3.85 cm    TR Peak grad:   29.4 mmHg MV Decel Time: 197 msec    TR Vmax:        271.00 cm/s MV E velocity: 33.00 cm/s MV A velocity: 92.10 cm/s  SHUNTS MV E/A ratio:  0.36        Systemic VTI:  0.24 m                            Systemic Diam: 2.00 cm Jyl Heinz MD Electronically signed by Jyl Heinz MD Signature Date/Time:  09/17/2020/11:39:12 AM    Final      Assessment & Plan:  Plan    No orders of the defined types were placed in this encounter.   Problem List Items Addressed This Visit    CML (chronic myelocytic leukemia) ([place]) (Chronic)    Follows with oncology and is doing well      Hyperlipidemia, mixed    Tolerating statin, encouraged heart healthy diet, avoid trans fats, minimize simple carbs and saturated fats. Increase exercise as tolerated      Relevant Orders   Lipid panel (Completed)   PAF (paroxysmal atrial fibrillation) ([place])    He continues to follow with cardiology and he appears stable from a cardiac standpoint tolerating Amiodarone and Xarelto and asymptomatic      Hypertension    Well controlled, no changes to meds. Encouraged heart healthy diet such as the DASH diet and exercise as tolerated.       Relevant Orders   CBC (Completed)   Comprehensive metabolic panel (Completed)   TSH  (Completed)   Coronary artery disease    Follows with Dr Curt Bears and is doing well      Renal insufficiency    Hydrate and monitor      Relevant Orders   Comprehensive metabolic panel (Completed)   Vitamin B12 deficiency    Supplement and monitor      Relevant Orders   Vitamin B12 (Completed)   Hypothyroidism    .On Levothyroxine, continue to monitor      Right knee pain    Comes and goes, no injury and no pain today. He notes some weakness when he tries to carry things up stairs he is not interested in a referral at this time. 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 He was at the bottom stair so no serious injury occurred but he was sore on left side where he had hit first, some bruising on left thigh. All symptoms have resolved. He will let us know if this recures so we can consider PT and/or referral       Other Visit Diagnoses    Polyp of colon, unspecified part of colon, unspecified type    -  Primary   Relevant Orders   Ambulatory referral to Gastroenterology   Hypocalcemia       Relevant Orders   VITAMIN D 25 Hydroxy (Vit-D Deficiency, Fractures)      Colonoscopy: Last completed on 09/02/2015, polyps were found, repeat every 3 years.  Follow-up: Return in about 6 months (around 09/24/2021) for annual exam.  . I,David Hanna,acting as a scribe for Penni Homans, MD.,have documented all relevant documentation on the behalf of Penni Homans, MD,as directed by  Penni Homans, MD while in the presence of Penni Homans, MD.  I, Mosie Lukes, MD personally performed the services described in this documentation. All medical record entries made by the scribe were at my direction and in my presence. I have reviewed the chart and agree that the record reflects my personal performance and is accurate and complete

## 2021-03-24 NOTE — Assessment & Plan Note (Signed)
Follows with Dr Curt Bears and is doing well

## 2021-03-24 NOTE — Assessment & Plan Note (Signed)
Follows with oncology and is doing well

## 2021-03-24 NOTE — Assessment & Plan Note (Signed)
Well controlled, no changes to meds. Encouraged heart healthy diet such as the DASH diet and exercise as tolerated.  °

## 2021-03-24 NOTE — Assessment & Plan Note (Addendum)
Hydrate and monitor 

## 2021-03-25 ENCOUNTER — Telehealth: Payer: Self-pay | Admitting: Family Medicine

## 2021-03-25 NOTE — Telephone Encounter (Signed)
Patient is requesting a call back about medication change after recent labs. Patient has a few questions

## 2021-03-25 NOTE — Telephone Encounter (Signed)
Spoke with pt and clarified medication use. Pt wanted to know which medication was being increase which was synthroid and how many he should take of the 25 mcg medication until he was done with the script which was 2 tabs.

## 2021-03-25 NOTE — Assessment & Plan Note (Signed)
He continues to follow with cardiology and he appears stable from a cardiac standpoint tolerating Amiodarone and Xarelto and asymptomatic

## 2021-03-25 NOTE — Assessment & Plan Note (Signed)
Tolerating [name]

## 2021-03-30 ENCOUNTER — Ambulatory Visit (INDEPENDENT_AMBULATORY_CARE_PROVIDER_SITE_OTHER): Payer: Medicare Other

## 2021-03-30 DIAGNOSIS — I639 Cerebral infarction, unspecified: Secondary | ICD-10-CM | POA: Diagnosis not present

## 2021-03-31 LAB — CUP PACEART REMOTE DEVICE CHECK
Battery Remaining Longevity: 61 mo
Battery Voltage: 2.99 V
Brady Statistic AP VP Percent: 0.72 %
Brady Statistic AP VS Percent: 87.82 %
Brady Statistic AS VP Percent: 0.78 %
Brady Statistic AS VS Percent: 10.68 %
Brady Statistic RA Percent Paced: 74.29 %
Brady Statistic RV Percent Paced: 1.54 %
Date Time Interrogation Session: 20220404170139
Implantable Lead Implant Date: 20170420
Implantable Lead Implant Date: 20170420
Implantable Lead Location: 753859
Implantable Lead Location: 753860
Implantable Lead Model: 5076
Implantable Lead Model: 5076
Implantable Pulse Generator Implant Date: 20170420
Lead Channel Impedance Value: 342 Ohm
Lead Channel Impedance Value: 342 Ohm
Lead Channel Impedance Value: 399 Ohm
Lead Channel Impedance Value: 456 Ohm
Lead Channel Pacing Threshold Amplitude: 0.625 V
Lead Channel Pacing Threshold Amplitude: 0.625 V
Lead Channel Pacing Threshold Pulse Width: 0.4 ms
Lead Channel Pacing Threshold Pulse Width: 0.4 ms
Lead Channel Sensing Intrinsic Amplitude: 3 mV
Lead Channel Sensing Intrinsic Amplitude: 3 mV
Lead Channel Sensing Intrinsic Amplitude: 9.25 mV
Lead Channel Sensing Intrinsic Amplitude: 9.25 mV
Lead Channel Setting Pacing Amplitude: 2 V
Lead Channel Setting Pacing Amplitude: 2.5 V
Lead Channel Setting Pacing Pulse Width: 0.4 ms
Lead Channel Setting Sensing Sensitivity: 2 mV

## 2021-04-09 NOTE — Progress Notes (Signed)
Remote pacemaker transmission.   

## 2021-05-01 ENCOUNTER — Other Ambulatory Visit: Payer: Self-pay

## 2021-05-01 ENCOUNTER — Telehealth: Payer: Self-pay | Admitting: Cardiology

## 2021-05-01 MED ORDER — RIVAROXABAN 20 MG PO TABS: ORAL_TABLET | ORAL | 1 refills | Status: DC

## 2021-05-01 MED ORDER — AMLODIPINE BESYLATE 5 MG PO TABS
5.0000 mg | ORAL_TABLET | Freq: Every day | ORAL | 1 refills | Status: DC
Start: 2021-05-01 — End: 2021-10-08

## 2021-05-01 NOTE — Telephone Encounter (Signed)
   *  STAT* If patient is at the pharmacy, call can be transferred to refill team.   1. Which medications need to be refilled? (please list name of each medication and dose if known)   amLODipine (NORVASC) 5 MG tablet    XARELTO 20 MG TABS tablet    2. Which pharmacy/location (including street and city if local pharmacy) is medication to be sent to?   Send Amlodipine to [place] - pt is completely out of meds  Send Xarelto to [place], [place]  3. Do they need a 30 day or 90 day supply? Both for 90 days

## 2021-05-01 NOTE — Telephone Encounter (Signed)
Called patient advised of no changes per last office note.  Patient verbalized understanding.   He also needs Xarelto sent to pharmacy for refill- will route to pharmacy.  Amlodipine sent to pharmacy.

## 2021-05-01 NOTE — Telephone Encounter (Signed)
Addressed in previous encounter.

## 2021-05-01 NOTE — Telephone Encounter (Signed)
97m, 84.2kg, scr 1.17 03/24/21, lovw/camnitz 03/02/21

## 2021-05-01 NOTE — Telephone Encounter (Signed)
     Pt c/o medication issue:  1. Name of Medication: amLODipine (NORVASC) 5 MG tablet  2. How are you currently taking this medication (dosage and times per day)? Take 1 tablet (5 mg total) by mouth daily.  3. Are you having a reaction (difficulty breathing--STAT)?   4. What is your medication issue? Pt would like to speak with a nurse regarding this medication, he just wanted to know if [name] wants him to continue taking this or it just a "one time thing".

## 2021-06-30 ENCOUNTER — Ambulatory Visit (INDEPENDENT_AMBULATORY_CARE_PROVIDER_SITE_OTHER): Payer: Medicare Other

## 2021-06-30 DIAGNOSIS — I495 Sick sinus syndrome: Secondary | ICD-10-CM

## 2021-07-01 LAB — CUP PACEART REMOTE DEVICE CHECK
Battery Remaining Longevity: 53 mo
Battery Voltage: 2.99 V
Brady Statistic AP VP Percent: 0.82 %
Brady Statistic AP VS Percent: 89.09 %
Brady Statistic AS VP Percent: 0.63 %
Brady Statistic AS VS Percent: 9.47 %
Brady Statistic RA Percent Paced: 67.26 %
Brady Statistic RV Percent Paced: 1.5 %
Date Time Interrogation Session: 20220706134319
Implantable Lead Implant Date: 20170420
Implantable Lead Implant Date: 20170420
Implantable Lead Location: 753859
Implantable Lead Location: 753860
Implantable Lead Model: 5076
Implantable Lead Model: 5076
Implantable Pulse Generator Implant Date: 20170420
Lead Channel Impedance Value: 342 Ohm
Lead Channel Impedance Value: 342 Ohm
Lead Channel Impedance Value: 399 Ohm
Lead Channel Impedance Value: 475 Ohm
Lead Channel Pacing Threshold Amplitude: 0.625 V
Lead Channel Pacing Threshold Amplitude: 0.75 V
Lead Channel Pacing Threshold Pulse Width: 0.4 ms
Lead Channel Pacing Threshold Pulse Width: 0.4 ms
Lead Channel Sensing Intrinsic Amplitude: 1.875 mV
Lead Channel Sensing Intrinsic Amplitude: 1.875 mV
Lead Channel Sensing Intrinsic Amplitude: 9.25 mV
Lead Channel Sensing Intrinsic Amplitude: 9.25 mV
Lead Channel Setting Pacing Amplitude: 2 V
Lead Channel Setting Pacing Amplitude: 2.5 V
Lead Channel Setting Pacing Pulse Width: 0.4 ms
Lead Channel Setting Sensing Sensitivity: 2 mV

## 2021-07-02 ENCOUNTER — Encounter: Payer: Self-pay | Admitting: Hematology & Oncology

## 2021-07-02 ENCOUNTER — Other Ambulatory Visit: Payer: Self-pay

## 2021-07-02 ENCOUNTER — Inpatient Hospital Stay: Payer: Medicare Other | Attending: Hematology & Oncology

## 2021-07-02 ENCOUNTER — Telehealth: Payer: Self-pay | Admitting: *Deleted

## 2021-07-02 ENCOUNTER — Inpatient Hospital Stay (HOSPITAL_BASED_OUTPATIENT_CLINIC_OR_DEPARTMENT_OTHER): Payer: Medicare Other | Admitting: Hematology & Oncology

## 2021-07-02 VITALS — BP 121/73 | HR 87 | Temp 97.8°F | Resp 17 | Wt 183.0 lb

## 2021-07-02 DIAGNOSIS — C9210 Chronic myeloid leukemia, BCR/ABL-positive, not having achieved remission: Secondary | ICD-10-CM

## 2021-07-02 DIAGNOSIS — R110 Nausea: Secondary | ICD-10-CM | POA: Diagnosis not present

## 2021-07-02 DIAGNOSIS — Z7901 Long term (current) use of anticoagulants: Secondary | ICD-10-CM | POA: Diagnosis not present

## 2021-07-02 DIAGNOSIS — I480 Paroxysmal atrial fibrillation: Secondary | ICD-10-CM | POA: Insufficient documentation

## 2021-07-02 DIAGNOSIS — Z79899 Other long term (current) drug therapy: Secondary | ICD-10-CM | POA: Insufficient documentation

## 2021-07-02 DIAGNOSIS — C9211 Chronic myeloid leukemia, BCR/ABL-positive, in remission: Secondary | ICD-10-CM | POA: Diagnosis not present

## 2021-07-02 LAB — CBC WITH DIFFERENTIAL (CANCER CENTER ONLY)
Abs Immature Granulocytes: 0.02 10*3/uL (ref 0.00–0.07)
Basophils Absolute: 0.1 10*3/uL (ref 0.0–0.1)
Basophils Relative: 2 %
Eosinophils Absolute: 0.4 10*3/uL (ref 0.0–0.5)
Eosinophils Relative: 5 %
HCT: 39.1 % (ref 39.0–52.0)
Hemoglobin: 12.9 g/dL — ABNORMAL LOW (ref 13.0–17.0)
Immature Granulocytes: 0 %
Lymphocytes Relative: 18 %
Lymphs Abs: 1.5 10*3/uL (ref 0.7–4.0)
MCH: 31.5 pg (ref 26.0–34.0)
MCHC: 33 g/dL (ref 30.0–36.0)
MCV: 95.6 fL (ref 80.0–100.0)
Monocytes Absolute: 0.6 10*3/uL (ref 0.1–1.0)
Monocytes Relative: 7 %
Neutro Abs: 5.6 10*3/uL (ref 1.7–7.7)
Neutrophils Relative %: 68 %
Platelet Count: 220 10*3/uL (ref 150–400)
RBC: 4.09 MIL/uL — ABNORMAL LOW (ref 4.22–5.81)
RDW: 14.2 % (ref 11.5–15.5)
WBC Count: 8.1 10*3/uL (ref 4.0–10.5)
nRBC: 0 % (ref 0.0–0.2)

## 2021-07-02 LAB — CMP (CANCER CENTER ONLY)
ALT: 6 U/L (ref 0–44)
AST: 13 U/L — ABNORMAL LOW (ref 15–41)
Albumin: 3.9 g/dL (ref 3.5–5.0)
Alkaline Phosphatase: 79 U/L (ref 38–126)
Anion gap: 9 (ref 5–15)
BUN: 12 mg/dL (ref 8–23)
CO2: 28 mmol/L (ref 22–32)
Calcium: 9.2 mg/dL (ref 8.9–10.3)
Chloride: 108 mmol/L (ref 98–111)
Creatinine: 1.37 mg/dL — ABNORMAL HIGH (ref 0.61–1.24)
GFR, Estimated: 54 mL/min — ABNORMAL LOW (ref >60)
Glucose, Bld: 99 mg/dL (ref 70–99)
Potassium: 4.1 mmol/L (ref 3.5–5.1)
Sodium: 145 mmol/L (ref 135–145)
Total Bilirubin: 0.4 mg/dL (ref 0.3–1.2)
Total Protein: 6.7 g/dL (ref 6.5–8.1)

## 2021-07-02 LAB — LACTATE DEHYDROGENASE: LDH: 194 U/L — ABNORMAL HIGH (ref 98–192)

## 2021-07-02 LAB — SAVE SMEAR(SSMR), FOR PROVIDER SLIDE REVIEW

## 2021-07-02 MED ORDER — ONDANSETRON HCL 4 MG PO TABS: 4.0000 mg | ORAL_TABLET | Freq: Two times a day (BID) | ORAL | 1 refills | Status: DC | PRN

## 2021-07-02 NOTE — Telephone Encounter (Signed)
Per 07/02/21 los - gave patient upcoming appointment - patient confirmed

## 2021-07-02 NOTE — Progress Notes (Signed)
Hematology and Oncology Follow Up Visit  Jason [name] [id number] 08-Feb-1946 75 y.o. 07/02/2021   Principle Diagnosis:  Chronic phase CML - MMR Paroxysmal atrial fibrillation - has pacemaker   Current Therapy:   [name] 400 mg by mouth daily -- started on 09/2019 Xarelto 20 mg by mouth daily   Interim History:  Jason [name] for follow-up.  Overall, he is doing pretty well.  We saw him 6 months ago.  When we last saw him in January, the BCR/ABL was down to 0.01%.  He has had no problems with the Jason [name].  He does have atrial fibrillation.  He is on amiodarone for this.  There is been no problems with fever.  He has had no issues with COVID.  He has not had any of the COVID vaccines.  He has had no bleeding.  He is on Xarelto.  There is been no issues with rashes.  He has had no leg swelling.  His big problem is that his 2 trucks are broken down.  He really cannot go anywhere.  He needs his family to drive him places.  Currently, his performance status is Jason [name] 1.   Medications:  Allergies as of 07/02/2021   No Known Allergies      Medication List        Accurate as of July 02, 2021 11:36 AM. If you have any questions, ask your nurse or doctor.          acetaminophen 325 MG tablet Commonly known as: TYLENOL Take 650 mg by mouth every 6 (six) hours as needed. For headache.   amiodarone 200 MG tablet Commonly known as: PACERONE TAKE 1/2 TABLET EVERY DAY   amLODipine 5 MG tablet Commonly known as: NORVASC Take 1 tablet (5 mg total) by mouth daily.   atorvastatin 40 MG tablet Commonly known as: LIPITOR Take 1 tablet (40 mg total) by mouth daily.   dicyclomine 20 MG tablet Commonly known as: BENTYL Take 1 tablet (20 mg total) by mouth 2 (two) times daily.   ferrous sulfate 325 (65 FE) MG EC tablet Take 1 tablet (325 mg total) by mouth 2 (two) times daily.   fluticasone 50 MCG/ACT nasal spray Commonly known as: FLONASE Place 2 sprays into both  nostrils daily.   imatinib 400 MG tablet Commonly known as: [name] TAKE 1 TABLET (400MG TOTAL) BY MOUTH DAILY. TAKE WITH A MEAL AND LARGE GLASS OF WATER.   levothyroxine 50 MCG tablet Commonly known as: SYNTHROID Take 1 tablet (50 mcg total) by mouth daily before breakfast.   losartan 100 MG tablet Commonly known as: COZAAR Take 1 tablet (100 mg total) by mouth daily.   meclizine 25 MG tablet Commonly known as: ANTIVERT TAKE 1 TABLET THREE TIMES DAILY   metoprolol succinate 100 MG 24 hr tablet Commonly known as: Toprol XL Take 1 tablet (100 mg total) by mouth daily.   nitroGLYCERIN 0.4 MG SL tablet Commonly known as: NITROSTAT Place 1 tablet (0.4 mg total) under the tongue every 5 (five) minutes as needed for chest pain.   ofloxacin 0.3 % OTIC solution Commonly known as: Floxin Otic Place 10 drops into both ears daily. For 7 days   omeprazole 40 MG capsule Commonly known as: PRILOSEC TAKE 1 CAPSULE EVERY DAY (NEED OFFICE VISIT FOR GREATER QUANTITIES/REFILLS)   ondansetron 4 MG tablet Commonly known as: ZOFRAN Take 1 tablet (4 mg total) by mouth 2 (two) times daily as needed for nausea or vomiting.  Probiotic 250 MG Caps Take 250 mg by mouth daily.   PROBIOTIC FORMULA PO Take 1 capsule by mouth daily.   rivaroxaban 20 MG Tabs tablet Commonly known as: Xarelto TAKE 1 TABLET EVERY DAY WITH SUPPER        Allergies: No Known Allergies  Past Medical History, Surgical history, Social history, and Family History were reviewed and updated.  Review of Systems: Review of Systems  Constitutional: Negative.   HENT: Negative.    Eyes: Negative.   Respiratory: Negative.    Cardiovascular: Negative.   Gastrointestinal: Negative.   Genitourinary: Negative.   Musculoskeletal: Negative.   Skin: Negative.   Neurological: Negative.   Endo/Heme/Allergies: Negative.   Psychiatric/Behavioral: Negative.      Physical Exam:  weight is 183 lb (83 kg). His oral  temperature is 97.8 F (36.6 C). His blood pressure is 121/73 and his pulse is 87. His respiration is 17 and oxygen saturation is 92%.   Wt Readings from Last 3 Encounters:  07/02/21 183 lb (83 kg)  03/24/21 185 lb 9.6 oz (84.2 kg)  03/05/21 189 lb 3.2 oz (85.8 kg)    Physical Exam Vitals reviewed.  HENT:     Head: Normocephalic and atraumatic.  Eyes:     Pupils: Pupils are equal, round, and reactive to light.  Cardiovascular:     Rate and Rhythm: Normal rate and regular rhythm.     Heart sounds: Normal heart sounds.  Pulmonary:     Effort: Pulmonary effort is normal.     Breath sounds: Normal breath sounds.  Abdominal:     General: Bowel sounds are normal.     Palpations: Abdomen is soft.  Musculoskeletal:        General: No tenderness or deformity. Normal range of motion.     Cervical back: Normal range of motion.  Lymphadenopathy:     Cervical: No cervical adenopathy.  Skin:    General: Skin is warm and dry.     Findings: No erythema or rash.  Neurological:     Mental Status: He is alert and oriented to person, place, and time.  Psychiatric:        Behavior: Behavior normal.        Thought Content: Thought content normal.        Judgment: Judgment normal.     Lab Results  Component Value Date   WBC 8.1 07/02/2021   HGB 12.9 (L) 07/02/2021   HCT 39.1 07/02/2021   MCV 95.6 07/02/2021   PLT 220 07/02/2021   Lab Results  Component Value Date   IRON 84 05/27/2015   TIBC 359 05/27/2015   UIBC 275 05/27/2015   IRONPCTSAT 23 05/27/2015   Lab Results  Component Value Date   RETICCTPCT 1.2 05/27/2015   RBC 4.09 (L) 07/02/2021   RETICCTABS 48.2 05/27/2015   No results found for: KPAFRELGTCHN, LAMBDASER, KAPLAMBRATIO No results found for: IGGSERUM, IGA, IGMSERUM No results found for: Odetta Pink, SPEI   Chemistry      Component Value Date/Time   NA 145 07/02/2021 1001   NA 141 12/13/2019 0955   NA 143  11/26/2016 1030   K 4.1 07/02/2021 1001   K 3.8 11/26/2016 1030   CL 108 07/02/2021 1001   CL 109 (H) 03/31/2015 1119   CO2 28 07/02/2021 1001   CO2 25 11/26/2016 1030   BUN 12 07/02/2021 1001   BUN 13 12/13/2019 0955   BUN 5.1 (L) 11/26/2016 1030  CREATININE 1.37 (H) 07/02/2021 1001   CREATININE 1.23 (H) 09/11/2020 1125   CREATININE 1.0 11/26/2016 1030      Component Value Date/Time   CALCIUM 9.2 07/02/2021 1001   CALCIUM 9.3 11/26/2016 1030   ALKPHOS 79 07/02/2021 1001   ALKPHOS 106 11/26/2016 1030   AST 13 (L) 07/02/2021 1001   AST 16 11/26/2016 1030   ALT 6 07/02/2021 1001   ALT 10 11/26/2016 1030   BILITOT 0.4 07/02/2021 1001   BILITOT 0.53 11/26/2016 1030      Impression and Plan: Mr. Malek is a very pleasant 75yo caucasian male with chronic phase CML.  He was only off [place] for about 4 months before he recurred.  I would have to say his back in remission now.  I am happy about this.  He is tolerating [name] incredibly well.  I have to believe that his prognosis is going be based upon the cardiac issues.  He has a pacemaker in.  His atrial fibrillation.  I will plan to see him back in 6 months.    Volanda Napoleon, MD 7/7/202211:36 AM

## 2021-07-07 ENCOUNTER — Telehealth: Payer: Self-pay | Admitting: Cardiology

## 2021-07-07 ENCOUNTER — Telehealth: Payer: Self-pay

## 2021-07-07 ENCOUNTER — Other Ambulatory Visit: Payer: Self-pay | Admitting: *Deleted

## 2021-07-07 ENCOUNTER — Other Ambulatory Visit: Payer: Self-pay

## 2021-07-07 ENCOUNTER — Telehealth: Payer: Self-pay | Admitting: Family Medicine

## 2021-07-07 MED ORDER — OMEPRAZOLE 40 MG PO CPDR: DELAYED_RELEASE_CAPSULE | ORAL | 0 refills | Status: DC

## 2021-07-07 MED ORDER — FLUTICASONE PROPIONATE 50 MCG/ACT NA SUSP: 2.0000 | Freq: Every day | NASAL | 3 refills | Status: AC

## 2021-07-07 MED ORDER — AMIODARONE HCL 200 MG PO TABS
ORAL_TABLET | ORAL | 1 refills | Status: DC
Start: 2021-07-07 — End: 2021-10-08

## 2021-07-07 NOTE — Telephone Encounter (Signed)
Medication sent.

## 2021-07-07 NOTE — Telephone Encounter (Signed)
*  STAT* If patient is at the pharmacy, call can be transferred to refill team.   1. Which medications need to be refilled? (please list name of each medication and dose if known) amiodarone (PACERONE) 200 MG tablet  2. Which pharmacy/location (including street and city if local pharmacy) is medication to be sent to? [place]  3. Do they need a 30 day or 90 day supply? 90 day supply   The pt is out of this medication

## 2021-07-07 NOTE — Telephone Encounter (Signed)
Patient would like a 90 day supply  Medication: fluticasone (FLONASE) 50 MCG/ACT nasal spray [[id number]]   omeprazole (PRILOSEC) 40 MG capsule [[id number]]     Has the patient contacted their pharmacy? Yes.   (If no, request that the patient contact the pharmacy for the refill.) (If yes, when and what did the pharmacy advise?) call your doc     Preferred Pharmacy (with phone number or street name): [facility], [place] [address]  Phone:  [phone]  Fax:  [phone]      Agent: Please be advised that RX refills may take up to 3 business days. We ask that you follow-up with your pharmacy.

## 2021-07-07 NOTE — Telephone Encounter (Signed)
Rx has been sent in as requested. Confirmation received. 

## 2021-07-07 NOTE — Telephone Encounter (Signed)
Patient called in wanting refills on medications he did no specify. Patient would like a phone call

## 2021-07-10 LAB — BCR/ABL

## 2021-07-21 NOTE — Progress Notes (Signed)
Remote pacemaker transmission.   

## 2021-07-27 ENCOUNTER — Telehealth: Payer: Self-pay

## 2021-07-27 MED ORDER — LOSARTAN POTASSIUM 100 MG PO TABS: 100.0000 mg | ORAL_TABLET | Freq: Every day | ORAL | 1 refills | Status: DC

## 2021-07-27 NOTE — Telephone Encounter (Signed)
Patient notified that rx has been sent in. 

## 2021-07-27 NOTE — Telephone Encounter (Signed)
Medication: Losartan '100mg'$   Has the patient contacted their pharmacy? No. (If no, request that the patient contact the pharmacy for the refill.) *Pt uses [facility] he says, but this one he is requesting [facility] (If yes, when and what did the pharmacy advise?)  Preferred Pharmacy (with phone number or street name): [facility] in [place]  Agent: Please be advised that RX refills may take up to 3 business days. We ask that you follow-up with your pharmacy.

## 2021-07-31 ENCOUNTER — Emergency Department (HOSPITAL_BASED_OUTPATIENT_CLINIC_OR_DEPARTMENT_OTHER): Payer: Medicare Other

## 2021-07-31 ENCOUNTER — Encounter (HOSPITAL_BASED_OUTPATIENT_CLINIC_OR_DEPARTMENT_OTHER): Payer: Self-pay | Admitting: *Deleted

## 2021-07-31 ENCOUNTER — Emergency Department (HOSPITAL_BASED_OUTPATIENT_CLINIC_OR_DEPARTMENT_OTHER)
Admission: EM | Admit: 2021-07-31 | Discharge: 2021-07-31 | Disposition: A | Discharge status: Home / Self Care | Payer: Medicare Other | Attending: Emergency Medicine | Admitting: Emergency Medicine

## 2021-07-31 ENCOUNTER — Other Ambulatory Visit: Payer: Self-pay

## 2021-07-31 DIAGNOSIS — Z79899 Other long term (current) drug therapy: Secondary | ICD-10-CM | POA: Diagnosis not present

## 2021-07-31 DIAGNOSIS — R600 Localized edema: Secondary | ICD-10-CM | POA: Insufficient documentation

## 2021-07-31 DIAGNOSIS — J320 Chronic maxillary sinusitis: Secondary | ICD-10-CM | POA: Insufficient documentation

## 2021-07-31 DIAGNOSIS — Z87891 Personal history of nicotine dependence: Secondary | ICD-10-CM | POA: Diagnosis not present

## 2021-07-31 DIAGNOSIS — I10 Essential (primary) hypertension: Secondary | ICD-10-CM | POA: Diagnosis not present

## 2021-07-31 DIAGNOSIS — Z955 Presence of coronary angioplasty implant and graft: Secondary | ICD-10-CM | POA: Diagnosis not present

## 2021-07-31 DIAGNOSIS — E876 Hypokalemia: Secondary | ICD-10-CM | POA: Diagnosis not present

## 2021-07-31 DIAGNOSIS — R42 Dizziness and giddiness: Secondary | ICD-10-CM

## 2021-07-31 DIAGNOSIS — I2510 Atherosclerotic heart disease of native coronary artery without angina pectoris: Secondary | ICD-10-CM | POA: Insufficient documentation

## 2021-07-31 DIAGNOSIS — E039 Hypothyroidism, unspecified: Secondary | ICD-10-CM | POA: Insufficient documentation

## 2021-07-31 DIAGNOSIS — Z20822 Contact with and (suspected) exposure to covid-19: Secondary | ICD-10-CM | POA: Insufficient documentation

## 2021-07-31 LAB — CBC WITH DIFFERENTIAL/PLATELET
Abs Immature Granulocytes: 0.03 10*3/uL (ref 0.00–0.07)
Basophils Absolute: 0.1 10*3/uL (ref 0.0–0.1)
Basophils Relative: 1 %
Eosinophils Absolute: 0.3 10*3/uL (ref 0.0–0.5)
Eosinophils Relative: 4 %
HCT: 40.7 % (ref 39.0–52.0)
Hemoglobin: 13.9 g/dL (ref 13.0–17.0)
Immature Granulocytes: 0 %
Lymphocytes Relative: 19 %
Lymphs Abs: 1.6 10*3/uL (ref 0.7–4.0)
MCH: 31.3 pg (ref 26.0–34.0)
MCHC: 34.2 g/dL (ref 30.0–36.0)
MCV: 91.7 fL (ref 80.0–100.0)
Monocytes Absolute: 0.6 10*3/uL (ref 0.1–1.0)
Monocytes Relative: 7 %
Neutro Abs: 5.6 10*3/uL (ref 1.7–7.7)
Neutrophils Relative %: 69 %
Platelets: 200 10*3/uL (ref 150–400)
RBC: 4.44 MIL/uL (ref 4.22–5.81)
RDW: 14.5 % (ref 11.5–15.5)
WBC: 8.3 10*3/uL (ref 4.0–10.5)
nRBC: 0 % (ref 0.0–0.2)

## 2021-07-31 LAB — URINALYSIS, ROUTINE W REFLEX MICROSCOPIC
Bilirubin Urine: NEGATIVE
Glucose, UA: NEGATIVE mg/dL
Hgb urine dipstick: NEGATIVE
Ketones, ur: NEGATIVE mg/dL
Leukocytes,Ua: NEGATIVE
Nitrite: NEGATIVE
Protein, ur: NEGATIVE mg/dL
Specific Gravity, Urine: <1.005 — ABNORMAL LOW (ref 1.005–1.030)
pH: 6.5 (ref 5.0–8.0)

## 2021-07-31 LAB — COMPREHENSIVE METABOLIC PANEL
ALT: 11 U/L (ref 0–44)
AST: 19 U/L (ref 15–41)
Albumin: 3.7 g/dL (ref 3.5–5.0)
Alkaline Phosphatase: 78 U/L (ref 38–126)
Anion gap: 10 (ref 5–15)
BUN: 11 mg/dL (ref 8–23)
CO2: 23 mmol/L (ref 22–32)
Calcium: 8.4 mg/dL — ABNORMAL LOW (ref 8.9–10.3)
Chloride: 108 mmol/L (ref 98–111)
Creatinine, Ser: 1.02 mg/dL (ref 0.61–1.24)
GFR, Estimated: >60 mL/min (ref >60)
Glucose, Bld: 93 mg/dL (ref 70–99)
Potassium: 2.8 mmol/L — ABNORMAL LOW (ref 3.5–5.1)
Sodium: 141 mmol/L (ref 135–145)
Total Bilirubin: 0.6 mg/dL (ref 0.3–1.2)
Total Protein: 7 g/dL (ref 6.5–8.1)

## 2021-07-31 LAB — I-STAT VENOUS BLOOD GAS, ED
Acid-Base Excess: 1 mmol/L (ref 0.0–2.0)
Bicarbonate: 24.7 mmol/L (ref 20.0–28.0)
Calcium, Ion: 1.18 mmol/L (ref 1.15–1.40)
HCT: 40 % (ref 39.0–52.0)
Hemoglobin: 13.6 g/dL (ref 13.0–17.0)
O2 Saturation: 66 %
Potassium: 3 mmol/L — ABNORMAL LOW (ref 3.5–5.1)
Sodium: 144 mmol/L (ref 135–145)
TCO2: 26 mmol/L (ref 22–32)
pCO2, Ven: 35.7 mmHg — ABNORMAL LOW (ref 44.0–60.0)
pH, Ven: 7.449 — ABNORMAL HIGH (ref 7.250–7.430)
pO2, Ven: 32 mmHg (ref 32.0–45.0)

## 2021-07-31 LAB — PROTIME-INR
INR: 1.3 — ABNORMAL HIGH (ref 0.8–1.2)
Prothrombin Time: 16.3 seconds — ABNORMAL HIGH (ref 11.4–15.2)

## 2021-07-31 LAB — TROPONIN I (HIGH SENSITIVITY)
Troponin I (High Sensitivity): 12 ng/L (ref <18)
Troponin I (High Sensitivity): 12 ng/L (ref <18)

## 2021-07-31 LAB — LACTIC ACID, PLASMA
Lactic Acid, Venous: 1.2 mmol/L (ref 0.5–1.9)
Lactic Acid, Venous: 1.3 mmol/L (ref 0.5–1.9)

## 2021-07-31 LAB — RESP PANEL BY RT-PCR (FLU A&B, COVID) ARPGX2
Influenza A by PCR: NEGATIVE
Influenza B by PCR: NEGATIVE
SARS Coronavirus 2 by RT PCR: NEGATIVE

## 2021-07-31 LAB — PHOSPHORUS: Phosphorus: 2.4 mg/dL — ABNORMAL LOW (ref 2.5–4.6)

## 2021-07-31 LAB — LIPASE, BLOOD: Lipase: 165 U/L — ABNORMAL HIGH (ref 11–51)

## 2021-07-31 LAB — BRAIN NATRIURETIC PEPTIDE: B Natriuretic Peptide: 318.5 pg/mL — ABNORMAL HIGH (ref 0.0–100.0)

## 2021-07-31 LAB — MAGNESIUM: Magnesium: 1.2 mg/dL — ABNORMAL LOW (ref 1.7–2.4)

## 2021-07-31 MED ORDER — POTASSIUM CHLORIDE CRYS ER 20 MEQ PO TBCR
40.0000 meq | EXTENDED_RELEASE_TABLET | Freq: Once | ORAL | Status: AC
  Administered 2021-07-31: 40 meq via ORAL
  Filled 2021-07-31: qty 2

## 2021-07-31 MED ORDER — POTASSIUM CHLORIDE CRYS ER 20 MEQ PO TBCR: 20.0000 meq | EXTENDED_RELEASE_TABLET | Freq: Three times a day (TID) | ORAL | 0 refills | Status: DC

## 2021-07-31 MED ORDER — POTASSIUM CHLORIDE 10 MEQ/100ML IV SOLN
10.0000 meq | Freq: Once | INTRAVENOUS | Status: AC
  Administered 2021-07-31: 10 meq via INTRAVENOUS
  Filled 2021-07-31: qty 100

## 2021-07-31 MED ORDER — IOHEXOL 350 MG/ML SOLN
100.0000 mL | Freq: Once | INTRAVENOUS | Status: AC | PRN
  Administered 2021-07-31: 100 mL via INTRAVENOUS

## 2021-07-31 MED ORDER — MAGNESIUM SULFATE 2 GM/50ML IV SOLN
2.0000 g | Freq: Once | INTRAVENOUS | Status: AC
  Administered 2021-07-31: 2 g via INTRAVENOUS
  Filled 2021-07-31: qty 50

## 2021-07-31 MED ORDER — AMOXICILLIN-POT CLAVULANATE 875-125 MG PO TABS: 1.0000 | ORAL_TABLET | Freq: Two times a day (BID) | ORAL | 0 refills | Status: DC

## 2021-07-31 MED ORDER — MAGNESIUM OXIDE 400 MG PO TABS: 400.0000 mg | ORAL_TABLET | Freq: Every day | ORAL | 0 refills | Status: DC

## 2021-07-31 NOTE — ED Provider Notes (Signed)
[name] Provider Note   CSN: [id number] Arrival date & time: 07/31/21  1632     History Chief Complaint  Patient presents with   Dizziness    [name] is a 75 y.o. male.  HPI Patient reports has been feeling dizzy for about 2 days.  He does not endorse exactly vertiginous quality.  He does report he just gets feeling kind of lightheaded and like he might pass out.  At other times, the dizziness is associated with nausea that seems to start in the pit of his stomach and then results in dizziness and a generalized headache.  He indicates the front of his forehead where he typically might get a headache.  He reports that he takes Tylenol and that goes away but then it comes back again.  He reports the symptoms have been coming and going for about 2 days.  Reports he takes something for the headache and it seems to go away but then he will have a rebound of the same symptoms again.  Patient reports that he did do some mowing of his lawn yesterday.  He has a riding mower so he denies that it is significantly exertional but he reports when he came in the house after riding he ate but then he did start to feel dizzy again.  No syncopal episodes.  Patient reports he has not had any vomiting.  He reports he is eating pretty normally.  Reports his stools are slightly loose but not really diarrheal.  He reports they are always dark because he takes iron.  He has not noted specific change.  He does not think he has had any recent medication changes.  He reports a few months ago he was started on thyroid medication.    Past Medical History:  Diagnosis Date   Anemia 05/08/2015   CML (chronic myelocytic leukemia) ([place]) 12/16/2011   Coronary heart disease    CVA (cerebrovascular accident) ([place])    GERD (gastroesophageal reflux disease)    Grief reaction 03/18/2014   H/O tobacco use, presenting hazards to health 02/24/2012   No cigarettes since pacemaker placement.  Encouraged ongoing efforts    History of chicken pox    History of kidney stones    Hyperlipidemia    Hypertension    IBS (irritable bowel syndrome) 09/20/2013   Medicare annual wellness visit, subsequent 03/23/2014   PAF (paroxysmal atrial fibrillation) ([facility])    Symptomatic bradycardia    a. symptomatic bradycardia due to sinus node dysfunction s/p RA and RV Medtronic pacemaker 04/15/16    Patient Active Problem List   Diagnosis Date Noted   Right knee pain 03/24/2021   Fall 03/24/2021   Hypothyroidism 09/11/2020   Vitamin B12 deficiency 03/09/2020   Renal insufficiency 03/06/2020   Headache 03/26/2019   Symptomatic bradycardia    PAF (paroxysmal atrial fibrillation) ([facility])    History of kidney stones    Hypertension    History of chicken pox    GERD (gastroesophageal reflux disease)    CVA (cerebrovascular accident) ([place])    Sun-damaged skin 09/18/2017   Otitis externa 09/18/2017   Tachy-brady syndrome ([place]) 04/16/2016   Anemia 05/08/2015   Abdominal aortic aneurysm ([place]) 03/19/2015   Dizziness 10/13/2014   Bruit 08/07/2014   Chest pain with moderate risk of acute coronary syndrome 07/15/2014   Chronic anticoagulation 07/15/2014   History of CVA (cerebrovascular accident) 07/15/2014   Preventative health care 03/23/2014   Medicare annual wellness visit, subsequent 03/23/2014  Grief reaction 03/18/2014   IBS (irritable bowel syndrome) 09/20/2013   Hypokalemia 09/09/2012   Corneal edema 09/08/2012   Pseudophakia 09/08/2012   Coronary artery disease 08/31/2012   Leukemia ([place]) 08/31/2012   Nausea 07/06/2012   Mature cataract 06/23/2012   Hyperlipidemia, mixed 02/24/2012   CAD- prior stents 2000 by Dr Elonda Husky [facility] 02/24/2012   H/O tobacco use, presenting hazards to health 02/24/2012   CML (chronic myelocytic leukemia) ([place]) 12/16/2011    Past Surgical History:  Procedure Laterality Date   CATARACT EXTRACTION     CHOLECYSTECTOMY  2003   CORONARY ANGIOPLASTY WITH  STENT PLACEMENT  2000   EP IMPLANTABLE DEVICE N/A 04/15/2016   Procedure: Pacemaker Implant;  Surgeon: Evans Lance, MD; Medtronic (serial number [id number]) pacemaker; Laterality: Left       Family History  Problem Relation Age of Onset   Arthritis Mother    Hypertension Mother    Heart disease Mother        Rheumatic fever   Rheumatic fever Mother    Other Mother        brain tumor   Cancer Mother        leukemia   Hypertension Father    Alcohol abuse Father    Diabetes Son    Colon cancer Neg Hx    Breast cancer Neg Hx    Prostate cancer Neg Hx     Social History   Tobacco Use   Smoking status: Former    Packs/day: 0.25    Years: 40.00    Pack years: 10.00    Types: Cigarettes    Start date: 10/30/1967    Quit date: 04/14/2016    Years since quitting: 5.2   Smokeless tobacco: Never  Vaping Use   Vaping Use: Never used  Substance Use Topics   Alcohol use: No    Alcohol/week: 0.0 standard drinks    Comment: Rare   Drug use: No    Home Medications Prior to Admission medications   Medication Sig Start Date End Date Taking? Authorizing Provider  amoxicillin-clavulanate (AUGMENTIN) 875-125 MG tablet Take 1 tablet by mouth 2 (two) times daily. One po bid x 7 days 07/31/21  Yes Briton Sellman, Jeannie Done, MD  magnesium oxide (MAG-OX) 400 MG tablet Take 1 tablet (400 mg total) by mouth daily. 07/31/21  Yes Helvi Royals, Jeannie Done, MD  potassium chloride SA (KLOR-CON) 20 MEQ tablet Take 1 tablet (20 mEq total) by mouth 3 (three) times daily. 07/31/21  Yes Charlesetta Shanks, MD  acetaminophen (TYLENOL) 325 MG tablet Take 650 mg by mouth every 6 (six) hours as needed. For headache.    [provider]  amiodarone (PACERONE) 200 MG tablet TAKE 1/2 TABLET BY MOUTH EVERY DAY 07/07/21   Camnitz, Ocie Doyne, MD  amLODipine (NORVASC) 5 MG tablet Take 1 tablet (5 mg total) by mouth daily. 05/01/21   Lelon Perla, MD  atorvastatin (LIPITOR) 40 MG tablet Take 1 tablet (40 mg total) by mouth  daily. 01/09/20   Lelon Perla, MD  dicyclomine (BENTYL) 20 MG tablet Take 1 tablet (20 mg total) by mouth 2 (two) times daily. 08/01/17   Long, Wonda Olds, MD  ferrous sulfate 325 (65 FE) MG EC tablet Take 1 tablet (325 mg total) by mouth 2 (two) times daily. 09/02/15   Irene Shipper, MD  fluticasone [facility]) 50 MCG/ACT nasal spray Place 2 sprays into both nostrils daily. 07/07/21   Mosie Lukes, MD  imatinib ([name]) 400 MG tablet TAKE  1 TABLET ('400MG'$  TOTAL) BY MOUTH DAILY. TAKE WITH A MEAL AND LARGE GLASS OF WATER. 03/11/21   Volanda Napoleon, MD  levothyroxine (SYNTHROID) 50 MCG tablet Take 1 tablet (50 mcg total) by mouth daily before breakfast. 03/24/21   Mosie Lukes, MD  losartan (COZAAR) 100 MG tablet Take 1 tablet (100 mg total) by mouth daily. 07/27/21   Mosie Lukes, MD  meclizine (ANTIVERT) 25 MG tablet TAKE 1 TABLET THREE TIMES DAILY 09/14/19   Mosie Lukes, MD  metoprolol succinate (TOPROL XL) 100 MG 24 hr tablet Take 1 tablet (100 mg total) by mouth daily. 08/13/20   Lelon Perla, MD  nitroGLYCERIN (NITROSTAT) 0.4 MG SL tablet Place 1 tablet (0.4 mg total) under the tongue every 5 (five) minutes as needed for chest pain. 03/06/20   Debbrah Alar, NP  ofloxacin (FLOXIN OTIC) 0.3 % OTIC solution Place 10 drops into both ears daily. For 7 days 09/15/17   Carollee Herter, Kendrick Fries R, DO  omeprazole (PRILOSEC) 40 MG capsule TAKE 1 CAPSULE EVERY DAY (NEED OFFICE VISIT FOR GREATER QUANTITIES/REFILLS) 07/07/21   Mosie Lukes, MD  ondansetron (ZOFRAN) 4 MG tablet Take 1 tablet (4 mg total) by mouth 2 (two) times daily as needed for nausea or vomiting. 07/02/21   Volanda Napoleon, MD  Probiotic Product (PROBIOTIC FORMULA PO) Take 1 capsule by mouth daily.     [provider]  rivaroxaban (XARELTO) 20 MG TABS tablet TAKE 1 TABLET EVERY DAY WITH SUPPER 05/01/21   Camnitz, Ocie Doyne, MD  Saccharomyces boulardii (PROBIOTIC) 250 MG CAPS Take 250 mg by mouth daily. 01/20/21   Mosie Lukes, MD    Allergies    Patient has no known allergies.  Review of Systems   Review of Systems 10 Systems reviewed and negative except as per HPI Physical Exam Updated Vital Signs BP (!) 137/94   Pulse 90   Temp 97.8 F (36.6 C) (Oral)   Resp 13   Ht '5\' 8"'$  (1.727 m)   Wt 83 kg   SpO2 94%   BMI 27.82 kg/m   Physical Exam Constitutional:      Comments: Alert.  No acute distress.  No respiratory distress.  Skin color is very pale.  HENT:     Head: Normocephalic and atraumatic.     Mouth/Throat:     Mouth: Mucous membranes are moist.     Pharynx: Oropharynx is clear.  Eyes:     Extraocular Movements: Extraocular movements intact.  Cardiovascular:     Rate and Rhythm: Normal rate and regular rhythm.  Pulmonary:     Effort: Pulmonary effort is normal.     Breath sounds: Normal breath sounds.  Abdominal:     General: There is no distension.     Palpations: Abdomen is soft.     Tenderness: There is no abdominal tenderness. There is no guarding.  Musculoskeletal:     Comments: Trace edema at the ankles and lower legs.  Lower legs and feet symmetric.  No active wounds or appearance of cellulitis.  Skin:    General: Skin is warm and dry.     Coloration: Skin is pale.  Neurological:     General: No focal deficit present.     Mental Status: He is oriented to person, place, and time.     Motor: No weakness.     Coordination: Coordination normal.  Psychiatric:        Mood and Affect: Mood normal.  ED Results / Procedures / Treatments   Labs (all labs ordered are listed, but only abnormal results are displayed) Labs Reviewed  COMPREHENSIVE METABOLIC PANEL - Abnormal; Notable for the following components:      Result Value   Potassium 2.8 (*)    Calcium 8.4 (*)    All other components within normal limits  LIPASE, BLOOD - Abnormal; Notable for the following components:   Lipase 165 (*)    All other components within normal limits  BRAIN NATRIURETIC PEPTIDE -  Abnormal; Notable for the following components:   B Natriuretic Peptide 318.5 (*)    All other components within normal limits  PROTIME-INR - Abnormal; Notable for the following components:   Prothrombin Time 16.3 (*)    INR 1.3 (*)    All other components within normal limits  URINALYSIS, ROUTINE W REFLEX MICROSCOPIC - Abnormal; Notable for the following components:   Specific Gravity, Urine <1.005 (*)    All other components within normal limits  MAGNESIUM - Abnormal; Notable for the following components:   Magnesium 1.2 (*)    All other components within normal limits  PHOSPHORUS - Abnormal; Notable for the following components:   Phosphorus 2.4 (*)    All other components within normal limits  I-STAT VENOUS BLOOD GAS, ED - Abnormal; Notable for the following components:   pH, Ven 7.449 (*)    pCO2, Ven 35.7 (*)    Potassium 3.0 (*)    All other components within normal limits  RESP PANEL BY RT-PCR (FLU A&B, COVID) ARPGX2  LACTIC ACID, PLASMA  LACTIC ACID, PLASMA  CBC WITH DIFFERENTIAL/PLATELET  TSH  TROPONIN I (HIGH SENSITIVITY)  TROPONIN I (HIGH SENSITIVITY)    EKG EKG Interpretation  Date/Time:  Friday July 31 2021 16:42:39 EDT Ventricular Rate:  61 PR Interval:  234 QRS Duration: 146 QT Interval:  508 QTC Calculation: 511 R Axis:   -83 Text Interpretation: Atrial-paced rhythm with prolonged AV conduction Right bundle branch block Left anterior fascicular block Bifascicular block Minimal voltage criteria for LVH, may be normal variant ( R in aVL ) Septal infarct , age undetermined Cannot rule out Inferior infarct , age undetermined Abnormal ECG Confirmed by Charlesetta Shanks [phone]) on 07/31/2021 4:50:00 PM  Radiology CT Head Wo Contrast  Result Date: 07/31/2021 CLINICAL DATA:  One day of vertigo history of CVA, CML and hypertension EXAM: CT HEAD WITHOUT CONTRAST TECHNIQUE: Contiguous axial images were obtained from the base of the skull through the vertex without  intravenous contrast. COMPARISON:  April 14, 2016 FINDINGS: Brain: Mild for age global parenchymal volume loss. Scattered subcortical and periventricular white matter hypodensities, not significantly changed from prior and likely reflecting chronic ischemic white matter disease. No evidence of acute large vascular territory infarction, hemorrhage, hydrocephalus, extra-axial collection or mass lesion/mass effect. Vascular: No hyperdense vessel. Atherosclerotic calcifications of the internal carotid and vertebral arteries at the skull base. Skull: Mild hyperostosis frontalis. Negative for fracture or focal lesion. Sinuses/Orbits: Mucosal thickening of the right maxillary sinus with thickening and sclerosis of the walls of the sinus. Mild mucosal thickening of the right frontal sinus and bilateral ethmoid air cells. Mastoid air cells are predominantly clear. Orbits are unremarkable. Other: None IMPRESSION: 1. No acute intracranial findings. 2. Mild for age global parenchymal volume loss and chronic ischemic white matter disease. 3. Chronic right maxillary sinusitis. Electronically Signed   By: Dahlia Bailiff MD   On: 07/31/2021 18:25   DG Chest Port 1 View  Result Date:  07/31/2021 CLINICAL DATA:  Shortness of breath dizzy EXAM: PORTABLE CHEST 1 VIEW COMPARISON:  12/12/2019 FINDINGS: Left-sided pacing device as before. Mild diffuse reticular disease likely chronic interstitial lung disease. No consolidation or effusion. Normal cardiomediastinal silhouette. No pneumothorax. IMPRESSION: No active disease. Mild diffuse reticular opacity consistent with chronic lung disease Electronically Signed   By: Donavan Foil M.D.   On: 07/31/2021 18:23   CT Angio Chest/Abd/Pel for Dissection W and/or W/WO  Result Date: 07/31/2021 CLINICAL DATA:  Abdominal pain, aortic dissection suspected, known abdominal aortic aneurysm EXAM: CT ANGIOGRAPHY CHEST, ABDOMEN AND PELVIS TECHNIQUE: Non-contrast CT of the chest was initially  obtained. Multidetector CT imaging through the chest, abdomen and pelvis was performed using the standard protocol during bolus administration of intravenous contrast. Multiplanar reconstructed images and MIPs were obtained and reviewed to evaluate the vascular anatomy. CONTRAST:  141m OMNIPAQUE IOHEXOL 350 MG/ML SOLN COMPARISON:  CT abdomen pelvis, 05/25/2019 FINDINGS: CTA CHEST FINDINGS Cardiovascular: Preferential opacification of the thoracic aorta. Normal contour and caliber of the thoracic aorta. No evidence of aneurysm, dissection, or other acute aortic pathology. Aortic atherosclerosis. Normal heart size. Three-vessel coronary artery calcification. No pericardial effusion. Left chest multi lead pacer. Mediastinum/Nodes: Numerous prominent mediastinal and hilar lymph nodes. Thyroid gland, trachea, and esophagus demonstrate no significant findings. Lungs/Pleura: Moderate centrilobular emphysema. Bibasilar predominant irregular peripheral interstitial opacity with predominantly dependent small thin walled cysts, without clear evidence of subpleural bronchiolectasis or honeycombing. Irregular nodule of the left upper lobe (series 7, image 23). No pleural effusion or pneumothorax. Musculoskeletal: No chest wall abnormality. No acute or significant osseous findings. Review of the MIP images confirms the above findings. CTA ABDOMEN AND PELVIS FINDINGS VASCULAR Redemonstrated aneurysm of the infrarenal abdominal aorta, measuring up to 4.9 x 4.5 cm, enlarged compared to prior examination, at which time it measured 4.5 x 4.0 cm when measured similarly. There is a large burden of eccentric mural thrombus. No evidence of rupture or other acute complication. Incidental note of duplicated right renal arteries, with a small accessory inferior pole right renal artery. Solitary left renal artery. Otherwise standard branching pattern of the abdominal aorta. Atherosclerosis at the aortic branch vessel origins without  significant stenosis. Review of the MIP images confirms the above findings. NON-VASCULAR Hepatobiliary: No focal liver abnormality is seen. Status post cholecystectomy. No biliary dilatation. Pancreas: Unremarkable. No pancreatic ductal dilatation or surrounding inflammatory changes. Spleen: Normal in size without significant abnormality. Adrenals/Urinary Tract: Stable, definitively benign small fat containing left adrenal adenoma. Kidneys are normal, without renal calculi, solid lesion, or hydronephrosis. Bladder is unremarkable. Stomach/Bowel: Stomach is within normal limits. Appendix appears normal. No evidence of bowel wall thickening, distention, or inflammatory changes. Lymphatic: No enlarged abdominal or pelvic lymph nodes. Reproductive: No mass or other significant abnormality. Other: No abdominal wall hernia or abnormality. No abdominopelvic ascites. Musculoskeletal: No acute or significant osseous findings. Review of the MIP images confirms the above findings. IMPRESSION: 1. Redemonstrated aneurysm of the infrarenal abdominal aorta, measuring up to 4.9 x 4.5 cm, enlarged compared to prior examination dated 05/25/2019, at which time it measured 4.5 x 4.0 cm when measured similarly. There is a large burden of eccentric mural thrombus. No evidence of rupture or other acute complication. Recommend referral to a vascular specialist. This recommendation follows ACR consensus guidelines: White Paper of the ACR Incidental Findings Committee II on Vascular Findings. J Am Coll Radiol 2013; 10:789-794. 2. Normal contour and caliber of the thoracic aorta. No evidence of aneurysm, dissection, or other acute aortic  pathology. 3. Bibasilar predominant irregular peripheral interstitial opacity with predominantly dependent small thin walled cysts, without clear evidence of subpleural bronchiolectasis or honeycombing. Findings suggest pulmonary fibrosis, or alternately nonspecific sequelae of prior infection or  inflammation, in an "alternative diagnosis" if characterized by ATS pulmonary fibrosis criteria, and could be further characterized by follow-up ILD protocol CT of the chest on a nonemergent basis if indicated by referable clinical signs and symptoms. 4. Emphysema. 5. Irregular nodule of the left upper lobe measures 7 mm, nonspecific. Non-contrast chest CT at 6-12 months is recommended. If the nodule is stable at time of repeat CT, then future CT at 18-24 months (from today's scan) is considered optional for low-risk patients, but is recommended for high-risk patients. This recommendation follows the consensus statement: Guidelines for Management of Incidental Pulmonary Nodules Detected on CT Images: From the Fleischner Society 2017; Radiology 2017; 284:228-243. 6. Numerous prominent mediastinal and hilar lymph nodes, nonspecific and most likely reactive. 7. Coronary artery disease. Aortic Atherosclerosis (ICD10-I70.0) and Emphysema (ICD10-J43.9). Electronically Signed   By: Eddie Candle M.D.   On: 07/31/2021 20:45    Procedures Procedures   Medications Ordered in ED Medications  potassium chloride SA (KLOR-CON) CR tablet 40 mEq (40 mEq Oral Given 07/31/21 1946)  potassium chloride 10 mEq in 100 mL IVPB (0 mEq Intravenous Stopped 07/31/21 2108)  magnesium sulfate IVPB 2 g 50 mL (0 g Intravenous Stopped 07/31/21 2059)  iohexol (OMNIPAQUE) 350 MG/ML injection 100 mL (100 mLs Intravenous Contrast Given 07/31/21 2013)    ED Course  I have reviewed the triage vital signs and the nursing notes.  Pertinent labs & imaging results that were available during my care of the patient were reviewed by me and considered in my medical decision making (see chart for details).    MDM Rules/Calculators/A&P                           Patient presents as outlined.  He has some generalized symptoms of dizziness without any focal neurologic deficits.  He describes both lightheadedness as well as somewhat of a motion induced  dizziness.  Headaches have come and gone.  He describes it was probably being frontal and on the top of his head.  Clinically, the patient is well in appearance.  CT head obtained without any acute intracranial findings.  There is observation of significant sinus inflammation which, although noted as chronic, is fairly consistent as a etiology for the patient's symptoms.  His neurologic exam is intact.  Patient also has some electrolyte patient with low potassium and magnesium.  These are repleted in the emergency department.  Plan will be to add daily potassium and magnesium with close follow-up with patient's PCP for for surveillance of electrolytes and follow-up for response to treatment for sinusitis.  Patient is clinically well at time of discharge.  He is discharged with a family member who is aware and understanding of the plan.  Careful return precautions reviewed. Final Clinical Impression(s) / ED Diagnoses Final diagnoses:  Dizziness  Chronic maxillary sinusitis  Hypokalemia  Hypomagnesemia    Rx / DC Orders ED Discharge Orders          Ordered    potassium chloride SA (KLOR-CON) 20 MEQ tablet  3 times daily        07/31/21 2107    magnesium oxide (MAG-OX) 400 MG tablet  Daily        07/31/21 2107    amoxicillin-clavulanate (  AUGMENTIN) 875-125 MG tablet  2 times daily        07/31/21 2107             Charlesetta Shanks, MD 07/31/21 2115

## 2021-07-31 NOTE — Discharge Instructions (Addendum)
1.With your headaches that keeps recurring and dizziness, the plan today will be to treat the sinus inflammation seen on your CT scan.  You are being prescribed an antibiotic called Augmentin.  Take this medication twice daily as prescribed.  You may continue to take acetaminophen (Tylenol) as needed for headache or pain.  2.  Your potassium level and magnesium levels were low today.  You have been given a dose of potassium and magnesium in the emergency department.  Fill your prescriptions for potassium and magnesium and begin taking per instructions.  These must be monitored very closely by your doctor to determine if you need these medications as permanent medications are only temporarily.  Initially potassium must be very closely monitored because it can be dangerous at high levels.  Currently your level is low.  3.  Return to the emergency department if you are getting any worsening of your symptoms, new or other concerning symptoms.

## 2021-07-31 NOTE — ED Triage Notes (Signed)
Dizziness since yesterday. States at times he feels like he might pass out. EKG at triage.

## 2021-08-01 LAB — TSH: TSH: 7.836 u[IU]/mL — ABNORMAL HIGH (ref 0.350–4.500)

## 2021-08-18 ENCOUNTER — Other Ambulatory Visit: Payer: Self-pay

## 2021-08-18 ENCOUNTER — Encounter: Payer: Self-pay | Admitting: Family Medicine

## 2021-08-18 ENCOUNTER — Ambulatory Visit (INDEPENDENT_AMBULATORY_CARE_PROVIDER_SITE_OTHER): Payer: Medicare Other | Admitting: Family Medicine

## 2021-08-18 VITALS — BP 136/74 | HR 106 | Temp 98.1°F | Resp 16 | Wt 180.0 lb

## 2021-08-18 DIAGNOSIS — N289 Disorder of kidney and ureter, unspecified: Secondary | ICD-10-CM

## 2021-08-18 DIAGNOSIS — R790 Abnormal level of blood mineral: Secondary | ICD-10-CM

## 2021-08-18 DIAGNOSIS — R109 Unspecified abdominal pain: Secondary | ICD-10-CM

## 2021-08-18 DIAGNOSIS — E538 Deficiency of other specified B group vitamins: Secondary | ICD-10-CM

## 2021-08-18 DIAGNOSIS — R519 Headache, unspecified: Secondary | ICD-10-CM

## 2021-08-18 DIAGNOSIS — Z9189 Other specified personal risk factors, not elsewhere classified: Secondary | ICD-10-CM

## 2021-08-18 DIAGNOSIS — R748 Abnormal levels of other serum enzymes: Secondary | ICD-10-CM

## 2021-08-18 DIAGNOSIS — I10 Essential (primary) hypertension: Secondary | ICD-10-CM

## 2021-08-18 DIAGNOSIS — R911 Solitary pulmonary nodule: Secondary | ICD-10-CM | POA: Diagnosis not present

## 2021-08-18 DIAGNOSIS — R42 Dizziness and giddiness: Secondary | ICD-10-CM

## 2021-08-18 DIAGNOSIS — E876 Hypokalemia: Secondary | ICD-10-CM

## 2021-08-18 DIAGNOSIS — J449 Chronic obstructive pulmonary disease, unspecified: Secondary | ICD-10-CM

## 2021-08-18 LAB — COMPREHENSIVE METABOLIC PANEL
ALT: 13 U/L (ref 0–53)
AST: 17 U/L (ref 0–37)
Albumin: 4.1 g/dL (ref 3.5–5.2)
Alkaline Phosphatase: 87 U/L (ref 39–117)
BUN: 9 mg/dL (ref 6–23)
CO2: 25 mEq/L (ref 19–32)
Calcium: 9.5 mg/dL (ref 8.4–10.5)
Chloride: 108 mEq/L (ref 96–112)
Creatinine, Ser: 1.12 mg/dL (ref 0.40–1.50)
GFR: 65.21 mL/min (ref >60.00)
Glucose, Bld: 87 mg/dL (ref 70–99)
Potassium: 4.5 mEq/L (ref 3.5–5.1)
Sodium: 142 mEq/L (ref 135–145)
Total Bilirubin: 0.5 mg/dL (ref 0.2–1.2)
Total Protein: 7.3 g/dL (ref 6.0–8.3)

## 2021-08-18 LAB — CBC WITH DIFFERENTIAL/PLATELET
Basophils Absolute: 0.1 10*3/uL (ref 0.0–0.1)
Basophils Relative: 1.3 % (ref 0.0–3.0)
Eosinophils Absolute: 0.4 10*3/uL (ref 0.0–0.7)
Eosinophils Relative: 5 % (ref 0.0–5.0)
HCT: 41.3 % (ref 39.0–52.0)
Hemoglobin: 13.6 g/dL (ref 13.0–17.0)
Lymphocytes Relative: 17.4 % (ref 12.0–46.0)
Lymphs Abs: 1.4 10*3/uL (ref 0.7–4.0)
MCHC: 32.9 g/dL (ref 30.0–36.0)
MCV: 94.8 fl (ref 78.0–100.0)
Monocytes Absolute: 0.7 10*3/uL (ref 0.1–1.0)
Monocytes Relative: 8.4 % (ref 3.0–12.0)
Neutro Abs: 5.6 10*3/uL (ref 1.4–7.7)
Neutrophils Relative %: 67.9 % (ref 43.0–77.0)
Platelets: 192 10*3/uL (ref 150.0–400.0)
RBC: 4.35 Mil/uL (ref 4.22–5.81)
RDW: 15 % (ref 11.5–15.5)
WBC: 8.3 10*3/uL (ref 4.0–10.5)

## 2021-08-18 LAB — AMYLASE: Amylase: 80 U/L (ref 27–131)

## 2021-08-18 LAB — MAGNESIUM: Magnesium: 1.9 mg/dL (ref 1.5–2.5)

## 2021-08-18 LAB — LIPASE: Lipase: 168 U/L — ABNORMAL HIGH (ref 11.0–59.0)

## 2021-08-18 MED ORDER — NITROGLYCERIN 0.4 MG SL SUBL: 0.4000 mg | SUBLINGUAL_TABLET | SUBLINGUAL | 1 refills | Status: AC | PRN

## 2021-08-18 NOTE — Assessment & Plan Note (Signed)
Recheck levels with amylase today

## 2021-08-18 NOTE — Assessment & Plan Note (Signed)
Found in Er recently is taking daily magnesium tabs now recheck level

## 2021-08-18 NOTE — Patient Instructions (Addendum)
Maintain increased hydration, 64 ounces of clear fluids daily. Minimize alcohol and caffeine. Eat small frequent meals with lean proteins and complex carbs. Avoid high and low blood sugars. Get adequate sleep, 7-8 hours a night. Needs exercise daily preferably in the morning.   Pulmonary Nodule  A pulmonary nodule is a small, round growth of tissue in the lung. A nodule maybe cancer, but most nodules are not cancer. What are the causes? Infection from a germ (bacteria, fungus, or virus), such as tuberculosis. Tissue that is cancer, such as: Cancer in the lung. Cancer that has spread to the lung from another part of the body. A growth of tissue (mass) that is not cancer. Swelling and irritation from conditions such as rheumatoid arthritis. Having blood vessels that are not normal in the lungs. What are the signs or symptoms? Many times, there are no symptoms. If you get symptoms, they normally haveanother cause, such as infection. How is this treated? Treatment depends on: If your nodule is cancer or if it is not cancer. What your risk of getting cancer is. Some nodules are not cancer. If this is the case for you, you may not needtreatment. Your doctor may do tests to watch the nodule for changes. If the nodule is cancer: You will need tests, such as CT and PET scans. You may need treatment. This may include: Surgery. Treatment with high-energy X-rays (radiation therapy). Medicines. Some nodules need to be taken out. You may have a procedure to have the nodule taken out. During the procedure, your doctor will make a cut (incision) into your chest and take out the part of your lung that has the nodule. Follow these instructions at home: Take over-the-counter and prescription medicines only as told by your doctor. Do not smoke or use any products that contain nicotine or tobacco. If you need help quitting, ask your doctor. Keep all follow-up visits. Contact a doctor if: You have pain in  your chest, back, or shoulder. You are short of breath or have trouble breathing when you are active. You get a cough. Your voice starts to sound raspy, breathy, or strained (hoarse), and you do not know why. You feel sick or more tired than normal. You do not feel like eating. You lose weight without trying. You get chills, or you start to sweat a lot during sleep. You need two or more pillows to sleep on at night. You have: A fever and your symptoms get worse all of a sudden. A fever or symptoms for more than 2-3 days. Get help right away if: You cannot catch your breath. You have sudden chest pain. You start making high-pitched whistling sounds when you breathe, most often when you breathe out (you wheeze). You cannot stop coughing. You cough up blood or bloody mucus from your lungs (sputum). You get dizzy or feel like you may faint. These symptoms may represent a serious problem that is an emergency. Do not wait to see if the symptoms will go away. Get medical help right away. Call your local emergency services (911 in the [place] Do not drive yourself to the hospital. Summary A pulmonary nodule is a small, round growth of tissue in the lung. Most of these nodules are not cancer. Common causes of nodules in the lung include infection, swelling and irritation, and growths that are not cancer. Treatment depends on whether the nodule is cancer or is not cancer. Treatment also depends on your risk of getting cancer. If the nodule is cancer,  you will need certain tests and treatments as told by your doctor. This information is not intended to replace advice given to you by your health care provider. Make sure you discuss any questions you have with your healthcare provider. Document Revised: 07/02/2020 Document Reviewed: 07/02/2020 Elsevier Patient Education  [employer].

## 2021-08-18 NOTE — Assessment & Plan Note (Signed)
Was seen in ER with on 8/5 with dizziness, nausea, weakness. He was found to have low potassium, he was given some in the ER and his potassium increased from 2.8 to 3.0 and he has been taking the pills the ER gave him until a couple days ago and just had his first sense of dizziness just today for the first time. Recheck levels today

## 2021-08-18 NOTE — Progress Notes (Signed)
Patient ID: Jason [name], male    DOB: Nov 11, 1946  Age: 75 y.o. MRN: [id number]    Subjective:  Subjective  HPI JHETT NEIRA presents for office visit today for follow up on recent hospitalization and htn. He reports that he felt nauseous and sick with no pain 3 days before going to the ER. He states that he usually drinks coffee with breakfast in the morning, but recently he has been losing appetite to eat. However, on occasion he does feel an appetite to eat. He reports that dizziness hadstarted this morning. He states that he is not hydrating as well as he should and takes 2 tylenols a day for his HA's. Denies CP/palp/congestion/fevers or GU c/o. Taking meds as prescribed. He states that he started smoking of 1/2 a pack a day in 1967 and has stopped in 2017. He reports that he has memory trouble and sometimes forgets to take his levothyroxine 50 mg before breakfast.  He reports experiencing SOB when moving and when laying down.   Review of Systems  Constitutional:  Positive for appetite change (decreased). Negative for chills, fatigue and fever.  HENT:  Negative for congestion, rhinorrhea, sinus pressure, sinus pain and sore throat.   Eyes:  Negative for pain.  Respiratory:  Positive for shortness of breath. Negative for cough.   Cardiovascular:  Negative for chest pain, palpitations and leg swelling.  Gastrointestinal:  Positive for nausea. Negative for abdominal pain, blood in stool, diarrhea and vomiting.  Genitourinary:  Negative for flank pain, frequency and penile pain.  Musculoskeletal:  Negative for back pain.  Neurological:  Positive for dizziness, light-headedness and headaches.   History Past Medical History:  Diagnosis Date   Anemia 05/08/2015   CML (chronic myelocytic leukemia) ([place]) 12/16/2011   Coronary heart disease    CVA (cerebrovascular accident) ([place])    GERD (gastroesophageal reflux disease)    Grief reaction 03/18/2014   H/O tobacco use, presenting hazards to  health 02/24/2012   No cigarettes since pacemaker placement. Encouraged ongoing efforts    History of chicken pox    History of kidney stones    Hyperlipidemia    Hypertension    IBS (irritable bowel syndrome) 09/20/2013   Medicare annual wellness visit, subsequent 03/23/2014   PAF (paroxysmal atrial fibrillation) ([facility])    Symptomatic bradycardia    a. symptomatic bradycardia due to sinus node dysfunction s/p RA and RV [employer] pacemaker 04/15/16    He has a past surgical history that includes Cholecystectomy (2003); Coronary angioplasty with stent (2000); Cataract extraction; and Cardiac catheterization (N/A, 04/15/2016).   His family history includes Alcohol abuse in his father; Arthritis in his mother; Cancer in his mother; Diabetes in his son; Heart disease in his mother; Hypertension in his father and mother; Other in his mother; Rheumatic fever in his mother.He reports that he quit smoking about 5 years ago. His smoking use included cigarettes. He started smoking about 53 years ago. He has a 10.00 pack-year smoking history. He has never used smokeless tobacco. He reports that he does not drink alcohol and does not use drugs.  Current Outpatient Medications on File Prior to Visit  Medication Sig Dispense Refill   acetaminophen (TYLENOL) 325 MG tablet Take 650 mg by mouth every 6 (six) hours as needed. For headache.     amiodarone (PACERONE) 200 MG tablet TAKE 1/2 TABLET BY MOUTH EVERY DAY 45 tablet 1   amLODipine (NORVASC) 5 MG tablet Take 1 tablet (5 mg total) by mouth daily.  90 tablet 1   atorvastatin (LIPITOR) 40 MG tablet Take 1 tablet (40 mg total) by mouth daily. 90 tablet 2   dicyclomine (BENTYL) 20 MG tablet Take 1 tablet (20 mg total) by mouth 2 (two) times daily. 20 tablet 0   ferrous sulfate 325 (65 FE) MG EC tablet Take 1 tablet (325 mg total) by mouth 2 (two) times daily. 60 tablet 11   fluticasone (FLONASE) 50 MCG/ACT nasal spray Place 2 sprays into both nostrils daily. 48 g 3    imatinib ([name]) 400 MG tablet TAKE 1 TABLET ('400MG'$  TOTAL) BY MOUTH DAILY. TAKE WITH A MEAL AND LARGE GLASS OF WATER. 30 tablet 6   levothyroxine (SYNTHROID) 50 MCG tablet Take 1 tablet (50 mcg total) by mouth daily before breakfast. 30 tablet 3   losartan (COZAAR) 100 MG tablet Take 1 tablet (100 mg total) by mouth daily. 90 tablet 1   magnesium oxide (MAG-OX) 400 MG tablet Take 1 tablet (400 mg total) by mouth daily. 30 tablet 0   meclizine (ANTIVERT) 25 MG tablet TAKE 1 TABLET THREE TIMES DAILY 90 tablet 0   metoprolol succinate (TOPROL XL) 100 MG 24 hr tablet Take 1 tablet (100 mg total) by mouth daily. 90 tablet 3   ofloxacin (FLOXIN OTIC) 0.3 % OTIC solution Place 10 drops into both ears daily. For 7 days 10 mL 0   omeprazole (PRILOSEC) 40 MG capsule TAKE 1 CAPSULE EVERY DAY (NEED OFFICE VISIT FOR GREATER QUANTITIES/REFILLS) 90 capsule 0   ondansetron (ZOFRAN) 4 MG tablet Take 1 tablet (4 mg total) by mouth 2 (two) times daily as needed for nausea or vomiting. 180 tablet 1   potassium chloride SA (KLOR-CON) 20 MEQ tablet Take 1 tablet (20 mEq total) by mouth 3 (three) times daily. 15 tablet 0   Probiotic Product (PROBIOTIC FORMULA PO) Take 1 capsule by mouth daily.      rivaroxaban (XARELTO) 20 MG TABS tablet TAKE 1 TABLET EVERY DAY WITH SUPPER 90 tablet 1   Saccharomyces boulardii (PROBIOTIC) 250 MG CAPS Take 250 mg by mouth daily. 90 capsule 1   Current Facility-Administered Medications on File Prior to Visit  Medication Dose Route Frequency Provider Last Rate Last Admin   cyanocobalamin ((VITAMIN B-12)) injection 1,000 mcg  1,000 mcg Intramuscular Q30 days Mosie Lukes, MD   1,000 mcg at 07/15/20 [id number]     Objective:  Objective  Physical Exam Constitutional:      General: He is not in acute distress.    Appearance: Normal appearance. He is not ill-appearing or toxic-appearing.  HENT:     Head: Normocephalic and atraumatic.     Right Ear: Tympanic membrane, ear canal and  external ear normal.     Left Ear: Tympanic membrane, ear canal and external ear normal.     Nose: No congestion or rhinorrhea.  Eyes:     Extraocular Movements: Extraocular movements intact.     Pupils: Pupils are equal, round, and reactive to light.  Cardiovascular:     Rate and Rhythm: Normal rate and regular rhythm.     Pulses: Normal pulses.     Heart sounds: Normal heart sounds. No murmur heard. Pulmonary:     Effort: Pulmonary effort is normal. No respiratory distress.     Breath sounds: Rhonchi (right base) present. No wheezing or rales.     Comments: -prolonged expiration Abdominal:     General: Bowel sounds are normal.     Palpations: Abdomen is soft. There is no  mass.     Tenderness: There is no abdominal tenderness. There is no guarding.     Hernia: No hernia is present.  Musculoskeletal:        General: Normal range of motion.     Cervical back: Normal range of motion and neck supple.  Skin:    General: Skin is warm and dry.  Neurological:     Mental Status: He is alert and oriented to person, place, and time.  Psychiatric:        Behavior: Behavior normal.   BP 136/74   Pulse (!) 106   Temp 98.1 F (36.7 C)   Resp 16   Wt 180 lb (81.6 kg)   SpO2 93%   BMI 27.37 kg/m  Wt Readings from Last 3 Encounters:  08/18/21 180 lb (81.6 kg)  07/31/21 182 lb 15.7 oz (83 kg)  07/02/21 183 lb (83 kg)     Lab Results  Component Value Date   WBC 8.3 08/18/2021   HGB 13.6 08/18/2021   HCT 41.3 08/18/2021   PLT 192.0 08/18/2021   GLUCOSE 87 08/18/2021   CHOL 130 03/24/2021   TRIG 94.0 03/24/2021   HDL 37.20 (L) 03/24/2021   LDLCALC 74 03/24/2021   ALT 13 08/18/2021   AST 17 08/18/2021   NA 142 08/18/2021   K 4.5 08/18/2021   CL 108 08/18/2021   CREATININE 1.12 08/18/2021   BUN 9 08/18/2021   CO2 25 08/18/2021   TSH 7.836 (H) 07/31/2021   PSA 0.98 09/22/2018   INR 1.3 (H) 07/31/2021    CT Head Wo Contrast  Result Date: 07/31/2021 CLINICAL DATA:  One day  of vertigo history of CVA, CML and hypertension EXAM: CT HEAD WITHOUT CONTRAST TECHNIQUE: Contiguous axial images were obtained from the base of the skull through the vertex without intravenous contrast. COMPARISON:  April 14, 2016 FINDINGS: Brain: Mild for age global parenchymal volume loss. Scattered subcortical and periventricular white matter hypodensities, not significantly changed from prior and likely reflecting chronic ischemic white matter disease. No evidence of acute large vascular territory infarction, hemorrhage, hydrocephalus, extra-axial collection or mass lesion/mass effect. Vascular: No hyperdense vessel. Atherosclerotic calcifications of the internal carotid and vertebral arteries at the skull base. Skull: Mild hyperostosis frontalis. Negative for fracture or focal lesion. Sinuses/Orbits: Mucosal thickening of the right maxillary sinus with thickening and sclerosis of the walls of the sinus. Mild mucosal thickening of the right frontal sinus and bilateral ethmoid air cells. Mastoid air cells are predominantly clear. Orbits are unremarkable. Other: None IMPRESSION: 1. No acute intracranial findings. 2. Mild for age global parenchymal volume loss and chronic ischemic white matter disease. 3. Chronic right maxillary sinusitis. Electronically Signed   By: Dahlia Bailiff MD   On: 07/31/2021 18:25   DG Chest Port 1 View  Result Date: 07/31/2021 CLINICAL DATA:  Shortness of breath dizzy EXAM: PORTABLE CHEST 1 VIEW COMPARISON:  12/12/2019 FINDINGS: Left-sided pacing device as before. Mild diffuse reticular disease likely chronic interstitial lung disease. No consolidation or effusion. Normal cardiomediastinal silhouette. No pneumothorax. IMPRESSION: No active disease. Mild diffuse reticular opacity consistent with chronic lung disease Electronically Signed   By: Donavan Foil M.D.   On: 07/31/2021 18:23   CT Angio Chest/Abd/Pel for Dissection W and/or W/WO  Result Date: 07/31/2021 CLINICAL DATA:   Abdominal pain, aortic dissection suspected, known abdominal aortic aneurysm EXAM: CT ANGIOGRAPHY CHEST, ABDOMEN AND PELVIS TECHNIQUE: Non-contrast CT of the chest was initially obtained. Multidetector CT imaging through the chest,  abdomen and pelvis was performed using the standard protocol during bolus administration of intravenous contrast. Multiplanar reconstructed images and MIPs were obtained and reviewed to evaluate the vascular anatomy. CONTRAST:  172m OMNIPAQUE IOHEXOL 350 MG/ML SOLN COMPARISON:  CT abdomen pelvis, 05/25/2019 FINDINGS: CTA CHEST FINDINGS Cardiovascular: Preferential opacification of the thoracic aorta. Normal contour and caliber of the thoracic aorta. No evidence of aneurysm, dissection, or other acute aortic pathology. Aortic atherosclerosis. Normal heart size. Three-vessel coronary artery calcification. No pericardial effusion. Left chest multi lead pacer. Mediastinum/Nodes: Numerous prominent mediastinal and hilar lymph nodes. Thyroid gland, trachea, and esophagus demonstrate no significant findings. Lungs/Pleura: Moderate centrilobular emphysema. Bibasilar predominant irregular peripheral interstitial opacity with predominantly dependent small thin walled cysts, without clear evidence of subpleural bronchiolectasis or honeycombing. Irregular nodule of the left upper lobe (series 7, image 23). No pleural effusion or pneumothorax. Musculoskeletal: No chest wall abnormality. No acute or significant osseous findings. Review of the MIP images confirms the above findings. CTA ABDOMEN AND PELVIS FINDINGS VASCULAR Redemonstrated aneurysm of the infrarenal abdominal aorta, measuring up to 4.9 x 4.5 cm, enlarged compared to prior examination, at which time it measured 4.5 x 4.0 cm when measured similarly. There is a large burden of eccentric mural thrombus. No evidence of rupture or other acute complication. Incidental note of duplicated right renal arteries, with a small accessory inferior pole  right renal artery. Solitary left renal artery. Otherwise standard branching pattern of the abdominal aorta. Atherosclerosis at the aortic branch vessel origins without significant stenosis. Review of the MIP images confirms the above findings. NON-VASCULAR Hepatobiliary: No focal liver abnormality is seen. Status post cholecystectomy. No biliary dilatation. Pancreas: Unremarkable. No pancreatic ductal dilatation or surrounding inflammatory changes. Spleen: Normal in size without significant abnormality. Adrenals/Urinary Tract: Stable, definitively benign small fat containing left adrenal adenoma. Kidneys are normal, without renal calculi, solid lesion, or hydronephrosis. Bladder is unremarkable. Stomach/Bowel: Stomach is within normal limits. Appendix appears normal. No evidence of bowel wall thickening, distention, or inflammatory changes. Lymphatic: No enlarged abdominal or pelvic lymph nodes. Reproductive: No mass or other significant abnormality. Other: No abdominal wall hernia or abnormality. No abdominopelvic ascites. Musculoskeletal: No acute or significant osseous findings. Review of the MIP images confirms the above findings. IMPRESSION: 1. Redemonstrated aneurysm of the infrarenal abdominal aorta, measuring up to 4.9 x 4.5 cm, enlarged compared to prior examination dated 05/25/2019, at which time it measured 4.5 x 4.0 cm when measured similarly. There is a large burden of eccentric mural thrombus. No evidence of rupture or other acute complication. Recommend referral to a vascular specialist. This recommendation follows ACR consensus guidelines: White Paper of the ACR Incidental Findings Committee II on Vascular Findings. J Am Coll Radiol 2013; 10:789-794. 2. Normal contour and caliber of the thoracic aorta. No evidence of aneurysm, dissection, or other acute aortic pathology. 3. Bibasilar predominant irregular peripheral interstitial opacity with predominantly dependent small thin walled cysts, without  clear evidence of subpleural bronchiolectasis or honeycombing. Findings suggest pulmonary fibrosis, or alternately nonspecific sequelae of prior infection or inflammation, in an "alternative diagnosis" if characterized by ATS pulmonary fibrosis criteria, and could be further characterized by follow-up ILD protocol CT of the chest on a nonemergent basis if indicated by referable clinical signs and symptoms. 4. Emphysema. 5. Irregular nodule of the left upper lobe measures 7 mm, nonspecific. Non-contrast chest CT at 6-12 months is recommended. If the nodule is stable at time of repeat CT, then future CT at 18-24 months (from today's scan) is considered  optional for low-risk patients, but is recommended for high-risk patients. This recommendation follows the consensus statement: Guidelines for Management of Incidental Pulmonary Nodules Detected on CT Images: From the Fleischner Society 2017; Radiology 2017; 284:228-243. 6. Numerous prominent mediastinal and hilar lymph nodes, nonspecific and most likely reactive. 7. Coronary artery disease. Aortic Atherosclerosis (ICD10-I70.0) and Emphysema (ICD10-J43.9). Electronically Signed   By: Eddie Candle M.D.   On: 07/31/2021 20:45     Assessment & Plan:  Plan    Meds ordered this encounter  Medications   nitroGLYCERIN (NITROSTAT) 0.4 MG SL tablet    Sig: Place 1 tablet (0.4 mg total) under the tongue every 5 (five) minutes as needed for chest pain.    Dispense:  25 tablet    Refill:  1     Problem List Items Addressed This Visit     Hypokalemia    Was seen in ER with on 8/5 with dizziness, nausea, weakness. He was found to have low potassium, he was given some in the ER and his potassium increased from 2.8 to 3.0 and he has been taking the pills the ER gave him until a couple days ago and just had his first sense of dizziness just today for the first time. Recheck levels today      Relevant Orders   Comprehensive metabolic panel (Completed)   Dizziness     Has resolved but he felt light headed as opposed to vertiginous sympotms. He was found to electrolyte abnormalities and those were corrected.       Hypertension    Well controlled, no changes to meds. Encouraged heart healthy diet such as the DASH diet and exercise as tolerated.       Relevant Medications   nitroGLYCERIN (NITROSTAT) 0.4 MG SL tablet   Other Relevant Orders   Comprehensive metabolic panel (Completed)   Headache    Encouraged increased hydration, 64 ounces of clear fluids daily. Minimize alcohol and caffeine. Eat small frequent meals with lean proteins and complex carbs. Avoid high and low blood sugars. Get adequate sleep, 7-8 hours a night. Needs exercise daily preferably in the morning..       Renal insufficiency    Hydrate and monitor      Vitamin B12 deficiency    Supplement and monitor      Elevated lipase    Recheck levels with amylase today      Relevant Orders   Lipase (Completed)   Amylase (Completed)   Low magnesium level    Found in Er recently is taking daily magnesium tabs now recheck level      Relevant Orders   Magnesium (Completed)   Pulmonary nodule less than 1 cm in diameter with moderate to high risk for malignant neoplasm - Primary    Former smoker he started smoking at age 6 and quit in 2017 at age 8 he smoked between 1/2 ppd to 3/4 ppd. He is referred to pulmonology for further surveillance. Discussed the concept of low dose CT scan with patient and the need for annual screening on his part and he will consider      Relevant Orders   Ambulatory referral to Pulmonology   Chronic obstructive pulmonary disease ([place])    Worsening dyspnea and history of smoking is referred to pulmonology for further workup and treatment      Relevant Orders   Ambulatory referral to Pulmonology   Abdominal pain   Relevant Orders   Lipase (Completed)   Amylase (Completed)   CBC  w/Diff (Completed)    Follow-up: Return in about 11 weeks (around  11/03/2021).  I, Suezanne Jacquet, acting as a scribe for Penni Homans, MD, have documented all relevent documentation on behalf of Penni Homans, MD, as directed by Penni Homans, MD while in the presence of Penni Homans, MD.  I, Mosie Lukes, MD personally performed the services described in this documentation. All medical record entries made by the scribe were at my direction and in my presence. I have reviewed the chart and agree that the record reflects my personal performance and is accurate and complete

## 2021-08-18 NOTE — Assessment & Plan Note (Signed)
Well controlled, no changes to meds. Encouraged heart healthy diet such as the DASH diet and exercise as tolerated.  °

## 2021-08-18 NOTE — Assessment & Plan Note (Signed)
Encouraged increased hydration, 64 ounces of clear fluids daily. Minimize alcohol and caffeine. Eat small frequent meals with lean proteins and complex carbs. Avoid high and low blood sugars. Get adequate sleep, 7-8 hours a night. Needs exercise daily preferably in the morning.  

## 2021-08-19 DIAGNOSIS — Z9189 Other specified personal risk factors, not elsewhere classified: Secondary | ICD-10-CM | POA: Insufficient documentation

## 2021-08-19 DIAGNOSIS — J449 Chronic obstructive pulmonary disease, unspecified: Secondary | ICD-10-CM | POA: Insufficient documentation

## 2021-08-19 DIAGNOSIS — R109 Unspecified abdominal pain: Secondary | ICD-10-CM | POA: Insufficient documentation

## 2021-08-19 NOTE — Assessment & Plan Note (Addendum)
Former smoker he started smoking at age 75 and quit in 2017 at age 10 he smoked between 1/2 ppd to 3/4 ppd. He is referred to pulmonology for further surveillance. Discussed the concept of low dose CT scan with patient and the need for annual screening on his part and he will consider

## 2021-08-19 NOTE — Assessment & Plan Note (Signed)
Has resolved but he felt light headed as opposed to vertiginous sympotms. He was found to electrolyte abnormalities and those were corrected.

## 2021-08-19 NOTE — Assessment & Plan Note (Signed)
Supplement and monitor 

## 2021-08-19 NOTE — Assessment & Plan Note (Signed)
Worsening dyspnea and history of smoking is referred to pulmonology for further workup and treatment

## 2021-08-19 NOTE — Assessment & Plan Note (Signed)
Hydrate and monitor 

## 2021-08-26 ENCOUNTER — Other Ambulatory Visit: Payer: Self-pay | Admitting: *Deleted

## 2021-08-26 DIAGNOSIS — I714 Abdominal aortic aneurysm, without rupture, unspecified: Secondary | ICD-10-CM

## 2021-08-27 ENCOUNTER — Encounter: Payer: Self-pay | Admitting: *Deleted

## 2021-09-07 ENCOUNTER — Telehealth: Payer: Self-pay

## 2021-09-07 DIAGNOSIS — R748 Abnormal levels of other serum enzymes: Secondary | ICD-10-CM

## 2021-09-07 NOTE — Telephone Encounter (Signed)
Labs reviewed with patient, no questions at this time.  Lab appt scheduled for repeat Lipase.

## 2021-09-10 ENCOUNTER — Other Ambulatory Visit: Payer: Self-pay

## 2021-09-11 MED ORDER — ATORVASTATIN CALCIUM 40 MG PO TABS: 40.0000 mg | ORAL_TABLET | Freq: Every day | ORAL | 0 refills | Status: DC

## 2021-09-14 ENCOUNTER — Other Ambulatory Visit (INDEPENDENT_AMBULATORY_CARE_PROVIDER_SITE_OTHER): Payer: Medicare Other

## 2021-09-14 ENCOUNTER — Other Ambulatory Visit: Payer: Self-pay

## 2021-09-14 DIAGNOSIS — R748 Abnormal levels of other serum enzymes: Secondary | ICD-10-CM

## 2021-09-14 LAB — LIPASE: Lipase: 67 U/L — ABNORMAL HIGH (ref 11.0–59.0)

## 2021-09-15 ENCOUNTER — Other Ambulatory Visit (HOSPITAL_COMMUNITY): Payer: Medicare Other

## 2021-09-16 ENCOUNTER — Ambulatory Visit (HOSPITAL_BASED_OUTPATIENT_CLINIC_OR_DEPARTMENT_OTHER)
Admission: RE | Admit: 2021-09-16 | Discharge: 2021-09-16 | Disposition: A | Discharge status: Home / Self Care | Payer: Medicare Other | Source: Ambulatory Visit | Attending: Cardiology | Admitting: Cardiology

## 2021-09-16 ENCOUNTER — Other Ambulatory Visit: Payer: Self-pay

## 2021-09-16 DIAGNOSIS — I351 Nonrheumatic aortic (valve) insufficiency: Secondary | ICD-10-CM | POA: Diagnosis not present

## 2021-09-17 ENCOUNTER — Ambulatory Visit (HOSPITAL_COMMUNITY)
Admission: RE | Admit: 2021-09-17 | Discharge: 2021-09-17 | Disposition: A | Discharge status: Home / Self Care | Payer: Medicare Other | Source: Ambulatory Visit | Attending: Cardiology | Admitting: Cardiology

## 2021-09-17 ENCOUNTER — Telehealth: Payer: Self-pay | Admitting: *Deleted

## 2021-09-17 ENCOUNTER — Other Ambulatory Visit (HOSPITAL_COMMUNITY): Payer: Self-pay | Admitting: Cardiology

## 2021-09-17 DIAGNOSIS — I714 Abdominal aortic aneurysm, without rupture, unspecified: Secondary | ICD-10-CM

## 2021-09-17 LAB — ECHOCARDIOGRAM COMPLETE
AR max vel: 2.29 cm2
AV Area VTI: 2.31 cm2
AV Area mean vel: 2.11 cm2
AV Mean grad: 7 mmHg
AV Peak grad: 15.6 mmHg
Ao pk vel: 1.98 m/s
Calc EF: 37.6 %
P 1/2 time: 506 msec
S' Lateral: 4.67 cm
Single Plane A2C EF: 31.3 %
Single Plane A4C EF: 42.2 %

## 2021-09-17 NOTE — Telephone Encounter (Signed)
Spoke with pt, referral placed.  

## 2021-09-17 NOTE — Telephone Encounter (Signed)
-----   Message from Lelon Perla, MD sent at 09/17/2021 10:15 AM EDT ----- Arrange consult with vascular surgery. Kirk Ruths

## 2021-09-21 ENCOUNTER — Encounter: Payer: Self-pay | Admitting: Cardiology

## 2021-09-21 ENCOUNTER — Ambulatory Visit (INDEPENDENT_AMBULATORY_CARE_PROVIDER_SITE_OTHER): Payer: Medicare Other | Admitting: Cardiology

## 2021-09-21 ENCOUNTER — Other Ambulatory Visit: Payer: Self-pay

## 2021-09-21 DIAGNOSIS — I2510 Atherosclerotic heart disease of native coronary artery without angina pectoris: Secondary | ICD-10-CM | POA: Diagnosis not present

## 2021-09-21 MED ORDER — METOPROLOL SUCCINATE ER 100 MG PO TB24: 100.0000 mg | ORAL_TABLET | Freq: Every day | ORAL | 1 refills | Status: DC

## 2021-09-21 NOTE — Patient Instructions (Addendum)
Medication Instructions:  Your physician has recommended you make the following change in your medication:  TAKE Toprol 100 mg once daily at bedtime  *If you need a refill on your cardiac medications before your next appointment, please call your pharmacy*   Lab Work: None ordered If you have labs (blood work) drawn today and your tests are completely normal, you will receive your results only by: [place] (if you have MyChart) OR A paper copy in the mail If you have any lab test that is abnormal or we need to change your treatment, we will call you to review the results.   Testing/Procedures: None ordered   Follow-Up: At [facility], you and your health needs are our priority.  As part of our continuing mission to provide you with exceptional heart care, we have created designated Provider Care Teams.  These Care Teams include your primary Cardiologist (physician) and Advanced Practice Providers (APPs -  Physician Assistants and Nurse Practitioners) who all work together to provide you with the care you need, when you need it.  We recommend signing up for the patient portal called "MyChart".  Sign up information is provided on this After Visit Summary.  MyChart is used to connect with patients for Virtual Visits (Telemedicine).  Patients are able to view lab/test results, encounter notes, upcoming appointments, etc.  Non-urgent messages can be sent to your provider as well.   To learn more about what you can do with MyChart, go to [email or web address].    Remote monitoring is used to monitor your Pacemaker or ICD from home. This monitoring reduces the number of office visits required to check your device to one time per year. It allows [place] to keep an eye on the functioning of your device to ensure it is working properly. You are scheduled for a device check from home on 09/29/2021. You may send your transmission at any time that day. If you have a wireless device, the  transmission will be sent automatically. After your physician reviews your transmission, you will receive a postcard with your next transmission date.  Your next appointment:   6 month(s)  The format for your next appointment:   In Person  Provider:   Allegra Lai, MD   Thank you for choosing [facility]!!   Trinidad Curet, RN [phone]    Other Instructions

## 2021-09-21 NOTE — Progress Notes (Signed)
Electrophysiology Office Note   Date:  09/21/2021   ID:  Jason [name], DOB May 13, 1946, MRN [id number]  PCP:  Jason Lukes, MD  Cardiologist:  Jason [name] Primary Electrophysiologist:  Jason Haw, MD    No chief complaint on file.    History of Present Illness: Jason [name] is a 75 y.o. male who is being seen today for the evaluation of atrial fibrillation at the request of Jason [name]. Presenting today for electrophysiology evaluation.    He has a history significant for coronary artery disease, paroxysmal atrial fibrillation, tachybradycardia syndrome status post Medtronic dual-chamber pacemaker, CLL, CVA, hypertension, hyperlipidemia, AAA.  He is status post Medtronic dual-chamber pacemaker implanted 04/16/2016.  Today, denies symptoms of palpitations, chest pain, shortness of breath, orthopnea, PND, lower extremity edema, claudication, dizziness, presyncope, syncope, bleeding, or neurologic sequela. The patient is tolerating medications without difficulties.  Since being seen he has done well.  He continues to have short episodes of atrial fibrillation, though they are all less than 8 hours, most of which less than 1 hour.  He has weakness and fatigue.  He is currently on metoprolol and taking it during the day.  Past Medical History:  Diagnosis Date   Anemia 05/08/2015   CML (chronic myelocytic leukemia) ([place]) 12/16/2011   Coronary heart disease    CVA (cerebrovascular accident) ([place])    GERD (gastroesophageal reflux disease)    Grief reaction 03/18/2014   H/O tobacco use, presenting hazards to health 02/24/2012   No cigarettes since pacemaker placement. Encouraged ongoing efforts    History of chicken pox    History of kidney stones    Hyperlipidemia    Hypertension    IBS (irritable bowel syndrome) 09/20/2013   Medicare annual wellness visit, subsequent 03/23/2014   PAF (paroxysmal atrial fibrillation) (H[facility])    Symptomatic bradycardia    a. symptomatic  bradycardia due to sinus node dysfunction s/p RA and RV Medtronic pacemaker 04/15/16   Past Surgical History:  Procedure Laterality Date   CATARACT EXTRACTION     CHOLECYSTECTOMY  2003   CORONARY ANGIOPLASTY WITH STENT PLACEMENT  2000   EP IMPLANTABLE DEVICE N/A 04/15/2016   Procedure: Pacemaker Implant;  Surgeon: Jason Lance, MD; Medtronic (serial number [id number]) pacemaker; Laterality: Left     Current Outpatient Medications  Medication Sig Dispense Refill   acetaminophen (TYLENOL) 325 MG tablet Take 650 mg by mouth every 6 (six) hours as needed. For headache.     amiodarone (PACERONE) 200 MG tablet TAKE 1/2 TABLET BY MOUTH EVERY DAY 45 tablet 1   amLODipine (NORVASC) 5 MG tablet Take 1 tablet (5 mg total) by mouth daily. 90 tablet 1   atorvastatin (LIPITOR) 40 MG tablet Take 1 tablet (40 mg total) by mouth daily. 30 tablet 0   dicyclomine (BENTYL) 20 MG tablet Take 1 tablet (20 mg total) by mouth 2 (two) times daily. 20 tablet 0   ferrous sulfate 325 (65 FE) MG EC tablet Take 1 tablet (325 mg total) by mouth 2 (two) times daily. 60 tablet 11   fluticasone (FLONASE) 50 MCG/ACT nasal spray Place 2 sprays into both nostrils daily. 48 g 3   imatinib ([name]) 400 MG tablet TAKE 1 TABLET (400MG  TOTAL) BY MOUTH DAILY. TAKE WITH A MEAL AND LARGE GLASS OF WATER. 30 tablet 6   levothyroxine (SYNTHROID) 50 MCG tablet Take 1 tablet (50 mcg total) by mouth daily before breakfast. 30 tablet 3   losartan (COZAAR) 100 MG tablet Take 1  tablet (100 mg total) by mouth daily. 90 tablet 1   magnesium oxide (MAG-OX) 400 MG tablet Take 1 tablet (400 mg total) by mouth daily. 30 tablet 0   meclizine (ANTIVERT) 25 MG tablet TAKE 1 TABLET THREE TIMES DAILY 90 tablet 0   nitroGLYCERIN (NITROSTAT) 0.4 MG SL tablet Place 1 tablet (0.4 mg total) under the tongue every 5 (five) minutes as needed for chest pain. 25 tablet 1   ofloxacin (FLOXIN OTIC) 0.3 % OTIC solution Place 10 drops into both ears daily. For 7  days 10 mL 0   omeprazole (PRILOSEC) 40 MG capsule TAKE 1 CAPSULE EVERY DAY (NEED OFFICE VISIT FOR GREATER QUANTITIES/REFILLS) 90 capsule 0   ondansetron (ZOFRAN) 4 MG tablet Take 1 tablet (4 mg total) by mouth 2 (two) times daily as needed for nausea or vomiting. 180 tablet 1   potassium chloride SA (KLOR-CON) 20 MEQ tablet Take 1 tablet (20 mEq total) by mouth 3 (three) times daily. 15 tablet 0   Probiotic Product (PROBIOTIC FORMULA PO) Take 1 capsule by mouth daily.      rivaroxaban (XARELTO) 20 MG TABS tablet TAKE 1 TABLET EVERY DAY WITH SUPPER 90 tablet 1   Saccharomyces boulardii (PROBIOTIC) 250 MG CAPS Take 250 mg by mouth daily. 90 capsule 1   metoprolol succinate (TOPROL XL) 100 MG 24 hr tablet Take 1 tablet (100 mg total) by mouth daily. 90 tablet 1   Current Facility-Administered Medications  Medication Dose Route Frequency Provider Last Rate Last Admin   cyanocobalamin ((VITAMIN B-12)) injection 1,000 mcg  1,000 mcg Intramuscular Q30 days Jason Lukes, MD   1,000 mcg at 07/15/20 7106    Allergies:   Patient has no known allergies.   Social History:  The patient  reports that he quit smoking about 5 years ago. His smoking use included cigarettes. He started smoking about 53 years ago. He has a 10.00 pack-year smoking history. He has never used smokeless tobacco. He reports that he does not drink alcohol and does not use drugs.   Family History:  The patient's family history includes Alcohol abuse in his father; Arthritis in his mother; Cancer in his mother; Diabetes in his son; Heart disease in his mother; Hypertension in his father and mother; Other in his mother; Rheumatic fever in his mother.   ROS:  Please see the history of present illness.   Otherwise, review of systems is positive for none.   All other systems are reviewed and negative.   PHYSICAL EXAM: VS:  BP 124/72   Pulse 82   Resp 18   Ht 5\' 8"  (1.727 m)   Wt 181 lb (82.1 kg)   SpO2 97%   BMI 27.52 kg/m  , BMI  Body mass index is 27.52 kg/m. GEN: Well nourished, well developed, in no acute distress  HEENT: normal  Neck: no JVD, carotid bruits, or masses Cardiac: RRR; no murmurs, rubs, or gallops,no edema  Respiratory:  clear to auscultation bilaterally, normal work of breathing GI: soft, nontender, nondistended, + BS MS: no deformity or atrophy  Skin: warm and dry, device site well healed Neuro:  Strength and sensation are intact Psych: euthymic mood, full affect  EKG:  EKG is not ordered today. Personal review of the ekg ordered 07/31/21 shows atrial paced, right bundle branch block, left anterior fascicular block   Personal review of the device interrogation today. Results in [place]: 07/31/2021: B Natriuretic Peptide 318.5; TSH 7.836 08/18/2021: ALT 13;  BUN 9; Creatinine, Ser 1.12; Hemoglobin 13.6; Magnesium 1.9; Platelets 192.0; Potassium 4.5; Sodium 142    Lipid Panel     Component Value Date/Time   CHOL 130 03/24/2021 1117   TRIG 94.0 03/24/2021 1117   HDL 37.20 (L) 03/24/2021 1117   CHOLHDL 3 03/24/2021 1117   VLDL 18.8 03/24/2021 1117   LDLCALC 74 03/24/2021 1117   LDLCALC 59 09/11/2020 1125     Wt Readings from Last 3 Encounters:  09/21/21 181 lb (82.1 kg)  08/18/21 180 lb (81.6 kg)  07/31/21 182 lb 15.7 oz (83 kg)      Other studies Reviewed: Additional studies/ records that were reviewed today include: TTE 04/16/16  Review of the above records today demonstrates:  - Left ventricle: The cavity size was normal. Wall thickness was   normal. Systolic function was mildly to moderately reduced. The   estimated ejection fraction was in the range of 40% to 45%.   Diffuse hypokinesis. Doppler parameters are consistent with   abnormal left ventricular relaxation (grade 1 diastolic   dysfunction). - Aortic valve: Trileaflet; mildly calcified leaflets. There was   mild to moderate regurgitation. - Aorta: Mildly dilated aortic root. Dilated ascending aorta.    Ascending aorta: 44 mm. Aortic root dimension: 37 mm (ED). - Mitral valve: There was no significant regurgitation. - Right ventricle: The cavity size was normal. Pacer wire or   catheter noted in right ventricle. Systolic function was normal. - Pulmonary arteries: No complete TR doppler jet so unable to   estimate PA systolic pressure. - Inferior vena cava: The vessel was normal in size. The   respirophasic diameter changes were in the normal range (>= 50%),   consistent with normal central venous pressure.   ASSESSMENT AND PLAN:  1.  Tachybradycardia syndrome: Status post Medtronic dual-chamber pacemaker implanted in 2017.  Device functioning appropriately.  No changes at this time.  2.  Paroxysmal atrial fibrillation: Currently on Xarelto and amiodarone.  CHA2DS2-VASc of 5.  High risk medication monitoring.  He continues to have very short episodes of atrial fibrillation.  His atrial fibrillation is at times fast.  He has been taking metoprolol tartrate as he has not had a prescription for Toprol-XL.  We Ender Rorke switch to Toprol-XL 100 mg.  3.  Coronary artery disease: Currently on aspirin, Toprol-XL 100 mg twice daily.  No current chest pain.  4.  Hypertension: Currently well controlled  5.  Hyperlipidemia: Continue atorvastatin 40 mg daily.  The patient does not have concerns regarding his medicines.  The following changes were made today: None  Labs/ tests ordered today include:  No orders of the defined types were placed in this encounter.    Disposition:   FU with Tarri Guilfoil 12 months  Signed, Samon Dishner Meredith Leeds, MD  09/21/2021 2:22 PM     [place] [address] [phone] (office) [phone] (fax)

## 2021-09-22 ENCOUNTER — Other Ambulatory Visit: Payer: Self-pay | Admitting: Family Medicine

## 2021-10-03 ENCOUNTER — Other Ambulatory Visit: Payer: Self-pay | Admitting: Hematology & Oncology

## 2021-10-03 DIAGNOSIS — C9210 Chronic myeloid leukemia, BCR/ABL-positive, not having achieved remission: Secondary | ICD-10-CM

## 2021-10-07 ENCOUNTER — Telehealth: Payer: Self-pay | Admitting: Family Medicine

## 2021-10-07 ENCOUNTER — Other Ambulatory Visit: Payer: Self-pay

## 2021-10-07 MED ORDER — OMEPRAZOLE 40 MG PO CPDR: DELAYED_RELEASE_CAPSULE | ORAL | 0 refills | Status: DC

## 2021-10-07 NOTE — Telephone Encounter (Signed)
Medication: omeprazole (PRILOSEC) 40 MG capsule  Has the patient contacted their pharmacy? No. (If no, request that the patient contact the pharmacy for the refill.) (If yes, when and what did the pharmacy advise?)  Preferred Pharmacy (with phone number or street name): [place], [place] [address]  Phone:  [phone]  Fax:  [phone]  Agent: Please be advised that RX refills may take up to 3 business days. We ask that you follow-up with your pharmacy.

## 2021-10-07 NOTE — Telephone Encounter (Signed)
Medication sent.

## 2021-10-08 ENCOUNTER — Other Ambulatory Visit: Payer: Self-pay | Admitting: Cardiology

## 2021-10-12 ENCOUNTER — Other Ambulatory Visit: Payer: Self-pay | Admitting: *Deleted

## 2021-10-12 ENCOUNTER — Telehealth: Payer: Self-pay | Admitting: Cardiology

## 2021-10-12 MED ORDER — ATORVASTATIN CALCIUM 40 MG PO TABS: 40.0000 mg | ORAL_TABLET | Freq: Every day | ORAL | 3 refills | Status: DC

## 2021-10-12 NOTE — Telephone Encounter (Signed)
*  STAT* If patient is at the pharmacy, call can be transferred to refill team.   1. Which medications need to be refilled? (please list name of each medication and dose if known) atorvastatin (LIPITOR) 40 MG tablet  2. Which pharmacy/location (including street and city if local pharmacy) is medication to be sent to? [facility]  3. Do they need a 30 day or 90 day supply? 90 day

## 2021-10-14 ENCOUNTER — Encounter: Payer: Self-pay | Admitting: *Deleted

## 2021-10-16 ENCOUNTER — Ambulatory Visit (INDEPENDENT_AMBULATORY_CARE_PROVIDER_SITE_OTHER): Payer: Medicare Other

## 2021-10-16 DIAGNOSIS — I495 Sick sinus syndrome: Secondary | ICD-10-CM

## 2021-10-19 LAB — CUP PACEART REMOTE DEVICE CHECK
Battery Remaining Longevity: 56 mo
Battery Voltage: 2.99 V
Brady Statistic AP VP Percent: 0.42 %
Brady Statistic AP VS Percent: 90.88 %
Brady Statistic AS VP Percent: 0.04 %
Brady Statistic AS VS Percent: 8.66 %
Brady Statistic RA Percent Paced: 66.03 %
Brady Statistic RV Percent Paced: 0.53 %
Date Time Interrogation Session: 20221020095454
Implantable Lead Implant Date: 20170420
Implantable Lead Implant Date: 20170420
Implantable Lead Location: 753859
Implantable Lead Location: 753860
Implantable Lead Model: 5076
Implantable Lead Model: 5076
Implantable Pulse Generator Implant Date: 20170420
Lead Channel Impedance Value: 323 Ohm
Lead Channel Impedance Value: 323 Ohm
Lead Channel Impedance Value: 380 Ohm
Lead Channel Impedance Value: 437 Ohm
Lead Channel Pacing Threshold Amplitude: 0.625 V
Lead Channel Pacing Threshold Amplitude: 0.625 V
Lead Channel Pacing Threshold Pulse Width: 0.4 ms
Lead Channel Pacing Threshold Pulse Width: 0.4 ms
Lead Channel Sensing Intrinsic Amplitude: 1.5 mV
Lead Channel Sensing Intrinsic Amplitude: 1.5 mV
Lead Channel Sensing Intrinsic Amplitude: 9.375 mV
Lead Channel Sensing Intrinsic Amplitude: 9.375 mV
Lead Channel Setting Pacing Amplitude: 2 V
Lead Channel Setting Pacing Amplitude: 2.5 V
Lead Channel Setting Pacing Pulse Width: 0.4 ms
Lead Channel Setting Sensing Sensitivity: 2 mV

## 2021-10-23 NOTE — Progress Notes (Signed)
Remote pacemaker transmission.   

## 2021-10-25 ENCOUNTER — Other Ambulatory Visit: Payer: Self-pay | Admitting: Cardiology

## 2021-10-26 NOTE — Telephone Encounter (Signed)
Prescription refill request for Xarelto received.   Indication: afib  Last office visit: 09/21/2021, [place] Weight: 82.1 kg  Age: 75 yo  Scr: 1.12, 08/18/2021 CrCl: 68 ml/min   Refill sent.

## 2021-11-12 ENCOUNTER — Ambulatory Visit (INDEPENDENT_AMBULATORY_CARE_PROVIDER_SITE_OTHER): Payer: Medicare Other | Admitting: Family Medicine

## 2021-11-12 ENCOUNTER — Encounter: Payer: Self-pay | Admitting: Family Medicine

## 2021-11-12 ENCOUNTER — Other Ambulatory Visit: Payer: Self-pay

## 2021-11-12 VITALS — BP 116/68 | HR 89 | Temp 97.5°F | Resp 16 | Wt 184.6 lb

## 2021-11-12 DIAGNOSIS — I10 Essential (primary) hypertension: Secondary | ICD-10-CM | POA: Diagnosis not present

## 2021-11-12 DIAGNOSIS — Z9189 Other specified personal risk factors, not elsewhere classified: Secondary | ICD-10-CM

## 2021-11-12 DIAGNOSIS — R748 Abnormal levels of other serum enzymes: Secondary | ICD-10-CM

## 2021-11-12 DIAGNOSIS — Z23 Encounter for immunization: Secondary | ICD-10-CM | POA: Diagnosis not present

## 2021-11-12 DIAGNOSIS — Z87891 Personal history of nicotine dependence: Secondary | ICD-10-CM

## 2021-11-12 DIAGNOSIS — E538 Deficiency of other specified B group vitamins: Secondary | ICD-10-CM

## 2021-11-12 DIAGNOSIS — R911 Solitary pulmonary nodule: Secondary | ICD-10-CM

## 2021-11-12 DIAGNOSIS — E782 Mixed hyperlipidemia: Secondary | ICD-10-CM

## 2021-11-12 DIAGNOSIS — R918 Other nonspecific abnormal finding of lung field: Secondary | ICD-10-CM | POA: Diagnosis not present

## 2021-11-12 DIAGNOSIS — R790 Abnormal level of blood mineral: Secondary | ICD-10-CM | POA: Diagnosis not present

## 2021-11-12 DIAGNOSIS — C9210 Chronic myeloid leukemia, BCR/ABL-positive, not having achieved remission: Secondary | ICD-10-CM

## 2021-11-12 DIAGNOSIS — K589 Irritable bowel syndrome without diarrhea: Secondary | ICD-10-CM

## 2021-11-12 DIAGNOSIS — I714 Abdominal aortic aneurysm, without rupture, unspecified: Secondary | ICD-10-CM

## 2021-11-12 DIAGNOSIS — I2510 Atherosclerotic heart disease of native coronary artery without angina pectoris: Secondary | ICD-10-CM

## 2021-11-12 DIAGNOSIS — I495 Sick sinus syndrome: Secondary | ICD-10-CM

## 2021-11-12 LAB — COMPREHENSIVE METABOLIC PANEL
ALT: 9 U/L (ref 0–53)
AST: 16 U/L (ref 0–37)
Albumin: 4.1 g/dL (ref 3.5–5.2)
Alkaline Phosphatase: 83 U/L (ref 39–117)
BUN: 9 mg/dL (ref 6–23)
CO2: 27 mEq/L (ref 19–32)
Calcium: 8.8 mg/dL (ref 8.4–10.5)
Chloride: 107 mEq/L (ref 96–112)
Creatinine, Ser: 1.19 mg/dL (ref 0.40–1.50)
GFR: 60.54 mL/min (ref >60.00)
Glucose, Bld: 86 mg/dL (ref 70–99)
Potassium: 3.7 mEq/L (ref 3.5–5.1)
Sodium: 143 mEq/L (ref 135–145)
Total Bilirubin: 0.6 mg/dL (ref 0.2–1.2)
Total Protein: 7 g/dL (ref 6.0–8.3)

## 2021-11-12 LAB — LIPID PANEL
Cholesterol: 118 mg/dL (ref 0–200)
HDL: 37.2 mg/dL — ABNORMAL LOW (ref >39.00)
LDL Cholesterol: 63 mg/dL (ref 0–99)
NonHDL: 80.83
Total CHOL/HDL Ratio: 3
Triglycerides: 87 mg/dL (ref 0.0–149.0)
VLDL: 17.4 mg/dL (ref 0.0–40.0)

## 2021-11-12 LAB — CBC
HCT: 42.1 % (ref 39.0–52.0)
Hemoglobin: 13.9 g/dL (ref 13.0–17.0)
MCHC: 32.9 g/dL (ref 30.0–36.0)
MCV: 95.9 fl (ref 78.0–100.0)
Platelets: 206 10*3/uL (ref 150.0–400.0)
RBC: 4.39 Mil/uL (ref 4.22–5.81)
RDW: 14.9 % (ref 11.5–15.5)
WBC: 9.1 10*3/uL (ref 4.0–10.5)

## 2021-11-12 LAB — VITAMIN B12: Vitamin B-12: 821 pg/mL (ref 211–911)

## 2021-11-12 LAB — MAGNESIUM: Magnesium: 1.3 mg/dL — ABNORMAL LOW (ref 1.5–2.5)

## 2021-11-12 LAB — TSH: TSH: 8.85 u[IU]/mL — ABNORMAL HIGH (ref 0.35–5.50)

## 2021-11-12 LAB — VITAMIN D 25 HYDROXY (VIT D DEFICIENCY, FRACTURES): VITD: <7 ng/mL — ABNORMAL LOW (ref 30.00–100.00)

## 2021-11-12 LAB — LIPASE: Lipase: 296 U/L — ABNORMAL HIGH (ref 11.0–59.0)

## 2021-11-12 NOTE — Patient Instructions (Signed)
Call Dr. Jacalyn Lefevre office to schedule for appointment  Add benefiber powder once daily 60-80 ounces.   Add a probiotic daily (such as phillips colon health probiotic or from the now company)  Drink 64 oz of water a day, eat lots of fresh fruit and veggies, ensure regular exercise.   Constipation, Adult Constipation is when a person has trouble pooping (having a bowel movement). When you have this condition, you may poop fewer than 3 times a week. Your poop (stool) may also be dry, hard, or bigger than normal. Follow these instructions at home: Eating and drinking  Eat foods that have a lot of fiber, such as: Fresh fruits and vegetables. Whole grains. Beans. Eat less of foods that are low in fiber and high in fat and sugar, such as: [place] fries. Hamburgers. Cookies. Candy. Soda. Drink enough fluid to keep your pee (urine) pale yellow. General instructions Exercise regularly or as told by your doctor. Try to do 150 minutes of exercise each week. Go to the restroom when you feel like you need to poop. Do not hold it in. Take over-the-counter and prescription medicines only as told by your doctor. These include any fiber supplements. When you poop: Do deep breathing while relaxing your lower belly (abdomen). Relax your pelvic floor. The pelvic floor is a group of muscles that support the rectum, bladder, and intestines (as well as the uterus in women). Watch your condition for any changes. Tell your doctor if you notice any. Keep all follow-up visits as told by your doctor. This is important. Contact a doctor if: You have pain that gets worse. You have a fever. You have not pooped for 4 days. You vomit. You are not hungry. You lose weight. You are bleeding from the opening of the butt (anus). You have thin, pencil-like poop. Get help right away if: You have a fever, and your symptoms suddenly get worse. You leak poop or have blood in your poop. Your belly feels hard or  bigger than normal (bloated). You have very bad belly pain. You feel dizzy or you faint. Summary Constipation is when a person poops fewer than 3 times a week, has trouble pooping, or has poop that is dry, hard, or bigger than normal. Eat foods that have a lot of fiber. Drink enough fluid to keep your pee (urine) pale yellow. Take over-the-counter and prescription medicines only as told by your doctor. These include any fiber supplements. This information is not intended to replace advice given to you by your health care provider. Make sure you discuss any questions you have with your health care provider. Document Revised: 10/31/2019 Document Reviewed: 10/31/2019 Elsevier Patient Education  [place].

## 2021-11-12 NOTE — Assessment & Plan Note (Signed)
Following with cardiology and vascular.

## 2021-11-12 NOTE — Assessment & Plan Note (Signed)
Tolerating statin, encouraged heart healthy diet, avoid trans fats, minimize simple carbs and saturated fats. Increase exercise as tolerated 

## 2021-11-12 NOTE — Assessment & Plan Note (Signed)
Supplement and monitor 

## 2021-11-12 NOTE — Assessment & Plan Note (Signed)
And a second opacity that further evaluation was recommended for so CT is ordered. He is noting persistent DOE

## 2021-11-12 NOTE — Assessment & Plan Note (Signed)
Continues to abstain 

## 2021-11-12 NOTE — Progress Notes (Signed)
Patient ID: Jason [name], male    DOB: [birth year]  Age: 75 y.o. MRN: [id number]    Subjective:   No chief complaint on file.  Subjective   HPI [name] presents for office visit today for follow up on CAD and constipation. He endorses smoking cession since 2017, however he is still exposed to secondhand smoking. He has c/o constipation since he was not able to get his probiotic. He reports that a couple of weeks ago he had acute heart burn which triggered his SOB. He denies any nausea, diaphoresis, or abdominal pain during episode. Denies CP/palp//HA/congestion/fevers or GU c/o. Taking meds as prescribed.   Review of Systems  Constitutional:  Negative for chills, fatigue and fever.  HENT:  Negative for congestion, rhinorrhea, sinus pressure, sinus pain and sore throat.   Eyes:  Negative for pain.  Respiratory:  Positive for shortness of breath. Negative for cough.   Cardiovascular:  Negative for chest pain, palpitations and leg swelling.  Gastrointestinal:  Positive for constipation. Negative for abdominal pain, blood in stool, diarrhea, nausea and vomiting.  Genitourinary:  Negative for flank pain, frequency and penile pain.  Musculoskeletal:  Negative for back pain.  Neurological:  Negative for light-headedness and headaches.   History Past Medical History:  Diagnosis Date   Anemia 05/08/2015   CML (chronic myelocytic leukemia) ([place]) 12/16/2011   Coronary heart disease    CVA (cerebrovascular accident) ([place])    GERD (gastroesophageal reflux disease)    Grief reaction 03/18/2014   H/O tobacco use, presenting hazards to health 02/24/2012   No cigarettes since pacemaker placement. Encouraged ongoing efforts    History of chicken pox    History of kidney stones    Hyperlipidemia    Hypertension    IBS (irritable bowel syndrome) 09/20/2013   Medicare annual wellness visit, subsequent 03/23/2014   PAF (paroxysmal atrial fibrillation) (HCC)    Symptomatic bradycardia    a.  symptomatic bradycardia due to sinus node dysfunction s/p RA and RV [employer] pacemaker 04/15/16    He has a past surgical history that includes Cholecystectomy (2003); Coronary angioplasty with stent (2000); Cataract extraction; and Cardiac catheterization (N/A, 04/15/2016).   His family history includes Alcohol abuse in his father; Arthritis in his mother; Cancer in his mother; Diabetes in his son; Heart disease in his mother; Hypertension in his father and mother; Other in his mother; Rheumatic fever in his mother.He reports that he quit smoking about 5 years ago. His smoking use included cigarettes. He started smoking about 54 years ago. He has a 10.00 pack-year smoking history. He has never used smokeless tobacco. He reports that he does not drink alcohol and does not use drugs.  Current Outpatient Medications on File Prior to Visit  Medication Sig Dispense Refill   acetaminophen (TYLENOL) 325 MG tablet Take 650 mg by mouth every 6 (six) hours as needed. For headache.     amiodarone (PACERONE) 200 MG tablet Take 1/2 (one-half) tablet by mouth once daily 45 tablet 3   amLODipine (NORVASC) 5 MG tablet Take 1 tablet by mouth once daily 90 tablet 0   atorvastatin (LIPITOR) 40 MG tablet Take 1 tablet (40 mg total) by mouth daily. 90 tablet 3   dicyclomine (BENTYL) 20 MG tablet Take 1 tablet (20 mg total) by mouth 2 (two) times daily. 20 tablet 0   EUTHYROX 50 MCG tablet TAKE 1 TABLET BY MOUTH ONCE DAILY BEFORE BREAKFAST 30 tablet 0   ferrous sulfate 325 (65 FE) MG  EC tablet Take 1 tablet (325 mg total) by mouth 2 (two) times daily. 60 tablet 11   fluticasone (FLONASE) 50 MCG/ACT nasal spray Place 2 sprays into both nostrils daily. 48 g 3   imatinib ([name]) 400 MG tablet TAKE 1 TABLET (400MG  TOTAL) BY MOUTH DAILY. TAKE WITH A MEAL AND LARGE GLASS OF WATER. 30 tablet 6   losartan (COZAAR) 100 MG tablet Take 1 tablet (100 mg total) by mouth daily. 90 tablet 1   magnesium oxide (MAG-OX) 400 MG tablet  Take 1 tablet (400 mg total) by mouth daily. 30 tablet 0   meclizine (ANTIVERT) 25 MG tablet TAKE 1 TABLET THREE TIMES DAILY 90 tablet 0   metoprolol succinate (TOPROL XL) 100 MG 24 hr tablet Take 1 tablet (100 mg total) by mouth daily. 90 tablet 1   nitroGLYCERIN (NITROSTAT) 0.4 MG SL tablet Place 1 tablet (0.4 mg total) under the tongue every 5 (five) minutes as needed for chest pain. 25 tablet 1   ofloxacin (FLOXIN OTIC) 0.3 % OTIC solution Place 10 drops into both ears daily. For 7 days 10 mL 0   omeprazole (PRILOSEC) 40 MG capsule TAKE 1 CAPSULE EVERY DAY (NEED OFFICE VISIT FOR GREATER QUANTITIES/REFILLS) 90 capsule 0   ondansetron (ZOFRAN) 4 MG tablet Take 1 tablet (4 mg total) by mouth 2 (two) times daily as needed for nausea or vomiting. 180 tablet 1   potassium chloride SA (KLOR-CON) 20 MEQ tablet Take 1 tablet (20 mEq total) by mouth 3 (three) times daily. 15 tablet 0   Probiotic Product (PROBIOTIC FORMULA PO) Take 1 capsule by mouth daily.      rivaroxaban (XARELTO) 20 MG TABS tablet TAKE 1 TABLET BY MOUTH ONCE DAILY WITH SUPPER 90 tablet 1   Saccharomyces boulardii (PROBIOTIC) 250 MG CAPS Take 250 mg by mouth daily. 90 capsule 1   Current Facility-Administered Medications on File Prior to Visit  Medication Dose Route Frequency Provider Last Rate Last Admin   cyanocobalamin ((VITAMIN B-12)) injection 1,000 mcg  1,000 mcg Intramuscular Q30 days Mosie Lukes, MD   1,000 mcg at 07/15/20 7510     Objective:  Objective  Physical Exam Constitutional:      General: He is not in acute distress.    Appearance: Normal appearance. He is not ill-appearing or toxic-appearing.  HENT:     Head: Normocephalic and atraumatic.     Right Ear: Tympanic membrane, ear canal and external ear normal.     Left Ear: Tympanic membrane, ear canal and external ear normal.     Nose: No congestion or rhinorrhea.  Eyes:     Extraocular Movements: Extraocular movements intact.     Pupils: Pupils are  equal, round, and reactive to light.  Cardiovascular:     Rate and Rhythm: Normal rate and regular rhythm.     Pulses: Normal pulses.     Heart sounds: Normal heart sounds. No murmur heard. Pulmonary:     Effort: Pulmonary effort is normal. No respiratory distress.     Breath sounds: Normal breath sounds. No wheezing, rhonchi or rales.  Abdominal:     General: Bowel sounds are normal.     Palpations: Abdomen is soft. There is no mass.     Tenderness: There is no abdominal tenderness. There is no guarding.     Hernia: No hernia is present.  Musculoskeletal:        General: Normal range of motion.     Cervical back: Normal range of motion  and neck supple.  Skin:    General: Skin is warm and dry.  Neurological:     Mental Status: He is alert and oriented to person, place, and time.  Psychiatric:        Behavior: Behavior normal.   BP 116/68   Pulse 89   Temp (!) 97.5 F (36.4 C)   Resp 16   Wt 184 lb 9.6 oz (83.7 kg)   SpO2 91%   BMI 28.07 kg/m  Wt Readings from Last 3 Encounters:  11/12/21 184 lb 9.6 oz (83.7 kg)  09/21/21 181 lb (82.1 kg)  08/18/21 180 lb (81.6 kg)     Lab Results  Component Value Date   WBC 8.3 08/18/2021   HGB 13.6 08/18/2021   HCT 41.3 08/18/2021   PLT 192.0 08/18/2021   GLUCOSE 87 08/18/2021   CHOL 130 03/24/2021   TRIG 94.0 03/24/2021   HDL 37.20 (L) 03/24/2021   LDLCALC 74 03/24/2021   ALT 13 08/18/2021   AST 17 08/18/2021   NA 142 08/18/2021   K 4.5 08/18/2021   CL 108 08/18/2021   CREATININE 1.12 08/18/2021   BUN 9 08/18/2021   CO2 25 08/18/2021   TSH 7.836 (H) 07/31/2021   PSA 0.98 09/22/2018   INR 1.3 (H) 07/31/2021    VAS [place] AAA DUPLEX  Result Date: 09/17/2021 ABDOMINAL AORTA STUDY Patient Name:  DERRION TRITZ  Date of Exam:   09/17/2021 Medical Rec #: [id number]      Accession #:    [id number] Date of Birth: Mar 05, 1946      Patient Gender: M Patient Age:   33 years Exam Location:  [facility] Procedure:      VAS [place] AAA DUPLEX  Referring Phys: Aaron Edelman CRENSHAW --------------------------------------------------------------------------------  Indications: Follow up exam for known AAA. Patient indicates he has had              occasional abdominal pain with nausea but denies back pain. Risk Factors: Hypertension, hyperlipidemia, past history of smoking, coronary               artery disease, prior CVA. Limitations: Obesity.  Comparison Study: Prior aorta duplex exam on 09/17/2020 showed a mid fusiform AAA                   measuring 4.5 x 3.5 cm. Performing Technologist: Salvadore Dom RVT, RDCS (AE), RDMS  Examination Guidelines: A complete evaluation includes B-mode imaging, spectral Doppler, color Doppler, and power Doppler as needed of all accessible portions of each vessel. Bilateral testing is considered an integral part of a complete examination. Limited examinations for reoccurring indications may be performed as noted.  Abdominal Aorta Findings: +-------------+-------+----------+----------+----------+--------+--------+ Location     AP (cm)Trans (cm)PSV (cm/s)Waveform  ThrombusComments +-------------+-------+----------+----------+----------+--------+--------+ Proximal     2.30   2.10      81        biphasic                   +-------------+-------+----------+----------+----------+--------+--------+ Mid          4.70   5.10      59        monophasicPresent fusiform +-------------+-------+----------+----------+----------+--------+--------+ Distal       4.30   4.30      42        monophasic        ectatic  +-------------+-------+----------+----------+----------+--------+--------+ RT CIA Prox  1.3    1.3       97  biphasic          ectatic  +-------------+-------+----------+----------+----------+--------+--------+ RT EIA Prox  0.9    1.0       108       triphasic                  +-------------+-------+----------+----------+----------+--------+--------+ RT EIA Distal                  154       triphasic                  +-------------+-------+----------+----------+----------+--------+--------+ LT CIA Prox  1.2    1.2       77        biphasic          ectatic  +-------------+-------+----------+----------+----------+--------+--------+ LT EIA Prox  1.0    1.2       151       triphasic                  +-------------+-------+----------+----------+----------+--------+--------+ LT EIA Mid                    197       triphasic                  +-------------+-------+----------+----------+----------+--------+--------+ LT EIA Distal                 110       triphasic                  +-------------+-------+----------+----------+----------+--------+--------+ IVC/Iliac Findings: +--------+------+--------+--------+   IVC   PatentThrombusComments +--------+------+--------+--------+ IVC Proxpatent                 +--------+------+--------+--------+    Findings reported to Dr. Jacalyn Lefevre email through [facility] at 9:00 am . Summary: Abdominal Aorta: There is evidence of abnormal dilatation of the mid and distal Abdominal aorta. The largest aortic measurement is 5.1 cm. Atherosclerosis is noted throughout aorta and bilateral iliac arteries. The largest aortic diameter has increased compared to prior exam. Previous diameter measurement was 4.5 cm obtained on 09/17/2020. Stenosis: Left mid EIA has elevated velocities otherwise no stenosis seen. IVC/Iliac: There is no evidence of thrombus involving the IVC.  *See table(s) above for measurements and observations. Vascular consult recommended.  Electronically signed by Kathlyn Sacramento MD on 09/17/2021 at 1:57:51 PM.    Final      Assessment & Plan:  Plan    No orders of the defined types were placed in this encounter.   Problem List Items Addressed This Visit     CML (chronic myelocytic leukemia) ([place]) (Chronic)   Hyperlipidemia, mixed    Tolerating statin, encouraged heart healthy diet, avoid trans fats,  minimize simple carbs and saturated fats. Increase exercise as tolerated      Relevant Orders   Lipid panel   H/O tobacco use, presenting hazards to health    Continues to abstain      IBS (irritable bowel syndrome)    Does intermittently have abdominal discomfort but it is not consistent. No bloody or tarry stool and bowels continue to move. He thinks it started when he stopped his probiotic so he will restart and add Benefiber he will report if does not improve and we will refer to gastroenterology for further evaluation      Abdominal aortic aneurysm    Following with cardiology and vascular.       Tachy-brady syndrome (HCC)   Hypertension  Relevant Orders   CBC   Comprehensive metabolic panel   Lipid panel   TSH   Coronary artery disease   Vitamin B12 deficiency    Supplement and monitor      Relevant Orders   Vitamin B12   Elevated lipase    Hydrate and monitor      Relevant Orders   Lipase   Low magnesium level    Supplement and monitor      Relevant Orders   Magnesium   Pulmonary nodule less than 1 cm in diameter with moderate to high risk for malignant neoplasm    And a second opacity that further evaluation was recommended for so CT is ordered. He is noting persistent DOE      Other Visit Diagnoses     Need for influenza vaccination    -  Primary   Relevant Orders   Flu Vaccine QUAD High Dose(Fluad) (Completed)   Lung nodule       Relevant Orders   CT Chest W Contrast   Opacity of lung on imaging study       Relevant Orders   CT Chest W Contrast   Hypocalcemia       Relevant Orders   VITAMIN D 25 Hydroxy (Vit-D Deficiency, Fractures)       Follow-up: Return in about 4 months (around 03/12/2022) for cpe, annual exam.  I, Suezanne Jacquet, acting as a scribe for Penni Homans, MD, have documented all relevent documentation on behalf of Penni Homans, MD, as directed by Penni Homans, MD while in the presence of Penni Homans, MD. DO:11/12/21.  I,  Mosie Lukes, MD personally performed the services described in this documentation. All medical record entries made by the scribe were at my direction and in my presence. I have reviewed the chart and agree that the record reflects my personal performance and is accurate and complete

## 2021-11-12 NOTE — Assessment & Plan Note (Signed)
Does intermittently have abdominal discomfort but it is not consistent. No bloody or tarry stool and bowels continue to move. He thinks it started when he stopped his probiotic so he will restart and add Benefiber he will report if does not improve and we will refer to gastroenterology for further evaluation

## 2021-11-12 NOTE — Assessment & Plan Note (Signed)
Following with onccology

## 2021-11-12 NOTE — Assessment & Plan Note (Signed)
Hydrate and monitor 

## 2021-11-17 ENCOUNTER — Other Ambulatory Visit: Payer: Self-pay

## 2021-11-17 DIAGNOSIS — E782 Mixed hyperlipidemia: Secondary | ICD-10-CM

## 2021-11-17 DIAGNOSIS — E039 Hypothyroidism, unspecified: Secondary | ICD-10-CM

## 2021-11-17 DIAGNOSIS — R748 Abnormal levels of other serum enzymes: Secondary | ICD-10-CM

## 2021-11-17 DIAGNOSIS — R790 Abnormal level of blood mineral: Secondary | ICD-10-CM

## 2021-11-17 MED ORDER — MAGNESIUM OXIDE 400 MG PO TABS: 400.0000 mg | ORAL_TABLET | Freq: Every day | ORAL | 2 refills | Status: AC

## 2021-11-17 MED ORDER — LEVOTHYROXINE SODIUM 25 MCG PO TABS: 25.0000 ug | ORAL_TABLET | Freq: Every day | ORAL | 2 refills | Status: DC

## 2021-11-17 MED ORDER — VITAMIN D (ERGOCALCIFEROL) 1.25 MG (50000 UNIT) PO CAPS: 50000.0000 [IU] | ORAL_CAPSULE | ORAL | 4 refills | Status: DC

## 2021-11-23 ENCOUNTER — Ambulatory Visit (HOSPITAL_BASED_OUTPATIENT_CLINIC_OR_DEPARTMENT_OTHER)
Admission: RE | Admit: 2021-11-23 | Discharge: 2021-11-23 | Disposition: A | Discharge status: Home / Self Care | Payer: Medicare Other | Source: Ambulatory Visit | Attending: Family Medicine | Admitting: Family Medicine

## 2021-11-23 ENCOUNTER — Encounter (HOSPITAL_BASED_OUTPATIENT_CLINIC_OR_DEPARTMENT_OTHER): Payer: Self-pay

## 2021-11-23 ENCOUNTER — Other Ambulatory Visit: Payer: Self-pay

## 2021-11-23 DIAGNOSIS — R911 Solitary pulmonary nodule: Secondary | ICD-10-CM

## 2021-11-23 DIAGNOSIS — R918 Other nonspecific abnormal finding of lung field: Secondary | ICD-10-CM

## 2021-12-10 ENCOUNTER — Ambulatory Visit (HOSPITAL_BASED_OUTPATIENT_CLINIC_OR_DEPARTMENT_OTHER): Admission: RE | Admit: 2021-12-10 | Payer: Medicare Other | Source: Ambulatory Visit

## 2021-12-15 ENCOUNTER — Inpatient Hospital Stay (HOSPITAL_BASED_OUTPATIENT_CLINIC_OR_DEPARTMENT_OTHER)
Admission: EM | Admit: 2021-12-15 | Discharge: 2021-12-19 | DRG: 871 | Disposition: A | Discharge status: Home / Self Care | Payer: Medicare Other | Attending: Family Medicine | Admitting: Family Medicine

## 2021-12-15 ENCOUNTER — Encounter (HOSPITAL_BASED_OUTPATIENT_CLINIC_OR_DEPARTMENT_OTHER): Payer: Self-pay | Admitting: Emergency Medicine

## 2021-12-15 ENCOUNTER — Other Ambulatory Visit: Payer: Medicare Other

## 2021-12-15 ENCOUNTER — Other Ambulatory Visit: Payer: Self-pay

## 2021-12-15 ENCOUNTER — Emergency Department (HOSPITAL_BASED_OUTPATIENT_CLINIC_OR_DEPARTMENT_OTHER): Payer: Medicare Other

## 2021-12-15 DIAGNOSIS — R911 Solitary pulmonary nodule: Secondary | ICD-10-CM | POA: Diagnosis present

## 2021-12-15 DIAGNOSIS — Z955 Presence of coronary angioplasty implant and graft: Secondary | ICD-10-CM | POA: Diagnosis not present

## 2021-12-15 DIAGNOSIS — C9210 Chronic myeloid leukemia, BCR/ABL-positive, not having achieved remission: Secondary | ICD-10-CM | POA: Diagnosis present

## 2021-12-15 DIAGNOSIS — Z950 Presence of cardiac pacemaker: Secondary | ICD-10-CM | POA: Diagnosis not present

## 2021-12-15 DIAGNOSIS — R6521 Severe sepsis with septic shock: Secondary | ICD-10-CM | POA: Diagnosis present

## 2021-12-15 DIAGNOSIS — N1832 Chronic kidney disease, stage 3b: Secondary | ICD-10-CM

## 2021-12-15 DIAGNOSIS — Z8249 Family history of ischemic heart disease and other diseases of the circulatory system: Secondary | ICD-10-CM | POA: Diagnosis not present

## 2021-12-15 DIAGNOSIS — E872 Acidosis, unspecified: Secondary | ICD-10-CM | POA: Diagnosis present

## 2021-12-15 DIAGNOSIS — J9601 Acute respiratory failure with hypoxia: Secondary | ICD-10-CM | POA: Diagnosis present

## 2021-12-15 DIAGNOSIS — E039 Hypothyroidism, unspecified: Secondary | ICD-10-CM | POA: Diagnosis present

## 2021-12-15 DIAGNOSIS — Z9189 Other specified personal risk factors, not elsewhere classified: Secondary | ICD-10-CM

## 2021-12-15 DIAGNOSIS — I495 Sick sinus syndrome: Secondary | ICD-10-CM | POA: Diagnosis present

## 2021-12-15 DIAGNOSIS — I248 Other forms of acute ischemic heart disease: Secondary | ICD-10-CM | POA: Diagnosis present

## 2021-12-15 DIAGNOSIS — Z79899 Other long term (current) drug therapy: Secondary | ICD-10-CM

## 2021-12-15 DIAGNOSIS — R6520 Severe sepsis without septic shock: Secondary | ICD-10-CM | POA: Diagnosis present

## 2021-12-15 DIAGNOSIS — A4189 Other specified sepsis: Principal | ICD-10-CM | POA: Diagnosis present

## 2021-12-15 DIAGNOSIS — I480 Paroxysmal atrial fibrillation: Secondary | ICD-10-CM | POA: Diagnosis present

## 2021-12-15 DIAGNOSIS — I2510 Atherosclerotic heart disease of native coronary artery without angina pectoris: Secondary | ICD-10-CM | POA: Diagnosis present

## 2021-12-15 DIAGNOSIS — I129 Hypertensive chronic kidney disease with stage 1 through stage 4 chronic kidney disease, or unspecified chronic kidney disease: Secondary | ICD-10-CM | POA: Diagnosis present

## 2021-12-15 DIAGNOSIS — Z806 Family history of leukemia: Secondary | ICD-10-CM

## 2021-12-15 DIAGNOSIS — U071 COVID-19: Secondary | ICD-10-CM

## 2021-12-15 DIAGNOSIS — Z7901 Long term (current) use of anticoagulants: Secondary | ICD-10-CM | POA: Diagnosis not present

## 2021-12-15 DIAGNOSIS — A419 Sepsis, unspecified organism: Secondary | ICD-10-CM | POA: Diagnosis present

## 2021-12-15 DIAGNOSIS — Z79890 Hormone replacement therapy (postmenopausal): Secondary | ICD-10-CM

## 2021-12-15 DIAGNOSIS — Z87891 Personal history of nicotine dependence: Secondary | ICD-10-CM

## 2021-12-15 DIAGNOSIS — I4891 Unspecified atrial fibrillation: Secondary | ICD-10-CM | POA: Diagnosis not present

## 2021-12-15 DIAGNOSIS — E782 Mixed hyperlipidemia: Secondary | ICD-10-CM | POA: Diagnosis present

## 2021-12-15 DIAGNOSIS — K589 Irritable bowel syndrome without diarrhea: Secondary | ICD-10-CM | POA: Diagnosis present

## 2021-12-15 DIAGNOSIS — K219 Gastro-esophageal reflux disease without esophagitis: Secondary | ICD-10-CM | POA: Diagnosis present

## 2021-12-15 DIAGNOSIS — I7143 Infrarenal abdominal aortic aneurysm, without rupture: Secondary | ICD-10-CM | POA: Diagnosis present

## 2021-12-15 DIAGNOSIS — I714 Abdominal aortic aneurysm, without rupture, unspecified: Secondary | ICD-10-CM | POA: Diagnosis not present

## 2021-12-15 DIAGNOSIS — Z8673 Personal history of transient ischemic attack (TIA), and cerebral infarction without residual deficits: Secondary | ICD-10-CM | POA: Diagnosis not present

## 2021-12-15 DIAGNOSIS — E876 Hypokalemia: Secondary | ICD-10-CM | POA: Diagnosis present

## 2021-12-15 DIAGNOSIS — N1831 Chronic kidney disease, stage 3a: Secondary | ICD-10-CM | POA: Diagnosis present

## 2021-12-15 DIAGNOSIS — I10 Essential (primary) hypertension: Secondary | ICD-10-CM | POA: Diagnosis present

## 2021-12-15 DIAGNOSIS — D6859 Other primary thrombophilia: Secondary | ICD-10-CM | POA: Diagnosis present

## 2021-12-15 LAB — CBC WITH DIFFERENTIAL/PLATELET
Abs Immature Granulocytes: 0.09 10*3/uL — ABNORMAL HIGH (ref 0.00–0.07)
Basophils Absolute: 0.1 10*3/uL (ref 0.0–0.1)
Basophils Relative: 1 %
Eosinophils Absolute: 0 10*3/uL (ref 0.0–0.5)
Eosinophils Relative: 0 %
HCT: 47.3 % (ref 39.0–52.0)
Hemoglobin: 16 g/dL (ref 13.0–17.0)
Immature Granulocytes: 1 %
Lymphocytes Relative: 9 %
Lymphs Abs: 1.1 10*3/uL (ref 0.7–4.0)
MCH: 31.4 pg (ref 26.0–34.0)
MCHC: 33.8 g/dL (ref 30.0–36.0)
MCV: 92.7 fL (ref 80.0–100.0)
Monocytes Absolute: 0.8 10*3/uL (ref 0.1–1.0)
Monocytes Relative: 7 %
Neutro Abs: 10 10*3/uL — ABNORMAL HIGH (ref 1.7–7.7)
Neutrophils Relative %: 82 %
Platelets: 223 10*3/uL (ref 150–400)
RBC: 5.1 MIL/uL (ref 4.22–5.81)
RDW: 14.3 % (ref 11.5–15.5)
WBC: 12.1 10*3/uL — ABNORMAL HIGH (ref 4.0–10.5)
nRBC: 0 % (ref 0.0–0.2)

## 2021-12-15 LAB — COMPREHENSIVE METABOLIC PANEL
ALT: 13 U/L (ref 0–44)
AST: 36 U/L (ref 15–41)
Albumin: 3.6 g/dL (ref 3.5–5.0)
Alkaline Phosphatase: 90 U/L (ref 38–126)
Anion gap: 19 — ABNORMAL HIGH (ref 5–15)
BUN: 21 mg/dL (ref 8–23)
CO2: 20 mmol/L — ABNORMAL LOW (ref 22–32)
Calcium: 8.8 mg/dL — ABNORMAL LOW (ref 8.9–10.3)
Chloride: 103 mmol/L (ref 98–111)
Creatinine, Ser: 1.35 mg/dL — ABNORMAL HIGH (ref 0.61–1.24)
GFR, Estimated: 55 mL/min — ABNORMAL LOW (ref >60)
Glucose, Bld: 100 mg/dL — ABNORMAL HIGH (ref 70–99)
Potassium: 3 mmol/L — ABNORMAL LOW (ref 3.5–5.1)
Sodium: 142 mmol/L (ref 135–145)
Total Bilirubin: 1 mg/dL (ref 0.3–1.2)
Total Protein: 7.6 g/dL (ref 6.5–8.1)

## 2021-12-15 LAB — LACTIC ACID, PLASMA
Lactic Acid, Venous: 6.3 mmol/L (ref 0.5–1.9)
Lactic Acid, Venous: 4.2 mmol/L (ref 0.5–1.9)
Lactic Acid, Venous: 2.2 mmol/L (ref 0.5–1.9)

## 2021-12-15 LAB — TROPONIN I (HIGH SENSITIVITY)
Troponin I (High Sensitivity): 59 ng/L — ABNORMAL HIGH (ref <18)
Troponin I (High Sensitivity): 58 ng/L — ABNORMAL HIGH (ref <18)

## 2021-12-15 LAB — TSH: TSH: 13.891 u[IU]/mL — ABNORMAL HIGH (ref 0.350–4.500)

## 2021-12-15 LAB — RESP PANEL BY RT-PCR (FLU A&B, COVID) ARPGX2
Influenza A by PCR: NEGATIVE
Influenza B by PCR: NEGATIVE
SARS Coronavirus 2 by RT PCR: POSITIVE — AB

## 2021-12-15 LAB — MAGNESIUM: Magnesium: 1.4 mg/dL — ABNORMAL LOW (ref 1.7–2.4)

## 2021-12-15 LAB — C-REACTIVE PROTEIN: CRP: 4.4 mg/dL — ABNORMAL HIGH (ref <1.0)

## 2021-12-15 LAB — PROCALCITONIN: Procalcitonin: 0.12 ng/mL

## 2021-12-15 LAB — LIPASE, BLOOD: Lipase: 268 U/L — ABNORMAL HIGH (ref 11–51)

## 2021-12-15 LAB — D-DIMER, QUANTITATIVE: D-Dimer, Quant: 2.2 ug/mL-FEU — ABNORMAL HIGH (ref 0.00–0.50)

## 2021-12-15 LAB — FIBRINOGEN: Fibrinogen: 313 mg/dL (ref 210–475)

## 2021-12-15 LAB — FERRITIN: Ferritin: 490 ng/mL — ABNORMAL HIGH (ref 24–336)

## 2021-12-15 LAB — MRSA NEXT GEN BY PCR, NASAL: MRSA by PCR Next Gen: NOT DETECTED

## 2021-12-15 LAB — BRAIN NATRIURETIC PEPTIDE: B Natriuretic Peptide: 386.3 pg/mL — ABNORMAL HIGH (ref 0.0–100.0)

## 2021-12-15 MED ORDER — HYDROCODONE-ACETAMINOPHEN 5-325 MG PO TABS: 1.0000 | ORAL_TABLET | ORAL | Status: DC | PRN

## 2021-12-15 MED ORDER — SODIUM CHLORIDE 0.9% FLUSH: 3.0000 mL | Freq: Two times a day (BID) | INTRAVENOUS | Status: DC

## 2021-12-15 MED ORDER — ONDANSETRON HCL 4 MG PO TABS: 4.0000 mg | ORAL_TABLET | Freq: Four times a day (QID) | ORAL | Status: DC | PRN

## 2021-12-15 MED ORDER — ONDANSETRON HCL 4 MG/2ML IJ SOLN: 4.0000 mg | Freq: Four times a day (QID) | INTRAMUSCULAR | Status: DC | PRN

## 2021-12-15 MED ORDER — LEVOTHYROXINE SODIUM 75 MCG PO TABS
75.0000 ug | ORAL_TABLET | Freq: Every day | ORAL | Status: DC
  Administered 2021-12-16 – 2021-12-19 (×4): 75 ug via ORAL
  Filled 2021-12-15 (×4): qty 1

## 2021-12-15 MED ORDER — ATORVASTATIN CALCIUM 40 MG PO TABS
40.0000 mg | ORAL_TABLET | Freq: Every day | ORAL | Status: DC
  Administered 2021-12-15 – 2021-12-19 (×5): 40 mg via ORAL
  Filled 2021-12-15 (×5): qty 1

## 2021-12-15 MED ORDER — IOHEXOL 300 MG/ML  SOLN
100.0000 mL | Freq: Once | INTRAMUSCULAR | Status: AC | PRN
  Administered 2021-12-15: 12:00:00 100 mL via INTRAVENOUS

## 2021-12-15 MED ORDER — ASCORBIC ACID 500 MG PO TABS
500.0000 mg | ORAL_TABLET | Freq: Every day | ORAL | Status: DC
  Administered 2021-12-15 – 2021-12-18 (×4): 500 mg via ORAL
  Filled 2021-12-15 (×4): qty 1

## 2021-12-15 MED ORDER — METOPROLOL SUCCINATE ER 50 MG PO TB24
100.0000 mg | ORAL_TABLET | Freq: Every day | ORAL | Status: DC
  Administered 2021-12-15 – 2021-12-19 (×5): 100 mg via ORAL
  Filled 2021-12-15: qty 2
  Filled 2021-12-15: qty 4
  Filled 2021-12-15: qty 2
  Filled 2021-12-15 (×2): qty 4

## 2021-12-15 MED ORDER — CHLORHEXIDINE GLUCONATE CLOTH 2 % EX PADS
6.0000 | MEDICATED_PAD | Freq: Every day | CUTANEOUS | Status: DC
  Administered 2021-12-15 – 2021-12-17 (×3): 6 via TOPICAL

## 2021-12-15 MED ORDER — VANCOMYCIN HCL IN DEXTROSE 1-5 GM/200ML-% IV SOLN: 1000.0000 mg | INTRAVENOUS | Status: DC

## 2021-12-15 MED ORDER — SODIUM CHLORIDE 0.9 % IV BOLUS
1000.0000 mL | Freq: Once | INTRAVENOUS | Status: AC
  Administered 2021-12-15: 13:00:00 1000 mL via INTRAVENOUS

## 2021-12-15 MED ORDER — VANCOMYCIN HCL 1500 MG/300ML IV SOLN
1500.0000 mg | INTRAVENOUS | Status: DC
  Filled 2021-12-15: qty 300

## 2021-12-15 MED ORDER — LEVOTHYROXINE SODIUM 25 MCG PO TABS: 25.0000 ug | ORAL_TABLET | Freq: Every day | ORAL | Status: DC

## 2021-12-15 MED ORDER — METHYLPREDNISOLONE SODIUM SUCC 125 MG IJ SOLR
1.0000 mg/kg | Freq: Two times a day (BID) | INTRAMUSCULAR | Status: AC
  Administered 2021-12-15 – 2021-12-18 (×6): 70.625 mg via INTRAVENOUS
  Filled 2021-12-15 (×6): qty 2

## 2021-12-15 MED ORDER — FERROUS SULFATE 325 (65 FE) MG PO TABS
325.0000 mg | ORAL_TABLET | Freq: Two times a day (BID) | ORAL | Status: DC
  Administered 2021-12-16 – 2021-12-19 (×7): 325 mg via ORAL
  Filled 2021-12-15 (×7): qty 1

## 2021-12-15 MED ORDER — SODIUM CHLORIDE 0.9% FLUSH: 3.0000 mL | INTRAVENOUS | Status: DC | PRN

## 2021-12-15 MED ORDER — AMIODARONE LOAD VIA INFUSION
150.0000 mg | Freq: Once | INTRAVENOUS | Status: AC
  Administered 2021-12-15: 11:00:00 150 mg via INTRAVENOUS
  Filled 2021-12-15: qty 83.34

## 2021-12-15 MED ORDER — ZINC SULFATE 220 (50 ZN) MG PO CAPS
220.0000 mg | ORAL_CAPSULE | Freq: Every day | ORAL | Status: DC
  Administered 2021-12-15 – 2021-12-18 (×4): 220 mg via ORAL
  Filled 2021-12-15 (×6): qty 1

## 2021-12-15 MED ORDER — DICYCLOMINE HCL 20 MG PO TABS: 20.0000 mg | ORAL_TABLET | Freq: Two times a day (BID) | ORAL | Status: DC

## 2021-12-15 MED ORDER — METOPROLOL TARTRATE 5 MG/5ML IV SOLN: 10.0000 mg | Freq: Three times a day (TID) | INTRAVENOUS | Status: DC

## 2021-12-15 MED ORDER — SODIUM CHLORIDE 0.9 % IV SOLN
250.0000 mL | INTRAVENOUS | Status: DC | PRN
  Administered 2021-12-17: 08:00:00 250 mL via INTRAVENOUS

## 2021-12-15 MED ORDER — LOSARTAN POTASSIUM 50 MG PO TABS
100.0000 mg | ORAL_TABLET | Freq: Every day | ORAL | Status: DC
  Administered 2021-12-15 – 2021-12-19 (×5): 100 mg via ORAL
  Filled 2021-12-15 (×5): qty 2

## 2021-12-15 MED ORDER — MAGNESIUM SULFATE 2 GM/50ML IV SOLN
2.0000 g | Freq: Once | INTRAVENOUS | Status: AC
  Administered 2021-12-15: 14:00:00 2 g via INTRAVENOUS
  Filled 2021-12-15: qty 50

## 2021-12-15 MED ORDER — SODIUM CHLORIDE 0.9% FLUSH
3.0000 mL | Freq: Two times a day (BID) | INTRAVENOUS | Status: DC
  Administered 2021-12-15 – 2021-12-19 (×7): 3 mL via INTRAVENOUS

## 2021-12-15 MED ORDER — ACETAMINOPHEN 325 MG PO TABS: 650.0000 mg | ORAL_TABLET | Freq: Four times a day (QID) | ORAL | Status: DC | PRN

## 2021-12-15 MED ORDER — VANCOMYCIN HCL 1250 MG/250ML IV SOLN
1250.0000 mg | INTRAVENOUS | Status: DC
Start: 2021-12-16 — End: 2021-12-15
  Filled 2021-12-15: qty 250

## 2021-12-15 MED ORDER — SODIUM CHLORIDE 0.9 % IV SOLN
2.0000 g | Freq: Once | INTRAVENOUS | Status: AC
  Administered 2021-12-15: 13:00:00 2 g via INTRAVENOUS
  Filled 2021-12-15: qty 20

## 2021-12-15 MED ORDER — VANCOMYCIN HCL IN DEXTROSE 1-5 GM/200ML-% IV SOLN
1000.0000 mg | INTRAVENOUS | Status: AC
  Administered 2021-12-15: 14:00:00 1000 mg via INTRAVENOUS

## 2021-12-15 MED ORDER — POTASSIUM CHLORIDE CRYS ER 20 MEQ PO TBCR
40.0000 meq | EXTENDED_RELEASE_TABLET | Freq: Once | ORAL | Status: AC
  Administered 2021-12-15: 18:00:00 40 meq via ORAL
  Filled 2021-12-15: qty 2

## 2021-12-15 MED ORDER — LEVOTHYROXINE SODIUM 50 MCG PO TABS: 50.0000 ug | ORAL_TABLET | Freq: Every day | ORAL | Status: DC

## 2021-12-15 MED ORDER — SODIUM CHLORIDE 0.9 % IV SOLN
100.0000 mg | Freq: Every day | INTRAVENOUS | Status: AC
  Administered 2021-12-16 – 2021-12-19 (×4): 100 mg via INTRAVENOUS
  Filled 2021-12-15 (×4): qty 20

## 2021-12-15 MED ORDER — SODIUM CHLORIDE 0.9 % IV BOLUS
500.0000 mL | Freq: Once | INTRAVENOUS | Status: AC
  Administered 2021-12-15: 11:00:00 500 mL via INTRAVENOUS

## 2021-12-15 MED ORDER — ORAL CARE MOUTH RINSE
15.0000 mL | Freq: Two times a day (BID) | OROMUCOSAL | Status: DC
  Administered 2021-12-15 – 2021-12-19 (×7): 15 mL via OROMUCOSAL

## 2021-12-15 MED ORDER — LACTATED RINGERS IV SOLN: INTRAVENOUS | Status: DC

## 2021-12-15 MED ORDER — DEXAMETHASONE SODIUM PHOSPHATE 10 MG/ML IJ SOLN
6.0000 mg | INTRAMUSCULAR | Status: DC
  Filled 2021-12-15: qty 1

## 2021-12-15 MED ORDER — ACETAMINOPHEN 650 MG RE SUPP: 650.0000 mg | Freq: Four times a day (QID) | RECTAL | Status: DC | PRN

## 2021-12-15 MED ORDER — AMIODARONE HCL IN DEXTROSE 360-4.14 MG/200ML-% IV SOLN
60.0000 mg/h | INTRAVENOUS | Status: AC
  Administered 2021-12-15: 11:00:00 60 mg/h via INTRAVENOUS
  Filled 2021-12-15 (×2): qty 200

## 2021-12-15 MED ORDER — IMATINIB MESYLATE 400 MG PO TABS: 400.0000 mg | ORAL_TABLET | Freq: Every day | ORAL | Status: DC

## 2021-12-15 MED ORDER — AMLODIPINE BESYLATE 5 MG PO TABS
5.0000 mg | ORAL_TABLET | Freq: Every day | ORAL | Status: DC
  Administered 2021-12-15 – 2021-12-19 (×5): 5 mg via ORAL
  Filled 2021-12-15 (×5): qty 1

## 2021-12-15 MED ORDER — SODIUM CHLORIDE 0.9 % IV SOLN: 100.0000 mg | Freq: Every day | INTRAVENOUS | Status: DC

## 2021-12-15 MED ORDER — VANCOMYCIN HCL 1500 MG/300ML IV SOLN: 1500.0000 mg | Freq: Once | INTRAVENOUS | Status: DC

## 2021-12-15 MED ORDER — VANCOMYCIN HCL IN DEXTROSE 1-5 GM/200ML-% IV SOLN
1000.0000 mg | Freq: Once | INTRAVENOUS | Status: DC
  Filled 2021-12-15: qty 200

## 2021-12-15 MED ORDER — LOSARTAN POTASSIUM 50 MG PO TABS: 100.0000 mg | ORAL_TABLET | Freq: Every day | ORAL | Status: DC

## 2021-12-15 MED ORDER — AMIODARONE HCL IN DEXTROSE 360-4.14 MG/200ML-% IV SOLN
30.0000 mg/h | INTRAVENOUS | Status: DC
  Filled 2021-12-15 (×3): qty 200

## 2021-12-15 MED ORDER — SODIUM CHLORIDE 0.9 % IV SOLN: 200.0000 mg | Freq: Once | INTRAVENOUS | Status: DC

## 2021-12-15 MED ORDER — SODIUM CHLORIDE 0.9 % IV SOLN
100.0000 mg | INTRAVENOUS | Status: AC
  Administered 2021-12-15 (×2): 100 mg via INTRAVENOUS

## 2021-12-15 MED ORDER — POTASSIUM CHLORIDE CRYS ER 20 MEQ PO TBCR
40.0000 meq | EXTENDED_RELEASE_TABLET | Freq: Once | ORAL | Status: AC
  Administered 2021-12-15: 13:00:00 40 meq via ORAL
  Filled 2021-12-15: qty 2

## 2021-12-15 MED ORDER — PANTOPRAZOLE SODIUM 40 MG PO TBEC
80.0000 mg | DELAYED_RELEASE_TABLET | Freq: Every day | ORAL | Status: DC
  Administered 2021-12-15 – 2021-12-19 (×5): 80 mg via ORAL
  Filled 2021-12-15 (×5): qty 2

## 2021-12-15 MED ORDER — METOPROLOL TARTRATE 5 MG/5ML IV SOLN
2.5000 mg | INTRAVENOUS | Status: DC | PRN
  Administered 2021-12-15: 18:00:00 2.5 mg via INTRAVENOUS
  Filled 2021-12-15: qty 5

## 2021-12-15 MED ORDER — RIVAROXABAN 20 MG PO TABS
20.0000 mg | ORAL_TABLET | Freq: Every day | ORAL | Status: DC
  Administered 2021-12-15 – 2021-12-18 (×4): 20 mg via ORAL
  Filled 2021-12-15 (×4): qty 1

## 2021-12-15 MED ORDER — FERROUS SULFATE 325 (65 FE) MG PO TBEC: 325.0000 mg | DELAYED_RELEASE_TABLET | Freq: Two times a day (BID) | ORAL | Status: DC

## 2021-12-15 MED ORDER — METOPROLOL SUCCINATE ER 25 MG PO TB24: 100.0000 mg | ORAL_TABLET | Freq: Every day | ORAL | Status: DC

## 2021-12-15 MED ORDER — ADULT MULTIVITAMIN W/MINERALS CH
1.0000 | ORAL_TABLET | Freq: Every day | ORAL | Status: DC
  Administered 2021-12-15 – 2021-12-19 (×5): 1 via ORAL
  Filled 2021-12-15 (×5): qty 1

## 2021-12-15 MED ORDER — PREDNISONE 50 MG PO TABS
50.0000 mg | ORAL_TABLET | Freq: Every day | ORAL | Status: DC
  Administered 2021-12-18 – 2021-12-19 (×2): 50 mg via ORAL
  Filled 2021-12-15 (×2): qty 1

## 2021-12-15 NOTE — ED Notes (Signed)
Report given to [facility] with [facility]

## 2021-12-15 NOTE — ED Notes (Signed)
Pt placed on 6L o2 

## 2021-12-15 NOTE — ED Triage Notes (Signed)
Pt arrives pov with c/o abdominal pain, decreased po intake x 1 week, and shob

## 2021-12-15 NOTE — ED Notes (Signed)
Vancomycin 1500mg  not available. Spoke with Pharmacist at [place] will be changed back to 1000 mg

## 2021-12-15 NOTE — Progress Notes (Signed)
Pharmacy Antibiotic Note  Jason [name] is a 75 y.o. male admitted on 12/15/2021 with pneumonia.  Pharmacy has been consulted for vancomycin dosing.  Plan: Vancomycin 1250mg  IV every 24 hours.  Goal - AUC ~ 500 mcg*h/mL  Height: 5\' 8"  (172.7 cm) Weight: 71.7 kg (158 lb) IBW/kg (Calculated) : 68.4  Temp (24hrs), Avg:98 F (36.7 C), Min:97 F (36.1 C), Max:99 F (37.2 C)  Recent Labs  Lab 12/15/21 1050 12/15/21 1242  WBC 12.1*  --   CREATININE 1.35*  --   LATICACIDVEN 6.3* 4.2*    Estimated Creatinine Clearance: 47.1 mL/min (A) (by C-G formula based on SCr of 1.35 mg/dL (H)).    No Known Allergies  Antimicrobials this admission: Remdisivir 100 mg IV q 30 min x 2 doses Vancomycin 1 gm IV once    Microbiology results: Covid positive, others pending  Thank you for allowing pharmacy to be a part of this patients care.  Mallie Mussel A Zong Mcquarrie 12/15/2021 3:21 PM

## 2021-12-15 NOTE — ED Notes (Signed)
SAT 87% on RA. Attempted [place], but patient is a mouth breather and could not get above 89%. MD at bedside

## 2021-12-15 NOTE — ED Provider Notes (Signed)
Emergency Department Provider Note   I have reviewed the triage vital signs and the nursing notes.   HISTORY  Chief Complaint Shortness of Breath   HPI Jason [name] is a 75 y.o. male with past medical history reviewed below including paroxysmal A. fib on amiodarone, CAD, hypertension, and tacky/bradycardia syndrome with Medtronic pacemaker presents to the emergency department with fatigue and shortness of breath over the past 10 days.  He has decreased appetite with some mild, abdominal soreness in the center of his abdomen.  He has felt short of breath.  He has not felt much like eating but denies severe pain with doing so.  Because of his lack of appetite he has not been taking his medications over the past 2 to 3 days including his Xarelto and amiodarone.  He reports some congestion.  He has not recorded any fevers but states at times he will feel very cold and have to cover up with many blankets. No vomiting or diarrhea.    Past Medical History:  Diagnosis Date   Anemia 05/08/2015   CML (chronic myelocytic leukemia) ([place]) 12/16/2011   Coronary heart disease    CVA (cerebrovascular accident) ([place])    GERD (gastroesophageal reflux disease)    Grief reaction 03/18/2014   H/O tobacco use, presenting hazards to health 02/24/2012   No cigarettes since pacemaker placement. Encouraged ongoing efforts    History of chicken pox    History of kidney stones    Hyperlipidemia    Hypertension    IBS (irritable bowel syndrome) 09/20/2013   Medicare annual wellness visit, subsequent 03/23/2014   PAF (paroxysmal atrial fibrillation) (HCC)    Symptomatic bradycardia    a. symptomatic bradycardia due to sinus node dysfunction s/p RA and RV Medtronic pacemaker 04/15/16    Patient Active Problem List   Diagnosis Date Noted   Stage 3a chronic kidney disease (CKD) ([place]) 12/16/2021   Demand ischemia ([place]) 12/16/2021   Severe sepsis ([place]) 12/15/2021   COVID-19 virus infection 12/15/2021   Acute  hypoxemic respiratory failure ([place]) 12/15/2021   Pulmonary nodule less than 1 cm in diameter with moderate to high risk for malignant neoplasm 08/19/2021   Chronic obstructive pulmonary disease ([place]) 08/19/2021   Abdominal pain 08/19/2021   Elevated lipase 08/18/2021   Hypomagnesemia 08/18/2021   Right knee pain 03/24/2021   Fall 03/24/2021   Hypothyroidism 09/11/2020   Vitamin B12 deficiency 03/09/2020   Renal insufficiency 03/06/2020   Headache 03/26/2019   Symptomatic bradycardia    PAF (paroxysmal atrial fibrillation) ([employer])    History of kidney stones    Hypertension    History of chicken pox    GERD (gastroesophageal reflux disease)    CVA (cerebrovascular accident) ([place])    Sun-damaged skin 09/18/2017   Otitis externa 09/18/2017   Tachy-brady syndrome ([place]) 04/16/2016   Anemia 05/08/2015   Abdominal aortic aneurysm 03/19/2015   Dizziness 10/13/2014   Bruit 08/07/2014   Chest pain with moderate risk of acute coronary syndrome 07/15/2014   Chronic anticoagulation 07/15/2014   History of CVA (cerebrovascular accident) 07/15/2014   Preventative health care 03/23/2014   Medicare annual wellness visit, subsequent 03/23/2014   Grief reaction 03/18/2014   IBS (irritable bowel syndrome) 09/20/2013   Hypokalemia 09/09/2012   Corneal edema 09/08/2012   Pseudophakia 09/08/2012   Atrial fibrillation with RVR ([place]) 09/01/2012   Coronary artery disease 08/31/2012   Leukemia ([place]) 08/31/2012   Nausea 07/06/2012   Mature cataract 06/23/2012   Hyperlipidemia, mixed 02/24/2012  CAD- prior stents 2000 by Dr Elonda Husky in [facility] 02/24/2012   H/O tobacco use, presenting hazards to health 02/24/2012   CML (chronic myelocytic leukemia) ([place]) 12/16/2011    Past Surgical History:  Procedure Laterality Date   CATARACT EXTRACTION     CHOLECYSTECTOMY  2003   CORONARY ANGIOPLASTY WITH STENT PLACEMENT  2000   EP IMPLANTABLE DEVICE N/A 04/15/2016   Procedure: Pacemaker Implant;  Surgeon: Evans Lance, MD; Medtronic (serial number [id number]) pacemaker; Laterality: Left    Allergies Patient has no known allergies.  Family History  Problem Relation Age of Onset   Arthritis Mother    Hypertension Mother    Heart disease Mother        Rheumatic fever   Rheumatic fever Mother    Other Mother        brain tumor   Cancer Mother        leukemia   Hypertension Father    Alcohol abuse Father    Diabetes Son    Colon cancer Neg Hx    Breast cancer Neg Hx    Prostate cancer Neg Hx     Social History Social History   Tobacco Use   Smoking status: Former    Packs/day: 0.25    Years: 40.00    Pack years: 10.00    Types: Cigarettes    Start date: 10/30/1967    Quit date: 04/14/2016    Years since quitting: 5.6   Smokeless tobacco: Never  Vaping Use   Vaping Use: Never used  Substance Use Topics   Alcohol use: No    Alcohol/week: 0.0 standard drinks    Comment: Rare   Drug use: No    Review of Systems  Constitutional: No fever.  Eyes: No visual changes. ENT: No sore throat. Positive congestion.  Cardiovascular: Denies chest pain. Respiratory: Positive shortness of breath. Gastrointestinal: Positive abdominal pain. Mild nausea, no vomiting.  No diarrhea.  No constipation. Positive poor appetite.  Genitourinary: Negative for dysuria. Musculoskeletal: Negative for back pain. Skin: Negative for rash. Neurological: Negative for headaches, focal weakness or numbness.  10-point ROS otherwise negative.  ____________________________________________   PHYSICAL EXAM:  VITAL SIGNS: ED Triage Vitals  Enc Vitals Group     BP --      Pulse Rate 12/15/21 1025 (!) 156     Resp --      Temp --      Temp src --      SpO2 12/15/21 1020 (!) 88 %     Weight 12/15/21 1017 158 lb (71.7 kg)     Height 12/15/21 1017 5\' 8"  (1.727 m)    Constitutional: Alert and oriented. Well appearing and in no acute distress. Able to provide a full history.  Eyes: Conjunctivae are  normal.  Head: Atraumatic. Nose: No congestion/rhinnorhea. Mouth/Throat: Mucous membranes are moist.  Neck: No stridor.  Cardiovascular: Tachycardia. Good peripheral circulation. Grossly normal heart sounds.   Respiratory: Normal respiratory effort.  No retractions. Lungs CTAB. Gastrointestinal: Soft and nontender. No distention.  Musculoskeletal: No lower extremity tenderness nor edema. No gross deformities of extremities. Neurologic:  Normal speech and language. No gross focal neurologic deficits are appreciated.  Skin:  Skin is warm, dry and intact. No rash noted.   ____________________________________________   LABS (all labs ordered are listed, but only abnormal results are displayed)  Labs Reviewed  RESP PANEL BY RT-PCR (FLU A&B, COVID) ARPGX2 - Abnormal; Notable for the following components:      Result  Value   SARS Coronavirus 2 by RT PCR POSITIVE (*)    All other components within normal limits  COMPREHENSIVE METABOLIC PANEL - Abnormal; Notable for the following components:   Potassium 3.0 (*)    CO2 20 (*)    Glucose, Bld 100 (*)    Creatinine, Ser 1.35 (*)    Calcium 8.8 (*)    GFR, Estimated 55 (*)    Anion gap 19 (*)    All other components within normal limits  BRAIN NATRIURETIC PEPTIDE - Abnormal; Notable for the following components:   B Natriuretic Peptide 386.3 (*)    All other components within normal limits  LACTIC ACID, PLASMA - Abnormal; Notable for the following components:   Lactic Acid, Venous 6.3 (*)    All other components within normal limits  LACTIC ACID, PLASMA - Abnormal; Notable for the following components:   Lactic Acid, Venous 4.2 (*)    All other components within normal limits  LIPASE, BLOOD - Abnormal; Notable for the following components:   Lipase 268 (*)    All other components within normal limits  CBC WITH DIFFERENTIAL/PLATELET - Abnormal; Notable for the following components:   WBC 12.1 (*)    Neutro Abs 10.0 (*)    Abs  Immature Granulocytes 0.09 (*)    All other components within normal limits  MAGNESIUM - Abnormal; Notable for the following components:   Magnesium 1.4 (*)    All other components within normal limits  TSH - Abnormal; Notable for the following components:   TSH 13.891 (*)    All other components within normal limits  LACTIC ACID, PLASMA - Abnormal; Notable for the following components:   Lactic Acid, Venous 2.2 (*)    All other components within normal limits  FERRITIN - Abnormal; Notable for the following components:   Ferritin 490 (*)    All other components within normal limits  D-DIMER, QUANTITATIVE - Abnormal; Notable for the following components:   D-Dimer, Quant 2.20 (*)    All other components within normal limits  C-REACTIVE PROTEIN - Abnormal; Notable for the following components:   CRP 4.4 (*)    All other components within normal limits  FERRITIN - Abnormal; Notable for the following components:   Ferritin 410 (*)    All other components within normal limits  D-DIMER, QUANTITATIVE - Abnormal; Notable for the following components:   D-Dimer, Quant 1.21 (*)    All other components within normal limits  C-REACTIVE PROTEIN - Abnormal; Notable for the following components:   CRP 4.5 (*)    All other components within normal limits  COMPREHENSIVE METABOLIC PANEL - Abnormal; Notable for the following components:   Potassium 3.0 (*)    CO2 20 (*)    Glucose, Bld 164 (*)    BUN 24 (*)    Calcium 8.1 (*)    Total Protein 6.1 (*)    Albumin 2.9 (*)    All other components within normal limits  CBC WITH DIFFERENTIAL/PLATELET - Abnormal; Notable for the following components:   RBC 4.16 (*)    Hemoglobin 12.8 (*)    Lymphs Abs 0.4 (*)    All other components within normal limits  TROPONIN I (HIGH SENSITIVITY) - Abnormal; Notable for the following components:   Troponin I (High Sensitivity) 59 (*)    All other components within normal limits  TROPONIN I (HIGH SENSITIVITY)  - Abnormal; Notable for the following components:   Troponin I (High Sensitivity) 58 (*)  All other components within normal limits  MRSA NEXT GEN BY PCR, NASAL  FIBRINOGEN  PROCALCITONIN  PHOSPHORUS  MAGNESIUM   ____________________________________________  EKG   EKG Interpretation  Date/Time:  Tuesday December 15 2021 10:24:56 EST Ventricular Rate:  157 PR Interval:    QRS Duration: 138 QT Interval:  344 QTC Calculation: 556 R Axis:   270 Text Interpretation: A fib with RVR Right bundle branch block Bifasicular block similar to prior Confirmed by Nanda Quinton [phone]) on 12/15/2021 10:36:46 AM        ____________________________________________  RADIOLOGY  CT CHEST ABDOMEN PELVIS W CONTRAST  Result Date: 12/15/2021 CLINICAL DATA:  Sepsis, abdominal pain, shortness of breath EXAM: CT CHEST, ABDOMEN, AND PELVIS WITH CONTRAST TECHNIQUE: Multidetector CT imaging of the chest, abdomen and pelvis was performed following the standard protocol during bolus administration of intravenous contrast. CONTRAST:  179mL OMNIPAQUE IOHEXOL 300 MG/ML  SOLN COMPARISON:  CT chest abdomen pelvis angiogram, 07/31/2021 FINDINGS: CT CHEST FINDINGS Cardiovascular: Aortic atherosclerosis. Left chest multi lead pacer. Normal heart size. Three-vessel coronary artery calcifications. No pericardial effusion. Mediastinum/Nodes: Unchanged prominent mediastinal lymph nodes. Thyroid gland, trachea, and esophagus demonstrate no significant findings. Lungs/Pleura: Moderate centrilobular and paraseptal emphysema. Redemonstrated fibrotic interstitial lung disease, with a bibasilar predominant pattern of irregular peripheral interstitial opacity, subpleural bronchiolectasis, and thin walled cysts, unchanged compared to prior examination. Unchanged small pulmonary nodules, largest in the anterior left upper lobe and irregular nodule measuring 0.7 cm (series 4, image 65). No pleural effusion or pneumothorax.  Musculoskeletal: No chest wall mass or suspicious bone lesions identified. CT ABDOMEN PELVIS FINDINGS Hepatobiliary: No focal liver abnormality is seen. Status post cholecystectomy. No biliary dilatation. Pancreas: Unremarkable. No pancreatic ductal dilatation or surrounding inflammatory changes. Spleen: Normal in size without significant abnormality. Adrenals/Urinary Tract: Adrenal glands are unremarkable. Kidneys are normal, without renal calculi, solid lesion, or hydronephrosis. Bladder is unremarkable. Stomach/Bowel: Stomach is within normal limits. Appendix appears normal. Mild wall thickening of the decompressed cecum (series 5, image 73). Vascular/Lymphatic: Aortic atherosclerosis. Redemonstrated aneurysm of the infrarenal abdominal aorta, measuring 4.9 x 4.6 cm. There has been an increase in burden of mural thrombus (series 2, image 79). No enlarged abdominal or pelvic lymph nodes. Reproductive: No mass or other abnormality. Other: No abdominal wall hernia or abnormality. No abdominopelvic ascites. Musculoskeletal: No acute or significant osseous findings. IMPRESSION: 1. Mild wall thickening of the decompressed cecum, suggestive of nonspecific infectious, inflammatory, or ischemic colitis. 2. No other acute findings to suggest etiology of sepsis. 3. Redemonstrated aneurysm of the infrarenal abdominal aorta, measuring 4.9 x 4.6 cm. There has been an increase in burden of mural thrombus. Recommend follow-up CT/MR every 6 months and vascular consultation. This recommendation follows ACR consensus guidelines: White Paper of the ACR Incidental Findings Committee II on Vascular Findings. J Am Coll Radiol 2013; 10:789-794. 4. Redemonstrated fibrotic interstitial lung disease, with a bibasilar predominant pattern of irregular peripheral interstitial opacity, subpleural bronchiolectasis, and thin walled cysts, unchanged compared to prior examination. Findings remain consistent with an "alternative diagnosis" pattern  by ATS pulmonary fibrosis criteria. 5. Unchanged small pulmonary nodules, largest in the anterior left upper lobe measuring 0.7 cm. Future CT at 18-24 months (from baseline dated 07/31/2021 scan) is considered optional for low-risk patients, but is recommended for high-risk patients. This recommendation follows the consensus statement: Guidelines for Management of Incidental Pulmonary Nodules Detected on CT Images: From the Fleischner Society 2017; Radiology 2017; 284:228-243. 6. Coronary artery disease. 7. Emphysema. Aortic Atherosclerosis (ICD10-I70.0) and Emphysema (ICD10-J43.9).  Electronically Signed   By: Delanna Ahmadi M.D.   On: 12/15/2021 12:52   DG Chest Portable 1 View  Result Date: 12/15/2021 CLINICAL DATA:  Cough, shortness of breath EXAM: PORTABLE CHEST 1 VIEW COMPARISON:  Chest radiograph 07/31/2021 FINDINGS: The left chest wall cardiac device and associated leads are stable. The cardiomediastinal silhouette is stable. There are patchy opacities in the right base. There is no other focal airspace opacity. There is no pulmonary edema. There is no pleural effusion or pneumothorax. There is no acute osseous abnormality. IMPRESSION: Patchy opacities in the right base could reflect developing infection in the correct clinical setting. Electronically Signed   By: Valetta Mole M.D.   On: 12/15/2021 11:07    ____________________________________________   PROCEDURES  Procedure(s) performed:   Procedures  CRITICAL CARE Performed by: Margette Fast Total critical care time: 75 minutes Critical care time was exclusive of separately billable procedures and treating other patients. Critical care was necessary to treat or prevent imminent or life-threatening deterioration. Critical care was time spent personally by me on the following activities: development of treatment plan with patient and/or surrogate as well as nursing, discussions with consultants, evaluation of patient's response to  treatment, examination of patient, obtaining history from patient or surrogate, ordering and performing treatments and interventions, ordering and review of laboratory studies, ordering and review of radiographic studies, pulse oximetry and re-evaluation of patient's condition.  Nanda Quinton, MD Emergency Medicine  ____________________________________________   INITIAL IMPRESSION / ASSESSMENT AND PLAN / ED COURSE  Pertinent labs & imaging results that were available during my care of the patient were reviewed by me and considered in my medical decision making (see chart for details).   Patient presents to the emergency department with 10 days of fatigue along with shortness of breath, decreased appetite, cough/congestion symptoms.  He overall looks well-appearing but arrives with a heart rate in the 1 50-1 60 range.  The rate is up and down in the 130-160 range.  He does have bifascicular block shown on prior tracings.  On his EKG here, this appears to be A. fib with the underlying block making it appear somewhat wide.  It does not seem consistent with V. tach.  He is supposed to be taking metoprolol as well as amiodarone daily but has been off of all medications over the last several days with decreased appetite.  He states he was told he should be taking his medications with some food and so with him not eating he has not been taking the medicines.  Plan to start amiodarone here to help her rate control at this time.  His abdomen is nontender on my exam.  Plan for initial blood work with gentle IV fluid bolus.  He describes feeling cold intermittently and so we will screen for underlying sepsis but no clear infection source other than respiratory URI symptoms.  He does have O2 sats in the high 80s on arrival.  He is breathing through his mouth although not in distress.  He was placed on nonrebreather to help with this mild hypoxemia.   11:46 AM  Patient's heart rate is much improved on amiodarone  infusion.  He is tolerating this well with blood pressure in the 120s.  His lactic acid is come back severely elevated at 6.3.  Troponin is only mildly elevated at 59 likely related to elevated heart rate and potentially from underlying infectious process/sepsis.  White blood cell count is 12.1 and there is some suggestion of possible  pneumonia on chest x-ray.  Plan for CT chest, abdomen, pelvis along with additional IV fluid bolus.  Plan to replace the patient's potassium with K of 3.0.  02:00 PM  Patient is looking very well.  His heart rate is mainly in the 80-90 range.  CT imaging reviewed.  No focal tenderness where the patient is having possible colitis changes.  Doubt ischemic colitis clinically.  Patient appears very comfortable and describes more of a mild soreness in his abdomen but most of his symptoms seem to be URI, cough, poor appetite.  And to continue resuscitation.  His COVID test has come back positive which explains many of his symptoms.  Heart rate well controlled on amio infusion.   Discussed patient's case with [facility] to request admission. Patient and family (if present) updated with plan. Care transferred to [facility] service.  I reviewed all nursing notes, vitals, pertinent old records, EKGs, labs, imaging (as available).  ____________________________________________  FINAL CLINICAL IMPRESSION(S) / ED DIAGNOSES  Final diagnoses:  Acute respiratory failure with hypoxia (HCC)  Lactic acidosis  COVID-19  Atrial fibrillation with RVR ([place])     MEDICATIONS GIVEN DURING THIS VISIT:  Medications  amiodarone (NEXTERONE PREMIX) 360-4.14 MG/200ML-% (1.8 mg/mL) IV infusion (0 mg/hr Intravenous Stopped 12/15/21 1807)    Followed by  amiodarone (NEXTERONE PREMIX) 360-4.14 MG/200ML-% (1.8 mg/mL) IV infusion (29.88 mg/hr Intravenous Infusion Verify 12/16/21 0336)  multivitamin with minerals tablet 1 tablet (1 tablet Oral Given 12/15/21 1808)  remdesivir 100 mg in sodium chloride 0.9 % 100  mL IVPB (has no administration in time range)  MEDLINE mouth rinse (15 mLs Mouth Rinse Given 12/15/21 2200)  Chlorhexidine Gluconate Cloth 2 % PADS 6 each (6 each Topical Given 12/15/21 1810)  amLODipine (NORVASC) tablet 5 mg (5 mg Oral Given 12/15/21 2239)  atorvastatin (LIPITOR) tablet 40 mg (40 mg Oral Given 12/15/21 2238)  pantoprazole (PROTONIX) EC tablet 80 mg (80 mg Oral Given 12/15/21 2239)  rivaroxaban (XARELTO) tablet 20 mg (20 mg Oral Given 12/15/21 2008)  levothyroxine (SYNTHROID) tablet 75 mcg (75 mcg Oral Given 12/16/21 0449)  metoprolol succinate (TOPROL-XL) 24 hr tablet 100 mg (100 mg Oral Given 12/15/21 1807)  metoprolol tartrate (LOPRESSOR) injection 2.5 mg (2.5 mg Intravenous Given 12/15/21 1816)  sodium chloride flush (NS) 0.9 % injection 3 mL (3 mLs Intravenous Given 12/15/21 2200)  sodium chloride flush (NS) 0.9 % injection 3 mL (has no administration in time range)  0.9 %  sodium chloride infusion (has no administration in time range)  acetaminophen (TYLENOL) tablet 650 mg (has no administration in time range)    Or  acetaminophen (TYLENOL) suppository 650 mg (has no administration in time range)  HYDROcodone-acetaminophen (NORCO/VICODIN) 5-325 MG per tablet 1-2 tablet (has no administration in time range)  methylPREDNISolone sodium succinate (SOLU-MEDROL) 125 mg/2 mL injection 70.625 mg (70.625 mg Intravenous Given 12/16/21 0449)    Followed by  predniSONE (DELTASONE) tablet 50 mg (has no administration in time range)  ascorbic acid (VITAMIN C) tablet 500 mg (500 mg Oral Given 12/15/21 2008)  zinc sulfate capsule 220 mg (220 mg Oral Given 12/15/21 2008)  ferrous sulfate tablet 325 mg (has no administration in time range)  losartan (COZAAR) tablet 100 mg (100 mg Oral Given 12/15/21 2240)  potassium chloride SA (KLOR-CON M) CR tablet 40 mEq (has no administration in time range)  sodium chloride 0.9 % bolus 500 mL (0 mLs Intravenous Stopped 12/15/21 1210)  amiodarone  (NEXTERONE) 1.8 mg/mL load via infusion 150 mg (0  mg Intravenous Hold 12/15/21 1106)  sodium chloride 0.9 % bolus 1,000 mL (0 mLs Intravenous Stopped 12/15/21 1339)  cefTRIAXone (ROCEPHIN) 2 g in sodium chloride 0.9 % 100 mL IVPB (0 g Intravenous Stopped 12/15/21 1330)  potassium chloride SA (KLOR-CON M) CR tablet 40 mEq (40 mEq Oral Given 12/15/21 1249)  iohexol (OMNIPAQUE) 300 MG/ML solution 100 mL (100 mLs Intravenous Contrast Given 12/15/21 1211)  vancomycin (VANCOCIN) IVPB 1000 mg/200 mL premix (0 mg Intravenous Stopped 12/15/21 1504)  magnesium sulfate IVPB 2 g 50 mL (0 g Intravenous Stopped 12/15/21 1504)  remdesivir 100 mg in sodium chloride 0.9 % 100 mL IVPB (0 mg Intravenous Stopped 12/15/21 1700)  potassium chloride SA (KLOR-CON M) CR tablet 40 mEq (40 mEq Oral Given 12/15/21 1808)    Note:  This document was prepared using Dragon voice recognition software and may include unintentional dictation errors.  Nanda Quinton, MD, [facility] Emergency Medicine    Merlene Dante, [facility], MD 12/16/21 [phone]

## 2021-12-15 NOTE — Progress Notes (Addendum)
Pharmacy Antibiotic Note  Jason [name] is a 75 y.o. male admitted on 12/15/2021 with possible pneumonia.  Pharmacy has been consulted for Vancomycin dosing.  Scr slightly elevated. Patient presenting with leukocytosis.  Plan: Vancomycin 1000 mg IV x 1, then 1000 mg IV q24hr (eAUC: 445). Ceftriaxone 2g IV x 1. Follow-up for continuation.  Monitor clinical course, renal function, WBC. Deescalate when able. Levels at steady state.   Height: 5\' 8"  (172.7 cm) Weight: 71.7 kg (158 lb) IBW/kg (Calculated) : 68.4  Temp (24hrs), Avg:97 F (36.1 C), Min:97 F (36.1 C), Max:97 F (36.1 C)  Recent Labs  Lab 12/15/21 1050  WBC 12.1*  CREATININE 1.35*  LATICACIDVEN 6.3*    Estimated Creatinine Clearance: 47.1 mL/min (A) (by C-G formula based on SCr of 1.35 mg/dL (H)).    No Known Allergies  Antimicrobials this admission: Vancomycin 12/20 >> Ceftriaxone 12/20 >>  Microbiology results: pending  Thank you for allowing pharmacy to be a part of this patients care.  Debbora Presto PharmD Candidate 12/15/2021 12:06 PM

## 2021-12-15 NOTE — Progress Notes (Signed)
Pharmacy: Imatinib  Imatinib ([name]) hold criteria Hgb < 8 ANC < 1 Pltc < 100K AST or ALT >5x ULN Bilirubin > 3x ULN Gastrointestinal perforation Hemorrhage New or worsened CHF Severe fluid overload Active infection (on Remdesivir for COVID)  Plan: - Will hold imatinib while patient has active infection per P&T policy  Dia Sitter, PharmD, BCPS 12/15/2021 7:04 PM

## 2021-12-15 NOTE — Progress Notes (Signed)
1645 Pt arrived via [facility] from [facility]. Per transport, pt was given second dose of remdesivir. Pt A&Ox4, denies CP, pain, admits to SOB on exertion. Pt given CHG bath and skin checked. Abrasion to right ear documented. Pt on tele, Afib with frequent runs of SVT, rate in the 130s.   36 Dr. Maylene Roes paged to notify patient has arrived and notified about heart rhythm. Amio gtt and LR infusing.  [place] Dr. Maylene Roes at bedside accessing patient.   [place] orders received for IV lopressor.   1820 Pt converted to NSR.

## 2021-12-15 NOTE — H&P (Signed)
History and Physical    Jason [name] SAY:[id number] DOB: 1946-06-20 DOA: 12/15/2021  PCP: Mosie Lukes, MD  Patient coming from: Home  Chief Complaint: SOB  HPI: Jason [name] is a 75 y.o. male with medical history significant of paroxysmal A. fib, tachybradycardia syndrome with pacemaker in place, CML, hypothyroidism, hypertension, hyperlipidemia, CKD who states that he was feeling ill last week with poor appetite.  As many of his medications are supposed to be taken with food, he has missed doses of his medications.  He continued to feel poorly over the last 10 days, with shortness of breath, feeling queasy without vomiting.  He denies any fevers or cough, no chest pain.    ED Course: In the emergency department, patient was found to be in A. fib RVR with heart rate up to 160, was started on amiodarone drip.  He also tested positive for COVID, was started on Decadron and Remdesivir.  He was given empiric antibiotics vancomycin, Rocephin.  He required nonrebreather which was weaned down to 6 L nasal cannula O2.  He was transferred to [facility].  Review of Systems: As per HPI. Otherwise, all other review of systems reviewed and are negative.   Past Medical History:  Diagnosis Date   Anemia 05/08/2015   CML (chronic myelocytic leukemia) ([place]) 12/16/2011   Coronary heart disease    CVA (cerebrovascular accident) ([place])    GERD (gastroesophageal reflux disease)    Grief reaction 03/18/2014   H/O tobacco use, presenting hazards to health 02/24/2012   No cigarettes since pacemaker placement. Encouraged ongoing efforts    History of chicken pox    History of kidney stones    Hyperlipidemia    Hypertension    IBS (irritable bowel syndrome) 09/20/2013   Medicare annual wellness visit, subsequent 03/23/2014   PAF (paroxysmal atrial fibrillation) (HCC)    Symptomatic bradycardia    a. symptomatic bradycardia due to sinus node dysfunction s/p RA and RV Medtronic pacemaker 04/15/16     Past Surgical History:  Procedure Laterality Date   CATARACT EXTRACTION     CHOLECYSTECTOMY  2003   CORONARY ANGIOPLASTY WITH STENT PLACEMENT  2000   EP IMPLANTABLE DEVICE N/A 04/15/2016   Procedure: Pacemaker Implant;  Surgeon: Evans Lance, MD; Medtronic (serial number [id number]) pacemaker; Laterality: Left     reports that he quit smoking about 5 years ago. His smoking use included cigarettes. He started smoking about 54 years ago. He has a 10.00 pack-year smoking history. He has never used smokeless tobacco. He reports that he does not drink alcohol and does not use drugs.  No Known Allergies  Family History  Problem Relation Age of Onset   Arthritis Mother    Hypertension Mother    Heart disease Mother        Rheumatic fever   Rheumatic fever Mother    Other Mother        brain tumor   Cancer Mother        leukemia   Hypertension Father    Alcohol abuse Father    Diabetes Son    Colon cancer Neg Hx    Breast cancer Neg Hx    Prostate cancer Neg Hx      Prior to Admission medications   Medication Sig Start Date End Date Taking? Authorizing Provider  acetaminophen (TYLENOL) 325 MG tablet Take 650 mg by mouth every 6 (six) hours as needed. For headache.    [provider]  amiodarone (PACERONE)  200 MG tablet Take 1/2 (one-half) tablet by mouth once daily 10/08/21   Camnitz, Ocie Doyne, MD  amLODipine (NORVASC) 5 MG tablet Take 1 tablet by mouth once daily 10/08/21   Lelon Perla, MD  atorvastatin (LIPITOR) 40 MG tablet Take 1 tablet (40 mg total) by mouth daily. 10/12/21 10/12/22  Camnitz, Ocie Doyne, MD  dicyclomine (BENTYL) 20 MG tablet Take 1 tablet (20 mg total) by mouth 2 (two) times daily. 08/01/17   Long, Wonda Olds, MD  EUTHYROX 50 MCG tablet TAKE 1 TABLET BY MOUTH ONCE DAILY BEFORE BREAKFAST 09/22/21   Mosie Lukes, MD  ferrous sulfate 325 (65 FE) MG EC tablet Take 1 tablet (325 mg total) by mouth 2 (two) times daily. 09/02/15   Irene Shipper,  MD  fluticasone [facility]) 50 MCG/ACT nasal spray Place 2 sprays into both nostrils daily. 07/07/21   Mosie Lukes, MD  imatinib ([name]) 400 MG tablet TAKE 1 TABLET (400MG  TOTAL) BY MOUTH DAILY. TAKE WITH A MEAL AND LARGE GLASS OF WATER. 10/03/21   Volanda Napoleon, MD  levothyroxine (SYNTHROID) 25 MCG tablet Take 1 tablet (25 mcg total) by mouth daily before breakfast. 11/17/21   Mosie Lukes, MD  losartan (COZAAR) 100 MG tablet Take 1 tablet (100 mg total) by mouth daily. 07/27/21   Mosie Lukes, MD  magnesium oxide (MAG-OX) 400 MG tablet Take 1 tablet (400 mg total) by mouth daily. Except Tuesday and Saturday take it BID. 11/17/21   Mosie Lukes, MD  meclizine (ANTIVERT) 25 MG tablet TAKE 1 TABLET THREE TIMES DAILY 09/14/19   Mosie Lukes, MD  metoprolol succinate (TOPROL XL) 100 MG 24 hr tablet Take 1 tablet (100 mg total) by mouth daily. 09/21/21   Camnitz, Ocie Doyne, MD  nitroGLYCERIN (NITROSTAT) 0.4 MG SL tablet Place 1 tablet (0.4 mg total) under the tongue every 5 (five) minutes as needed for chest pain. 08/18/21   Mosie Lukes, MD  ofloxacin (FLOXIN OTIC) 0.3 % OTIC solution Place 10 drops into both ears daily. For 7 days 09/15/17   Carollee Herter, Kendrick Fries R, DO  omeprazole (PRILOSEC) 40 MG capsule TAKE 1 CAPSULE EVERY DAY (NEED OFFICE VISIT FOR GREATER QUANTITIES/REFILLS) 10/07/21   Mosie Lukes, MD  ondansetron (ZOFRAN) 4 MG tablet Take 1 tablet (4 mg total) by mouth 2 (two) times daily as needed for nausea or vomiting. 07/02/21   Volanda Napoleon, MD  potassium chloride SA (KLOR-CON) 20 MEQ tablet Take 1 tablet (20 mEq total) by mouth 3 (three) times daily. 07/31/21   Charlesetta Shanks, MD  Probiotic Product (PROBIOTIC FORMULA PO) Take 1 capsule by mouth daily.     [provider]  rivaroxaban (XARELTO) 20 MG TABS tablet TAKE 1 TABLET BY MOUTH ONCE DAILY WITH SUPPER 10/26/21   Camnitz, Ocie Doyne, MD  Saccharomyces boulardii (PROBIOTIC) 250 MG CAPS Take 250 mg by mouth  daily. 01/20/21   Mosie Lukes, MD  Vitamin D, Ergocalciferol, (DRISDOL) 1.25 MG (50000 UNIT) CAPS capsule Take 1 capsule (50,000 Units total) by mouth every 7 (seven) days. 11/17/21   Mosie Lukes, MD    Physical Exam: Vitals:   12/15/21 1345 12/15/21 1400 12/15/21 1445 12/15/21 1509  BP: 138/85 121/80 (!) 142/81 139/82  Pulse: 93 80 86 86  Resp: (!) 25 (!) 22 12 15   Temp:    99 F (37.2 C)  TempSrc:      SpO2: 95% 95% 95% 97%  Weight:  Height:         Constitutional: NAD, calm, comfortable Eyes: PERRL, lids and conjunctivae normal ENMT: Mucous membranes are dry. Normal dentition.  Respiratory: Diminished breath sounds without wheezing or crackles, normal respiratory effort, no accessory muscle use, no conversational dyspnea, on nasal cannula O2 Cardiovascular: Irregular rhythm, tachycardic with heart rate up to 130s, no murmurs. No extremity edema.  Abdomen: Soft, nondistended, nontender to palpation. Bowel sounds positive.  Musculoskeletal: No joint deformity upper and lower extremities. No contractures. Normal muscle tone.  Skin: no rashes, lesions, ulcers on exposed skin  Neurologic: Alert and oriented, speech fluent, CN 2-12 grossly intact. No focal deficits.   Psychiatric: Normal judgment and insight. Normal mood and affect   Labs on Admission: I have personally reviewed following labs and imaging studies  CBC: Recent Labs  Lab 12/15/21 1050  WBC 12.1*  NEUTROABS 10.0*  HGB 16.0  HCT 47.3  MCV 92.7  PLT 443   Basic Metabolic Panel: Recent Labs  Lab 12/15/21 1050  NA 142  K 3.0*  CL 103  CO2 20*  GLUCOSE 100*  BUN 21  CREATININE 1.35*  CALCIUM 8.8*  MG 1.4*   GFR: Estimated Creatinine Clearance: 47.1 mL/min (A) (by C-G formula based on SCr of 1.35 mg/dL (H)). Liver Function Tests: Recent Labs  Lab 12/15/21 1050  AST 36  ALT 13  ALKPHOS 90  BILITOT 1.0  PROT 7.6  ALBUMIN 3.6   Recent Labs  Lab 12/15/21 1050  LIPASE 268*   No  results for input(s): AMMONIA in the last 168 hours. Coagulation Profile: No results for input(s): INR, PROTIME in the last 168 hours. Cardiac Enzymes: No results for input(s): CKTOTAL, CKMB, CKMBINDEX, TROPONINI in the last 168 hours. BNP (last 3 results) No results for input(s): PROBNP in the last 8760 hours. HbA1C: No results for input(s): HGBA1C in the last 72 hours. CBG: No results for input(s): GLUCAP in the last 168 hours. Lipid Profile: No results for input(s): CHOL, HDL, LDLCALC, TRIG, CHOLHDL, LDLDIRECT in the last 72 hours. Thyroid Function Tests: No results for input(s): TSH, T4TOTAL, FREET4, T3FREE, THYROIDAB in the last 72 hours. Anemia Panel: No results for input(s): VITAMINB12, FOLATE, FERRITIN, TIBC, IRON, RETICCTPCT in the last 72 hours. Urine analysis:    Component Value Date/Time   COLORURINE YELLOW 07/31/2021 1845   APPEARANCEUR CLEAR 07/31/2021 1845   LABSPEC <1.005 (L) 07/31/2021 1845   PHURINE 6.5 07/31/2021 1845   GLUCOSEU NEGATIVE 07/31/2021 1845   HGBUR NEGATIVE 07/31/2021 1845   BILIRUBINUR NEGATIVE 07/31/2021 1845   KETONESUR NEGATIVE 07/31/2021 1845   PROTEINUR NEGATIVE 07/31/2021 1845   UROBILINOGEN 0.2 06/27/2014 1315   NITRITE NEGATIVE 07/31/2021 1845   LEUKOCYTESUR NEGATIVE 07/31/2021 1845   Sepsis Labs: !!!!!!!!!!!!!!!!!!!!!!!!!!!!!!!!!!!!!!!!!!!! @LABRCNTIP (procalcitonin:4,lacticidven:4) ) Recent Results (from the past 240 hour(s))  Resp Panel by RT-PCR (Flu A&B, Covid) Nasopharyngeal Swab     Status: Abnormal   Collection Time: 12/15/21 11:21 AM   Specimen: Nasopharyngeal Swab; Nasopharyngeal(NP) swabs in vial transport medium  Result Value Ref Range Status   SARS Coronavirus 2 by RT PCR POSITIVE (A) NEGATIVE Final    Comment: (NOTE) SARS-CoV-2 target nucleic acids are DETECTED.  The SARS-CoV-2 RNA is generally detectable in upper respiratory specimens during the acute phase of infection. Positive results are indicative of the  presence of the identified virus, but do not rule out bacterial infection or co-infection with other pathogens not detected by the test. Clinical correlation with patient history and other diagnostic information is necessary  to determine patient infection status. The expected result is Negative.  Fact Sheet for Patients: EntrepreneurPulse.com.au  Fact Sheet for Healthcare Providers: IncredibleEmployment.be  This test is not yet approved or cleared by the [place] FDA and  has been authorized for detection and/or diagnosis of SARS-CoV-2 by FDA under an Emergency Use Authorization (EUA).  This EUA will remain in effect (meaning this test can be used) for the duration of  the COVID-19 declaration under Section 564(b)(1) of the A ct, 21 U.S.C. section 360bbb-3(b)(1), unless the authorization is terminated or revoked sooner.     Influenza A by PCR NEGATIVE NEGATIVE Final   Influenza B by PCR NEGATIVE NEGATIVE Final    Comment: (NOTE) The Xpert Xpress SARS-CoV-2/FLU/RSV plus assay is intended as an aid in the diagnosis of influenza from Nasopharyngeal swab specimens and should not be used as a sole basis for treatment. Nasal washings and aspirates are unacceptable for Xpert Xpress SARS-CoV-2/FLU/RSV testing.  Fact Sheet for Patients: EntrepreneurPulse.com.au  Fact Sheet for Healthcare Providers: IncredibleEmployment.be  This test is not yet approved or cleared by the [place] FDA and has been authorized for detection and/or diagnosis of SARS-CoV-2 by FDA under an Emergency Use Authorization (EUA). This EUA will remain in effect (meaning this test can be used) for the duration of the COVID-19 declaration under Section 564(b)(1) of the Act, 21 U.S.C. section 360bbb-3(b)(1), unless the authorization is terminated or revoked.  Performed at [facility], [address] [place], [place]  [address]      Radiological Exams on Admission: CT CHEST ABDOMEN PELVIS W CONTRAST  Result Date: 12/15/2021 CLINICAL DATA:  Sepsis, abdominal pain, shortness of breath EXAM: CT CHEST, ABDOMEN, AND PELVIS WITH CONTRAST TECHNIQUE: Multidetector CT imaging of the chest, abdomen and pelvis was performed following the standard protocol during bolus administration of intravenous contrast. CONTRAST:  168mL OMNIPAQUE IOHEXOL 300 MG/ML  SOLN COMPARISON:  CT chest abdomen pelvis angiogram, 07/31/2021 FINDINGS: CT CHEST FINDINGS Cardiovascular: Aortic atherosclerosis. Left chest multi lead pacer. Normal heart size. Three-vessel coronary artery calcifications. No pericardial effusion. Mediastinum/Nodes: Unchanged prominent mediastinal lymph nodes. Thyroid gland, trachea, and esophagus demonstrate no significant findings. Lungs/Pleura: Moderate centrilobular and paraseptal emphysema. Redemonstrated fibrotic interstitial lung disease, with a bibasilar predominant pattern of irregular peripheral interstitial opacity, subpleural bronchiolectasis, and thin walled cysts, unchanged compared to prior examination. Unchanged small pulmonary nodules, largest in the anterior left upper lobe and irregular nodule measuring 0.7 cm (series 4, image 65). No pleural effusion or pneumothorax. Musculoskeletal: No chest wall mass or suspicious bone lesions identified. CT ABDOMEN PELVIS FINDINGS Hepatobiliary: No focal liver abnormality is seen. Status post cholecystectomy. No biliary dilatation. Pancreas: Unremarkable. No pancreatic ductal dilatation or surrounding inflammatory changes. Spleen: Normal in size without significant abnormality. Adrenals/Urinary Tract: Adrenal glands are unremarkable. Kidneys are normal, without renal calculi, solid lesion, or hydronephrosis. Bladder is unremarkable. Stomach/Bowel: Stomach is within normal limits. Appendix appears normal. Mild wall thickening of the decompressed cecum (series 5, image 73).  Vascular/Lymphatic: Aortic atherosclerosis. Redemonstrated aneurysm of the infrarenal abdominal aorta, measuring 4.9 x 4.6 cm. There has been an increase in burden of mural thrombus (series 2, image 79). No enlarged abdominal or pelvic lymph nodes. Reproductive: No mass or other abnormality. Other: No abdominal wall hernia or abnormality. No abdominopelvic ascites. Musculoskeletal: No acute or significant osseous findings. IMPRESSION: 1. Mild wall thickening of the decompressed cecum, suggestive of nonspecific infectious, inflammatory, or ischemic colitis. 2. No other acute findings to suggest etiology of sepsis.  3. Redemonstrated aneurysm of the infrarenal abdominal aorta, measuring 4.9 x 4.6 cm. There has been an increase in burden of mural thrombus. Recommend follow-up CT/MR every 6 months and vascular consultation. This recommendation follows ACR consensus guidelines: White Paper of the ACR Incidental Findings Committee II on Vascular Findings. J Am Coll Radiol 2013; 10:789-794. 4. Redemonstrated fibrotic interstitial lung disease, with a bibasilar predominant pattern of irregular peripheral interstitial opacity, subpleural bronchiolectasis, and thin walled cysts, unchanged compared to prior examination. Findings remain consistent with an "alternative diagnosis" pattern by ATS pulmonary fibrosis criteria. 5. Unchanged small pulmonary nodules, largest in the anterior left upper lobe measuring 0.7 cm. Future CT at 18-24 months (from baseline dated 07/31/2021 scan) is considered optional for low-risk patients, but is recommended for high-risk patients. This recommendation follows the consensus statement: Guidelines for Management of Incidental Pulmonary Nodules Detected on CT Images: From the Fleischner Society 2017; Radiology 2017; 284:228-243. 6. Coronary artery disease. 7. Emphysema. Aortic Atherosclerosis (ICD10-I70.0) and Emphysema (ICD10-J43.9). Electronically Signed   By: Delanna Ahmadi M.D.   On: 12/15/2021  12:52   DG Chest Portable 1 View  Result Date: 12/15/2021 CLINICAL DATA:  Cough, shortness of breath EXAM: PORTABLE CHEST 1 VIEW COMPARISON:  Chest radiograph 07/31/2021 FINDINGS: The left chest wall cardiac device and associated leads are stable. The cardiomediastinal silhouette is stable. There are patchy opacities in the right base. There is no other focal airspace opacity. There is no pulmonary edema. There is no pleural effusion or pneumothorax. There is no acute osseous abnormality. IMPRESSION: Patchy opacities in the right base could reflect developing infection in the correct clinical setting. Electronically Signed   By: Valetta Mole M.D.   On: 12/15/2021 11:07    EKG: Independently reviewed. A Fib   Assessment/Plan Principal Problem:   COVID-19 virus infection Active Problems:   CML (chronic myelocytic leukemia) (HCC)   Hyperlipidemia, mixed   Atrial fibrillation with RVR (HCC)   Hypokalemia   IBS (irritable bowel syndrome)   Chronic anticoagulation   Abdominal aortic aneurysm   Tachy-brady syndrome (HCC)   PAF (paroxysmal atrial fibrillation) (HCC)   Hypertension   GERD (gastroesophageal reflux disease)   Hypothyroidism   Low magnesium level   Sepsis due to undetermined organism (HCC)   Acute hypoxemic respiratory failure (HCC)    A Fib RVR -Hx of tachy-brady syndrome with pacemaker placed 03/2016  -CHA2DS2-VASc of 5, Continue xarelto -Continue amiodarone gtt, toprol  -Followed by [name] and Dr. Curt Bears   Acute hypoxemic respiratory failure secondary to COVID-19  -In the ED, SpO2 found to be 88% on room air -Required NRB in the ED and now weaned down. Continue 6 L O2, wean as able  Severe sepsis secondary to COVID-19  -Sepsis POA with leukocytosis, lactic acidosis, tachycardia, tachypnea  -Tested positive on 12/15/2021 -Continue Remdesivir 12/20 >>  -Continue steroids 12/20 >>  -Supportive treatments as ordered, encourage IS, encourage prone positioning,  encourage mobilization  -Check covid labs including procalcitonin. Will observe off antibiotics for now   CML -Followed by Dr. Marin Olp. Continue [name]   Hypokalemia -Replace, trend   Hypomagnesemia  -Replace, trend   Hypothyroidism -Continue synthroid -Check TSH   HTN -Continue norvasc, cozaar, toprol   HLD -Continue lipitor   CKD stage 3a  -Baseline Cr 1.1-1.2  -Stable   Demand ischemia -Troponin 59 >> 58   Infrarenal abdominal aorta -Incidental finding. Follow up with repeat imaging and vascular surgery referral as outpatient   Pulmonary nodules -Follow up outpatient  DVT prophylaxis: Xarelto   Code Status: Full code, confirmed with patient at time of admission Family Communication: No family at bedside, patient states that his granddaughter is aware that he is in the hospital Disposition Plan: Return back home  Consults called: None   Severity of Illness: The appropriate patient status for this patient is INPATIENT. Inpatient status is judged to be reasonable and necessary in order to provide the required intensity of service to ensure the patient's safety. The patient's presenting symptoms, physical exam findings, and initial radiographic and laboratory data in the context of their chronic comorbidities is felt to place them at high risk for further clinical deterioration. Furthermore, it is not anticipated that the patient will be medically stable for discharge from the hospital within 2 midnights of admission.   * I certify that at the point of admission it is my clinical judgment that the patient will require inpatient hospital care spanning beyond 2 midnights from the point of admission due to high intensity of service, high risk for further deterioration and high frequency of surveillance required.Dessa Phi, DO [facility] 12/15/2021, 4:57 PM   Available via Epic secure chat 7am-7pm After these hours, please refer to coverage provider  listed on [email or web address]

## 2021-12-15 NOTE — ED Notes (Signed)
Patient transported to CT 

## 2021-12-16 DIAGNOSIS — Z7901 Long term (current) use of anticoagulants: Secondary | ICD-10-CM

## 2021-12-16 DIAGNOSIS — I248 Other forms of acute ischemic heart disease: Secondary | ICD-10-CM | POA: Diagnosis present

## 2021-12-16 DIAGNOSIS — C9210 Chronic myeloid leukemia, BCR/ABL-positive, not having achieved remission: Secondary | ICD-10-CM

## 2021-12-16 DIAGNOSIS — N1832 Chronic kidney disease, stage 3b: Secondary | ICD-10-CM

## 2021-12-16 DIAGNOSIS — J9601 Acute respiratory failure with hypoxia: Secondary | ICD-10-CM

## 2021-12-16 DIAGNOSIS — I4891 Unspecified atrial fibrillation: Secondary | ICD-10-CM

## 2021-12-16 LAB — CBC WITH DIFFERENTIAL/PLATELET
Abs Immature Granulocytes: 0.02 10*3/uL (ref 0.00–0.07)
Basophils Absolute: 0 10*3/uL (ref 0.0–0.1)
Basophils Relative: 0 %
Eosinophils Absolute: 0 10*3/uL (ref 0.0–0.5)
Eosinophils Relative: 0 %
HCT: 39 % (ref 39.0–52.0)
Hemoglobin: 12.8 g/dL — ABNORMAL LOW (ref 13.0–17.0)
Immature Granulocytes: 0 %
Lymphocytes Relative: 8 %
Lymphs Abs: 0.4 10*3/uL — ABNORMAL LOW (ref 0.7–4.0)
MCH: 30.8 pg (ref 26.0–34.0)
MCHC: 32.8 g/dL (ref 30.0–36.0)
MCV: 93.8 fL (ref 80.0–100.0)
Monocytes Absolute: 0.1 10*3/uL (ref 0.1–1.0)
Monocytes Relative: 2 %
Neutro Abs: 4.8 10*3/uL (ref 1.7–7.7)
Neutrophils Relative %: 90 %
Platelets: 159 10*3/uL (ref 150–400)
RBC: 4.16 MIL/uL — ABNORMAL LOW (ref 4.22–5.81)
RDW: 14.3 % (ref 11.5–15.5)
WBC: 5.4 10*3/uL (ref 4.0–10.5)
nRBC: 0 % (ref 0.0–0.2)

## 2021-12-16 LAB — COMPREHENSIVE METABOLIC PANEL
ALT: 11 U/L (ref 0–44)
AST: 25 U/L (ref 15–41)
Albumin: 2.9 g/dL — ABNORMAL LOW (ref 3.5–5.0)
Alkaline Phosphatase: 74 U/L (ref 38–126)
Anion gap: 12 (ref 5–15)
BUN: 24 mg/dL — ABNORMAL HIGH (ref 8–23)
CO2: 20 mmol/L — ABNORMAL LOW (ref 22–32)
Calcium: 8.1 mg/dL — ABNORMAL LOW (ref 8.9–10.3)
Chloride: 109 mmol/L (ref 98–111)
Creatinine, Ser: 0.96 mg/dL (ref 0.61–1.24)
GFR, Estimated: >60 mL/min (ref >60)
Glucose, Bld: 164 mg/dL — ABNORMAL HIGH (ref 70–99)
Potassium: 3 mmol/L — ABNORMAL LOW (ref 3.5–5.1)
Sodium: 141 mmol/L (ref 135–145)
Total Bilirubin: 0.9 mg/dL (ref 0.3–1.2)
Total Protein: 6.1 g/dL — ABNORMAL LOW (ref 6.5–8.1)

## 2021-12-16 LAB — FERRITIN: Ferritin: 410 ng/mL — ABNORMAL HIGH (ref 24–336)

## 2021-12-16 LAB — D-DIMER, QUANTITATIVE: D-Dimer, Quant: 1.21 ug/mL-FEU — ABNORMAL HIGH (ref 0.00–0.50)

## 2021-12-16 LAB — MAGNESIUM: Magnesium: 1.9 mg/dL (ref 1.7–2.4)

## 2021-12-16 LAB — C-REACTIVE PROTEIN: CRP: 4.5 mg/dL — ABNORMAL HIGH (ref <1.0)

## 2021-12-16 LAB — PHOSPHORUS: Phosphorus: 2.5 mg/dL (ref 2.5–4.6)

## 2021-12-16 MED ORDER — AMIODARONE HCL 200 MG PO TABS
200.0000 mg | ORAL_TABLET | Freq: Every day | ORAL | Status: DC
  Administered 2021-12-17 – 2021-12-19 (×3): 200 mg via ORAL
  Filled 2021-12-16 (×4): qty 1

## 2021-12-16 MED ORDER — POTASSIUM CHLORIDE CRYS ER 20 MEQ PO TBCR
40.0000 meq | EXTENDED_RELEASE_TABLET | Freq: Two times a day (BID) | ORAL | Status: AC
  Administered 2021-12-16 (×2): 40 meq via ORAL
  Filled 2021-12-16 (×2): qty 2

## 2021-12-16 NOTE — Assessment & Plan Note (Signed)
Incidental finding, follow-up as an outpatient

## 2021-12-16 NOTE — Assessment & Plan Note (Signed)
Creatinine improved back to baseline 0.96

## 2021-12-16 NOTE — Assessment & Plan Note (Signed)
Secondary to A. fib with RVR and COVID.  Still on oxygen.

## 2021-12-16 NOTE — Assessment & Plan Note (Signed)
Continue levothyroxine 

## 2021-12-16 NOTE — Assessment & Plan Note (Signed)
Due to acquired thrombophilia due to A. fib - Continue Xarelto

## 2021-12-16 NOTE — Assessment & Plan Note (Signed)
-   Continue steroids and remdesivir

## 2021-12-16 NOTE — Progress Notes (Signed)
Progress Note    Jason [name]   ASN:[id number]  DOB: 08-24-1946  DOA: 12/15/2021     1 Date of Service: 12/16/2021   Brief summary: [name] is a 75 y.o. M with pAF, tachy-brady syndrome s/p PPM, CML on [name], hypothyroidism, HTN, and CKD who p/w 1 week poor appetitie, malaise for one week, progressive to SOB, nausea.     12/20: In the ED, Afib with RVR rate up to 160, started on amiodarone drip. COVID+ started on Decadron and Remdesivir.  Given empiric antibiotics vancomycin, Rocephin. Initially tachypneic in 71s, hypoxic in 80s, required NRB, weaned to 6L         Assessment and Plan * Acute hypoxemic respiratory failure ([place]) Secondary to A. fib with RVR and COVID.  Still on oxygen.  COVID-19 virus infection - Continue steroids and remdesivir  Severe sepsis ([place]) And septic shock due to COVID-19.  Antibiotics not indicated.  Lactate 6.3 on presentation, got 3 L normal saline, lactate resolved, sepsis physiology is improving.  Atrial fibrillation with RVR (HCC) Heart rate controlled overnight - Continue metoprolol -Stop IV amiodarone, resume oral amiodarone -Continue Xarelto  PAF (paroxysmal atrial fibrillation) (HCC) - Continue metoprolol, amiodarone, Xarelto  Tachy-brady syndrome (HCC) - Continue metoprolol, amiodarone  Hypomagnesemia - Supplement magnesium  Chronic anticoagulation Due to acquired thrombophilia due to A. fib - Continue Xarelto  CML (chronic myelocytic leukemia) (HCC) - Hold [name] given COVID  Hypokalemia - Supplement potassium - Check magnesium  Hypothyroidism - Continue levothyroxine  Hypertension Blood pressure controlled - Continue amlodipine, metoprolol, losartan  Stage 3a chronic kidney disease (CKD) (HCC) Creatinine improved back to baseline 0.96  Demand ischemia (HCC) Doubt ACS/myocardial infarction, no further ischemic work-up necessary  IBS (irritable bowel syndrome) - Hold dicyclomine  Pulmonary nodule less  than 1 cm in diameter with moderate to high risk for malignant neoplasm Incidental finding, follow-up as an outpatient  Abdominal aortic aneurysm Incidental finding, follow-up as an outpatient     Subjective:  Patient's chest pain, palpitations, dyspnea, and dizziness are all improving.  He has no confusion today.  Objective Vitals:   12/16/21 0904 12/16/21 1000 12/16/21 1100 12/16/21 1200  BP: (!) 153/67 (!) 151/64    Pulse: 62 (!) 59 60   Resp:  17 19   Temp:    97.8 F (36.6 C)  TempSrc:    Oral  SpO2:  96% 94%   Weight:      Height:       70.9 kg  Vital signs were reviewed and unremarkable.  Blood pressure slightly high.   Exam Physical Exam Constitutional:      Appearance: He is not ill-appearing or toxic-appearing.  HENT:     Head: Normocephalic and atraumatic.  Cardiovascular:     Rate and Rhythm: Normal rate.     Heart sounds: No murmur heard. Pulmonary:     Effort: Pulmonary effort is normal. No tachypnea.     Breath sounds: No decreased breath sounds, wheezing or rales.  Abdominal:     Palpations: Abdomen is soft. There is no mass.     Tenderness: There is no guarding.  Musculoskeletal:     Right lower leg: No edema.     Left lower leg: No edema.  Skin:    General: Skin is warm and dry.  Neurological:     General: No focal deficit present.     Mental Status: He is alert and oriented to person, place, and time.     Cranial  Nerves: No cranial nerve deficit.     Motor: Weakness present.  Psychiatric:        Mood and Affect: Mood normal.        Behavior: Behavior normal.       Labs / Other Information My review of labs, imaging, notes and other tests is significant for improved Cr, low K, low magnesium, resolved lactate, cxr with infiltrates which are stable relative to chronic     Disposition Plan: Status is: Inpatient  Remains inpatient appropriate because: still on O2  Spoke to granddaughter      Time spent: 35 minutes [facility] 12/16/2021, 3:27 PM

## 2021-12-16 NOTE — Assessment & Plan Note (Signed)
Heart rate controlled overnight - Continue metoprolol -Stop IV amiodarone, resume oral amiodarone -Continue Xarelto

## 2021-12-16 NOTE — Assessment & Plan Note (Signed)
-   Supplement potassium - Check magnesium 

## 2021-12-16 NOTE — Assessment & Plan Note (Signed)
-   Supplement magnesium 

## 2021-12-16 NOTE — Hospital Course (Addendum)
Jason [name] is a 75 y.o. M with pAF, tachy-brady syndrome s/p PPM, CML on [name], hypothyroidism, HTN, and CKD who p/w 1 week poor appetitie, malaise for one week, progressive to SOB, nausea.       12/20: In the ED, Afib with RVR rate up to 160, started on amiodarone drip. COVID+ started on Decadron and Remdesivir.  Given empiric antibiotics vancomycin, Rocephin. Initially tachypneic in 30s, hypoxic in 80s, required NRB, weaned to 6L  12/21: HR converted, still on O2 12/23: Weaning O2, still desturates to 80s with ambulation

## 2021-12-16 NOTE — Assessment & Plan Note (Signed)
-   Continue metoprolol, amiodarone

## 2021-12-16 NOTE — Assessment & Plan Note (Signed)
-   Hold [name] given COVID

## 2021-12-16 NOTE — Assessment & Plan Note (Signed)
And septic shock due to COVID-19.  Antibiotics not indicated.  Lactate 6.3 on presentation, got 3 L normal saline, lactate resolved, sepsis physiology is improving.

## 2021-12-16 NOTE — Assessment & Plan Note (Signed)
-   Hold dicyclomine

## 2021-12-16 NOTE — Assessment & Plan Note (Signed)
Blood pressure controlled - Continue amlodipine, metoprolol, losartan

## 2021-12-16 NOTE — Assessment & Plan Note (Signed)
Doubt ACS/myocardial infarction, no further ischemic work-up necessary

## 2021-12-16 NOTE — Assessment & Plan Note (Signed)
-   Continue metoprolol, amiodarone, Xarelto

## 2021-12-17 LAB — BASIC METABOLIC PANEL
Anion gap: 5 (ref 5–15)
BUN: 27 mg/dL — ABNORMAL HIGH (ref 8–23)
CO2: 22 mmol/L (ref 22–32)
Calcium: 8.3 mg/dL — ABNORMAL LOW (ref 8.9–10.3)
Chloride: 112 mmol/L — ABNORMAL HIGH (ref 98–111)
Creatinine, Ser: 1.1 mg/dL (ref 0.61–1.24)
GFR, Estimated: >60 mL/min (ref >60)
Glucose, Bld: 152 mg/dL — ABNORMAL HIGH (ref 70–99)
Potassium: 3.4 mmol/L — ABNORMAL LOW (ref 3.5–5.1)
Sodium: 139 mmol/L (ref 135–145)

## 2021-12-17 LAB — MAGNESIUM: Magnesium: 1.8 mg/dL (ref 1.7–2.4)

## 2021-12-17 MED ORDER — MAGNESIUM SULFATE 2 GM/50ML IV SOLN
2.0000 g | Freq: Once | INTRAVENOUS | Status: AC
  Administered 2021-12-17: 08:00:00 2 g via INTRAVENOUS
  Filled 2021-12-17: qty 50

## 2021-12-17 MED ORDER — POTASSIUM CHLORIDE CRYS ER 20 MEQ PO TBCR
40.0000 meq | EXTENDED_RELEASE_TABLET | Freq: Two times a day (BID) | ORAL | Status: AC
  Administered 2021-12-17 (×2): 40 meq via ORAL
  Filled 2021-12-17 (×2): qty 2

## 2021-12-17 NOTE — Assessment & Plan Note (Signed)
Incidental finding, follow-up as an outpatient

## 2021-12-17 NOTE — Assessment & Plan Note (Signed)
Doubt ACS/myocardial infarction, no further ischemic work-up necessary

## 2021-12-17 NOTE — Assessment & Plan Note (Signed)
-   Suppl K

## 2021-12-17 NOTE — Progress Notes (Signed)
°  Progress Note   Patient: Jason [name] ERD:[id number] DOB: 1946/03/06 DOA: 12/15/2021     2 DOS: the patient was seen and examined on 12/17/2021   Brief hospital course: Mr. Vitug is a 75 y.o. M with pAF, tachy-brady syndrome s/p PPM, CML on [name], hypothyroidism, HTN, and CKD who p/w 1 week poor appetitie, malaise for one week, progressive to SOB, nausea.     12/20: In the ED, Afib with RVR rate up to 160, started on amiodarone drip. COVID+ started on Decadron and Remdesivir.  Given empiric antibiotics vancomycin, Rocephin. Initially tachypneic in 26s, hypoxic in 80s, required NRB, weaned to 6L  12/21: HR converted, still on O2   Assessment and Plan * Acute hypoxemic respiratory failure ([place])- (present on admission) Secondary to A. fib with RVR and COVID.  Still on oxygen.  COVID-19 virus infection Weaned o2 to 1L today - Continue steroids and remdesivir  Severe sepsis ([place])- (present on admission) And septic shock due to COVID-19.  Antibiotics not indicated.  Lactate 6.3 on presentation, got 3 L normal saline, lactate resolved, sepsis physiology is improving.  Atrial fibrillation with RVR (HCC) Heart rate controlled  - Continue metoprolol -continue amiodarone -Continue Xarelto  PAF (paroxysmal atrial fibrillation) (HCC)- (present on admission) - Continue metoprolol, amiodarone, Xarelto  Tachy-brady syndrome ([place])- (present on admission) - Continue metoprolol, amiodarone  Hypomagnesemia- (present on admission) - Supplement magnesium again  Chronic anticoagulation Due to acquired thrombophilia due to A. fib - Continue Xarelto  CML (chronic myelocytic leukemia) ([place])- (present on admission) - Hold [name] given COVID  Hypokalemia- (present on admission) - Suppl K  Hypothyroidism- (present on admission) - Continue levothyroxine  Hypertension- (present on admission) Blood pressure controlled - Continue amlodipine, metoprolol, losartan  Stage 3a chronic kidney  disease (CKD) (HCC) Creatinine improved back to baseline 0.96  Demand ischemia (HCC)- (present on admission) Doubt ACS/myocardial infarction, no further ischemic work-up necessary  Abdominal aortic aneurysm- (present on admission) Incidental finding, follow-up as an outpatient  IBS (irritable bowel syndrome)- (present on admission) - Hold dicyclomine  Pulmonary nodule less than 1 cm in diameter with moderate to high risk for malignant neoplasm Incidental finding, follow-up as an outpatient     Subjective: Feeling stronger, appetite is improving.  No palpitation, chest pain.  No dyspnea.  Still on oxygen.  Desaturating with exertion.  Objective Heart rate normal, afebrile, desaturating to the 80s with exertion. General appearance: Elderly adult male, lying in bed, no acute distress, nasal cannula in place     HEENT:    Skin:  Cardiac: RRR, no murmurs, no lower extremity edema Respiratory: Lung sounds diminished bilaterally, but no rales or wheezing. Abdomen: Abdomen soft without tenderness palpation or guarding, no ascites or distention MSK:  Neuro: Awake and alert, extraocular movements intact, moves upper extremities with generalized weakness but symmetric strength, speech fluent Psych: Normal, affect normal, judgment Syprine normal    Data Reviewed: Results significant for low potassium, low magnesium, elevated glucose.  Family Communication: Spoke to granddaughter  Disposition: Status is: Inpatient  Remains inpatient appropriate because: reMains on supplemental oxygen which is not his baseline            Author: Edwin Dada 12/17/2021 5:38 PM  For on call review [email or web address].

## 2021-12-17 NOTE — Assessment & Plan Note (Signed)
Continue levothyroxine 

## 2021-12-17 NOTE — Assessment & Plan Note (Signed)
Weaned o2 to 1L today - Continue steroids and remdesivir

## 2021-12-17 NOTE — Assessment & Plan Note (Signed)
-   Supplement magnesium again

## 2021-12-17 NOTE — Progress Notes (Signed)
With VS, pt sat between 87-89% on room air, he states he feels fine. 2L O2 applied sating at 92%.  Patient denied any discomfort. Will continue to monitor.

## 2021-12-17 NOTE — Assessment & Plan Note (Signed)
And septic shock due to COVID-19.  Antibiotics not indicated.  Lactate 6.3 on presentation, got 3 L normal saline, lactate resolved, sepsis physiology is improving.

## 2021-12-17 NOTE — Assessment & Plan Note (Signed)
Blood pressure controlled - Continue amlodipine, metoprolol, losartan

## 2021-12-17 NOTE — Assessment & Plan Note (Signed)
-   Hold [name] given COVID

## 2021-12-17 NOTE — Assessment & Plan Note (Signed)
Secondary to A. fib with RVR and COVID.  Still on oxygen.

## 2021-12-17 NOTE — Assessment & Plan Note (Signed)
Due to acquired thrombophilia due to A. fib - Continue Xarelto

## 2021-12-17 NOTE — Assessment & Plan Note (Signed)
-   Continue metoprolol, amiodarone, Xarelto

## 2021-12-17 NOTE — Assessment & Plan Note (Signed)
Heart rate controlled  - Continue metoprolol -continue amiodarone -Continue Xarelto

## 2021-12-17 NOTE — Assessment & Plan Note (Signed)
Creatinine improved back to baseline 0.96

## 2021-12-17 NOTE — Assessment & Plan Note (Signed)
-   Hold dicyclomine

## 2021-12-17 NOTE — Evaluation (Signed)
Physical Therapy Evaluation Patient Details Name: Jason [name] MRN: [id number] DOB: Dec 21, 1946 Today's Date: 12/17/2021  History of Present Illness  Pt admitted 2* SOB with COVID PNA and with hx of CVA, PAF and pacemaker  Clinical Impression  Pt admitted as above and presenting with functional mobility limitations 2* mild ambulatory balance deficits and limited endurance.  This date, pt up to mobilize in room at CGA/SUP level and noted to desat with increased distance walked - down to 83% on RA after walking 120' and with noted increased WOB but still able to converse.  Pt taking ~ 5 minutes to recover to 90%.  Pt eager for dc home.     Recommendations for follow up therapy are one component of a multi-disciplinary discharge planning process, led by the attending physician.  Recommendations may be updated based on patient status, additional functional criteria and insurance authorization.  Follow Up Recommendations No PT follow up    Assistance Recommended at Discharge Intermittent Supervision/Assistance  Functional Status Assessment Patient has had a recent decline in their functional status and demonstrates the ability to make significant improvements in function in a reasonable and predictable amount of time.  Equipment Recommendations  None recommended by PT    Recommendations for Other Services       Precautions / Restrictions Precautions Precautions: Fall Restrictions Weight Bearing Restrictions: No      Mobility  Bed Mobility               General bed mobility comments: Pt up in chair and requests back to same.  CNA reports pt did well getting up from bed earlier this date    Transfers Overall transfer level: Needs assistance   Transfers: Sit to/from Stand Sit to Stand: Min guard;Supervision           General transfer comment: min steady assist on first standing    Ambulation/Gait Ambulation/Gait assistance: Min guard;Supervision Gait Distance (Feet):  120 Feet Assistive device: None Gait Pattern/deviations: Step-through pattern;Decreased step length - right;Decreased step length - left;Shuffle;Trunk flexed;Wide base of support Gait velocity: decr     General Gait Details: Decreased pace with mild general instability partially compensated with increased BOS;  No LOB.  Stairs            Wheelchair Mobility    Modified Rankin (Stroke Patients Only)       Balance Overall balance assessment: Needs assistance Sitting-balance support: No upper extremity supported;Feet supported Sitting balance-Leahy Scale: Good     Standing balance support: No upper extremity supported Standing balance-Leahy Scale: Good                               Pertinent Vitals/Pain Pain Assessment: No/denies pain    Home Living Family/patient expects to be discharged to:: Private residence Living Arrangements: Other relatives (two grandchildren) Available Help at Discharge: Family;Available 24 hours/day Type of Home: House Home Access: Stairs to enter   [employer] of Steps: 1   Home Layout: One level Home Equipment: None Additional Comments: Pt reports grandaughter works but grandson is home most of the time and very helpful    Prior Function Prior Level of Function : Independent/Modified Independent                     Hand Dominance        Extremity/Trunk Assessment   Upper Extremity Assessment Upper Extremity Assessment: Overall WFL for tasks assessed  Lower Extremity Assessment Lower Extremity Assessment: Overall WFL for tasks assessed       Communication   Communication: No difficulties  Cognition Arousal/Alertness: Awake/alert Behavior During Therapy: WFL for tasks assessed/performed Overall Cognitive Status: Within Functional Limits for tasks assessed                                          General Comments      Exercises     Assessment/Plan    PT Assessment  Patient needs continued PT services  PT Problem List Decreased activity tolerance;Decreased balance;Decreased mobility;Decreased knowledge of use of DME       PT Treatment Interventions DME instruction;Gait training;Stair training;Functional mobility training;Therapeutic activities;Therapeutic exercise;Patient/family education;Balance training    PT Goals (Current goals can be found in the Care Plan section)  Acute Rehab PT Goals Patient Stated Goal: Breathe better and go home PT Goal Formulation: With patient Time For Goal Achievement: 12/30/21 Potential to Achieve Goals: Good    Frequency Min 3X/week   Barriers to discharge        Co-evaluation               AM-PAC PT "6 Clicks" Mobility  Outcome Measure Help needed turning from your back to your side while in a flat bed without using bedrails?: None Help needed moving from lying on your back to sitting on the side of a flat bed without using bedrails?: None Help needed moving to and from a bed to a chair (including a wheelchair)?: A Little Help needed standing up from a chair using your arms (e.g., wheelchair or bedside chair)?: A Little Help needed to walk in hospital room?: A Little Help needed climbing 3-5 steps with a railing? : A Little 6 Click Score: 20    End of Session Equipment Utilized During Treatment: Gait belt Activity Tolerance: Patient tolerated treatment well Patient left: in chair;with call bell/phone within reach;with chair alarm set Nurse Communication: Mobility status PT Visit Diagnosis: Unsteadiness on feet (R26.81)    Time: 5638-9373 PT Time Calculation (min) (ACUTE ONLY): 38 min   Charges:   PT Evaluation $PT Eval Low Complexity: 1 Low PT Treatments $Gait Training: 8-22 mins        Debe Coder PT Acute Rehabilitation Services Pager [phone] Office [phone]   Tramane Gorum 12/17/2021, 4:19 PM

## 2021-12-18 DIAGNOSIS — I714 Abdominal aortic aneurysm, without rupture, unspecified: Secondary | ICD-10-CM

## 2021-12-18 LAB — BASIC METABOLIC PANEL
Anion gap: 9 (ref 5–15)
BUN: 33 mg/dL — ABNORMAL HIGH (ref 8–23)
CO2: 18 mmol/L — ABNORMAL LOW (ref 22–32)
Calcium: 8.8 mg/dL — ABNORMAL LOW (ref 8.9–10.3)
Chloride: 115 mmol/L — ABNORMAL HIGH (ref 98–111)
Creatinine, Ser: 0.92 mg/dL (ref 0.61–1.24)
GFR, Estimated: >60 mL/min (ref >60)
Glucose, Bld: 131 mg/dL — ABNORMAL HIGH (ref 70–99)
Potassium: 4.6 mmol/L (ref 3.5–5.1)
Sodium: 142 mmol/L (ref 135–145)

## 2021-12-18 LAB — MAGNESIUM: Magnesium: 2.1 mg/dL (ref 1.7–2.4)

## 2021-12-18 NOTE — Progress Notes (Signed)
°  Progress Note   Patient: Jason [name] DGL:[id number] DOB: [birth year] DOA: 12/15/2021     3 DOS: the patient was seen and examined on 12/18/2021   Brief hospital course: Mr. Granito is a 75 y.o. M with pAF, tachy-brady syndrome s/p PPM, CML on [name], hypothyroidism, HTN, and CKD who p/w 1 week poor appetitie, malaise for one week, progressive to SOB, nausea.       12/20: In the ED, Afib with RVR rate up to 160, started on amiodarone drip. COVID+ started on Decadron and Remdesivir.  Given empiric antibiotics vancomycin, Rocephin. Initially tachypneic in 30s, hypoxic in 80s, required NRB, weaned to 6L  12/21: HR converted, still on O2 12/23: Weaning O2, still desturates to 80s with ambulation      Assessment and Plan * Acute hypoxemic respiratory failure ([place])- (present on admission) COVID-19 virus infection Improving but still with O2.  Due to COVID, exacerbated by afib initially.  Weaned to room air at rest, still desaturates to 80s with ambulation, miproving. - Continue steroids and remdesivir   Atrial fibrillation with RVR (HCC) PAF (paroxysmal atrial fibrillation) ([place])- (present on admission) Heart rate controlled  - Continue metoprolol, amiodarone, Xarelto   Hypomagnesemia- (present on admission) Hypokalemia- (present on admission) Resolved with treatment   CML (chronic myelocytic leukemia) ([place])- (present on admission) - Hold [name] given COVID   Hypothyroidism- (present on admission) - Continue levothyroxine  Hypertension- (present on admission) Blood pressure controlled - Continue amlodipine, metoprolol, losartan  Stage 3a chronic kidney disease (CKD) (HCC) Creatinine improved back to baseline 0.96       Subjective: Patient is gradually feeling better.  He still very winded with exertion.  No fever, confusion.  No hemoptysis.  No sputum.     Objective Vital signs reviewed and notable for hypoxia with exertion General appearance: Elderly adult  male, sitting up in recliner, no acute distress, interactive     HEENT:   Skin:  Cardiac: RRR, slow, no murmurs, no lower extremity Respiratory: Respiratory rate and rhythm, clear without rales or wheezes Abdomen: Abdomen soft no tenderness palpation or guarding, no ascites or distention MSK:  Neuro: Awake and alert, extraocular movements intact, moves upper extremities with generalized weakness but symmetric strength Psych: Attention normal, affect normal, judgment and insight appear to be normal   Data Reviewed: Creatinine stable, BUN rising.  Family Communication:    Disposition: Status is: Inpatient  Remains inpatient appropriate because: He requires ongoing IV remdesivir, supplemental oxygen.  Hopefully we will be able to wean him completely off oxygen by tomorrow and get him home.           Author: Edwin Dada 12/18/2021 12:18 PM  For on call review [email or web address].

## 2021-12-18 NOTE — Assessment & Plan Note (Signed)
-   Hold dicyclomine

## 2021-12-18 NOTE — Progress Notes (Addendum)
SATURATION QUALIFICATIONS: (This note is used to comply with regulatory documentation for home oxygen)  Patient Saturations on Room Air at Rest = 88%  Patient Saturations on Room Air while Ambulating = 83%  Patient Saturations on 2 Liters of oxygen while Ambulating = 93%  Please briefly explain why patient needs home oxygen: Pt unable to maintain SpO2 to adequate level during gait and requires extended seated rest and 2L/min via [place] to recover.  Verner Mould, DPT Acute Rehabilitation Services Office [phone] Pager [phone]

## 2021-12-18 NOTE — Assessment & Plan Note (Signed)
Creatinine improved back to baseline 0.96

## 2021-12-18 NOTE — Assessment & Plan Note (Signed)
Heart rate controlled  - Continue metoprolol, amio, Xarelto

## 2021-12-18 NOTE — Assessment & Plan Note (Signed)
Blood pressure controlled - Continue amlodipine, metoprolol, losartan

## 2021-12-18 NOTE — Care Management Important Message (Signed)
Important Message  Patient Details IM Letter placed in Patients room. Name: Jason [name] MRN: [id number] Date of Birth: 06-13-1946   Medicare Important Message Given:  Yes     Kerin Salen 12/18/2021, 1:25 PM

## 2021-12-18 NOTE — Progress Notes (Signed)
Physical Therapy Treatment Patient Details Name: Jason [name] MRN: [id number] DOB: [birth year] Today's Date: 12/18/2021   History of Present Illness Pt admitted 2* SOB with COVID PNA and with hx of CVA, PAF and pacemaker    PT Comments    Patient making some progress with mobility. At rest pt saturating at 97% on 1L/min. Pt ambulated short distance to bathroom on RA and sats dropped to 88%. Patient recovered to 91% and ambulated increased distance hallway of ~130' with min guard for safety. SpO2 dropped to 83% and pt required 2L,in to recover to 93% with extended rest. Pt completed exercises with flutter and incentive spirometer with cues for correct technique. Anticipate pt will not need follow up for PT once medically ready for discharge home. Acute PT will continue to progress activity tolerance.    Recommendations for follow up therapy are one component of a multi-disciplinary discharge planning process, led by the attending physician.  Recommendations may be updated based on patient status, additional functional criteria and insurance authorization.  Follow Up Recommendations  No PT follow up     Assistance Recommended at Discharge Intermittent Supervision/Assistance  Equipment Recommendations  None recommended by PT    Recommendations for Other Services       Precautions / Restrictions Precautions Precautions: Fall Restrictions Weight Bearing Restrictions: No     Mobility  Bed Mobility Overal bed mobility: Needs Assistance Bed Mobility: Supine to Sit     Supine to sit: Supervision;HOB elevated     General bed mobility comments: supervision/mod independent with extra time and [facility] elevetated    Transfers Overall transfer level: Needs assistance   Transfers: Sit to/from Stand Sit to Stand: Supervision           General transfer comment: supervision for safety with rise from EOB and toilet    Ambulation/Gait Ambulation/Gait assistance: Min  guard;Supervision Gait Distance (Feet): 130 Feet Assistive device: None Gait Pattern/deviations: Step-through pattern;Decreased step length - right;Decreased step length - left;Shuffle;Trunk flexed;Wide base of support Gait velocity: decr     General Gait Details: pt with slow pace, no overt LOB but slight drift Rt/Lt. guarding for safety. pt noted to have increased WOB and reported SOB. SpO2 desat on RA to 83% during gait.   Stairs             Wheelchair Mobility    Modified Rankin (Stroke Patients Only)       Balance Overall balance assessment: Needs assistance Sitting-balance support: No upper extremity supported;Feet supported Sitting balance-Leahy Scale: Good     Standing balance support: No upper extremity supported Standing balance-Leahy Scale: Good                              Cognition Arousal/Alertness: Awake/alert Behavior During Therapy: WFL for tasks assessed/performed Overall Cognitive Status: Within Functional Limits for tasks assessed                                          Exercises      General Comments        Pertinent Vitals/Pain Pain Assessment: No/denies pain    Home Living                          Prior Function  PT Goals (current goals can now be found in the care plan section) Acute Rehab PT Goals Patient Stated Goal: Breathe better and go home PT Goal Formulation: With patient Time For Goal Achievement: 12/30/21 Potential to Achieve Goals: Good Progress towards PT goals: Progressing toward goals    Frequency    Min 3X/week      PT Plan Current plan remains appropriate    Co-evaluation              AM-PAC PT "6 Clicks" Mobility   Outcome Measure  Help needed turning from your back to your side while in a flat bed without using bedrails?: None Help needed moving from lying on your back to sitting on the side of a flat bed without using bedrails?:  None Help needed moving to and from a bed to a chair (including a wheelchair)?: A Little Help needed standing up from a chair using your arms (e.g., wheelchair or bedside chair)?: A Little Help needed to walk in hospital room?: A Little Help needed climbing 3-5 steps with a railing? : A Little 6 Click Score: 20    End of Session Equipment Utilized During Treatment: Gait belt Activity Tolerance: Patient tolerated treatment well Patient left: in chair;with call bell/phone within reach;with chair alarm set Nurse Communication: Mobility status PT Visit Diagnosis: Unsteadiness on feet (R26.81)     Time: 5916-3846 PT Time Calculation (min) (ACUTE ONLY): 34 min  Charges:  $Gait Training: 8-22 mins $Therapeutic Activity: 8-22 mins                     Verner Mould, DPT Acute Rehabilitation Services Office [phone] Pager [phone]    Jacques Navy 12/18/2021, 11:20 AM

## 2021-12-18 NOTE — Assessment & Plan Note (Signed)
Resolved with treatment 

## 2021-12-18 NOTE — Assessment & Plan Note (Signed)
-   Hold [name] given COVID

## 2021-12-18 NOTE — Assessment & Plan Note (Signed)
Continue levothyroxine 

## 2021-12-18 NOTE — Assessment & Plan Note (Signed)
Doubt ACS/myocardial infarction, no further ischemic work-up necessary

## 2021-12-18 NOTE — Assessment & Plan Note (Signed)
Due to acquired thrombophilia due to A. fib - Continue Xarelto

## 2021-12-18 NOTE — Assessment & Plan Note (Signed)
Improving but still with O2.  Due to COVID, exacerbated by afib initially.

## 2021-12-18 NOTE — Assessment & Plan Note (Signed)
Weaned to room air at rest, still desaturates to 80s with ambulation, miproving. - Continue steroids and remdesivir

## 2021-12-18 NOTE — Assessment & Plan Note (Addendum)
Resolved with treatment 

## 2021-12-19 LAB — MAGNESIUM: Magnesium: 1.8 mg/dL (ref 1.7–2.4)

## 2021-12-19 LAB — COMPREHENSIVE METABOLIC PANEL
ALT: 71 U/L — ABNORMAL HIGH (ref 0–44)
AST: 58 U/L — ABNORMAL HIGH (ref 15–41)
Albumin: 3.1 g/dL — ABNORMAL LOW (ref 3.5–5.0)
Alkaline Phosphatase: 92 U/L (ref 38–126)
Anion gap: 6 (ref 5–15)
BUN: 30 mg/dL — ABNORMAL HIGH (ref 8–23)
CO2: 25 mmol/L (ref 22–32)
Calcium: 8.9 mg/dL (ref 8.9–10.3)
Chloride: 112 mmol/L — ABNORMAL HIGH (ref 98–111)
Creatinine, Ser: 0.96 mg/dL (ref 0.61–1.24)
GFR, Estimated: >60 mL/min (ref >60)
Glucose, Bld: 118 mg/dL — ABNORMAL HIGH (ref 70–99)
Potassium: 4.5 mmol/L (ref 3.5–5.1)
Sodium: 143 mmol/L (ref 135–145)
Total Bilirubin: 1.1 mg/dL (ref 0.3–1.2)
Total Protein: 6.4 g/dL — ABNORMAL LOW (ref 6.5–8.1)

## 2021-12-19 LAB — C-REACTIVE PROTEIN: CRP: 1.2 mg/dL — ABNORMAL HIGH (ref <1.0)

## 2021-12-19 MED ORDER — DEXAMETHASONE 6 MG PO TABS: 6.0000 mg | ORAL_TABLET | Freq: Every day | ORAL | 0 refills | Status: DC

## 2021-12-19 NOTE — TOC Transition Note (Signed)
Transition of Care [facility]) - CM/SW Discharge Note  Patient Details  Name: Jason [name] MRN: [id number] Date of Birth: 10/13/1946  [facility]) CM/SW Contact:  Sherie Don, LCSW Phone Number: 12/19/2021, 10:25 AM  Clinical Narrative: Patient will need home O2 due to COVID. CSW made O2 referral to [facility] with [employer]. [employer] to deliver O2 to patient's room. TOC signing off.  Final next level of care: Home/Self Care Barriers to Discharge: Barriers Resolved  Patient Goals and CMS Choice Patient states their goals for this hospitalization and ongoing recovery are:: Return home CMS Medicare.gov Compare Post Acute Care list provided to:: Patient Choice offered to / list presented to : Patient  Discharge Plan and Services        DME Arranged: Oxygen DME Agency: [employer] Date DME Agency Contacted: 12/19/21 Time DME Agency Contacted: [phone] Representative spoke with at DME Agency: Jermaine  Readmission Risk Interventions No flowsheet data found.

## 2021-12-19 NOTE — Discharge Summary (Signed)
Physician Discharge Summary   Patient: Jason [name] MRN: [id number] DOB: @DOB   Admit date:     12/15/2021  Discharge date: 12/19/21  Discharge Physician: Jason [name]   PCP: Jason Lukes, MD   Recommendations at discharge: 1. Follow up with PCP Dr. Charlett [name] in 2 weeks 2. Dr. Charlett [name]: Please check ambulatory O2 and discontinue home O2 if needed 3. Dr. Charlett [name]: Please note lung nodules noted incidentally on CT chest  if not already in surveillance, please repeat CT in 18-24 months       Discharge Diagnoses Principal Problem:   Acute hypoxemic respiratory failure due to COVID Active Problems:   Atrial fibrillation with RVR (HCC)   PAF (paroxysmal atrial fibrillation) (HCC)   Severe sepsis due to COVID-19 virus infection   CML (chronic myelocytic leukemia) (HCC)   Chronic anticoagulation   Hypomagnesemia   Hypokalemia   Hypertension   Hypothyroidism   Stage 3a chronic kidney disease (CKD) (HCC)   Abdominal aortic aneurysm   Demand ischemia (HCC)   IBS (irritable bowel syndrome)   Pulmonary nodule less than 1 cm in diameter with moderate to high risk for malignant neoplasm      Hospital Course   [name] is a 75 y.o. M with pAF, tachy-brady syndrome s/p PPM, CML on [name], hypothyroidism, HTN, and CKD who p/w 1 week poor appetitie, malaise for one week, progressive to SOB, nausea.       In the ED, Afib with RVR rate up to 160, started on amiodarone drip. COVID+ started on Decadron and Remdesivir.  Given empiric antibiotics vancomycin, Rocephin. Initially tachypneic in 30s, hypoxic in 80s, required NRB, weaned to 6L             * Acute hypoxemic respiratory failure ([place])- (present on admission) Patient presented with dyspnea, tachypnea, hypoxia to the 80s.   Due to COVID, exacerbated by rapid heart rate initially.  At time of D/c, patient was improving, able to saturate 95% on room air at rest.  With ambulation, desaturated to 87%. Was d/c'd with  supplemental O2 only with ambulation, likely will be able to wean off in 2-3 weeks.   COVID-19 virus infection Treated with remdesivir and steroids.  Discharged to complete 10 days steroids with dexamethasone.    Septic shock  Presented with WBC 12, RR>22 respiratory failure and lactate 6.3.  Cause COVID-19.  Given IV fluids and lactate trended.  Cause due to COVID, antibiotics not indicated.     Atrial fibrillation with RVR (HCC) PAF (paroxysmal atrial fibrillation) ([place])- (present on admission) Tachy-brady syndrome ([place])- (present on admission) Presented with HRs 140s.  Treated with fluids, amiodarone drip.  Heart rate converted, resumed home rate control agents, Xarelto.    Hypomagnesemia- (present on admission) Hypokalemia- (present on admission) Resolved with treatment  Chronic anticoagulation Due to acquired thrombophilia due to A. fib     Demand ischemia ([place])- (present on admission) Doubt ACS/myocardial infarction, no further ischemic work-up necessary  Abdominal aortic aneurysm- (present on admission) THis is in surveillance by [name], last US aorta was in Sep of this year.  Pulmonary nodule less than 1 cm in diameter with moderate to high risk for malignant neoplasm Incidental finding, follow-up as an outpatient         Procedures performed: CT chest and abdomen pelvis  Disposition: Home Diet recommendation: Cardiac diet  DISCHARGE MEDICATION: Allergies as of 12/19/2021   No Known Allergies      Medication List  STOP taking these medications    dicyclomine 20 MG tablet Commonly known as: BENTYL       TAKE these medications    amiodarone 200 MG tablet Commonly known as: PACERONE Take 1/2 (one-half) tablet by mouth once daily What changed:  how much to take how to take this when to take this additional instructions   amLODipine 5 MG tablet Commonly known as: NORVASC Take 1 tablet by mouth once daily   aspirin EC 81 MG  tablet Take 81 mg by mouth daily as needed for mild pain.   atorvastatin 40 MG tablet Commonly known as: LIPITOR Take 1 tablet (40 mg total) by mouth daily.   dexamethasone 6 MG tablet Commonly known as: DECADRON Take 1 tablet (6 mg total) by mouth daily.   ferrous sulfate 325 (65 FE) MG EC tablet Take 1 tablet (325 mg total) by mouth 2 (two) times daily. What changed: when to take this   fluticasone 50 MCG/ACT nasal spray Commonly known as: FLONASE Place 2 sprays into both nostrils daily.   imatinib 400 MG tablet Commonly known as: [name] TAKE 1 TABLET (400MG  TOTAL) BY MOUTH DAILY. TAKE WITH A MEAL AND LARGE GLASS OF WATER. What changed: See the new instructions.   levothyroxine 25 MCG tablet Commonly known as: SYNTHROID Take 1 tablet (25 mcg total) by mouth daily before breakfast. What changed: Another medication with the same name was removed. Continue taking this medication, and follow the directions you see here.   losartan 100 MG tablet Commonly known as: COZAAR Take 1 tablet (100 mg total) by mouth daily.   magnesium oxide 400 MG tablet Commonly known as: MAG-OX Take 1 tablet (400 mg total) by mouth daily. Except Tuesday and Saturday take it BID. What changed: additional instructions   meclizine 25 MG tablet Commonly known as: ANTIVERT TAKE 1 TABLET THREE TIMES DAILY   metoprolol succinate 100 MG 24 hr tablet Commonly known as: Toprol XL Take 1 tablet (100 mg total) by mouth daily.   nitroGLYCERIN 0.4 MG SL tablet Commonly known as: NITROSTAT Place 1 tablet (0.4 mg total) under the tongue every 5 (five) minutes as needed for chest pain.   omeprazole 40 MG capsule Commonly known as: PRILOSEC TAKE 1 CAPSULE EVERY DAY (NEED OFFICE VISIT FOR GREATER QUANTITIES/REFILLS) What changed:  how much to take when to take this additional instructions   ondansetron 4 MG tablet Commonly known as: ZOFRAN Take 1 tablet (4 mg total) by mouth 2 (two) times daily as  needed for nausea or vomiting.   potassium chloride SA 20 MEQ tablet Commonly known as: KLOR-CON M Take 1 tablet (20 mEq total) by mouth 3 (three) times daily.   Probiotic 250 MG Caps Take 250 mg by mouth daily.   vitamin B-12 500 MCG tablet Commonly known as: CYANOCOBALAMIN Take 500 mcg by mouth daily.   Vitamin D (Ergocalciferol) 1.25 MG (50000 UNIT) Caps capsule Commonly known as: DRISDOL Take 1 capsule (50,000 Units total) by mouth every 7 (seven) days.   Xarelto 20 MG Tabs tablet Generic drug: rivaroxaban TAKE 1 TABLET BY MOUTH ONCE DAILY WITH SUPPER What changed:  how much to take how to take this when to take this               Durable Medical Equipment  (From admission, onward)           Start     Ordered   12/19/21 0957  DME Oxygen  Once  Question Answer Comment  Length of Need 6 Months   Mode or (Route) Nasal cannula   Liters per Minute 2   Frequency Continuous (stationary and portable oxygen unit needed)   Oxygen delivery system Gas      12/19/21 0958            Follow-up Information     Jason Lukes, MD. Schedule an appointment as soon as possible for a visit in 2 week(s).   Specialty: Family Medicine Contact information: [address] [place] [address] [phone]                Discharge Instructions     Discharge instructions   Complete by: As directed    You were admitted for coronavirus (Also known as COVID-19) You also had out of control atrial fibrillation.   You were treated with an anti-virus medicine ("remdesivir") and an anti-inflammatory (a "steroid") while you were here.  You completed the course of the anti-virus medicine, remdesivir You should finish the course of steroids by taking dexamethasone once daily for 5 more days    IMPORTANT: you should hold your [name] for the next week. When you are done with the steroids, you may resume your [name]   Use the oxygen  any time you are walking around until you see your primary care doctor. You do not have to use the oxygen if you are sitting down, because when you are sitting or lying, your oxygen level is normal. The purpose of the  oxygen is to keep your oxygen level at 88% or above Use a pulse oximeter to measure your oxygen level.   You should purchase a pulse oximeter at your pharmacy. This is a device that you put on your finger to measure your oxygen level.  They are available at any pharmacy. Use it to check your oxygen level twice daily until you see your primary care doctor. If your oxygen level is ever LESS than 88% and doesn't get better, you should call your primary care doctor immediately.   HOW LONG TO REMAIN IN QUARANTINE: There is no absolutely correct answer to this and so our best answer is to be on the cautious side.  Based on what we know of the virus, you should isolate strictly until 21 days from your first symptoms.  Until you end your quarantine: If you have anyone in the home who has NOT had coronavirus:    -do not be in the same room with them until your self isolation is over    -if you MUST be in the same room, make sure you wear a mask and have them wear a mask and safety glasses (if available)    -clean all hard surfaces (counters, doors, tables) twice a day    -use a separate bathroom at all times   Increase activity slowly   Complete by: As directed    MyChart COVID-19 home monitoring program   Complete by: Dec 19, 2021    Is the patient willing to use the [place] for home monitoring?: No         Discharge Exam: Filed Weights   12/15/21 1017 12/15/21 1643  Weight: 71.7 kg 70.9 kg   General appearance: edlerly adult male, ambulating in room, interactive     HEENT: anicteric, conjunctiva pink   Skin:  Cardiac: Irregular, normal rate, no murmurs, no LE edema  Respiratory: Normal respiratory effort without rales or wheezing Abdomen: Abdomen  soft, no  tenderness to palpation  MSK:  Neuro: Awake and alert, gait normal, speech fluent   Psych: Attention normal, affect normal, judgment and insight normal for age   Condition at discharge: fair  The results of significant diagnostics from this hospitalization (including imaging, microbiology, ancillary and laboratory) are listed below for reference.   Imaging Studies: CT CHEST ABDOMEN PELVIS W CONTRAST  Result Date: 12/15/2021 CLINICAL DATA:  Sepsis, abdominal pain, shortness of breath EXAM: CT CHEST, ABDOMEN, AND PELVIS WITH CONTRAST TECHNIQUE: Multidetector CT imaging of the chest, abdomen and pelvis was performed following the standard protocol during bolus administration of intravenous contrast. CONTRAST:  137mL OMNIPAQUE IOHEXOL 300 MG/ML  SOLN COMPARISON:  CT chest abdomen pelvis angiogram, 07/31/2021 FINDINGS: CT CHEST FINDINGS Cardiovascular: Aortic atherosclerosis. Left chest multi lead pacer. Normal heart size. Three-vessel coronary artery calcifications. No pericardial effusion. Mediastinum/Nodes: Unchanged prominent mediastinal lymph nodes. Thyroid gland, trachea, and esophagus demonstrate no significant findings. Lungs/Pleura: Moderate centrilobular and paraseptal emphysema. Redemonstrated fibrotic interstitial lung disease, with a bibasilar predominant pattern of irregular peripheral interstitial opacity, subpleural bronchiolectasis, and thin walled cysts, unchanged compared to prior examination. Unchanged small pulmonary nodules, largest in the anterior left upper lobe and irregular nodule measuring 0.7 cm (series 4, image 65). No pleural effusion or pneumothorax. Musculoskeletal: No chest wall mass or suspicious bone lesions identified. CT ABDOMEN PELVIS FINDINGS Hepatobiliary: No focal liver abnormality is seen. Status post cholecystectomy. No biliary dilatation. Pancreas: Unremarkable. No pancreatic ductal dilatation or surrounding inflammatory changes. Spleen: Normal in size without  significant abnormality. Adrenals/Urinary Tract: Adrenal glands are unremarkable. Kidneys are normal, without renal calculi, solid lesion, or hydronephrosis. Bladder is unremarkable. Stomach/Bowel: Stomach is within normal limits. Appendix appears normal. Mild wall thickening of the decompressed cecum (series 5, image 73). Vascular/Lymphatic: Aortic atherosclerosis. Redemonstrated aneurysm of the infrarenal abdominal aorta, measuring 4.9 x 4.6 cm. There has been an increase in burden of mural thrombus (series 2, image 79). No enlarged abdominal or pelvic lymph nodes. Reproductive: No mass or other abnormality. Other: No abdominal wall hernia or abnormality. No abdominopelvic ascites. Musculoskeletal: No acute or significant osseous findings. IMPRESSION: 1. Mild wall thickening of the decompressed cecum, suggestive of nonspecific infectious, inflammatory, or ischemic colitis. 2. No other acute findings to suggest etiology of sepsis. 3. Redemonstrated aneurysm of the infrarenal abdominal aorta, measuring 4.9 x 4.6 cm. There has been an increase in burden of mural thrombus. Recommend follow-up CT/MR every 6 months and vascular consultation. This recommendation follows ACR consensus guidelines: White Paper of the ACR Incidental Findings Committee II on Vascular Findings. J Am Coll Radiol 2013; 10:789-794. 4. Redemonstrated fibrotic interstitial lung disease, with a bibasilar predominant pattern of irregular peripheral interstitial opacity, subpleural bronchiolectasis, and thin walled cysts, unchanged compared to prior examination. Findings remain consistent with an "alternative diagnosis" pattern by ATS pulmonary fibrosis criteria. 5. Unchanged small pulmonary nodules, largest in the anterior left upper lobe measuring 0.7 cm. Future CT at 18-24 months (from baseline dated 07/31/2021 scan) is considered optional for low-risk patients, but is recommended for high-risk patients. This recommendation follows the consensus  statement: Guidelines for Management of Incidental Pulmonary Nodules Detected on CT Images: From the Fleischner Society 2017; Radiology 2017; 284:228-243. 6. Coronary artery disease. 7. Emphysema. Aortic Atherosclerosis (ICD10-I70.0) and Emphysema (ICD10-J43.9). Electronically Signed   By: Delanna Ahmadi M.D.   On: 12/15/2021 12:52   DG Chest Portable 1 View  Result Date: 12/15/2021 CLINICAL DATA:  Cough, shortness of breath EXAM: PORTABLE CHEST 1 VIEW COMPARISON:  Chest radiograph 07/31/2021 FINDINGS: The left chest wall cardiac device and associated leads are stable. The cardiomediastinal silhouette is stable. There are patchy opacities in the right base. There is no other focal airspace opacity. There is no pulmonary edema. There is no pleural effusion or pneumothorax. There is no acute osseous abnormality. IMPRESSION: Patchy opacities in the right base could reflect developing infection in the correct clinical setting. Electronically Signed   By: Valetta Mole M.D.   On: 12/15/2021 11:07    Microbiology: Results for orders placed or performed during the hospital encounter of 12/15/21  Resp Panel by RT-PCR (Flu A&B, Covid) Nasopharyngeal Swab     Status: Abnormal   Collection Time: 12/15/21 11:21 AM   Specimen: Nasopharyngeal Swab; Nasopharyngeal(NP) swabs in vial transport medium  Result Value Ref Range Status   SARS Coronavirus 2 by RT PCR POSITIVE (A) NEGATIVE Final    Comment: (NOTE) SARS-CoV-2 target nucleic acids are DETECTED.  The SARS-CoV-2 RNA is generally detectable in upper respiratory specimens during the acute phase of infection. Positive results are indicative of the presence of the identified virus, but do not rule out bacterial infection or co-infection with other pathogens not detected by the test. Clinical correlation with patient history and other diagnostic information is necessary to determine patient infection status. The expected result is Negative.  Fact Sheet for  Patients: EntrepreneurPulse.com.au  Fact Sheet for Healthcare Providers: IncredibleEmployment.be  This test is not yet approved or cleared by the [place] FDA and  has been authorized for detection and/or diagnosis of SARS-CoV-2 by FDA under an Emergency Use Authorization (EUA).  This EUA will remain in effect (meaning this test can be used) for the duration of  the COVID-19 declaration under Section 564(b)(1) of the A ct, 21 U.S.C. section 360bbb-3(b)(1), unless the authorization is terminated or revoked sooner.     Influenza A by PCR NEGATIVE NEGATIVE Final   Influenza B by PCR NEGATIVE NEGATIVE Final    Comment: (NOTE) The Xpert Xpress SARS-CoV-2/FLU/RSV plus assay is intended as an aid in the diagnosis of influenza from Nasopharyngeal swab specimens and should not be used as a sole basis for treatment. Nasal washings and aspirates are unacceptable for Xpert Xpress SARS-CoV-2/FLU/RSV testing.  Fact Sheet for Patients: EntrepreneurPulse.com.au  Fact Sheet for Healthcare Providers: IncredibleEmployment.be  This test is not yet approved or cleared by the [place] FDA and has been authorized for detection and/or diagnosis of SARS-CoV-2 by FDA under an Emergency Use Authorization (EUA). This EUA will remain in effect (meaning this test can be used) for the duration of the COVID-19 declaration under Section 564(b)(1) of the Act, 21 U.S.C. section 360bbb-3(b)(1), unless the authorization is terminated or revoked.  Performed at [facility], [address] [place], [place] [address]   MRSA Next Gen by PCR, Nasal     Status: None   Collection Time: 12/15/21  4:53 PM   Specimen: Nasal Mucosa; Nasal Swab  Result Value Ref Range Status   MRSA by PCR Next Gen NOT DETECTED NOT DETECTED Final    Comment: (NOTE) The GeneXpert MRSA Assay (FDA approved for NASAL specimens only), is one  component of a comprehensive MRSA colonization surveillance program. It is not intended to diagnose MRSA infection nor to guide or monitor treatment for MRSA infections. Test performance is not FDA approved in patients less than 30 years old. Performed at [facility], [place] [address] [place], [place] [address]     Labs: CBC:  Recent Labs  Lab 12/15/21 1050 12/16/21 0240  WBC 12.1* 5.4  NEUTROABS 10.0* 4.8  HGB 16.0 12.8*  HCT 47.3 39.0  MCV 92.7 93.8  PLT 223 944   Basic Metabolic Panel: Recent Labs  Lab 12/15/21 1050 12/16/21 0240 12/17/21 0235 12/18/21 0537 12/19/21 0526  NA 142 141 139 142 143  K 3.0* 3.0* 3.4* 4.6 4.5  CL 103 109 112* 115* 112*  CO2 20* 20* 22 18* 25  GLUCOSE 100* 164* 152* 131* 118*  BUN 21 24* 27* 33* 30*  CREATININE 1.35* 0.96 1.10 0.92 0.96  CALCIUM 8.8* 8.1* 8.3* 8.8* 8.9  MG 1.4* 1.9 1.8 2.1 1.8  PHOS  --  2.5  --   --   --    Liver Function Tests: Recent Labs  Lab 12/15/21 1050 12/16/21 0240 12/19/21 0526  AST 36 25 58*  ALT 13 11 71*  ALKPHOS 90 74 92  BILITOT 1.0 0.9 1.1  PROT 7.6 6.1* 6.4*  ALBUMIN 3.6 2.9* 3.1*   CBG: No results for input(s): GLUCAP in the last 168 hours.  Discharge time spent: greater than 30 minutes.  Signed:  Edwin Dada MD.  [facility] 12/19/2021

## 2021-12-19 NOTE — Plan of Care (Signed)

## 2021-12-21 ENCOUNTER — Other Ambulatory Visit: Payer: Self-pay | Admitting: Family Medicine

## 2021-12-22 ENCOUNTER — Telehealth: Payer: Self-pay

## 2021-12-22 NOTE — Telephone Encounter (Signed)
Transition Care Management Unsuccessful Follow-up Telephone Call  Date of discharge and from where:  12/19/2021-[place]  Attempts:  1st Attempt  Reason for unsuccessful TCM follow-up call:  No answer/busy

## 2021-12-24 NOTE — Telephone Encounter (Signed)
Transition Care Management Follow-up Telephone Call Date of discharge and from where: 12/19/2021-[place] How have you been since you were released from the hospital? Patient states-Doing ok just taking it slow. Any questions or concerns? No  Items Reviewed: Did the pt receive and understand the discharge instructions provided? Yes  Medications obtained and verified? Yes  Other? Yes  Any new allergies since your discharge? No  Dietary orders reviewed? Yes Do you have support at home? Yes   Home Care and Equipment/Supplies: Were home health services ordered? no If so, what is the name of the agency? N/a  Has the agency set up a time to come to the patient's home? not applicable Were any new equipment or medical supplies ordered?  Yes: Oxygen What is the name of the medical supply agency? [employer] Were you able to get the supplies/equipment? yes Do you have any questions related to the use of the equipment or supplies? No  Functional Questionnaire: (I = Independent and D = Dependent) ADLs:  i  Bathing/Dressing- i  Meal Prep- i  Eating- i  Maintaining continence- i  Transferring/Ambulation- i  Managing Meds- i  Follow up appointments reviewed:  PCP Hospital f/u appt confirmed? Yes  Scheduled to see Mackie Pai on 12/31/2021 @ 10:20. [facility] f/u appt confirmed?  N/a   Are transportation arrangements needed? No  If their condition worsens, is the pt aware to call PCP or go to the Emergency Dept.? Yes Was the patient provided with contact information for the PCP's office or ED? Yes Was to pt encouraged to call back with questions or concerns? Yes

## 2021-12-26 NOTE — Progress Notes (Deleted)
HPI:FU coronary artery disease and atrial fibrillation. Patient had stents placed at [facility] in 2000. Records are not available. Patient seen preoperatively at [facility] on September 01 2012 and electrocardiogram showed atrial fibrillation. Patient has been maintained on anticoagulation. Monitor April 2016 showed paroxysmal atrial fibrillation and Toprol increased. Nuclear study April 2017 showed ejection fraction 43%, inferior and lateral infarct but no ischemia. Has had pacemaker placed secondary to tachybradycardia syndrome. Echocardiogram September 2022 showed ejection fraction 45 to 50%, mild to moderate aortic insufficiency.  Abdominal ultrasound September 2022 showed 5.1 cm abdominal aortic aneurysm which had increased from 4.5 cm September 2021.  Patient was referred to vascular surgery.  CTA December 2022 showed 4.9 x 4.6 cm abdominal aortic aneurysm with increased burden of mural thrombus.  Also with interstitial lung disease and pulmonary nodules noted.  Recently discharged following admission for COVID.  Was treated with amiodarone for atrial fibrillation with rapid ventricular response.  Since last seen,   Current Outpatient Medications  Medication Sig Dispense Refill   amiodarone (PACERONE) 200 MG tablet Take 1/2 (one-half) tablet by mouth once daily (Patient taking differently: Take 100 mg by mouth daily.) 45 tablet 3   amLODipine (NORVASC) 5 MG tablet Take 1 tablet by mouth once daily (Patient taking differently: Take 5 mg by mouth daily.) 90 tablet 0   aspirin EC 81 MG tablet Take 81 mg by mouth daily as needed for mild pain.     atorvastatin (LIPITOR) 40 MG tablet Take 1 tablet (40 mg total) by mouth daily. 90 tablet 3   dexamethasone (DECADRON) 6 MG tablet Take 1 tablet (6 mg total) by mouth daily. 5 tablet 0   ferrous sulfate 325 (65 FE) MG EC tablet Take 1 tablet (325 mg total) by mouth 2 (two) times daily. (Patient taking differently: Take 325 mg by mouth  daily.) 60 tablet 11   fluticasone (FLONASE) 50 MCG/ACT nasal spray Place 2 sprays into both nostrils daily. 48 g 3   imatinib ([name]) 400 MG tablet TAKE 1 TABLET (400MG  TOTAL) BY MOUTH DAILY. TAKE WITH A MEAL AND LARGE GLASS OF WATER. (Patient taking differently: Take 400 mg by mouth daily.) 30 tablet 6   levothyroxine (SYNTHROID) 25 MCG tablet Take 1 tablet (25 mcg total) by mouth daily before breakfast. 30 tablet 2   losartan (COZAAR) 100 MG tablet Take 1 tablet (100 mg total) by mouth daily. 90 tablet 1   magnesium oxide (MAG-OX) 400 MG tablet Take 1 tablet (400 mg total) by mouth daily. Except Tuesday and Saturday take it BID. (Patient taking differently: Take 400 mg by mouth daily.) 30 tablet 2   meclizine (ANTIVERT) 25 MG tablet TAKE 1 TABLET THREE TIMES DAILY (Patient taking differently: Take 25 mg by mouth 3 (three) times daily.) 90 tablet 0   metoprolol succinate (TOPROL XL) 100 MG 24 hr tablet Take 1 tablet (100 mg total) by mouth daily. 90 tablet 1   nitroGLYCERIN (NITROSTAT) 0.4 MG SL tablet Place 1 tablet (0.4 mg total) under the tongue every 5 (five) minutes as needed for chest pain. 25 tablet 1   omeprazole (PRILOSEC) 40 MG capsule TAKE 1 CAPSULE EVERY DAY (NEED OFFICE VISIT FOR GREATER QUANTITIES/REFILLS) (Patient taking differently: 40 mg daily.) 90 capsule 0   ondansetron (ZOFRAN) 4 MG tablet Take 1 tablet (4 mg total) by mouth 2 (two) times daily as needed for nausea or vomiting. 180 tablet 1   potassium chloride SA (KLOR-CON) 20 MEQ tablet  Take 1 tablet (20 mEq total) by mouth 3 (three) times daily. (Patient not taking: Reported on 12/15/2021) 15 tablet 0   rivaroxaban (XARELTO) 20 MG TABS tablet TAKE 1 TABLET BY MOUTH ONCE DAILY WITH SUPPER (Patient taking differently: 20 mg daily.) 90 tablet 1   Saccharomyces boulardii (PROBIOTIC) 250 MG CAPS Take 250 mg by mouth daily. 90 capsule 1   vitamin B-12 (CYANOCOBALAMIN) 500 MCG tablet Take 500 mcg by mouth daily.     Vitamin D,  Ergocalciferol, (DRISDOL) 1.25 MG (50000 UNIT) CAPS capsule Take 1 capsule (50,000 Units total) by mouth every 7 (seven) days. 4 capsule 4   Current Facility-Administered Medications  Medication Dose Route Frequency Provider Last Rate Last Admin   cyanocobalamin ((VITAMIN B-12)) injection 1,000 mcg  1,000 mcg Intramuscular Q30 days Mosie Lukes, MD   1,000 mcg at 07/15/20 7793     Past Medical History:  Diagnosis Date   Anemia 05/08/2015   CML (chronic myelocytic leukemia) ([place]) 12/16/2011   Coronary heart disease    CVA (cerebrovascular accident) ([place])    GERD (gastroesophageal reflux disease)    Grief reaction 03/18/2014   H/O tobacco use, presenting hazards to health 02/24/2012   No cigarettes since pacemaker placement. Encouraged ongoing efforts    History of chicken pox    History of kidney stones    Hyperlipidemia    Hypertension    IBS (irritable bowel syndrome) 09/20/2013   Medicare annual wellness visit, subsequent 03/23/2014   PAF (paroxysmal atrial fibrillation) (HCC)    Symptomatic bradycardia    a. symptomatic bradycardia due to sinus node dysfunction s/p RA and RV Medtronic pacemaker 04/15/16    Past Surgical History:  Procedure Laterality Date   CATARACT EXTRACTION     CHOLECYSTECTOMY  2003   CORONARY ANGIOPLASTY WITH STENT PLACEMENT  2000   EP IMPLANTABLE DEVICE N/A 04/15/2016   Procedure: Pacemaker Implant;  Surgeon: Evans Lance, MD; Medtronic (serial number [id number]) pacemaker; Laterality: Left    Social History   Socioeconomic History   Marital status: Widowed    Spouse name: Not on file   Number of children: 3   Years of education: Not on file   Highest education level: Not on file  Occupational History   Occupation: Retired    Comment: Retired  Tobacco Use   Smoking status: Former    Packs/day: 0.25    Years: 40.00    Pack years: 10.00    Types: Cigarettes    Start date: 10/30/1967    Quit date: 04/14/2016    Years since quitting: 5.7    Smokeless tobacco: Never  Vaping Use   Vaping Use: Never used  Substance and Sexual Activity   Alcohol use: No    Alcohol/week: 0.0 standard drinks    Comment: Rare   Drug use: No   Sexual activity: Never    Comment: wife died in 02/04/2023 married 6 years, grandson livew with patient  Other Topics Concern   Not on file  Social History Narrative   Not on file   Social Determinants of Health   Financial Resource Strain: Low Risk    Difficulty of Paying Living Expenses: Not hard at all  Food Insecurity: No Food Insecurity   Worried About Charity fundraiser in the Last Year: Never true   [place] in the Last Year: Never true  Transportation Needs: No Transportation Needs   Lack of Transportation (Medical): No   Lack of Transportation (Non-Medical): No  Physical Activity: Inactive   Days of Exercise per Week: 0 days   Minutes of Exercise per Session: 0 min  Stress: No Stress Concern Present   Feeling of Stress : Not at all  Social Connections: Moderately Isolated   Frequency of Communication with Friends and Family: More than three times a week   Frequency of Social Gatherings with Friends and Family: Once a week   Attends Religious Services: 1 to 4 times per year   Active Member of [employer] or Organizations: No   Attends Archivist Meetings: Never   Marital Status: Widowed  Human resources officer Violence: Not At Risk   Fear of Current or Ex-Partner: No   Emotionally Abused: No   Physically Abused: No   Sexually Abused: No    Family History  Problem Relation Age of Onset   Arthritis Mother    Hypertension Mother    Heart disease Mother        Rheumatic fever   Rheumatic fever Mother    Other Mother        brain tumor   Cancer Mother        leukemia   Hypertension Father    Alcohol abuse Father    Diabetes Son    Colon cancer Neg Hx    Breast cancer Neg Hx    Prostate cancer Neg Hx     ROS: no fevers or chills, productive cough, hemoptysis, dysphasia,  odynophagia, melena, hematochezia, dysuria, hematuria, rash, seizure activity, orthopnea, PND, pedal edema, claudication. Remaining systems are negative.  Physical Exam: Well-developed well-nourished in no acute distress.  Skin is warm and dry.  HEENT is normal.  Neck is supple.  Chest is clear to auscultation with normal expansion.  Cardiovascular exam is regular rate and rhythm.  Abdominal exam nontender or distended. No masses palpated. Extremities show no edema. neuro grossly intact  ECG- personally reviewed  A/P  1 coronary artery disease-patient denies chest pain.  Continue statin.  He is not on aspirin given need for chronic anticoagulation.  2 abdominal aortic aneurysm-needs follow-up with vascular surgery.  We will arrange.  3 hypertension-blood pressure controlled.  Continue beta-blocker at present dose.  4 paroxysmal atrial fibrillation-  5 history of pacemaker-followed by electrophysiology.  6 hyperlipidemia-continue statin.  7 mild to moderate aortic insufficiency-plan follow-up echocardiogram September 2023.  Kirk Ruths, MD

## 2021-12-31 ENCOUNTER — Ambulatory Visit (INDEPENDENT_AMBULATORY_CARE_PROVIDER_SITE_OTHER): Payer: Commercial Managed Care - HMO | Admitting: Medical

## 2021-12-31 ENCOUNTER — Telehealth: Payer: Self-pay | Admitting: Medical

## 2021-12-31 VITALS — BP 122/52 | HR 70 | Resp 18 | Ht 68.0 in | Wt 176.8 lb

## 2021-12-31 DIAGNOSIS — N289 Disorder of kidney and ureter, unspecified: Secondary | ICD-10-CM

## 2021-12-31 DIAGNOSIS — Z87891 Personal history of nicotine dependence: Secondary | ICD-10-CM | POA: Diagnosis not present

## 2021-12-31 DIAGNOSIS — J449 Chronic obstructive pulmonary disease, unspecified: Secondary | ICD-10-CM

## 2021-12-31 DIAGNOSIS — R911 Solitary pulmonary nodule: Secondary | ICD-10-CM

## 2021-12-31 DIAGNOSIS — J9601 Acute respiratory failure with hypoxia: Secondary | ICD-10-CM | POA: Diagnosis not present

## 2021-12-31 DIAGNOSIS — U071 COVID-19: Secondary | ICD-10-CM

## 2021-12-31 MED ORDER — ALBUTEROL SULFATE HFA 108 (90 BASE) MCG/ACT IN AERS: 2.0000 | INHALATION_SPRAY | Freq: Four times a day (QID) | RESPIRATORY_TRACT | 0 refills | Status: AC | PRN

## 2021-12-31 NOTE — Patient Instructions (Addendum)
Recent * Acute hypoxemic respiratory failure with covid. Complicated med history below.  Atrial fibrillation with RVR (HCC)   PAF (paroxysmal atrial fibrillation) (HCC)   Severe sepsis due to COVID-19 virus infection   CML (chronic myelocytic leukemia) (HCC)   Chronic anticoagulation   Hypomagnesemia   Hypokalemia   Hypertension   Hypothyroidism   Stage 3a chronic kidney disease (CKD) (HCC)   Abdominal aortic aneurysm   Demand ischemia (HCC)   IBS (irritable bowel syndrome)   Pulmonary nodule less than 1 cm in diameter with moderate to high risk for malignant neoplasm   Had pneumonia on xray. Thankfully much improved since DC from hospital. Continuing to improve daily.  Will get cbc, cmp and cxr for follow up work up.  Will make albuterol inhaler available if you get wheezing or sob. You have 02 to use at home if needed.  Will send Dr. Charlett Blake note regarding need to repeat ct to evaluate/follow lung nodule.  Follow up in 1 month pcp if possible. Or follow up sooner if needed.

## 2021-12-31 NOTE — Telephone Encounter (Signed)
°  Dr. Charlett Blake, I saw pt for hospital follow up. He is stable post covid but ct scan below showed nodule to follow. So wanted you to be aware.  Thanks, Jessen Siegman Unchanged small pulmonary nodules, largest in the anterior left upper lobe measuring 0.7 cm. Future CT at 18-24 months (from baseline dated 07/31/2021 scan) is considered optional for low-risk patients, but is recommended for high-risk patients. This recommendation follows the consensus statement:

## 2021-12-31 NOTE — Progress Notes (Signed)
Subjective:    Patient ID: Jason [name], male    DOB: Sep 06, 1946, 76 y.o.   MRN: [id number]  HPI Follow up from hospitalization fo covid.  Admit date:     12/15/2021  Discharge date: 12/19/21  Discharge Physician: Edwin Dada    PCP: Mosie Lukes, MD    Recommendations at discharge: 1. Follow up with PCP Dr. Charlett Blake in 2 weeks 2. Dr. Charlett Blake: Please check ambulatory O2 and discontinue home O2 if needed 3. Dr. Charlett Blake: Please note lung nodules noted incidentally on CT chest  if not already in surveillance, please repeat CT in 18-24 months  Discharge Diagnoses Principal Problem:   Acute hypoxemic respiratory failure due to COVID Active Problems:   Atrial fibrillation with RVR (HCC)   PAF (paroxysmal atrial fibrillation) (HCC)   Severe sepsis due to COVID-19 virus infection   CML (chronic myelocytic leukemia) (HCC)   Chronic anticoagulation   Hypomagnesemia   Hypokalemia   Hypertension   Hypothyroidism   Stage 3a chronic kidney disease (CKD) (HCC)   Abdominal aortic aneurysm   Demand ischemia (HCC)   IBS (irritable bowel syndrome)   Pulmonary nodule less than 1 cm in diameter with moderate to high risk for malignant neoplasm  "Hospital Course   Mr. Jason [name] is a 76 y.o. M with pAF, tachy-brady syndrome s/p PPM, CML on [name], hypothyroidism, HTN, and CKD who p/w 1 week poor appetitie, malaise for one week, progressive to SOB, nausea.         In the ED, Afib with RVR rate up to 160, started on amiodarone drip. COVID+ started on Decadron and Remdesivir.  Given empiric antibiotics vancomycin, Rocephin. Initially tachypneic in 30s, hypoxic in 80s, required NRB, weaned to 6L   * Acute hypoxemic respiratory failure ([place])- (present on admission) Patient presented with dyspnea, tachypnea, hypoxia to the 80s.   Due to COVID, exacerbated by rapid heart rate initially.   At time of D/c, patient was improving, able to saturate 95% on room air at rest.  With ambulation,  desaturated to 87%. Was d/c'd with supplemental O2 only with ambulation, likely will be able to wean off in 2-3 weeks.     COVID-19 virus infection Treated with remdesivir and steroids.  Discharged to complete 10 days steroids with dexamethasone.     Septic shock  Presented with WBC 12, RR>22 respiratory failure and lactate 6.3.  Cause COVID-19.  Given IV fluids and lactate trended.  Cause due to COVID, antibiotics not indicated.       Atrial fibrillation with RVR (HCC) PAF (paroxysmal atrial fibrillation) ([name])- (present on admission) Tachy-brady syndrome ([name])- (present on admission) Presented with HRs 140s.  Treated with fluids, amiodarone drip.  Heart rate converted, resumed home rate control agents, Xarelto.     Hypomagnesemia- (present on admission) Hypokalemia- (present on admission) Resolved with treatment   Chronic anticoagulation Due to acquired thrombophilia due to A. fib       Demand ischemia ([name])- (present on admission) Doubt ACS/myocardial infarction, no further ischemic work-up necessary   Abdominal aortic aneurysm- (present on admission) THis is in surveillance by [name], last US aorta was in Sep of this year.   Pulmonary nodule less than 1 cm in diameter with moderate to high risk for malignant neoplasm Incidental finding, follow-up as an outpatient"    Pt tells me he is progressively feeling better each day. Energy improving. He only felt sob one time since dc. That was days ago and at night. Has  not had to use oxygen except for one time.  No productive cough. Stats random/rare sporadic cough. No fever, no chills and sweats. His taste is back and getting better appetitie.   Hx of smoking. Stopped in 2017. Ct showed. Unchanged small pulmonary nodules, largest in the anterior left upper lobe measuring 0.7 cm. Future CT at 18-24 months (from baseline dated 07/31/2021 scan) is considered optional for low-risk patients, but is recommended for high-risk  patients. This recommendation follows the consensus statement: Guidelines for Management of Incidental Pulmonary Nodules Detected on CT Images:  Pt never got vaccinated against covid.   Pt just finished dexamethasone yesterday.  Review of Systems  Constitutional:  Negative for chills, fatigue and fever.       Energy improving.  HENT:  Negative for congestion.   Respiratory:  Negative for cough, shortness of breath and wheezing.        See hpi. Much improved.  Cardiovascular:  Negative for chest pain and palpitations.  Gastrointestinal:  Negative for abdominal pain, diarrhea and nausea.  Genitourinary:  Negative for dysuria and flank pain.  Musculoskeletal:  Negative for back pain, gait problem and myalgias.  Skin:  Negative for rash.  Neurological:  Negative for dizziness and headaches.  Hematological:  Negative for adenopathy.  Psychiatric/Behavioral:  Negative for behavioral problems, confusion and decreased concentration.    Past Medical History:  Diagnosis Date   Anemia 05/08/2015   CML (chronic myelocytic leukemia) ([place]) 12/16/2011   Coronary heart disease    CVA (cerebrovascular accident) ([place])    GERD (gastroesophageal reflux disease)    Grief reaction 03/18/2014   H/O tobacco use, presenting hazards to health 02/24/2012   No cigarettes since pacemaker placement. Encouraged ongoing efforts    History of chicken pox    History of kidney stones    Hyperlipidemia    Hypertension    IBS (irritable bowel syndrome) 09/20/2013   Medicare annual wellness visit, subsequent 03/23/2014   PAF (paroxysmal atrial fibrillation) (HCC)    Symptomatic bradycardia    a. symptomatic bradycardia due to sinus node dysfunction s/p RA and RV Medtronic pacemaker 04/15/16     Social History   Socioeconomic History   Marital status: Widowed    Spouse name: Not on file   Number of children: 3   Years of education: Not on file   Highest education level: Not on file  Occupational History    Occupation: Retired    Comment: Retired  Tobacco Use   Smoking status: Former    Packs/day: 0.25    Years: 40.00    Pack years: 10.00    Types: Cigarettes    Start date: 10/30/1967    Quit date: 04/14/2016    Years since quitting: 5.7   Smokeless tobacco: Never  Vaping Use   Vaping Use: Never used  Substance and Sexual Activity   Alcohol use: No    Alcohol/week: 0.0 standard drinks    Comment: Rare   Drug use: No   Sexual activity: Never    Comment: wife died in Jan 25, 2023 married 77 years, grandson livew with patient  Other Topics Concern   Not on file  Social History Narrative   Not on file   Social Determinants of Health   Financial Resource Strain: Low Risk    Difficulty of Paying Living Expenses: Not hard at all  Food Insecurity: No Food Insecurity   Worried About Charity fundraiser in the Last Year: Never true   Arboriculturist in  the Last Year: Never true  Transportation Needs: No Transportation Needs   Lack of Transportation (Medical): No   Lack of Transportation (Non-Medical): No  Physical Activity: Inactive   Days of Exercise per Week: 0 days   Minutes of Exercise per Session: 0 min  Stress: No Stress Concern Present   Feeling of Stress : Not at all  Social Connections: Moderately Isolated   Frequency of Communication with Friends and Family: More than three times a week   Frequency of Social Gatherings with Friends and Family: Once a week   Attends Religious Services: 1 to 4 times per year   Active Member of [employer] or Organizations: No   Attends Archivist Meetings: Never   Marital Status: Widowed  Human resources officer Violence: Not At Risk   Fear of Current or Ex-Partner: No   Emotionally Abused: No   Physically Abused: No   Sexually Abused: No    Past Surgical History:  Procedure Laterality Date   CATARACT EXTRACTION     CHOLECYSTECTOMY  2003   CORONARY ANGIOPLASTY WITH STENT PLACEMENT  2000   EP IMPLANTABLE DEVICE N/A 04/15/2016   Procedure:  Pacemaker Implant;  Surgeon: Evans Lance, MD; Medtronic (serial number [id number]) pacemaker; Laterality: Left    Family History  Problem Relation Age of Onset   Arthritis Mother    Hypertension Mother    Heart disease Mother        Rheumatic fever   Rheumatic fever Mother    Other Mother        brain tumor   Cancer Mother        leukemia   Hypertension Father    Alcohol abuse Father    Diabetes Son    Colon cancer Neg Hx    Breast cancer Neg Hx    Prostate cancer Neg Hx     No Known Allergies  Current Outpatient Medications on File Prior to Visit  Medication Sig Dispense Refill   amiodarone (PACERONE) 200 MG tablet Take 1/2 (one-half) tablet by mouth once daily (Patient taking differently: Take 100 mg by mouth daily.) 45 tablet 3   amLODipine (NORVASC) 5 MG tablet Take 1 tablet by mouth once daily (Patient taking differently: Take 5 mg by mouth daily.) 90 tablet 0   aspirin EC 81 MG tablet Take 81 mg by mouth daily as needed for mild pain.     atorvastatin (LIPITOR) 40 MG tablet Take 1 tablet (40 mg total) by mouth daily. 90 tablet 3   dexamethasone (DECADRON) 6 MG tablet Take 1 tablet (6 mg total) by mouth daily. 5 tablet 0   ferrous sulfate 325 (65 FE) MG EC tablet Take 1 tablet (325 mg total) by mouth 2 (two) times daily. (Patient taking differently: Take 325 mg by mouth daily.) 60 tablet 11   fluticasone (FLONASE) 50 MCG/ACT nasal spray Place 2 sprays into both nostrils daily. 48 g 3   imatinib ([name]) 400 MG tablet TAKE 1 TABLET (400MG  TOTAL) BY MOUTH DAILY. TAKE WITH A MEAL AND LARGE GLASS OF WATER. (Patient taking differently: Take 400 mg by mouth daily.) 30 tablet 6   levothyroxine (SYNTHROID) 25 MCG tablet Take 1 tablet (25 mcg total) by mouth daily before breakfast. 30 tablet 2   losartan (COZAAR) 100 MG tablet Take 1 tablet (100 mg total) by mouth daily. 90 tablet 1   magnesium oxide (MAG-OX) 400 MG tablet Take 1 tablet (400 mg total) by mouth daily. Except  Tuesday and Saturday take it  BID. (Patient taking differently: Take 400 mg by mouth daily.) 30 tablet 2   meclizine (ANTIVERT) 25 MG tablet TAKE 1 TABLET THREE TIMES DAILY (Patient taking differently: Take 25 mg by mouth 3 (three) times daily.) 90 tablet 0   metoprolol succinate (TOPROL XL) 100 MG 24 hr tablet Take 1 tablet (100 mg total) by mouth daily. 90 tablet 1   nitroGLYCERIN (NITROSTAT) 0.4 MG SL tablet Place 1 tablet (0.4 mg total) under the tongue every 5 (five) minutes as needed for chest pain. 25 tablet 1   omeprazole (PRILOSEC) 40 MG capsule TAKE 1 CAPSULE EVERY DAY (NEED OFFICE VISIT FOR GREATER QUANTITIES/REFILLS) (Patient taking differently: 40 mg daily.) 90 capsule 0   ondansetron (ZOFRAN) 4 MG tablet Take 1 tablet (4 mg total) by mouth 2 (two) times daily as needed for nausea or vomiting. 180 tablet 1   potassium chloride SA (KLOR-CON) 20 MEQ tablet Take 1 tablet (20 mEq total) by mouth 3 (three) times daily. 15 tablet 0   rivaroxaban (XARELTO) 20 MG TABS tablet TAKE 1 TABLET BY MOUTH ONCE DAILY WITH SUPPER (Patient taking differently: 20 mg daily.) 90 tablet 1   Saccharomyces boulardii (PROBIOTIC) 250 MG CAPS Take 250 mg by mouth daily. 90 capsule 1   vitamin B-12 (CYANOCOBALAMIN) 500 MCG tablet Take 500 mcg by mouth daily.     Vitamin D, Ergocalciferol, (DRISDOL) 1.25 MG (50000 UNIT) CAPS capsule Take 1 capsule (50,000 Units total) by mouth every 7 (seven) days. 4 capsule 4   Current Facility-Administered Medications on File Prior to Visit  Medication Dose Route Frequency Provider Last Rate Last Admin   cyanocobalamin ((VITAMIN B-12)) injection 1,000 mcg  1,000 mcg Intramuscular Q30 days Penni Homans A, MD   1,000 mcg at 07/15/20 0958    BP (!) 122/52    Pulse 70    Resp 18    Ht 5\' 8"  (1.727 m)    Wt 176 lb 12.8 oz (80.2 kg)    SpO2 92%    BMI 26.88 kg/m        Objective:   Physical Exam  General- No acute distress. Pleasant patient. Neck- Full range of motion, no  jvd Lungs- Clear, even and unlabored. Heart- regular rate and rhythm. Neurologic- CNII- XII grossly intact.       Assessment & Plan:   Patient Instructions  Recent * Acute hypoxemic respiratory failure with covid. Complicated med history below.  Atrial fibrillation with RVR (HCC)   PAF (paroxysmal atrial fibrillation) (HCC)   Severe sepsis due to COVID-19 virus infection   CML (chronic myelocytic leukemia) (HCC)   Chronic anticoagulation   Hypomagnesemia   Hypokalemia   Hypertension   Hypothyroidism   Stage 3a chronic kidney disease (CKD) (HCC)   Abdominal aortic aneurysm   Demand ischemia (HCC)   IBS (irritable bowel syndrome)   Pulmonary nodule less than 1 cm in diameter with moderate to high risk for malignant neoplasm   Had pneumonia on xray. Thankfully much improved since DC from hospital. Continuing to improve daily.  Will get cbc, cmp and cxr for follow up work up.  Will make albuterol inhaler available if you get wheezing or sob. You have 02 to use at home if needed.  Will send Dr. Charlett Blake note regarding need to repeat ct to evaluate/follow lung nodule.  Follow up in 1 month pcp if possible. Or follow up sooner if needed.   Mackie Pai, PA-C    Pt decided to not get lab today thinkgin  he would get lab with hematologist tomorrow.

## 2022-01-01 ENCOUNTER — Other Ambulatory Visit: Payer: Self-pay

## 2022-01-01 ENCOUNTER — Encounter: Payer: Self-pay | Admitting: Hematology & Oncology

## 2022-01-01 ENCOUNTER — Inpatient Hospital Stay: Payer: Commercial Managed Care - HMO | Attending: Hematology & Oncology

## 2022-01-01 ENCOUNTER — Inpatient Hospital Stay (HOSPITAL_BASED_OUTPATIENT_CLINIC_OR_DEPARTMENT_OTHER): Payer: Commercial Managed Care - HMO | Admitting: Hematology & Oncology

## 2022-01-01 VITALS — BP 112/86 | HR 111 | Temp 97.6°F | Resp 20 | Wt 172.0 lb

## 2022-01-01 DIAGNOSIS — Z79899 Other long term (current) drug therapy: Secondary | ICD-10-CM | POA: Diagnosis not present

## 2022-01-01 DIAGNOSIS — I480 Paroxysmal atrial fibrillation: Secondary | ICD-10-CM | POA: Diagnosis not present

## 2022-01-01 DIAGNOSIS — C9210 Chronic myeloid leukemia, BCR/ABL-positive, not having achieved remission: Secondary | ICD-10-CM | POA: Diagnosis not present

## 2022-01-01 DIAGNOSIS — Z7901 Long term (current) use of anticoagulants: Secondary | ICD-10-CM | POA: Diagnosis not present

## 2022-01-01 LAB — CBC WITH DIFFERENTIAL (CANCER CENTER ONLY)
Abs Immature Granulocytes: 0.43 10*3/uL — ABNORMAL HIGH (ref 0.00–0.07)
Basophils Absolute: 0.1 10*3/uL (ref 0.0–0.1)
Basophils Relative: 1 %
Eosinophils Absolute: 0.2 10*3/uL (ref 0.0–0.5)
Eosinophils Relative: 1 %
HCT: 41.6 % (ref 39.0–52.0)
Hemoglobin: 13.5 g/dL (ref 13.0–17.0)
Immature Granulocytes: 3 %
Lymphocytes Relative: 8 %
Lymphs Abs: 1.3 10*3/uL (ref 0.7–4.0)
MCH: 31.3 pg (ref 26.0–34.0)
MCHC: 32.5 g/dL (ref 30.0–36.0)
MCV: 96.3 fL (ref 80.0–100.0)
Monocytes Absolute: 1.4 10*3/uL — ABNORMAL HIGH (ref 0.1–1.0)
Monocytes Relative: 9 %
Neutro Abs: 13.2 10*3/uL — ABNORMAL HIGH (ref 1.7–7.7)
Neutrophils Relative %: 78 %
Platelet Count: 183 10*3/uL (ref 150–400)
RBC: 4.32 MIL/uL (ref 4.22–5.81)
RDW: 14.7 % (ref 11.5–15.5)
WBC Count: 16.7 10*3/uL — ABNORMAL HIGH (ref 4.0–10.5)
nRBC: 0 % (ref 0.0–0.2)

## 2022-01-01 LAB — LACTATE DEHYDROGENASE: LDH: 315 U/L — ABNORMAL HIGH (ref 98–192)

## 2022-01-01 LAB — CMP (CANCER CENTER ONLY)
ALT: 31 U/L (ref 0–44)
AST: 17 U/L (ref 15–41)
Albumin: 3.8 g/dL (ref 3.5–5.0)
Alkaline Phosphatase: 96 U/L (ref 38–126)
Anion gap: 8 (ref 5–15)
BUN: 21 mg/dL (ref 8–23)
CO2: 28 mmol/L (ref 22–32)
Calcium: 9.7 mg/dL (ref 8.9–10.3)
Chloride: 104 mmol/L (ref 98–111)
Creatinine: 1.21 mg/dL (ref 0.61–1.24)
GFR, Estimated: >60 mL/min (ref >60)
Glucose, Bld: 84 mg/dL (ref 70–99)
Potassium: 4.8 mmol/L (ref 3.5–5.1)
Sodium: 140 mmol/L (ref 135–145)
Total Bilirubin: 0.6 mg/dL (ref 0.3–1.2)
Total Protein: 6.3 g/dL — ABNORMAL LOW (ref 6.5–8.1)

## 2022-01-01 NOTE — Progress Notes (Signed)
Hematology and Oncology Follow Up Visit  Jason [name] [id number] 01-22-1946 76 y.o. 01/01/2022   Principle Diagnosis:  Chronic phase CML - MMR Paroxysmal atrial fibrillation - has pacemaker   Current Therapy:   [name] 400 mg by mouth daily -- started on 09/2019 Xarelto 20 mg by mouth daily   Interim History:  Jason [name] is here today for follow-up.  Unfortunately, he was recently in the hospital with COVID.  He had never had a COVID-vaccine.  Thankfully, he was not intubated.  He is on oxygen right now.  Hopefully he will be able to come off oxygen.  He had been on steroids.  He said he took the last steroid earlier this week.  He is still on his Xarelto.  He has had no problems with Xarelto.  The COVID it did not affect his atrial fibrillation.  Only last saw him back in July, the BCR/ABL was not detectable.  He has had no nausea or vomiting.  There is been no change in bowel or bladder habits.  He has had no leg swelling.  Overall, I would say his performance status is ECOG 2.  .    Medications:  Allergies as of 01/01/2022   No Known Allergies      Medication List        Accurate as of January 01, 2022 10:56 AM. If you have any questions, ask your nurse or doctor.          albuterol 108 (90 Base) MCG/ACT inhaler Commonly known as: VENTOLIN HFA Inhale 2 puffs into the lungs every 6 (six) hours as needed.   amiodarone 200 MG tablet Commonly known as: PACERONE Take 1/2 (one-half) tablet by mouth once daily What changed:  how much to take how to take this when to take this additional instructions   amLODipine 5 MG tablet Commonly known as: NORVASC Take 1 tablet by mouth once daily   aspirin EC 81 MG tablet Take 81 mg by mouth daily as needed for mild pain.   atorvastatin 40 MG tablet Commonly known as: LIPITOR Take 1 tablet (40 mg total) by mouth daily.   dexamethasone 6 MG tablet Commonly known as: DECADRON Take 1 tablet (6 mg total) by mouth daily.    ferrous sulfate 325 (65 FE) MG EC tablet Take 1 tablet (325 mg total) by mouth 2 (two) times daily. What changed: when to take this   fluticasone 50 MCG/ACT nasal spray Commonly known as: FLONASE Place 2 sprays into both nostrils daily.   imatinib 400 MG tablet Commonly known as: [name] TAKE 1 TABLET (400MG TOTAL) BY MOUTH DAILY. TAKE WITH A MEAL AND LARGE GLASS OF WATER. What changed: See the new instructions.   levothyroxine 25 MCG tablet Commonly known as: SYNTHROID Take 1 tablet (25 mcg total) by mouth daily before breakfast.   losartan 100 MG tablet Commonly known as: COZAAR Take 1 tablet (100 mg total) by mouth daily.   magnesium oxide 400 MG tablet Commonly known as: MAG-OX Take 1 tablet (400 mg total) by mouth daily. Except Tuesday and Saturday take it BID. What changed: additional instructions   meclizine 25 MG tablet Commonly known as: ANTIVERT TAKE 1 TABLET THREE TIMES DAILY   metoprolol succinate 100 MG 24 hr tablet Commonly known as: Toprol XL Take 1 tablet (100 mg total) by mouth daily.   nitroGLYCERIN 0.4 MG SL tablet Commonly known as: NITROSTAT Place 1 tablet (0.4 mg total) under the tongue every 5 (five) minutes as  needed for chest pain.   omeprazole 40 MG capsule Commonly known as: PRILOSEC TAKE 1 CAPSULE EVERY DAY (NEED OFFICE VISIT FOR GREATER QUANTITIES/REFILLS) What changed:  how much to take when to take this additional instructions   ondansetron 4 MG tablet Commonly known as: ZOFRAN Take 1 tablet (4 mg total) by mouth 2 (two) times daily as needed for nausea or vomiting.   potassium chloride SA 20 MEQ tablet Commonly known as: KLOR-CON M Take 1 tablet (20 mEq total) by mouth 3 (three) times daily.   Probiotic 250 MG Caps Take 250 mg by mouth daily.   vitamin B-12 500 MCG tablet Commonly known as: CYANOCOBALAMIN Take 500 mcg by mouth daily.   Vitamin D (Ergocalciferol) 1.25 MG (50000 UNIT) Caps capsule Commonly known as:  DRISDOL Take 1 capsule (50,000 Units total) by mouth every 7 (seven) days.   Xarelto 20 MG Tabs tablet Generic drug: rivaroxaban TAKE 1 TABLET BY MOUTH ONCE DAILY WITH SUPPER What changed:  how much to take how to take this when to take this        Allergies: No Known Allergies  Past Medical History, Surgical history, Social history, and Family History were reviewed and updated.  Review of Systems: Review of Systems  Constitutional: Negative.   HENT: Negative.    Eyes: Negative.   Respiratory: Negative.    Cardiovascular: Negative.   Gastrointestinal: Negative.   Genitourinary: Negative.   Musculoskeletal: Negative.   Skin: Negative.   Neurological: Negative.   Endo/Heme/Allergies: Negative.   Psychiatric/Behavioral: Negative.      Physical Exam:  vitals were not taken for this visit.   Wt Readings from Last 3 Encounters:  12/31/21 176 lb 12.8 oz (80.2 kg)  12/15/21 156 lb 4.9 oz (70.9 kg)  11/12/21 184 lb 9.6 oz (83.7 kg)    Physical Exam Vitals reviewed.  HENT:     Head: Normocephalic and atraumatic.  Eyes:     Pupils: Pupils are equal, round, and reactive to light.  Cardiovascular:     Rate and Rhythm: Normal rate and regular rhythm.     Heart sounds: Normal heart sounds.  Pulmonary:     Effort: Pulmonary effort is normal.     Breath sounds: Normal breath sounds.  Abdominal:     General: Bowel sounds are normal.     Palpations: Abdomen is soft.  Musculoskeletal:        General: No tenderness or deformity. Normal range of motion.     Cervical back: Normal range of motion.  Lymphadenopathy:     Cervical: No cervical adenopathy.  Skin:    General: Skin is warm and dry.     Findings: No erythema or rash.  Neurological:     Mental Status: He is alert and oriented to person, place, and time.  Psychiatric:        Behavior: Behavior normal.        Thought Content: Thought content normal.        Judgment: Judgment normal.     Lab Results   Component Value Date   WBC 16.7 (H) 01/01/2022   HGB 13.5 01/01/2022   HCT 41.6 01/01/2022   MCV 96.3 01/01/2022   PLT 183 01/01/2022   Lab Results  Component Value Date   FERRITIN 410 (H) 12/16/2021   IRON 84 05/27/2015   TIBC 359 05/27/2015   UIBC 275 05/27/2015   IRONPCTSAT 23 05/27/2015   Lab Results  Component Value Date   RETICCTPCT 1.2 05/27/2015  RBC 4.32 01/01/2022   RETICCTABS 48.2 05/27/2015   No results found for: KPAFRELGTCHN, LAMBDASER, KAPLAMBRATIO No results found for: Kandis Cocking, IGMSERUM No results found for: Kathrynn Ducking, MSPIKE, SPEI   Chemistry      Component Value Date/Time   NA 140 01/01/2022 0942   NA 141 12/13/2019 0955   NA 143 11/26/2016 1030   K 4.8 01/01/2022 0942   K 3.8 11/26/2016 1030   CL 104 01/01/2022 0942   CL 109 (H) 03/31/2015 1119   CO2 28 01/01/2022 0942   CO2 25 11/26/2016 1030   BUN 21 01/01/2022 0942   BUN 13 12/13/2019 0955   BUN 5.1 (L) 11/26/2016 1030   CREATININE 1.21 01/01/2022 0942   CREATININE 1.23 (H) 09/11/2020 1125   CREATININE 1.0 11/26/2016 1030      Component Value Date/Time   CALCIUM 9.7 01/01/2022 0942   CALCIUM 9.3 11/26/2016 1030   ALKPHOS 96 01/01/2022 0942   ALKPHOS 106 11/26/2016 1030   AST 17 01/01/2022 0942   AST 16 11/26/2016 1030   ALT 31 01/01/2022 0942   ALT 10 11/26/2016 1030   BILITOT 0.6 01/01/2022 0942   BILITOT 0.53 11/26/2016 1030      Impression and Plan: Mr. Tennis is a very pleasant 76yo caucasian male with chronic phase CML.  He was only off [place] for about 4 months before he recurred.  I feel bad that he had the [place].  Again, he looks little bit more "rough" then we last saw him.  Hopefully he will not need to be on oxygen long-term.  We will see what the BCR/ABL is.  His white cell count is on the higher side.  I looked at his blood smear.  Has mostly mature polys.  As such, I had believe the leukocytosis is from steroid  use.  We will plan to get him back a little bit sooner than 6 months.  We will have to get him back in 3 months given the fact that he had COVID and has had some complications. Volanda Napoleon, MD 1/6/202310:56 AM

## 2022-01-06 ENCOUNTER — Ambulatory Visit: Payer: Medicare Other | Admitting: Cardiology

## 2022-01-07 ENCOUNTER — Institutional Professional Consult (permissible substitution): Payer: Medicare Other | Admitting: Pulmonary Disease

## 2022-01-07 NOTE — Progress Notes (Incomplete)
Synopsis: Referred in January 2023 for lung nodule by Mosie Lukes, MD  Subjective:   PATIENT ID: Jason [name] GENDER: male DOB: 06/04/1946, MRN: [id number]  No chief complaint on file.   This is a 75 year old gentleman, past medical history of CML, coronary disease, CVA, hypertension, hyperlipidemia, PAF.  History of tobacco abuse.Patient was recently discharged from the hospital on 12/19/2021.  Patient was found to have lung nodules incidentally found on CT imaging.  Patient CT imaging of the chest for hospitalization demonstrated a infrarenal abdominal aortic aneurysm, evidence of fibrotic interstitial lung disease in the subpleural spaces as well as multiple small unchanged pulmonary nodules the largest within the anterior left upper lobe at 7 mm.  Patient was referred here today for abnormality seen on CT imaging including evidence of emphysema.   Past Medical History:  Diagnosis Date   Anemia 05/08/2015   CML (chronic myelocytic leukemia) ([facility]) 12/16/2011   Coronary heart disease    CVA (cerebrovascular accident) ([place])    GERD (gastroesophageal reflux disease)    Grief reaction 03/18/2014   H/O tobacco use, presenting hazards to health 02/24/2012   No cigarettes since pacemaker placement. Encouraged ongoing efforts    History of chicken pox    History of kidney stones    Hyperlipidemia    Hypertension    IBS (irritable bowel syndrome) 09/20/2013   Medicare annual wellness visit, subsequent 03/23/2014   PAF (paroxysmal atrial fibrillation) ([facility])    Symptomatic bradycardia    a. symptomatic bradycardia due to sinus node dysfunction s/p RA and RV Medtronic pacemaker 04/15/16     Family History  Problem Relation Age of Onset   Arthritis Mother    Hypertension Mother    Heart disease Mother        Rheumatic fever   Rheumatic fever Mother    Other Mother        brain tumor   Cancer Mother        leukemia   Hypertension Father    Alcohol abuse Father     Diabetes Son    Colon cancer Neg Hx    Breast cancer Neg Hx    Prostate cancer Neg Hx      Past Surgical History:  Procedure Laterality Date   CATARACT EXTRACTION     CHOLECYSTECTOMY  2003   CORONARY ANGIOPLASTY WITH STENT PLACEMENT  2000   EP IMPLANTABLE DEVICE N/A 04/15/2016   Procedure: Pacemaker Implant;  Surgeon: Evans Lance, MD; Medtronic (serial number [id number]) pacemaker; Laterality: Left    Social History   Socioeconomic History   Marital status: Widowed    Spouse name: Not on file   Number of children: 3   Years of education: Not on file   Highest education level: Not on file  Occupational History   Occupation: Retired    Comment: Retired  Tobacco Use   Smoking status: Former    Packs/day: 0.25    Years: 40.00    Pack years: 10.00    Types: Cigarettes    Start date: 10/30/1967    Quit date: 04/14/2016    Years since quitting: 5.7   Smokeless tobacco: Never  Vaping Use   Vaping Use: Never used  Substance and Sexual Activity   Alcohol use: No    Alcohol/week: 0.0 standard drinks    Comment: Rare   Drug use: No   Sexual activity: Never    Comment: wife died in 01-22-2023 married 45 years, grandson livew with patient  Other  Topics Concern   Not on file  Social History Narrative   Not on file   Social Determinants of Health   Financial Resource Strain: Low Risk    Difficulty of Paying Living Expenses: Not hard at all  Food Insecurity: No Food Insecurity   Worried About Charity fundraiser in the Last Year: Never true   [place] in the Last Year: Never true  Transportation Needs: No Transportation Needs   Lack of Transportation (Medical): No   Lack of Transportation (Non-Medical): No  Physical Activity: Inactive   Days of Exercise per Week: 0 days   Minutes of Exercise per Session: 0 min  Stress: No Stress Concern Present   Feeling of Stress : Not at all  Social Connections: Moderately Isolated   Frequency of  Communication with Friends and Family: More than three times a week   Frequency of Social Gatherings with Friends and Family: Once a week   Attends Religious Services: 1 to 4 times per year   Active Member of [employer] or Organizations: No   Attends Archivist Meetings: Never   Marital Status: Widowed  Human resources officer Violence: Not At Risk   Fear of Current or Ex-Partner: No   Emotionally Abused: No   Physically Abused: No   Sexually Abused: No     No Known Allergies   Outpatient Medications Prior to Visit  Medication Sig Dispense Refill   albuterol (VENTOLIN HFA) 108 (90 Base) MCG/ACT inhaler Inhale 2 puffs into the lungs every 6 (six) hours as needed. 18 g 0   amiodarone (PACERONE) 200 MG tablet Take 1/2 (one-half) tablet by mouth once daily (Patient taking differently: Take 100 mg by mouth daily.) 45 tablet 3   amLODipine (NORVASC) 5 MG tablet Take 1 tablet by mouth once daily (Patient taking differently: Take 5 mg by mouth daily.) 90 tablet 0   aspirin EC 81 MG tablet Take 81 mg by mouth daily as needed for mild pain.     atorvastatin (LIPITOR) 40 MG tablet Take 1 tablet (40 mg total) by mouth daily. 90 tablet 3   dexamethasone (DECADRON) 6 MG tablet Take 1 tablet (6 mg total) by mouth daily. 5 tablet 0   ferrous sulfate 325 (65 FE) MG EC tablet Take 1 tablet (325 mg total) by mouth 2 (two) times daily. (Patient taking differently: Take 325 mg by mouth daily.) 60 tablet 11   fluticasone (FLONASE) 50 MCG/ACT nasal spray Place 2 sprays into both nostrils daily. 48 g 3   imatinib ([name]) 400 MG tablet TAKE 1 TABLET (400MG  TOTAL) BY MOUTH DAILY. TAKE WITH A MEAL AND LARGE GLASS OF WATER. (Patient taking differently: Take 400 mg by mouth daily.) 30 tablet 6   levothyroxine (SYNTHROID) 25 MCG tablet Take 1 tablet (25 mcg total) by mouth daily before breakfast. 30 tablet 2   losartan (COZAAR) 100 MG tablet Take 1 tablet (100 mg total) by mouth daily. 90 tablet 1    magnesium oxide (MAG-OX) 400 MG tablet Take 1 tablet (400 mg total) by mouth daily. Except Tuesday and Saturday take it BID. (Patient taking differently: Take 400 mg by mouth daily.) 30 tablet 2   meclizine (ANTIVERT) 25 MG tablet TAKE 1 TABLET THREE TIMES DAILY (Patient taking differently: Take 25 mg by mouth 3 (three) times daily.) 90 tablet 0   metoprolol succinate (TOPROL XL) 100 MG 24 hr tablet Take 1 tablet (100 mg total) by mouth daily. 90 tablet 1  nitroGLYCERIN (NITROSTAT) 0.4 MG SL tablet Place 1 tablet (0.4 mg total) under the tongue every 5 (five) minutes as needed for chest pain. 25 tablet 1   omeprazole (PRILOSEC) 40 MG capsule TAKE 1 CAPSULE EVERY DAY (NEED OFFICE VISIT FOR GREATER QUANTITIES/REFILLS) (Patient taking differently: 40 mg daily.) 90 capsule 0   ondansetron (ZOFRAN) 4 MG tablet Take 1 tablet (4 mg total) by mouth 2 (two) times daily as needed for nausea or vomiting. 180 tablet 1   potassium chloride SA (KLOR-CON) 20 MEQ tablet Take 1 tablet (20 mEq total) by mouth 3 (three) times daily. 15 tablet 0   rivaroxaban (XARELTO) 20 MG TABS tablet TAKE 1 TABLET BY MOUTH ONCE DAILY WITH SUPPER (Patient taking differently: 20 mg daily.) 90 tablet 1   Saccharomyces boulardii (PROBIOTIC) 250 MG CAPS Take 250 mg by mouth daily. 90 capsule 1   vitamin B-12 (CYANOCOBALAMIN) 500 MCG tablet Take 500 mcg by mouth daily.     Vitamin D, Ergocalciferol, (DRISDOL) 1.25 MG (50000 UNIT) CAPS capsule Take 1 capsule (50,000 Units total) by mouth every 7 (seven) days. 4 capsule 4   Facility-Administered Medications Prior to Visit  Medication Dose Route Frequency Provider Last Rate Last Admin   cyanocobalamin ((VITAMIN B-12)) injection 1,000 mcg  1,000 mcg Intramuscular Q30 days Mosie Lukes, MD   1,000 mcg at 07/15/20 0958    ROS   Objective:  Physical Exam   There were no vitals filed for this visit.   on *** LPM *** RA BMI Readings from Last 3 Encounters:  01/01/22  26.15 kg/m  12/31/21 26.88 kg/m  12/15/21 23.77 kg/m   Wt Readings from Last 3 Encounters:  01/01/22 172 lb (78 kg)  12/31/21 176 lb 12.8 oz (80.2 kg)  12/15/21 156 lb 4.9 oz (70.9 kg)     CBC    Component Value Date/Time   WBC 16.7 (H) 01/01/2022 0942   WBC 5.4 12/16/2021 0240   RBC 4.32 01/01/2022 0942   HGB 13.5 01/01/2022 0942   HGB 12.7 (L) 12/02/2017 0951   HCT 41.6 01/01/2022 0942   HCT 38.1 (L) 12/02/2017 0951   PLT 183 01/01/2022 0942   PLT 265 12/02/2017 0951   MCV 96.3 01/01/2022 0942   MCV 94 12/02/2017 0951   MCH 31.3 01/01/2022 0942   MCHC 32.5 01/01/2022 0942   RDW 14.7 01/01/2022 0942   RDW 14.0 12/02/2017 0951   LYMPHSABS 1.3 01/01/2022 0942   LYMPHSABS 2.0 12/02/2017 0951   MONOABS 1.4 (H) 01/01/2022 0942   EOSABS 0.2 01/01/2022 0942   EOSABS 0.5 12/02/2017 0951   BASOSABS 0.1 01/01/2022 0942   BASOSABS 0.1 12/02/2017 0951    ***  Chest Imaging: ***  Pulmonary Functions Testing Results: No flowsheet data found.  FeNO: ***  Pathology: ***  Echocardiogram: ***  Heart Catheterization: ***    Assessment & Plan:   No diagnosis found.  Discussion: ***   Current Outpatient Medications:    albuterol (VENTOLIN HFA) 108 (90 Base) MCG/ACT inhaler, Inhale 2 puffs into the lungs every 6 (six) hours as needed., Disp: 18 g, Rfl: 0   amiodarone (PACERONE) 200 MG tablet, Take 1/2 (one-half) tablet by mouth once daily (Patient taking differently: Take 100 mg by mouth daily.), Disp: 45 tablet, Rfl: 3   amLODipine (NORVASC) 5 MG tablet, Take 1 tablet by mouth once daily (Patient taking differently: Take 5 mg by mouth daily.), Disp: 90 tablet, Rfl: 0   aspirin EC 81 MG tablet, Take 81  mg by mouth daily as needed for mild pain., Disp: , Rfl:    atorvastatin (LIPITOR) 40 MG tablet, Take 1 tablet (40 mg total) by mouth daily., Disp: 90 tablet, Rfl: 3   dexamethasone (DECADRON) 6 MG tablet, Take 1 tablet (6 mg total) by mouth daily., Disp: 5  tablet, Rfl: 0   ferrous sulfate 325 (65 FE) MG EC tablet, Take 1 tablet (325 mg total) by mouth 2 (two) times daily. (Patient taking differently: Take 325 mg by mouth daily.), Disp: 60 tablet, Rfl: 11   fluticasone (FLONASE) 50 MCG/ACT nasal spray, Place 2 sprays into both nostrils daily., Disp: 48 g, Rfl: 3   imatinib ([name]) 400 MG tablet, TAKE 1 TABLET (400MG  TOTAL) BY MOUTH DAILY. TAKE WITH A MEAL AND LARGE GLASS OF WATER. (Patient taking differently: Take 400 mg by mouth daily.), Disp: 30 tablet, Rfl: 6   levothyroxine (SYNTHROID) 25 MCG tablet, Take 1 tablet (25 mcg total) by mouth daily before breakfast., Disp: 30 tablet, Rfl: 2   losartan (COZAAR) 100 MG tablet, Take 1 tablet (100 mg total) by mouth daily., Disp: 90 tablet, Rfl: 1   magnesium oxide (MAG-OX) 400 MG tablet, Take 1 tablet (400 mg total) by mouth daily. Except Tuesday and Saturday take it BID. (Patient taking differently: Take 400 mg by mouth daily.), Disp: 30 tablet, Rfl: 2   meclizine (ANTIVERT) 25 MG tablet, TAKE 1 TABLET THREE TIMES DAILY (Patient taking differently: Take 25 mg by mouth 3 (three) times daily.), Disp: 90 tablet, Rfl: 0   metoprolol succinate (TOPROL XL) 100 MG 24 hr tablet, Take 1 tablet (100 mg total) by mouth daily., Disp: 90 tablet, Rfl: 1   nitroGLYCERIN (NITROSTAT) 0.4 MG SL tablet, Place 1 tablet (0.4 mg total) under the tongue every 5 (five) minutes as needed for chest pain., Disp: 25 tablet, Rfl: 1   omeprazole (PRILOSEC) 40 MG capsule, TAKE 1 CAPSULE EVERY DAY (NEED OFFICE VISIT FOR GREATER QUANTITIES/REFILLS) (Patient taking differently: 40 mg daily.), Disp: 90 capsule, Rfl: 0   ondansetron (ZOFRAN) 4 MG tablet, Take 1 tablet (4 mg total) by mouth 2 (two) times daily as needed for nausea or vomiting., Disp: 180 tablet, Rfl: 1   potassium chloride SA (KLOR-CON) 20 MEQ tablet, Take 1 tablet (20 mEq total) by mouth 3 (three) times daily., Disp: 15 tablet, Rfl: 0   rivaroxaban (XARELTO) 20  MG TABS tablet, TAKE 1 TABLET BY MOUTH ONCE DAILY WITH SUPPER (Patient taking differently: 20 mg daily.), Disp: 90 tablet, Rfl: 1   Saccharomyces boulardii (PROBIOTIC) 250 MG CAPS, Take 250 mg by mouth daily., Disp: 90 capsule, Rfl: 1   vitamin B-12 (CYANOCOBALAMIN) 500 MCG tablet, Take 500 mcg by mouth daily., Disp: , Rfl:    Vitamin D, Ergocalciferol, (DRISDOL) 1.25 MG (50000 UNIT) CAPS capsule, Take 1 capsule (50,000 Units total) by mouth every 7 (seven) days., Disp: 4 capsule, Rfl: 4  Current Facility-Administered Medications:    cyanocobalamin ((VITAMIN B-12)) injection 1,000 mcg, 1,000 mcg, Intramuscular, Q30 days, Mosie Lukes, MD, 1,000 mcg at 07/15/20 3570  I spent *** minutes dedicated to the care of this patient on the date of this encounter to include pre-visit review of records, face-to-face time with the patient discussing conditions above, post visit ordering of testing, clinical documentation with the electronic health record, making appropriate referrals as documented, and communicating necessary findings to members of the patients care team.   Garner Nash, DO [facility] 01/07/2022 10:32 AM

## 2022-01-08 LAB — BCR/ABL

## 2022-01-15 ENCOUNTER — Ambulatory Visit (INDEPENDENT_AMBULATORY_CARE_PROVIDER_SITE_OTHER): Payer: Medicare Other

## 2022-01-15 DIAGNOSIS — I495 Sick sinus syndrome: Secondary | ICD-10-CM

## 2022-01-15 LAB — CUP PACEART REMOTE DEVICE CHECK
Battery Remaining Longevity: 45 mo
Battery Voltage: 2.99 V
Brady Statistic AP VP Percent: 0.75 %
Brady Statistic AP VS Percent: 69.02 %
Brady Statistic AS VP Percent: 0.51 %
Brady Statistic AS VS Percent: 29.72 %
Brady Statistic RA Percent Paced: 53.09 %
Brady Statistic RV Percent Paced: 1.18 %
Date Time Interrogation Session: 20230120135636
Implantable Lead Implant Date: 20170420
Implantable Lead Implant Date: 20170420
Implantable Lead Location: 753859
Implantable Lead Location: 753860
Implantable Lead Model: 5076
Implantable Lead Model: 5076
Implantable Pulse Generator Implant Date: 20170420
Lead Channel Impedance Value: 323 Ohm
Lead Channel Impedance Value: 323 Ohm
Lead Channel Impedance Value: 380 Ohm
Lead Channel Impedance Value: 437 Ohm
Lead Channel Pacing Threshold Amplitude: 0.5 V
Lead Channel Pacing Threshold Amplitude: 0.75 V
Lead Channel Pacing Threshold Pulse Width: 0.4 ms
Lead Channel Pacing Threshold Pulse Width: 0.4 ms
Lead Channel Sensing Intrinsic Amplitude: 1.125 mV
Lead Channel Sensing Intrinsic Amplitude: 1.125 mV
Lead Channel Sensing Intrinsic Amplitude: 7.5 mV
Lead Channel Sensing Intrinsic Amplitude: 7.5 mV
Lead Channel Setting Pacing Amplitude: 2 V
Lead Channel Setting Pacing Amplitude: 2.5 V
Lead Channel Setting Pacing Pulse Width: 0.4 ms
Lead Channel Setting Sensing Sensitivity: 2 mV

## 2022-01-20 ENCOUNTER — Other Ambulatory Visit: Payer: Self-pay | Admitting: Family Medicine

## 2022-01-20 ENCOUNTER — Other Ambulatory Visit: Payer: Self-pay | Admitting: Cardiology

## 2022-01-20 MED ORDER — LEVOTHYROXINE SODIUM 25 MCG PO TABS: 25.0000 ug | ORAL_TABLET | Freq: Every day | ORAL | 2 refills | Status: AC

## 2022-01-28 NOTE — Progress Notes (Signed)
Remote pacemaker transmission.   

## 2022-02-09 ENCOUNTER — Other Ambulatory Visit: Payer: Self-pay

## 2022-02-09 ENCOUNTER — Telehealth: Payer: Self-pay | Admitting: Family Medicine

## 2022-02-09 MED ORDER — OMEPRAZOLE 40 MG PO CPDR: 40.0000 mg | DELAYED_RELEASE_CAPSULE | Freq: Every day | ORAL | 2 refills | Status: AC

## 2022-02-09 NOTE — Telephone Encounter (Signed)
Medication sent.

## 2022-02-09 NOTE — Telephone Encounter (Signed)
Medication: omeprazole (PRILOSEC) 40 MG capsule   Has the patient contacted their pharmacy? Yes.    Preferred Pharmacy (with phone number or street name):   [facility], [place] [address]  Phone:  [phone]  Fax:  [phone]  Agent: Please be advised that RX refills may take up to 3 business days. We ask that you follow-up with your pharmacy.

## 2022-02-09 NOTE — Telephone Encounter (Signed)
medication

## 2022-02-10 ENCOUNTER — Telehealth: Payer: Self-pay | Admitting: *Deleted

## 2022-02-10 MED ORDER — METOPROLOL SUCCINATE ER 200 MG PO TB24: 200.0000 mg | ORAL_TABLET | Freq: Every day | ORAL | 3 refills | Status: DC

## 2022-02-10 NOTE — Addendum Note (Signed)
Addended by: Stanton Kidney on: 02/10/2022 05:20 PM   Modules accepted: Orders

## 2022-02-10 NOTE — Telephone Encounter (Signed)
Called and spoke to patient and informed him of results of pacemaker check and recommendation of increasing his Toprol to 200mg  daily. Questioned pt abt baseline BP and pt denies checking readings at home. Pt does state that he usually runs low-in part due to his cancer treatments. Advised that pt not make any medication changes and will check with Dr Curt Bears again, to be sure he wants this increase.

## 2022-02-10 NOTE — Telephone Encounter (Signed)
Will Meredith Leeds, MD       Normal remote reviewed. Battery and lead parameters stable. Rapid AF episodes. Increase toprol xl to 200 mg.

## 2022-02-10 NOTE — Telephone Encounter (Signed)
Patient is returning

## 2022-02-10 NOTE — Telephone Encounter (Signed)
Pt aware of PPM transmission findings showing: 887 AF episodes, 453 pace terminated, (51.4%) up to 44 cycles of ATP  Burden 7.2%, Hx of RVR, Amiodarone, Metoprolol, Xarelto prescribed  35 NSVT, one showing 1:1, other AF with RVR  208 fast AV, longest 40min   Advised he needs to increase Toprol to 200 mg once daily. Advised to call office if SE begin after medication increase, aware to see how he responds to increased dose before driving. Patient verbalized understanding and agreeable to plan.  Aware will see is this help at next months OV. Patient verbalized understanding and agreeable to plan.

## 2022-02-10 NOTE — Telephone Encounter (Signed)
Patient is returning call.  °

## 2022-02-10 NOTE — Telephone Encounter (Signed)
Left message to call back  

## 2022-02-15 ENCOUNTER — Encounter (HOSPITAL_BASED_OUTPATIENT_CLINIC_OR_DEPARTMENT_OTHER): Payer: Self-pay | Admitting: *Deleted

## 2022-02-15 ENCOUNTER — Other Ambulatory Visit: Payer: Self-pay

## 2022-02-15 ENCOUNTER — Inpatient Hospital Stay (HOSPITAL_BASED_OUTPATIENT_CLINIC_OR_DEPARTMENT_OTHER)
Admission: EM | Admit: 2022-02-15 | Discharge: 2022-02-19 | DRG: 196 | Disposition: A | Discharge status: Home Health | Payer: Medicare Other | Attending: Internal Medicine | Admitting: Internal Medicine

## 2022-02-15 ENCOUNTER — Emergency Department (HOSPITAL_BASED_OUTPATIENT_CLINIC_OR_DEPARTMENT_OTHER): Payer: Medicare Other

## 2022-02-15 DIAGNOSIS — J441 Chronic obstructive pulmonary disease with (acute) exacerbation: Secondary | ICD-10-CM | POA: Diagnosis present

## 2022-02-15 DIAGNOSIS — Z833 Family history of diabetes mellitus: Secondary | ICD-10-CM

## 2022-02-15 DIAGNOSIS — E039 Hypothyroidism, unspecified: Secondary | ICD-10-CM | POA: Diagnosis present

## 2022-02-15 DIAGNOSIS — I5023 Acute on chronic systolic (congestive) heart failure: Secondary | ICD-10-CM

## 2022-02-15 DIAGNOSIS — I130 Hypertensive heart and chronic kidney disease with heart failure and stage 1 through stage 4 chronic kidney disease, or unspecified chronic kidney disease: Secondary | ICD-10-CM | POA: Diagnosis present

## 2022-02-15 DIAGNOSIS — I480 Paroxysmal atrial fibrillation: Secondary | ICD-10-CM | POA: Diagnosis present

## 2022-02-15 DIAGNOSIS — I248 Other forms of acute ischemic heart disease: Secondary | ICD-10-CM | POA: Diagnosis present

## 2022-02-15 DIAGNOSIS — Z6823 Body mass index (BMI) 23.0-23.9, adult: Secondary | ICD-10-CM

## 2022-02-15 DIAGNOSIS — Z8673 Personal history of transient ischemic attack (TIA), and cerebral infarction without residual deficits: Secondary | ICD-10-CM

## 2022-02-15 DIAGNOSIS — R918 Other nonspecific abnormal finding of lung field: Secondary | ICD-10-CM

## 2022-02-15 DIAGNOSIS — E440 Moderate protein-calorie malnutrition: Secondary | ICD-10-CM | POA: Diagnosis present

## 2022-02-15 DIAGNOSIS — I4891 Unspecified atrial fibrillation: Secondary | ICD-10-CM

## 2022-02-15 DIAGNOSIS — Z7982 Long term (current) use of aspirin: Secondary | ICD-10-CM

## 2022-02-15 DIAGNOSIS — I214 Non-ST elevation (NSTEMI) myocardial infarction: Secondary | ICD-10-CM

## 2022-02-15 DIAGNOSIS — C9210 Chronic myeloid leukemia, BCR/ABL-positive, not having achieved remission: Secondary | ICD-10-CM | POA: Diagnosis present

## 2022-02-15 DIAGNOSIS — R778 Other specified abnormalities of plasma proteins: Secondary | ICD-10-CM | POA: Diagnosis not present

## 2022-02-15 DIAGNOSIS — I714 Abdominal aortic aneurysm, without rupture, unspecified: Secondary | ICD-10-CM

## 2022-02-15 DIAGNOSIS — J9621 Acute and chronic respiratory failure with hypoxia: Secondary | ICD-10-CM

## 2022-02-15 DIAGNOSIS — N179 Acute kidney failure, unspecified: Secondary | ICD-10-CM | POA: Diagnosis present

## 2022-02-15 DIAGNOSIS — I4819 Other persistent atrial fibrillation: Secondary | ICD-10-CM | POA: Diagnosis present

## 2022-02-15 DIAGNOSIS — Z7901 Long term (current) use of anticoagulants: Secondary | ICD-10-CM

## 2022-02-15 DIAGNOSIS — Z20822 Contact with and (suspected) exposure to covid-19: Secondary | ICD-10-CM | POA: Diagnosis present

## 2022-02-15 DIAGNOSIS — Z9189 Other specified personal risk factors, not elsewhere classified: Secondary | ICD-10-CM

## 2022-02-15 DIAGNOSIS — Z950 Presence of cardiac pacemaker: Secondary | ICD-10-CM

## 2022-02-15 DIAGNOSIS — J84112 Idiopathic pulmonary fibrosis: Secondary | ICD-10-CM | POA: Diagnosis present

## 2022-02-15 DIAGNOSIS — E1122 Type 2 diabetes mellitus with diabetic chronic kidney disease: Secondary | ICD-10-CM | POA: Diagnosis present

## 2022-02-15 DIAGNOSIS — N1831 Chronic kidney disease, stage 3a: Secondary | ICD-10-CM | POA: Diagnosis present

## 2022-02-15 DIAGNOSIS — Z8616 Personal history of COVID-19: Secondary | ICD-10-CM

## 2022-02-15 DIAGNOSIS — J8489 Other specified interstitial pulmonary diseases: Secondary | ICD-10-CM | POA: Diagnosis present

## 2022-02-15 DIAGNOSIS — R7989 Other specified abnormal findings of blood chemistry: Secondary | ICD-10-CM | POA: Diagnosis present

## 2022-02-15 DIAGNOSIS — I495 Sick sinus syndrome: Secondary | ICD-10-CM | POA: Diagnosis present

## 2022-02-15 DIAGNOSIS — I7143 Infrarenal abdominal aortic aneurysm, without rupture: Secondary | ICD-10-CM | POA: Diagnosis present

## 2022-02-15 DIAGNOSIS — E785 Hyperlipidemia, unspecified: Secondary | ICD-10-CM | POA: Diagnosis present

## 2022-02-15 DIAGNOSIS — I2510 Atherosclerotic heart disease of native coronary artery without angina pectoris: Secondary | ICD-10-CM | POA: Diagnosis present

## 2022-02-15 DIAGNOSIS — R911 Solitary pulmonary nodule: Secondary | ICD-10-CM | POA: Diagnosis present

## 2022-02-15 DIAGNOSIS — E876 Hypokalemia: Secondary | ICD-10-CM | POA: Diagnosis present

## 2022-02-15 DIAGNOSIS — Z806 Family history of leukemia: Secondary | ICD-10-CM

## 2022-02-15 DIAGNOSIS — Z955 Presence of coronary angioplasty implant and graft: Secondary | ICD-10-CM

## 2022-02-15 DIAGNOSIS — Z9981 Dependence on supplemental oxygen: Secondary | ICD-10-CM

## 2022-02-15 DIAGNOSIS — I700 Atherosclerosis of aorta: Secondary | ICD-10-CM | POA: Diagnosis present

## 2022-02-15 DIAGNOSIS — I5043 Acute on chronic combined systolic (congestive) and diastolic (congestive) heart failure: Secondary | ICD-10-CM | POA: Diagnosis present

## 2022-02-15 DIAGNOSIS — C9590 Leukemia, unspecified not having achieved remission: Secondary | ICD-10-CM | POA: Diagnosis present

## 2022-02-15 DIAGNOSIS — R0609 Other forms of dyspnea: Secondary | ICD-10-CM | POA: Diagnosis not present

## 2022-02-15 DIAGNOSIS — Z79890 Hormone replacement therapy (postmenopausal): Secondary | ICD-10-CM

## 2022-02-15 DIAGNOSIS — Z87891 Personal history of nicotine dependence: Secondary | ICD-10-CM

## 2022-02-15 DIAGNOSIS — Z8249 Family history of ischemic heart disease and other diseases of the circulatory system: Secondary | ICD-10-CM

## 2022-02-15 DIAGNOSIS — N1832 Chronic kidney disease, stage 3b: Secondary | ICD-10-CM | POA: Diagnosis present

## 2022-02-15 DIAGNOSIS — Z79899 Other long term (current) drug therapy: Secondary | ICD-10-CM

## 2022-02-15 DIAGNOSIS — J9601 Acute respiratory failure with hypoxia: Secondary | ICD-10-CM

## 2022-02-15 LAB — I-STAT VENOUS BLOOD GAS, ED
Acid-base deficit: 3 mmol/L — ABNORMAL HIGH (ref 0.0–2.0)
Bicarbonate: 21.7 mmol/L (ref 20.0–28.0)
Calcium, Ion: 1.19 mmol/L (ref 1.15–1.40)
HCT: 46 % (ref 39.0–52.0)
Hemoglobin: 15.6 g/dL (ref 13.0–17.0)
O2 Saturation: 45 %
Potassium: 3.1 mmol/L — ABNORMAL LOW (ref 3.5–5.1)
Sodium: 144 mmol/L (ref 135–145)
TCO2: 23 mmol/L (ref 22–32)
pCO2, Ven: 35.4 mmHg — ABNORMAL LOW (ref 44–60)
pH, Ven: 7.395 (ref 7.25–7.43)
pO2, Ven: 25 mmHg — CL (ref 32–45)

## 2022-02-15 LAB — RESP PANEL BY RT-PCR (FLU A&B, COVID) ARPGX2
Influenza A by PCR: NEGATIVE
Influenza B by PCR: NEGATIVE
SARS Coronavirus 2 by RT PCR: NEGATIVE

## 2022-02-15 LAB — CBC WITH DIFFERENTIAL/PLATELET
Abs Immature Granulocytes: 0.04 10*3/uL (ref 0.00–0.07)
Basophils Absolute: 0.1 10*3/uL (ref 0.0–0.1)
Basophils Relative: 1 %
Eosinophils Absolute: 0.1 10*3/uL (ref 0.0–0.5)
Eosinophils Relative: 1 %
HCT: 44.6 % (ref 39.0–52.0)
Hemoglobin: 14.8 g/dL (ref 13.0–17.0)
Immature Granulocytes: 0 %
Lymphocytes Relative: 11 %
Lymphs Abs: 1 10*3/uL (ref 0.7–4.0)
MCH: 30.7 pg (ref 26.0–34.0)
MCHC: 33.2 g/dL (ref 30.0–36.0)
MCV: 92.5 fL (ref 80.0–100.0)
Monocytes Absolute: 0.7 10*3/uL (ref 0.1–1.0)
Monocytes Relative: 7 %
Neutro Abs: 7.5 10*3/uL (ref 1.7–7.7)
Neutrophils Relative %: 80 %
Platelets: 253 10*3/uL (ref 150–400)
RBC: 4.82 MIL/uL (ref 4.22–5.81)
RDW: 15.9 % — ABNORMAL HIGH (ref 11.5–15.5)
WBC: 9.4 10*3/uL (ref 4.0–10.5)
nRBC: 0 % (ref 0.0–0.2)

## 2022-02-15 LAB — LIPASE, BLOOD: Lipase: 67 U/L — ABNORMAL HIGH (ref 11–51)

## 2022-02-15 LAB — COMPREHENSIVE METABOLIC PANEL
ALT: 11 U/L (ref 0–44)
AST: 21 U/L (ref 15–41)
Albumin: 3.5 g/dL (ref 3.5–5.0)
Alkaline Phosphatase: 73 U/L (ref 38–126)
Anion gap: 10 (ref 5–15)
BUN: 7 mg/dL — ABNORMAL LOW (ref 8–23)
CO2: 20 mmol/L — ABNORMAL LOW (ref 22–32)
Calcium: 8.6 mg/dL — ABNORMAL LOW (ref 8.9–10.3)
Chloride: 109 mmol/L (ref 98–111)
Creatinine, Ser: 1.12 mg/dL (ref 0.61–1.24)
GFR, Estimated: >60 mL/min (ref >60)
Glucose, Bld: 101 mg/dL — ABNORMAL HIGH (ref 70–99)
Potassium: 3.1 mmol/L — ABNORMAL LOW (ref 3.5–5.1)
Sodium: 139 mmol/L (ref 135–145)
Total Bilirubin: 1.1 mg/dL (ref 0.3–1.2)
Total Protein: 6.9 g/dL (ref 6.5–8.1)

## 2022-02-15 LAB — D-DIMER, QUANTITATIVE: D-Dimer, Quant: 2.03 ug/mL-FEU — ABNORMAL HIGH (ref 0.00–0.50)

## 2022-02-15 LAB — TROPONIN I (HIGH SENSITIVITY)
Troponin I (High Sensitivity): 26 ng/L — ABNORMAL HIGH (ref <18)
Troponin I (High Sensitivity): 29 ng/L — ABNORMAL HIGH (ref <18)

## 2022-02-15 LAB — PROCALCITONIN: Procalcitonin: <0.1 ng/mL

## 2022-02-15 LAB — MAGNESIUM: Magnesium: 1.4 mg/dL — ABNORMAL LOW (ref 1.7–2.4)

## 2022-02-15 MED ORDER — HYDRALAZINE HCL 20 MG/ML IJ SOLN: 5.0000 mg | INTRAMUSCULAR | Status: DC | PRN

## 2022-02-15 MED ORDER — ONDANSETRON HCL 4 MG PO TABS: 4.0000 mg | ORAL_TABLET | Freq: Four times a day (QID) | ORAL | Status: DC | PRN

## 2022-02-15 MED ORDER — METOPROLOL SUCCINATE ER 100 MG PO TB24
200.0000 mg | ORAL_TABLET | Freq: Every day | ORAL | Status: DC
Start: 2022-02-16 — End: 2022-02-19
  Administered 2022-02-16 – 2022-02-19 (×4): 200 mg via ORAL
  Filled 2022-02-15 (×4): qty 2

## 2022-02-15 MED ORDER — MAGNESIUM SULFATE 2 GM/50ML IV SOLN
2.0000 g | Freq: Once | INTRAVENOUS | Status: AC
  Administered 2022-02-15: 2 g via INTRAVENOUS
  Filled 2022-02-15: qty 50

## 2022-02-15 MED ORDER — ONDANSETRON HCL 4 MG/2ML IJ SOLN: 4.0000 mg | Freq: Four times a day (QID) | INTRAMUSCULAR | Status: DC | PRN

## 2022-02-15 MED ORDER — PREDNISONE 20 MG PO TABS: 40.0000 mg | ORAL_TABLET | Freq: Every day | ORAL | Status: DC

## 2022-02-15 MED ORDER — RIVAROXABAN 20 MG PO TABS
20.0000 mg | ORAL_TABLET | Freq: Every day | ORAL | Status: DC
  Administered 2022-02-16: 20 mg via ORAL
  Filled 2022-02-15 (×2): qty 1

## 2022-02-15 MED ORDER — AMIODARONE HCL 100 MG PO TABS
100.0000 mg | ORAL_TABLET | Freq: Every day | ORAL | Status: DC
  Administered 2022-02-15 – 2022-02-18 (×4): 100 mg via ORAL
  Filled 2022-02-15 (×4): qty 1

## 2022-02-15 MED ORDER — LOSARTAN POTASSIUM 50 MG PO TABS
100.0000 mg | ORAL_TABLET | Freq: Every day | ORAL | Status: DC
  Administered 2022-02-15 – 2022-02-17 (×3): 100 mg via ORAL
  Filled 2022-02-15 (×3): qty 2

## 2022-02-15 MED ORDER — GUAIFENESIN ER 600 MG PO TB12: 600.0000 mg | ORAL_TABLET | Freq: Two times a day (BID) | ORAL | Status: DC | PRN

## 2022-02-15 MED ORDER — BISACODYL 5 MG PO TBEC: 5.0000 mg | DELAYED_RELEASE_TABLET | Freq: Every day | ORAL | Status: DC | PRN

## 2022-02-15 MED ORDER — ACETAMINOPHEN 325 MG PO TABS: 650.0000 mg | ORAL_TABLET | Freq: Four times a day (QID) | ORAL | Status: DC | PRN

## 2022-02-15 MED ORDER — LEVOTHYROXINE SODIUM 25 MCG PO TABS
25.0000 ug | ORAL_TABLET | Freq: Every day | ORAL | Status: DC
  Administered 2022-02-15 – 2022-02-19 (×5): 25 ug via ORAL
  Filled 2022-02-15 (×5): qty 1

## 2022-02-15 MED ORDER — DOCUSATE SODIUM 100 MG PO CAPS
100.0000 mg | ORAL_CAPSULE | Freq: Two times a day (BID) | ORAL | Status: DC
  Administered 2022-02-16 – 2022-02-18 (×4): 100 mg via ORAL
  Filled 2022-02-15 (×6): qty 1

## 2022-02-15 MED ORDER — ALBUTEROL SULFATE (2.5 MG/3ML) 0.083% IN NEBU: 2.5000 mg | INHALATION_SOLUTION | RESPIRATORY_TRACT | Status: DC | PRN

## 2022-02-15 MED ORDER — POTASSIUM CHLORIDE 10 MEQ/100ML IV SOLN
10.0000 meq | INTRAVENOUS | Status: AC
  Administered 2022-02-15 (×2): 10 meq via INTRAVENOUS
  Filled 2022-02-15 (×2): qty 100

## 2022-02-15 MED ORDER — POLYETHYLENE GLYCOL 3350 17 G PO PACK: 17.0000 g | PACK | Freq: Every day | ORAL | Status: DC | PRN

## 2022-02-15 MED ORDER — SODIUM CHLORIDE 0.9 % IV SOLN
2.0000 g | Freq: Three times a day (TID) | INTRAVENOUS | Status: DC
  Administered 2022-02-15 – 2022-02-16 (×2): 2 g via INTRAVENOUS
  Filled 2022-02-15 (×2): qty 2

## 2022-02-15 MED ORDER — METHYLPREDNISOLONE SODIUM SUCC 125 MG IJ SOLR
60.0000 mg | Freq: Two times a day (BID) | INTRAMUSCULAR | Status: AC
  Administered 2022-02-15 – 2022-02-16 (×2): 60 mg via INTRAVENOUS
  Filled 2022-02-15 (×2): qty 2

## 2022-02-15 MED ORDER — PANTOPRAZOLE SODIUM 40 MG PO TBEC
40.0000 mg | DELAYED_RELEASE_TABLET | Freq: Every day | ORAL | Status: DC
  Administered 2022-02-16 – 2022-02-19 (×4): 40 mg via ORAL
  Filled 2022-02-15 (×4): qty 1

## 2022-02-15 MED ORDER — FLUTICASONE PROPIONATE 50 MCG/ACT NA SUSP
2.0000 | Freq: Every day | NASAL | Status: DC
  Administered 2022-02-16 – 2022-02-19 (×4): 2 via NASAL
  Filled 2022-02-15: qty 16

## 2022-02-15 MED ORDER — OXYCODONE HCL 5 MG PO TABS: 5.0000 mg | ORAL_TABLET | ORAL | Status: DC | PRN

## 2022-02-15 MED ORDER — METHYLPREDNISOLONE SODIUM SUCC 125 MG IJ SOLR
60.0000 mg | Freq: Once | INTRAMUSCULAR | Status: AC
  Administered 2022-02-15: 60 mg via INTRAVENOUS
  Filled 2022-02-15: qty 2

## 2022-02-15 MED ORDER — AMLODIPINE BESYLATE 5 MG PO TABS
5.0000 mg | ORAL_TABLET | Freq: Every day | ORAL | Status: DC
Start: 2022-02-15 — End: 2022-02-17
  Administered 2022-02-15 – 2022-02-17 (×3): 5 mg via ORAL
  Filled 2022-02-15 (×3): qty 1

## 2022-02-15 MED ORDER — METOPROLOL TARTRATE 5 MG/5ML IV SOLN
5.0000 mg | Freq: Once | INTRAVENOUS | Status: AC
Start: 2022-02-15 — End: 2022-02-15
  Administered 2022-02-15: 5 mg via INTRAVENOUS
  Filled 2022-02-15: qty 5

## 2022-02-15 MED ORDER — ACETAMINOPHEN 650 MG RE SUPP: 650.0000 mg | Freq: Four times a day (QID) | RECTAL | Status: DC | PRN

## 2022-02-15 MED ORDER — MAGNESIUM OXIDE 400 MG PO TABS: 400.0000 mg | ORAL_TABLET | ORAL | Status: DC

## 2022-02-15 MED ORDER — MAGNESIUM OXIDE -MG SUPPLEMENT 400 (240 MG) MG PO TABS
400.0000 mg | ORAL_TABLET | ORAL | Status: DC
  Administered 2022-02-16 (×2): 400 mg via ORAL
  Filled 2022-02-15 (×3): qty 1

## 2022-02-15 MED ORDER — ATORVASTATIN CALCIUM 40 MG PO TABS
40.0000 mg | ORAL_TABLET | Freq: Every day | ORAL | Status: DC
Start: 2022-02-15 — End: 2022-02-19
  Administered 2022-02-15 – 2022-02-19 (×5): 40 mg via ORAL
  Filled 2022-02-15 (×5): qty 1

## 2022-02-15 MED ORDER — SODIUM CHLORIDE 0.9% FLUSH
3.0000 mL | Freq: Two times a day (BID) | INTRAVENOUS | Status: DC
  Administered 2022-02-16 – 2022-02-18 (×4): 3 mL via INTRAVENOUS

## 2022-02-15 MED ORDER — SODIUM CHLORIDE 0.9 % IV SOLN
100.0000 mg | Freq: Once | INTRAVENOUS | Status: AC
  Administered 2022-02-15: 100 mg via INTRAVENOUS
  Filled 2022-02-15: qty 100

## 2022-02-15 MED ORDER — RIVAROXABAN 20 MG PO TABS
20.0000 mg | ORAL_TABLET | Freq: Once | ORAL | Status: AC
  Administered 2022-02-15: 20 mg via ORAL
  Filled 2022-02-15: qty 1

## 2022-02-15 MED ORDER — IOHEXOL 350 MG/ML SOLN
100.0000 mL | Freq: Once | INTRAVENOUS | Status: AC | PRN
  Administered 2022-02-15: 100 mL via INTRAVENOUS

## 2022-02-15 MED ORDER — IPRATROPIUM-ALBUTEROL 0.5-2.5 (3) MG/3ML IN SOLN
3.0000 mL | Freq: Four times a day (QID) | RESPIRATORY_TRACT | Status: DC
  Administered 2022-02-16 (×2): 3 mL via RESPIRATORY_TRACT
  Filled 2022-02-15 (×2): qty 3

## 2022-02-15 MED ORDER — ASPIRIN 325 MG PO TABS
325.0000 mg | ORAL_TABLET | Freq: Once | ORAL | Status: AC
  Administered 2022-02-15: 325 mg via ORAL
  Filled 2022-02-15: qty 1

## 2022-02-15 MED ORDER — IPRATROPIUM-ALBUTEROL 0.5-2.5 (3) MG/3ML IN SOLN
3.0000 mL | Freq: Once | RESPIRATORY_TRACT | Status: AC
  Administered 2022-02-15: 3 mL via RESPIRATORY_TRACT
  Filled 2022-02-15: qty 3

## 2022-02-15 MED ORDER — METOPROLOL SUCCINATE ER 25 MG PO TB24
100.0000 mg | ORAL_TABLET | Freq: Once | ORAL | Status: AC
Start: 2022-02-15 — End: 2022-02-15
  Administered 2022-02-15: 100 mg via ORAL
  Filled 2022-02-15: qty 4

## 2022-02-15 NOTE — ED Provider Notes (Signed)
[facility] Provider Note   CSN: [id number] Arrival date & time: 02/15/22  1027     History  Chief Complaint  Patient presents with   Shortness of Breath    Jason [name] is a 76 y.o. male.  This is a 76 y.o. male  with significant medical history as below, including hypertension, hyperlipidemia, home oxygen use, CML, paroxysmal atrial fibrillation, symptomatic bradycardia status post pacemaker 2017 who presents to the ED with complaint of difficulty breathing.  Symptoms worsened in the past 1.5 to 2 weeks. Patiently typically can ambulate to his mailbox with mild dyspnea.  In the past 3 to 4 days he has been unable to ambulate to the restroom without profound dyspnea.  He uses oxygen as needed 2 L.  Has had to use the oxygen 24/7 the past 3 days.  Mild leg swelling over the past week, worsened from baseline.  Left leg.  Patient denies change to his baseline cough.  No fevers or chills. He has mild chest tightness associated with deep inspiration.  He feels as though his abdomen is "bloated."  No change to bowel or bladder function.  No nausea or vomiting.  Not take his morning medications including beta-blocker and Xarelto.  In triage patient profoundly hypoxic, diaphoretic, dyspneic, pulse ox mid 70s.      Past Medical History: 05/08/2015: Anemia 12/16/2011: CML (chronic myelocytic leukemia) (HCC) No date: Coronary heart disease No date: CVA (cerebrovascular accident) ([facility]) No date: GERD (gastroesophageal reflux disease) 03/18/2014: Grief reaction 02/24/2012: H/O tobacco use, presenting hazards to health     Comment:  No cigarettes since pacemaker placement. Encouraged               ongoing efforts  No date: History of chicken pox No date: History of kidney stones No date: Hyperlipidemia No date: Hypertension 09/20/2013: IBS (irritable bowel syndrome) 03/23/2014: Medicare annual wellness visit, subsequent No date: PAF (paroxysmal atrial  fibrillation) ([place]) No date: Symptomatic bradycardia     Comment:  a. symptomatic bradycardia due to sinus node dysfunction              s/p RA and RV Medtronic pacemaker 04/15/16  Past Surgical History: No date: CATARACT EXTRACTION 2003: CHOLECYSTECTOMY 2000: [place] 04/15/2016: EP IMPLANTABLE DEVICE; N/A     Comment:  Procedure: Pacemaker Implant;  Surgeon: Evans Lance,               MD; Medtronic (serial number [id number]) pacemaker;               Laterality: Left    The history is provided by the patient and a relative. No language interpreter was used.  Shortness of Breath Severity:  Severe Onset quality:  Gradual Timing:  Constant Progression:  Worsening Chronicity:  Recurrent Context: activity   Associated symptoms: chest pain, cough and fever   Associated symptoms: no abdominal pain, no headaches, no rash and no vomiting       Home Medications Prior to Admission medications   Medication Sig Start Date End Date Taking? Authorizing Provider  albuterol (VENTOLIN HFA) 108 (90 Base) MCG/ACT inhaler Inhale 2 puffs into the lungs every 6 (six) hours as needed. 12/31/21   Saguier, Percell Miller, PA-C  amiodarone (PACERONE) 200 MG tablet Take 1/2 (one-half) tablet by mouth once daily Patient taking differently: Take 100 mg by mouth daily. 10/08/21   Camnitz, Will Hassell Done, MD  amLODipine (NORVASC) 5 MG tablet Take 1 tablet by mouth once  daily 01/20/22   Lelon Perla, MD  aspirin EC 81 MG tablet Take 81 mg by mouth daily as needed for mild pain.    [provider]  atorvastatin (LIPITOR) 40 MG tablet Take 1 tablet (40 mg total) by mouth daily. 10/12/21 10/12/22  Camnitz, Ocie Doyne, MD  dexamethasone (DECADRON) 6 MG tablet Take 1 tablet (6 mg total) by mouth daily. 12/19/21   Danford, Suann Larry, MD  ferrous sulfate 325 (65 FE) MG EC tablet Take 1 tablet (325 mg total) by mouth 2 (two) times daily. Patient taking differently: Take 325 mg  by mouth daily. 09/02/15   Irene Shipper, MD  fluticasone [facility]) 50 MCG/ACT nasal spray Place 2 sprays into both nostrils daily. 07/07/21   Mosie Lukes, MD  imatinib ([name]) 400 MG tablet TAKE 1 TABLET (400MG  TOTAL) BY MOUTH DAILY. TAKE WITH A MEAL AND LARGE GLASS OF WATER. Patient taking differently: Take 400 mg by mouth daily. 10/03/21   Volanda Napoleon, MD  levothyroxine (SYNTHROID) 25 MCG tablet Take 1 tablet (25 mcg total) by mouth daily before breakfast. 01/20/22   Mosie Lukes, MD  losartan (COZAAR) 100 MG tablet Take 1 tablet (100 mg total) by mouth daily. 07/27/21   Mosie Lukes, MD  magnesium oxide (MAG-OX) 400 MG tablet Take 1 tablet (400 mg total) by mouth daily. Except Tuesday and Saturday take it BID. Patient taking differently: Take 400 mg by mouth daily. 11/17/21   Mosie Lukes, MD  meclizine (ANTIVERT) 25 MG tablet TAKE 1 TABLET THREE TIMES DAILY Patient taking differently: Take 25 mg by mouth 3 (three) times daily. 09/14/19   Mosie Lukes, MD  metoprolol succinate (TOPROL-XL) 200 MG 24 hr tablet Take 1 tablet (200 mg total) by mouth daily. Take with or immediately following a meal. 02/10/22   Camnitz, Ocie Doyne, MD  nitroGLYCERIN (NITROSTAT) 0.4 MG SL tablet Place 1 tablet (0.4 mg total) under the tongue every 5 (five) minutes as needed for chest pain. 08/18/21   Mosie Lukes, MD  omeprazole (PRILOSEC) 40 MG capsule Take 1 capsule (40 mg total) by mouth daily. 02/09/22   Mosie Lukes, MD  ondansetron (ZOFRAN) 4 MG tablet Take 1 tablet (4 mg total) by mouth 2 (two) times daily as needed for nausea or vomiting. 07/02/21   Volanda Napoleon, MD  potassium chloride SA (KLOR-CON) 20 MEQ tablet Take 1 tablet (20 mEq total) by mouth 3 (three) times daily. 07/31/21   Charlesetta Shanks, MD  rivaroxaban (XARELTO) 20 MG TABS tablet TAKE 1 TABLET BY MOUTH ONCE DAILY WITH SUPPER Patient taking differently: 20 mg daily. 10/26/21   Camnitz, Ocie Doyne, MD  Saccharomyces boulardii  (PROBIOTIC) 250 MG CAPS Take 250 mg by mouth daily. 01/20/21   Mosie Lukes, MD  vitamin B-12 (CYANOCOBALAMIN) 500 MCG tablet Take 500 mcg by mouth daily.    [provider]  Vitamin D, Ergocalciferol, (DRISDOL) 1.25 MG (50000 UNIT) CAPS capsule Take 1 capsule (50,000 Units total) by mouth every 7 (seven) days. 11/17/21   Mosie Lukes, MD      Allergies    Patient has no known allergies.    Review of Systems   Review of Systems  Constitutional:  Positive for activity change and fever. Negative for chills.  HENT:  Negative for facial swelling and trouble swallowing.   Eyes:  Negative for photophobia and visual disturbance.  Respiratory:  Positive for cough and shortness of breath.  Cardiovascular:  Positive for chest pain and leg swelling. Negative for palpitations.  Gastrointestinal:  Negative for abdominal pain, nausea and vomiting.  Endocrine: Negative for polydipsia and polyuria.  Genitourinary:  Negative for difficulty urinating and hematuria.  Musculoskeletal:  Negative for gait problem and joint swelling.  Skin:  Negative for pallor and rash.  Neurological:  Negative for syncope and headaches.  Psychiatric/Behavioral:  Negative for agitation and confusion.    Physical Exam Updated Vital Signs BP 140/73    Pulse 88    Temp (!) 97.4 F (36.3 C) (Oral)    Resp (!) 24    Ht 5\' 8"  (1.727 m)    Wt 94 kg    SpO2 96%    BMI 31.51 kg/m  Physical Exam Vitals and nursing note reviewed.  Constitutional:      General: He is in acute distress.     Appearance: He is well-developed. He is ill-appearing and diaphoretic.  HENT:     Head: Normocephalic and atraumatic.     Right Ear: External ear normal.     Left Ear: External ear normal.     Mouth/Throat:     Mouth: Mucous membranes are moist.  Eyes:     General: No scleral icterus. Cardiovascular:     Rate and Rhythm: Tachycardia present. Rhythm irregular.     Pulses: Normal pulses.     Heart sounds: Normal heart  sounds.  Pulmonary:     Effort: Tachypnea and accessory muscle usage present. No respiratory distress.     Breath sounds: Decreased air movement present. Decreased breath sounds and wheezing present.  Chest:    Abdominal:     General: Abdomen is flat.     Palpations: Abdomen is soft.     Tenderness: There is no abdominal tenderness.  Musculoskeletal:        General: Normal range of motion.     Cervical back: Normal range of motion.     Right lower leg: No edema.     Left lower leg: No edema.  Skin:    General: Skin is warm.     Capillary Refill: Capillary refill takes less than 2 seconds.  Neurological:     Mental Status: He is alert and oriented to person, place, and time.  Psychiatric:        Mood and Affect: Mood normal.        Behavior: Behavior normal.    ED Results / Procedures / Treatments   Labs (all labs ordered are listed, but only abnormal results are displayed) Labs Reviewed  CBC WITH DIFFERENTIAL/PLATELET - Abnormal; Notable for the following components:      Result Value   RDW 15.9 (*)    All other components within normal limits  COMPREHENSIVE METABOLIC PANEL - Abnormal; Notable for the following components:   Potassium 3.1 (*)    CO2 20 (*)    Glucose, Bld 101 (*)    BUN 7 (*)    Calcium 8.6 (*)    All other components within normal limits  LIPASE, BLOOD - Abnormal; Notable for the following components:   Lipase 67 (*)    All other components within normal limits  D-DIMER, QUANTITATIVE - Abnormal; Notable for the following components:   D-Dimer, Quant 2.03 (*)    All other components within normal limits  MAGNESIUM - Abnormal; Notable for the following components:   Magnesium 1.4 (*)    All other components within normal limits  I-STAT VENOUS BLOOD GAS, ED - Abnormal;  Notable for the following components:   pCO2, Ven 35.4 (*)    pO2, Ven 25 (*)    Acid-base deficit 3.0 (*)    Potassium 3.1 (*)    All other components within normal limits   TROPONIN I (HIGH SENSITIVITY) - Abnormal; Notable for the following components:   Troponin I (High Sensitivity) 26 (*)    All other components within normal limits  TROPONIN I (HIGH SENSITIVITY) - Abnormal; Notable for the following components:   Troponin I (High Sensitivity) 29 (*)    All other components within normal limits  RESP PANEL BY RT-PCR (FLU A&B, COVID) [id number]  PROCALCITONIN    EKG EKG Interpretation  Date/Time:  Monday February 15 2022 09:04:06 EST Ventricular Rate:  144 PR Interval:  120 QRS Duration: 140 QT Interval:  368 QTC Calculation: 570 R Axis:   -84 Text Interpretation: afib with rvr Right bundle branch block similar to prior Confirmed by Wynona Dove ([id number]) on 02/15/2022 12:23:16 PM  Radiology CT Angio Chest PE W and/or Wo Contrast  Result Date: 02/15/2022 CLINICAL DATA:  76 year old male with history of acute onset of nonlocalized abdominal pain, hypoxia and increased work of breathing. Additional history of chronic myelocytic leukemia (CML). EXAM: CT ANGIOGRAPHY CHEST CT ABDOMEN AND PELVIS WITH CONTRAST TECHNIQUE: Multidetector CT imaging of the chest was performed using the standard protocol during bolus administration of intravenous contrast. Multiplanar CT image reconstructions and MIPs were obtained to evaluate the vascular anatomy. Multidetector CT imaging of the abdomen and pelvis was performed using the standard protocol during bolus administration of intravenous contrast. RADIATION DOSE REDUCTION: This exam was performed according to the departmental dose-optimization program which includes automated exposure control, adjustment of the mA and/or kV according to patient size and/or use of iterative reconstruction technique. CONTRAST:  163mL OMNIPAQUE IOHEXOL 350 MG/ML SOLN COMPARISON:  CT of the chest, abdomen and pelvis 12/15/2021. FINDINGS: CTA CHEST FINDINGS Cardiovascular: There are no filling defects within the pulmonary arterial tree to suggest pulmonary  embolism. Heart size is normal. There is no significant pericardial fluid, thickening or pericardial calcification. There is aortic atherosclerosis, as well as atherosclerosis of the great vessels of the mediastinum and the coronary arteries, including calcified atherosclerotic plaque in the left main, left anterior descending, left circumflex and right coronary arteries. Left-sided pacemaker device in place with lead tips terminating in the right atrium and right ventricle. Mediastinum/Nodes: No pathologically enlarged mediastinal or hilar lymph nodes. Hilar esophagus is unremarkable in appearance. No axillary lymphadenopathy. Lungs/Pleura: There are some small nodules scattered throughout the left upper lobe, similar to the prior study, largest of which (axial image 42 of series 6) measures 9 x 5 mm (mean diameter of 7 mm). No other new suspicious appearing pulmonary nodules or masses are noted. There are widespread areas of ground-glass attenuation and septal thickening in the lungs bilaterally (right greater than left), with some associated areas of peripheral bronchiolectasis. These findings are unchanged. Mild centrilobular and paraseptal emphysema, most apparent in the lung apices. No confluent consolidative airspace disease. Trace right pleural effusion. No left pleural effusion. Musculoskeletal: There are no aggressive appearing lytic or blastic lesions noted in the visualized portions of the skeleton. Review of the MIP images confirms the above findings. CT ABDOMEN and PELVIS FINDINGS Hepatobiliary: No suspicious cystic or solid hepatic lesions. No intra or extrahepatic biliary ductal dilatation. Status post cholecystectomy. Pancreas: Large coarse calcification in the proximal pancreatic body. No pancreatic mass. No pancreatic ductal dilatation. No peripancreatic fluid collections or inflammatory  changes. Spleen: Unremarkable. Adrenals/Urinary Tract: Subcentimeter low-attenuation lesions in both kidneys,  too small to characterize, but similar to prior studies and statistically likely to represent tiny cysts. No aggressive appearing renal lesions. No hydroureteronephrosis. Urinary bladder is normal in appearance. Bilateral adrenal glands are normal in appearance. Stomach/Bowel: The appearance of the stomach is normal. No pathologic dilatation of small bowel or colon. Normal appendix. Vascular/Lymphatic: Aortic atherosclerosis with fusiform aneurysmal dilatation of the infrarenal abdominal aorta which measures up to 4.9 x 4.5 cm (axial image 36 of series 2), with large burden of atheromatous plaque and/or mural thrombus in the aneurysmally dilated portion. No lymphadenopathy noted in the abdomen or pelvis. Reproductive: Prostate gland and seminal vesicles are unremarkable in appearance. Other: Trace volume of ascites.  No pneumoperitoneum. Musculoskeletal: There are no aggressive appearing lytic or blastic lesions noted in the visualized portions of the skeleton. Review of the MIP images confirms the above findings. IMPRESSION: 1. No evidence of pulmonary embolism. 2. No acute findings are noted in the chest, abdomen or pelvis to account for the patient's symptoms. 3. Unusual appearance of the lungs with asymmetrically distributed fibrotic changes (involving the right lung to a greater extent than the left), stable compared to the prior examination, as detailed above. 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 4. Scattered areas of nodularity in the lungs, stable compared to the prior study, favored to be benign, but continued attention on routine follow-up studies is recommended to ensure stability. The largest of these pulmonary nodules in the left upper lobe again demonstrates a mean diameter of 7 mm. 5. Aortic atherosclerosis, in addition to left main and  three-vessel coronary artery disease. Fusiform aneurysmal dilatation of the infrarenal abdominal aorta which measures up to 4.9 x 4.5 cm. Recommend follow-up CT/MR every 6 months and vascular consultation. This recommendation follows ACR consensus guidelines: White Paper of the ACR Incidental Findings Committee II on Vascular Findings. J Am Coll Radiol 2013; 10:789-794. 6. Diffuse bronchial wall thickening with mild centrilobular and paraseptal emphysema; imaging findings suggestive of underlying COPD. 7. Additional incidental findings, as above. Electronically Signed   By: Vinnie Langton M.D.   On: 02/15/2022 10:50   CT ABDOMEN PELVIS W CONTRAST  Result Date: 02/15/2022 CLINICAL DATA:  76 year old male with history of acute onset of nonlocalized abdominal pain, hypoxia and increased work of breathing. Additional history of chronic myelocytic leukemia (CML). EXAM: CT ANGIOGRAPHY CHEST CT ABDOMEN AND PELVIS WITH CONTRAST TECHNIQUE: Multidetector CT imaging of the chest was performed using the standard protocol during bolus administration of intravenous contrast. Multiplanar CT image reconstructions and MIPs were obtained to evaluate the vascular anatomy. Multidetector CT imaging of the abdomen and pelvis was performed using the standard protocol during bolus administration of intravenous contrast. RADIATION DOSE REDUCTION: This exam was performed according to the departmental dose-optimization program which includes automated exposure control, adjustment of the mA and/or kV according to patient size and/or use of iterative reconstruction technique. CONTRAST:  114mL OMNIPAQUE IOHEXOL 350 MG/ML SOLN COMPARISON:  CT of the chest, abdomen and pelvis 12/15/2021. FINDINGS: CTA CHEST FINDINGS Cardiovascular: There are no filling defects within the pulmonary arterial tree to suggest pulmonary embolism. Heart size is normal. There is no significant pericardial fluid, thickening or pericardial calcification. There is  aortic atherosclerosis, as well as atherosclerosis of the great vessels of the mediastinum and the coronary arteries, including calcified atherosclerotic plaque in the  left main, left anterior descending, left circumflex and right coronary arteries. Left-sided pacemaker device in place with lead tips terminating in the right atrium and right ventricle. Mediastinum/Nodes: No pathologically enlarged mediastinal or hilar lymph nodes. Hilar esophagus is unremarkable in appearance. No axillary lymphadenopathy. Lungs/Pleura: There are some small nodules scattered throughout the left upper lobe, similar to the prior study, largest of which (axial image 42 of series 6) measures 9 x 5 mm (mean diameter of 7 mm). No other new suspicious appearing pulmonary nodules or masses are noted. There are widespread areas of ground-glass attenuation and septal thickening in the lungs bilaterally (right greater than left), with some associated areas of peripheral bronchiolectasis. These findings are unchanged. Mild centrilobular and paraseptal emphysema, most apparent in the lung apices. No confluent consolidative airspace disease. Trace right pleural effusion. No left pleural effusion. Musculoskeletal: There are no aggressive appearing lytic or blastic lesions noted in the visualized portions of the skeleton. Review of the MIP images confirms the above findings. CT ABDOMEN and PELVIS FINDINGS Hepatobiliary: No suspicious cystic or solid hepatic lesions. No intra or extrahepatic biliary ductal dilatation. Status post cholecystectomy. Pancreas: Large coarse calcification in the proximal pancreatic body. No pancreatic mass. No pancreatic ductal dilatation. No peripancreatic fluid collections or inflammatory changes. Spleen: Unremarkable. Adrenals/Urinary Tract: Subcentimeter low-attenuation lesions in both kidneys, too small to characterize, but similar to prior studies and statistically likely to represent tiny cysts. No aggressive  appearing renal lesions. No hydroureteronephrosis. Urinary bladder is normal in appearance. Bilateral adrenal glands are normal in appearance. Stomach/Bowel: The appearance of the stomach is normal. No pathologic dilatation of small bowel or colon. Normal appendix. Vascular/Lymphatic: Aortic atherosclerosis with fusiform aneurysmal dilatation of the infrarenal abdominal aorta which measures up to 4.9 x 4.5 cm (axial image 36 of series 2), with large burden of atheromatous plaque and/or mural thrombus in the aneurysmally dilated portion. No lymphadenopathy noted in the abdomen or pelvis. Reproductive: Prostate gland and seminal vesicles are unremarkable in appearance. Other: Trace volume of ascites.  No pneumoperitoneum. Musculoskeletal: There are no aggressive appearing lytic or blastic lesions noted in the visualized portions of the skeleton. Review of the MIP images confirms the above findings. IMPRESSION: 1. No evidence of pulmonary embolism. 2. No acute findings are noted in the chest, abdomen or pelvis to account for the patient's symptoms. 3. Unusual appearance of the lungs with asymmetrically distributed fibrotic changes (involving the right lung to a greater extent than the left), stable compared to the prior examination, as detailed above. 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 4. Scattered areas of nodularity in the lungs, stable compared to the prior study, favored to be benign, but continued attention on routine follow-up studies is recommended to ensure stability. The largest of these pulmonary nodules in the left upper lobe again demonstrates a mean diameter of 7 mm. 5. Aortic atherosclerosis, in addition to left main and three-vessel coronary artery disease. Fusiform aneurysmal dilatation of the infrarenal abdominal aorta which measures up to  4.9 x 4.5 cm. Recommend follow-up CT/MR every 6 months and vascular consultation. This recommendation follows ACR consensus guidelines: White Paper of the ACR Incidental Findings Committee II on Vascular Findings. J Am Coll Radiol 2013; 10:789-794. 6. Diffuse bronchial wall thickening with mild centrilobular and paraseptal emphysema; imaging findings suggestive of underlying COPD. 7. Additional incidental findings, as above. Electronically Signed   By: Vinnie Langton  M.D.   On: 02/15/2022 10:50   DG Chest Portable 1 View  Result Date: 02/15/2022 CLINICAL DATA:  Hypoxia, increased work of breathing EXAM: PORTABLE CHEST 1 VIEW COMPARISON:  Chest radiograph 12/15/2021 FINDINGS: A left chest wall cardiac device is stable. The cardiomediastinal silhouette is stable. There are increased interstitial markings in both lungs, more so on the right, consistent with fibrotic change as seen on prior CT. There is no definite new superimposed focal airspace disease. There is no pleural effusion or pneumothorax There is no acute osseous abnormality. IMPRESSION: Coarse interstitial markings in both lungs consistent with chronic/fibrotic changes, without definite new superimposed focal airspace disease. Electronically Signed   By: Valetta Mole M.D.   On: 02/15/2022 09:40    Procedures .Critical Care Performed by: Jeanell Sparrow, DO Authorized by: Jeanell Sparrow, DO   Critical care provider statement:    Critical care time (minutes):  80   Critical care time was exclusive of:  Separately billable procedures and treating other patients   Critical care was necessary to treat or prevent imminent or life-threatening deterioration of the following conditions:  Cardiac failure and respiratory failure   Critical care was time spent personally by me on the following activities:  Development of treatment plan with patient or surrogate, discussions with consultants, evaluation of patient's response to treatment, examination of  patient, ordering and review of laboratory studies, ordering and review of radiographic studies, ordering and performing treatments and interventions, pulse oximetry, re-evaluation of patient's condition and review of old charts   Care discussed with: admitting provider      Medications Ordered in ED Medications  potassium chloride 10 mEq in 100 mL IVPB (10 mEq Intravenous New Bag/Given 02/15/22 1157)  magnesium sulfate IVPB 2 g 50 mL (has no administration in time range)  doxycycline (VIBRAMYCIN) 100 mg in sodium chloride 0.9 % 250 mL IVPB (has no administration in time range)  methylPREDNISolone sodium succinate (SOLU-MEDROL) 125 mg/2 mL injection 60 mg (60 mg Intravenous Given 02/15/22 0939)  ipratropium-albuterol (DUONEB) 0.5-2.5 (3) MG/3ML nebulizer solution 3 mL (3 mLs Nebulization Given 02/15/22 0942)  metoprolol tartrate (LOPRESSOR) injection 5 mg (5 mg Intravenous Given 02/15/22 0938)  iohexol (OMNIPAQUE) 350 MG/ML injection 100 mL (100 mLs Intravenous Contrast Given 02/15/22 1018)  aspirin tablet 325 mg (325 mg Oral Given 02/15/22 1111)  metoprolol succinate (TOPROL-XL) 24 hr tablet 100 mg (100 mg Oral Given 02/15/22 1200)  rivaroxaban (XARELTO) tablet 20 mg (20 mg Oral Given 02/15/22 1201)    ED Course/ Medical Decision Making/ A&P                           Medical Decision Making Amount and/or Complexity of Data Reviewed Independent Historian: spouse External Data Reviewed: labs, radiology, ECG and notes. Labs: ordered. Decision-making details documented in ED Course. Radiology: ordered. Decision-making details documented in ED Course. ECG/medicine tests: ordered and independent interpretation performed. Decision-making details documented in ED Course.  Risk OTC drugs. Prescription drug management. Decision regarding hospitalization.    CC: Dyspnea, chest pain  This patient presents to the Emergency Department for the above complaint. This involves an extensive number of  treatment options and is a complaint that carries with it a high risk of complications and morbidity. Vital signs were reviewed. Serious etiologies considered.  Patient hypoxic on arrival, pulse ox mid 70s.  Placed on nasal cannula nonrebreather.  Immediately roomed.  Respiratory status improved with oxygen support.  Heart rate  elevated 150s to 160s, appears to be atrial fibrillation with RVR on telemetry.   Record review:  Previous records obtained and reviewed   Additional history obtained from family at bedside  Medical and surgical history as noted above.   Work up as above, notable for:  Labs & imaging results that were available during my care of the patient were reviewed by me and considered in my medical decision making.   I ordered imaging studies which included CT PE, CT abdomen pelvis, chest x-ray and I reviewed imaging which showed chest x-ray consistent with chronic lung disease, CT she has fine since it was COPD, pulmonary nodules, aortic atherosclerosis, infrarenal abdominal aortic dilation without rupture  Cardiac monitoring reviewed and interpreted personally which shows atrial fibrillation with RVR, pacer spikes noted intermittently.  Social determinants of health include - N/a  Labs reviewed, patient with elevated troponin, EKG consistent with A-fib with RVR.  Possibly due to demand ischemia given respiratory distress.  We will continue to trend.  Give aspirin  Management: Patient given nebulized breathing treatment.  Supplemental oxygen   Patient likely COPD exacerbation.  Give doxycycline.  Steroids, nebulized breathing treatments.  Heart rate improved after Lopressor.  Blood pressure stable  Patient is anticoagulated on Xarelto, last dose yesterday morning.  Resume anticoagulation  Reassessment:  Respiratory status improved on 4 L nasal cannula.  Heart rate improved after Lopressor.  We will give home dose of Lopressor and Xarelto.   Recommend admission  for likely COPD exacerbation, acute respiratory failure with hypoxia.  NSTEMI.  Patient is agreeable.    Discussed with Dr. Lorin Mercy who accepts patient for admission. HDS. Continue [place] 3-4 L continuous.     This chart was dictated using voice recognition software.  Despite best efforts to proofread,  errors can occur which can change the documentation meaning.         Final Clinical Impression(s) / ED Diagnoses Final diagnoses:  Acute respiratory failure with hypoxia (HCC)  Hypokalemia  COPD exacerbation (HCC)  Atrial fibrillation with RVR (HCC)  NSTEMI (non-ST elevated myocardial infarction) ([place])  Abdominal aortic aneurysm (AAA) without rupture, unspecified part  Pulmonary nodules    Rx / DC Orders ED Discharge Orders     None         Jeanell Sparrow, DO 02/15/22 1224

## 2022-02-15 NOTE — Progress Notes (Signed)
Patient has arrived from [facility]. Patient is alert and oriented; no pain; no skin issues. Currently on 4L [place]. Dr. Lorin Mercy notified of arrival.

## 2022-02-15 NOTE — Assessment & Plan Note (Addendum)
Patient was started on IV diuresis while hospitalized due to decompensated CHF with bump in creatinine due to aggressive IV diuresis.  Creatinine peaked during hospitalization to 1.5 to.  Creatinine 1.37 at time of discharge.  Recommend BMP in next PCP/specialist visit.

## 2022-02-15 NOTE — Progress Notes (Signed)
Plan of Care Note for accepted transfer   Patient: Jason [name] MRN: [id number]   [place]: 02/15/2022  Facility requesting transfer: [facility] Requesting Provider: Pearline Cables Reason for transfer: COPD exacerbation Facility course: Patient with h/o CML (2012); CAD; CVA; HTN; HLD; COPD on home O2; and afib on Xarelto with pacemaker presenting with SOB.  Acute respiratory distress, profound dyspnea with walking to bathroom.  In 61s on presentation.  Currently on 3-4L, down from NR[place] + [place] O2.  Given Lopressor and now rate controlled.  Troponin in 20s, likely demand ischemia.  CTA COPD changes.  Given nebs, steroids, Doxy.     Plan of care: The patient is accepted for admission to Telemetry unit, at [facility] or [facility].  Author: Karmen Bongo, MD 02/15/2022  Check [email or web address] for on-call coverage.  Nursing staff, Please call [place] number on Amion as soon as patient's arrival, so appropriate admitting provider can evaluate the pt.

## 2022-02-15 NOTE — Assessment & Plan Note (Addendum)
Appears to be stable, also with NSIP and scattered nodularity on CT angiogram chest.  Consider outpatient pulmonology referral for further evaluation.

## 2022-02-15 NOTE — ED Notes (Signed)
Patient transported to CT 

## 2022-02-15 NOTE — Assessment & Plan Note (Deleted)
Acute on chronic respiratory failure associated with a COPD exacerbation -Patient's shortness of breath and hypoxia (in 70s on presentation, per EDP) are most likely caused by acute COPD exacerbation.  -He has history of O2-dependent COPD but uses 2L O2 only prn; has been requiring continuous O2 since last night and 4L while at the ER -He does not have fever or leukocytosis.  -Chest x-ray/CTA are not consistent with pneumonia -will admit patient - with his markedly decreased O2 sats (into the 70s), it seems likely that he will need several days of hospitalization to show sufficient improvement for discharge. -Nebulizers: scheduled Duoneb and prn albuterol -Solu-Medrol 60 mg IV BID -> Prednisone 40 mg PO daily -IV Cefepime -He may benefit from addition of controlling medication as an outpatient -Coordinated care with [facility] team/PT/OT/Nutrition/RT consults

## 2022-02-15 NOTE — ED Triage Notes (Signed)
Presents with hypoxia, increased work of breathing, sinus tach noted, afebrile. On home oxygen as needed. States 2 days ago, his shortness of breath had become worse

## 2022-02-15 NOTE — ED Notes (Signed)
Phone Handoff Report given to rec RN at main [place] Unit

## 2022-02-15 NOTE — Assessment & Plan Note (Addendum)
Needs imaging every 6 months and outpatient vascular consultation

## 2022-02-15 NOTE — H&P (Signed)
History and Physical    Patient: Jason [name] MGQ:[id number] DOB: April 03, 1946 DOA: 02/15/2022 DOS: the patient was seen and examined on 02/15/2022 PCP: Mosie Lukes, MD  Patient coming from: Home - lives with granddaughter and grandson; NOK: Ledell Peoples, [phone]   Chief Complaint: SOB  HPI: Jason [name] is a 76 y.o. male with medical history significant of CML (2012); CAD; CVA; HTN; HLD; COPD on home O2; and afib on Xarelto with pacemaker presenting with SOB.  He reports that he was hospitalized in December and he was diagnosed with COVID; he was discharged on 12/24.  He seemed to be doing ok, was using O2 at home.  He noticed a couple of days ago that he was  a little worse - going from bathroom to living room would make him out of breath despite O2.  He was using the O2 prn but started using it all night yesterday but was still out of breath this AM and so he came in.  He noticed L hand and foot swelling but this is improved.  No change in his cough.  No fever.  +rhinorrhea, worse than usual.  No sick contacts.  He quit smoking in 2017.     ER Course:  [facility] to [facility] transfer:  Acute respiratory distress, profound dyspnea with walking to bathroom.  In 13s on presentation.  Currently on 3-4L, down from NRB + [place] O2.  Given Lopressor and now rate controlled.  Troponin in 20s, likely demand ischemia.  CTA COPD changes.  Given nebs, steroids, Doxy.       Review of Systems: As mentioned in the history of present illness. All other systems reviewed and are negative. Past Medical History:  Diagnosis Date   Anemia 05/08/2015   CML (chronic myelocytic leukemia) ([place]) 12/16/2011   Coronary heart disease    CVA (cerebrovascular accident) ([place])    GERD (gastroesophageal reflux disease)    Grief reaction 03/18/2014   H/O tobacco use, presenting hazards to health 02/24/2012   No cigarettes since pacemaker placement. Encouraged ongoing efforts    History of chicken pox     History of kidney stones    Hyperlipidemia    Hypertension    IBS (irritable bowel syndrome) 09/20/2013   Medicare annual wellness visit, subsequent 03/23/2014   PAF (paroxysmal atrial fibrillation) (HCC)    Symptomatic bradycardia    a. symptomatic bradycardia due to sinus node dysfunction s/p RA and RV Medtronic pacemaker 04/15/16   Past Surgical History:  Procedure Laterality Date   CATARACT EXTRACTION     CHOLECYSTECTOMY  2003   CORONARY ANGIOPLASTY WITH STENT PLACEMENT  2000   EP IMPLANTABLE DEVICE N/A 04/15/2016   Procedure: Pacemaker Implant;  Surgeon: Evans Lance, MD; Medtronic (serial number [id number]) pacemaker; Laterality: Left   Social History:  reports that he quit smoking about 5 years ago. His smoking use included cigarettes. He started smoking about 54 years ago. He has a 21.00 pack-year smoking history. He has never used smokeless tobacco. He reports that he does not drink alcohol and does not use drugs.  No Known Allergies  Family History  Problem Relation Age of Onset   Arthritis Mother    Hypertension Mother    Heart disease Mother        Rheumatic fever   Rheumatic fever Mother    Other Mother        brain tumor   Cancer Mother        leukemia  Hypertension Father    Alcohol abuse Father    Diabetes Son    Colon cancer Neg Hx    Breast cancer Neg Hx    Prostate cancer Neg Hx     Prior to Admission medications   Medication Sig Start Date End Date Taking? Authorizing Provider  albuterol (VENTOLIN HFA) 108 (90 Base) MCG/ACT inhaler Inhale 2 puffs into the lungs every 6 (six) hours as needed. 12/31/21   Saguier, Percell Miller, PA-C  amiodarone (PACERONE) 200 MG tablet Take 1/2 (one-half) tablet by mouth once daily Patient taking differently: Take 100 mg by mouth daily. 10/08/21   Camnitz, Will Hassell Done, MD  amLODipine (NORVASC) 5 MG tablet Take 1 tablet by mouth once daily 01/20/22   Lelon Perla, MD  aspirin EC 81 MG tablet Take 81 mg by mouth daily as needed  for mild pain.    [provider]  atorvastatin (LIPITOR) 40 MG tablet Take 1 tablet (40 mg total) by mouth daily. 10/12/21 10/12/22  Camnitz, Ocie Doyne, MD  dexamethasone (DECADRON) 6 MG tablet Take 1 tablet (6 mg total) by mouth daily. 12/19/21   Danford, Suann Larry, MD  ferrous sulfate 325 (65 FE) MG EC tablet Take 1 tablet (325 mg total) by mouth 2 (two) times daily. Patient taking differently: Take 325 mg by mouth daily. 09/02/15   Irene Shipper, MD  fluticasone [facility]) 50 MCG/ACT nasal spray Place 2 sprays into both nostrils daily. 07/07/21   Mosie Lukes, MD  imatinib ([name]) 400 MG tablet TAKE 1 TABLET (400MG  TOTAL) BY MOUTH DAILY. TAKE WITH A MEAL AND LARGE GLASS OF WATER. Patient taking differently: Take 400 mg by mouth daily. 10/03/21   Volanda Napoleon, MD  levothyroxine (SYNTHROID) 25 MCG tablet Take 1 tablet (25 mcg total) by mouth daily before breakfast. 01/20/22   Mosie Lukes, MD  losartan (COZAAR) 100 MG tablet Take 1 tablet (100 mg total) by mouth daily. 07/27/21   Mosie Lukes, MD  magnesium oxide (MAG-OX) 400 MG tablet Take 1 tablet (400 mg total) by mouth daily. Except Tuesday and Saturday take it BID. Patient taking differently: Take 400 mg by mouth daily. 11/17/21   Mosie Lukes, MD  meclizine (ANTIVERT) 25 MG tablet TAKE 1 TABLET THREE TIMES DAILY Patient taking differently: Take 25 mg by mouth 3 (three) times daily. 09/14/19   Mosie Lukes, MD  metoprolol succinate (TOPROL-XL) 200 MG 24 hr tablet Take 1 tablet (200 mg total) by mouth daily. Take with or immediately following a meal. 02/10/22   Camnitz, Ocie Doyne, MD  nitroGLYCERIN (NITROSTAT) 0.4 MG SL tablet Place 1 tablet (0.4 mg total) under the tongue every 5 (five) minutes as needed for chest pain. 08/18/21   Mosie Lukes, MD  omeprazole (PRILOSEC) 40 MG capsule Take 1 capsule (40 mg total) by mouth daily. 02/09/22   Mosie Lukes, MD  ondansetron (ZOFRAN) 4 MG tablet Take 1 tablet (4 mg  total) by mouth 2 (two) times daily as needed for nausea or vomiting. 07/02/21   Volanda Napoleon, MD  potassium chloride SA (KLOR-CON) 20 MEQ tablet Take 1 tablet (20 mEq total) by mouth 3 (three) times daily. 07/31/21   Charlesetta Shanks, MD  rivaroxaban (XARELTO) 20 MG TABS tablet TAKE 1 TABLET BY MOUTH ONCE DAILY WITH SUPPER Patient taking differently: 20 mg daily. 10/26/21   Camnitz, Ocie Doyne, MD  Saccharomyces boulardii (PROBIOTIC) 250 MG CAPS Take 250 mg by mouth daily. 01/20/21  Mosie Lukes, MD  vitamin B-12 (CYANOCOBALAMIN) 500 MCG tablet Take 500 mcg by mouth daily.    [provider]  Vitamin D, Ergocalciferol, (DRISDOL) 1.25 MG (50000 UNIT) CAPS capsule Take 1 capsule (50,000 Units total) by mouth every 7 (seven) days. Patient not taking: Reported on 02/15/2022 11/17/21   Mosie Lukes, MD    Physical Exam: Vitals:   02/15/22 1300 02/15/22 1330 02/15/22 1345 02/15/22 1447  BP: 127/90 (!) 159/94 (!) 150/78   Pulse: 80 79 94   Resp: 18 16 13    Temp:    (!) 97.5 F (36.4 C)  TempSrc:    Oral  SpO2: 97% 97% 94%   Weight:    75.9 kg  Height:    5\' 8"  (1.727 m)   General:  Appears calm and comfortable and is in NAD, very conversant on 4L [place] O2 without apparent dyspnea Eyes:   EOMI, normal lids, iris ENT:  grossly normal hearing, lips & tongue, mmm; edentulous Neck:  no LAD, masses or thyromegaly Cardiovascular:  RRR, no m/r/g. No LE edema.  Respiratory:   CTA bilaterally with scattered  wheezes, no rales/rhonchi.  Normal respiratory effort and O2 sat on 4L  O2. Abdomen:  soft, NT, ND Skin:  no rash or induration seen on limited exam Musculoskeletal:  grossly normal tone BUE/BLE, good ROM, no bony abnormality Psychiatric:  grossly normal mood and affect, speech fluent and appropriate, AOx3 Neurologic:  CN 2-12 grossly intact, moves all extremities in coordinated fashion   Radiological Exams on Admission: Independently reviewed - see discussion in A/P where  applicable  CT Angio Chest PE W and/or Wo Contrast  Result Date: 02/15/2022 CLINICAL DATA:  76 year old male with history of acute onset of nonlocalized abdominal pain, hypoxia and increased work of breathing. Additional history of chronic myelocytic leukemia (CML). EXAM: CT ANGIOGRAPHY CHEST CT ABDOMEN AND PELVIS WITH CONTRAST TECHNIQUE: Multidetector CT imaging of the chest was performed using the standard protocol during bolus administration of intravenous contrast. Multiplanar CT image reconstructions and MIPs were obtained to evaluate the vascular anatomy. Multidetector CT imaging of the abdomen and pelvis was performed using the standard protocol during bolus administration of intravenous contrast. RADIATION DOSE REDUCTION: This exam was performed according to the departmental dose-optimization program which includes automated exposure control, adjustment of the mA and/or kV according to patient size and/or use of iterative reconstruction technique. CONTRAST:  181mL OMNIPAQUE IOHEXOL 350 MG/ML SOLN COMPARISON:  CT of the chest, abdomen and pelvis 12/15/2021. FINDINGS: CTA CHEST FINDINGS Cardiovascular: There are no filling defects within the pulmonary arterial tree to suggest pulmonary embolism. Heart size is normal. There is no significant pericardial fluid, thickening or pericardial calcification. There is aortic atherosclerosis, as well as atherosclerosis of the great vessels of the mediastinum and the coronary arteries, including calcified atherosclerotic plaque in the left main, left anterior descending, left circumflex and right coronary arteries. Left-sided pacemaker device in place with lead tips terminating in the right atrium and right ventricle. Mediastinum/Nodes: No pathologically enlarged mediastinal or hilar lymph nodes. Hilar esophagus is unremarkable in appearance. No axillary lymphadenopathy. Lungs/Pleura: There are some small nodules scattered throughout the left upper lobe, similar to  the prior study, largest of which (axial image 42 of series 6) measures 9 x 5 mm (mean diameter of 7 mm). No other new suspicious appearing pulmonary nodules or masses are noted. There are widespread areas of ground-glass attenuation and septal thickening in the lungs bilaterally (right greater than left), with some  associated areas of peripheral bronchiolectasis. These findings are unchanged. Mild centrilobular and paraseptal emphysema, most apparent in the lung apices. No confluent consolidative airspace disease. Trace right pleural effusion. No left pleural effusion. Musculoskeletal: There are no aggressive appearing lytic or blastic lesions noted in the visualized portions of the skeleton. Review of the MIP images confirms the above findings. CT ABDOMEN and PELVIS FINDINGS Hepatobiliary: No suspicious cystic or solid hepatic lesions. No intra or extrahepatic biliary ductal dilatation. Status post cholecystectomy. Pancreas: Large coarse calcification in the proximal pancreatic body. No pancreatic mass. No pancreatic ductal dilatation. No peripancreatic fluid collections or inflammatory changes. Spleen: Unremarkable. Adrenals/Urinary Tract: Subcentimeter low-attenuation lesions in both kidneys, too small to characterize, but similar to prior studies and statistically likely to represent tiny cysts. No aggressive appearing renal lesions. No hydroureteronephrosis. Urinary bladder is normal in appearance. Bilateral adrenal glands are normal in appearance. Stomach/Bowel: The appearance of the stomach is normal. No pathologic dilatation of small bowel or colon. Normal appendix. Vascular/Lymphatic: Aortic atherosclerosis with fusiform aneurysmal dilatation of the infrarenal abdominal aorta which measures up to 4.9 x 4.5 cm (axial image 36 of series 2), with large burden of atheromatous plaque and/or mural thrombus in the aneurysmally dilated portion. No lymphadenopathy noted in the abdomen or pelvis. Reproductive:  Prostate gland and seminal vesicles are unremarkable in appearance. Other: Trace volume of ascites.  No pneumoperitoneum. Musculoskeletal: There are no aggressive appearing lytic or blastic lesions noted in the visualized portions of the skeleton. Review of the MIP images confirms the above findings. IMPRESSION: 1. No evidence of pulmonary embolism. 2. No acute findings are noted in the chest, abdomen or pelvis to account for the patient's symptoms. 3. Unusual appearance of the lungs with asymmetrically distributed fibrotic changes (involving the right lung to a greater extent than the left), stable compared to the prior examination, as detailed above. 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 4. Scattered areas of nodularity in the lungs, stable compared to the prior study, favored to be benign, but continued attention on routine follow-up studies is recommended to ensure stability. The largest of these pulmonary nodules in the left upper lobe again demonstrates a mean diameter of 7 mm. 5. Aortic atherosclerosis, in addition to left main and three-vessel coronary artery disease. Fusiform aneurysmal dilatation of the infrarenal abdominal aorta which measures up to 4.9 x 4.5 cm. Recommend follow-up CT/MR every 6 months and vascular consultation. This recommendation follows ACR consensus guidelines: White Paper of the ACR Incidental Findings Committee II on Vascular Findings. J Am Coll Radiol 2013; 10:789-794. 6. Diffuse bronchial wall thickening with mild centrilobular and paraseptal emphysema; imaging findings suggestive of underlying COPD. 7. Additional incidental findings, as above. Electronically Signed   By: Vinnie Langton M.D.   On: 02/15/2022 10:50   CT ABDOMEN PELVIS W CONTRAST  Result Date: 02/15/2022 CLINICAL DATA:  76 year old male with history  of acute onset of nonlocalized abdominal pain, hypoxia and increased work of breathing. Additional history of chronic myelocytic leukemia (CML). EXAM: CT ANGIOGRAPHY CHEST CT ABDOMEN AND PELVIS WITH CONTRAST TECHNIQUE: Multidetector CT imaging of the chest was performed using the standard protocol during bolus administration of intravenous contrast. Multiplanar CT image reconstructions and MIPs were obtained to evaluate the vascular anatomy. Multidetector CT imaging of the abdomen and pelvis was performed using the standard protocol during bolus administration of intravenous contrast. RADIATION DOSE REDUCTION: This exam was  performed according to the departmental dose-optimization program which includes automated exposure control, adjustment of the mA and/or kV according to patient size and/or use of iterative reconstruction technique. CONTRAST:  149mL OMNIPAQUE IOHEXOL 350 MG/ML SOLN COMPARISON:  CT of the chest, abdomen and pelvis 12/15/2021. FINDINGS: CTA CHEST FINDINGS Cardiovascular: There are no filling defects within the pulmonary arterial tree to suggest pulmonary embolism. Heart size is normal. There is no significant pericardial fluid, thickening or pericardial calcification. There is aortic atherosclerosis, as well as atherosclerosis of the great vessels of the mediastinum and the coronary arteries, including calcified atherosclerotic plaque in the left main, left anterior descending, left circumflex and right coronary arteries. Left-sided pacemaker device in place with lead tips terminating in the right atrium and right ventricle. Mediastinum/Nodes: No pathologically enlarged mediastinal or hilar lymph nodes. Hilar esophagus is unremarkable in appearance. No axillary lymphadenopathy. Lungs/Pleura: There are some small nodules scattered throughout the left upper lobe, similar to the prior study, largest of which (axial image 42 of series 6) measures 9 x 5 mm (mean diameter of 7 mm). No other new  suspicious appearing pulmonary nodules or masses are noted. There are widespread areas of ground-glass attenuation and septal thickening in the lungs bilaterally (right greater than left), with some associated areas of peripheral bronchiolectasis. These findings are unchanged. Mild centrilobular and paraseptal emphysema, most apparent in the lung apices. No confluent consolidative airspace disease. Trace right pleural effusion. No left pleural effusion. Musculoskeletal: There are no aggressive appearing lytic or blastic lesions noted in the visualized portions of the skeleton. Review of the MIP images confirms the above findings. CT ABDOMEN and PELVIS FINDINGS Hepatobiliary: No suspicious cystic or solid hepatic lesions. No intra or extrahepatic biliary ductal dilatation. Status post cholecystectomy. Pancreas: Large coarse calcification in the proximal pancreatic body. No pancreatic mass. No pancreatic ductal dilatation. No peripancreatic fluid collections or inflammatory changes. Spleen: Unremarkable. Adrenals/Urinary Tract: Subcentimeter low-attenuation lesions in both kidneys, too small to characterize, but similar to prior studies and statistically likely to represent tiny cysts. No aggressive appearing renal lesions. No hydroureteronephrosis. Urinary bladder is normal in appearance. Bilateral adrenal glands are normal in appearance. Stomach/Bowel: The appearance of the stomach is normal. No pathologic dilatation of small bowel or colon. Normal appendix. Vascular/Lymphatic: Aortic atherosclerosis with fusiform aneurysmal dilatation of the infrarenal abdominal aorta which measures up to 4.9 x 4.5 cm (axial image 36 of series 2), with large burden of atheromatous plaque and/or mural thrombus in the aneurysmally dilated portion. No lymphadenopathy noted in the abdomen or pelvis. Reproductive: Prostate gland and seminal vesicles are unremarkable in appearance. Other: Trace volume of ascites.  No pneumoperitoneum.  Musculoskeletal: There are no aggressive appearing lytic or blastic lesions noted in the visualized portions of the skeleton. Review of the MIP images confirms the above findings. IMPRESSION: 1. No evidence of pulmonary embolism. 2. No acute findings are noted in the chest, abdomen or pelvis to account for the patient's symptoms. 3. Unusual appearance of the lungs with asymmetrically distributed fibrotic changes (involving the right lung to a greater extent than the left), stable compared to the prior examination, as detailed above. 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 4. Scattered areas of nodularity in the lungs, stable compared to the prior study, favored to be benign, but continued attention on routine follow-up studies is recommended to ensure stability. The largest of these pulmonary nodules in the  left upper lobe again demonstrates a mean diameter of 7 mm. 5. Aortic atherosclerosis, in addition to left main and three-vessel coronary artery disease. Fusiform aneurysmal dilatation of the infrarenal abdominal aorta which measures up to 4.9 x 4.5 cm. Recommend follow-up CT/MR every 6 months and vascular consultation. This recommendation follows ACR consensus guidelines: White Paper of the ACR Incidental Findings Committee II on Vascular Findings. J Am Coll Radiol 2013; 10:789-794. 6. Diffuse bronchial wall thickening with mild centrilobular and paraseptal emphysema; imaging findings suggestive of underlying COPD. 7. Additional incidental findings, as above. Electronically Signed   By: Vinnie Langton M.D.   On: 02/15/2022 10:50   DG Chest Portable 1 View  Result Date: 02/15/2022 CLINICAL DATA:  Hypoxia, increased work of breathing EXAM: PORTABLE CHEST 1 VIEW COMPARISON:  Chest radiograph 12/15/2021 FINDINGS: A left chest wall cardiac device is  stable. The cardiomediastinal silhouette is stable. There are increased interstitial markings in both lungs, more so on the right, consistent with fibrotic change as seen on prior CT. There is no definite new superimposed focal airspace disease. There is no pleural effusion or pneumothorax There is no acute osseous abnormality. IMPRESSION: Coarse interstitial markings in both lungs consistent with chronic/fibrotic changes, without definite new superimposed focal airspace disease. Electronically Signed   By: Valetta Mole M.D.   On: 02/15/2022 09:40    EKG: Independently reviewed.  Afib with rate 144; RBBB with NSCSLT   Labs on Admission: I have personally reviewed the available labs and imaging studies at the time of the admission.  Pertinent labs:    VBG: 7.395/35.4/21.7 K+ 3.1 Mag++ 1.4 HS troponin 26, 29 Normal CBC D-dimer 2.03 COVID/flu negative    Assessment and Plan: * COPD with acute exacerbation ([place])- (present on admission) Acute on chronic respiratory failure associated with a COPD exacerbation -Patient's shortness of breath and hypoxia (in 70s on presentation, per EDP) are most likely caused by acute COPD exacerbation.  -He has history of O2-dependent COPD but uses 2L O2 only prn; has been requiring continuous O2 since last night and 4L while at the ER -He does not have fever or leukocytosis.  -Chest x-ray/CTA are not consistent with pneumonia -will admit patient - with his markedly decreased O2 sats (into the 70s), it seems likely that he will need several days of hospitalization to show sufficient improvement for discharge. -Nebulizers: scheduled Duoneb and prn albuterol -Solu-Medrol 60 mg IV BID -> Prednisone 40 mg PO daily -IV Cefepime -He may benefit from addition of controlling medication as an outpatient -Coordinated care with [facility] team/PT/OT/Nutrition/RT consults  Stage 3a chronic kidney disease (CKD) ([place])- (present on admission) -Appears to be stable at this  time -Avoid nephrotoxic agents where possible -Repeat BMP in AM  Pulmonary nodule less than 1 cm in diameter with moderate to high risk for malignant neoplasm -Appears to be stable, also with NSIP and scattered nodularity -Suggest outpatient pulm management  Hypothyroidism- (present on admission) -Continue Synthroid at current dose for now  Coronary artery disease- (present on admission) -L main and 3v disease seen on CTA -Sees Dr. Curt Bears, needs ongoing f/u as an outpatient -Has stents -Needs heart-healthy diet (much to his dismay)  PAF (paroxysmal atrial fibrillation) ([place])- (present on admission) -Rate controlled with Amiodarone and Toprol XL -Toprol dose was recently increased from 100 to 200 due to apparent remote interrogation by Dr. Curt Bears -He was in RVR upon arrival but this resolved with home meds -Continue Xarelto for [facility]  Infrarenal abdominal aortic aneurysm (AAA) without  rupture- (present on admission) -Needs imaging q6 months and outpatient vascular consultation  CML (chronic myelocytic leukemia) ([place])- (present on admission) -Chronic phase but had recurrence when off [name] for 4 months and so resumed -Appears to be stable but due for recheck with Dr. Marin Olp in April (3 months) -Will hold [place] while actively infected     Advance Care Planning:   Code Status: Full Code   Consults: TOC team/PT/OT/Nutrition/RT consults  DVT Prophylaxis: Xarelto  Family Communication: None present; he is capable of communicating with his family at this time  Severity of Illness: The appropriate patient status for this patient is INPATIENT. Inpatient status is judged to be reasonable and necessary in order to provide the required intensity of service to ensure the patient's safety. The patient's presenting symptoms, physical exam findings, and initial radiographic and laboratory data in the context of their chronic comorbidities is felt to place them at high risk for further  clinical deterioration. Furthermore, it is not anticipated that the patient will be medically stable for discharge from the hospital within 2 midnights of admission.   * I certify that at the point of admission it is my clinical judgment that the patient will require inpatient hospital care spanning beyond 2 midnights from the point of admission due to high intensity of service, high risk for further deterioration and high frequency of surveillance required.*  Author: Karmen Bongo, MD 02/15/2022 4:46 PM  For on call review [email or web address].

## 2022-02-15 NOTE — Assessment & Plan Note (Addendum)
Rate controlled with Amiodarone and Toprol XL, Toprol dose was recently increased from 100 to 200 by Dr. Curt Bears.  Continue Xarelto.  Outpatient follow-up with cardiology.

## 2022-02-15 NOTE — Assessment & Plan Note (Addendum)
Continue Gleevac, outpatient follow-up with medical oncology, Dr. Marin Olp.

## 2022-02-15 NOTE — Assessment & Plan Note (Addendum)
Follows with cardiology, [name] and Dr. Curt Bears outpatient. Left heart catheterization with findings 30% pLAD, 90% Ramus, 30% pLCx and 85% mLCx, occluded RPDA; and recommended medical management.   Atorvastatin increased to 80 mg p.o. daily for LDL goal less than 55.  Will need repeat lipid panel and ALT in 6 weeks.  Continue aspirin 81 mg p.o. daily.

## 2022-02-15 NOTE — Assessment & Plan Note (Addendum)
Continue levothyroxine 25 mcg p.o. daily

## 2022-02-15 NOTE — ED Notes (Signed)
POX at 100% with NRB mask, moved to 4lpm via [place] by ED MD

## 2022-02-16 ENCOUNTER — Telehealth: Payer: Self-pay | Admitting: Internal Medicine

## 2022-02-16 ENCOUNTER — Inpatient Hospital Stay (HOSPITAL_COMMUNITY): Payer: Medicare Other

## 2022-02-16 DIAGNOSIS — J441 Chronic obstructive pulmonary disease with (acute) exacerbation: Secondary | ICD-10-CM

## 2022-02-16 DIAGNOSIS — R0609 Other forms of dyspnea: Secondary | ICD-10-CM

## 2022-02-16 LAB — BASIC METABOLIC PANEL
Anion gap: 7 (ref 5–15)
BUN: 9 mg/dL (ref 8–23)
CO2: 24 mmol/L (ref 22–32)
Calcium: 8.6 mg/dL — ABNORMAL LOW (ref 8.9–10.3)
Chloride: 108 mmol/L (ref 98–111)
Creatinine, Ser: 1.12 mg/dL (ref 0.61–1.24)
GFR, Estimated: >60 mL/min (ref >60)
Glucose, Bld: 179 mg/dL — ABNORMAL HIGH (ref 70–99)
Potassium: 4.2 mmol/L (ref 3.5–5.1)
Sodium: 139 mmol/L (ref 135–145)

## 2022-02-16 LAB — ECHOCARDIOGRAM COMPLETE
AR max vel: 4.43 cm2
AV Area VTI: 4.54 cm2
AV Area mean vel: 4.21 cm2
AV Mean grad: 6.5 mmHg
AV Peak grad: 12.5 mmHg
Ao pk vel: 1.77 m/s
Area-P 1/2: 3.99 cm2
Calc EF: 44.9 %
Height: 68 in
P 1/2 time: 721 msec
Single Plane A2C EF: 53.2 %
Single Plane A4C EF: 35.9 %
Weight: 2687.85 oz

## 2022-02-16 LAB — SEDIMENTATION RATE: Sed Rate: 10 mm/hr (ref 0–16)

## 2022-02-16 LAB — CBC
HCT: 37.8 % — ABNORMAL LOW (ref 39.0–52.0)
Hemoglobin: 12.4 g/dL — ABNORMAL LOW (ref 13.0–17.0)
MCH: 30.3 pg (ref 26.0–34.0)
MCHC: 32.8 g/dL (ref 30.0–36.0)
MCV: 92.4 fL (ref 80.0–100.0)
Platelets: 201 10*3/uL (ref 150–400)
RBC: 4.09 MIL/uL — ABNORMAL LOW (ref 4.22–5.81)
RDW: 15.8 % — ABNORMAL HIGH (ref 11.5–15.5)
WBC: 4.1 10*3/uL (ref 4.0–10.5)
nRBC: 0 % (ref 0.0–0.2)

## 2022-02-16 LAB — C-REACTIVE PROTEIN: CRP: 0.7 mg/dL (ref <1.0)

## 2022-02-16 MED ORDER — SODIUM CHLORIDE 0.9 % IV SOLN
2.0000 g | Freq: Two times a day (BID) | INTRAVENOUS | Status: DC
  Administered 2022-02-16: 2 g via INTRAVENOUS
  Filled 2022-02-16: qty 2

## 2022-02-16 MED ORDER — POTASSIUM CHLORIDE CRYS ER 20 MEQ PO TBCR
40.0000 meq | EXTENDED_RELEASE_TABLET | Freq: Once | ORAL | Status: AC
  Administered 2022-02-16: 40 meq via ORAL
  Filled 2022-02-16: qty 2

## 2022-02-16 MED ORDER — IPRATROPIUM-ALBUTEROL 0.5-2.5 (3) MG/3ML IN SOLN: 3.0000 mL | Freq: Four times a day (QID) | RESPIRATORY_TRACT | Status: DC | PRN

## 2022-02-16 MED ORDER — ENSURE ENLIVE PO LIQD
237.0000 mL | Freq: Two times a day (BID) | ORAL | Status: DC
  Administered 2022-02-16 – 2022-02-17 (×3): 237 mL via ORAL

## 2022-02-16 MED ORDER — METHYLPREDNISOLONE SODIUM SUCC 125 MG IJ SOLR: 120.0000 mg | INTRAMUSCULAR | Status: DC

## 2022-02-16 MED ORDER — FUROSEMIDE 10 MG/ML IJ SOLN
40.0000 mg | Freq: Every day | INTRAMUSCULAR | Status: DC
  Administered 2022-02-16 – 2022-02-18 (×3): 40 mg via INTRAVENOUS
  Filled 2022-02-16 (×3): qty 4

## 2022-02-16 MED ORDER — PREDNISONE 20 MG PO TABS
40.0000 mg | ORAL_TABLET | Freq: Every day | ORAL | Status: DC
  Administered 2022-02-17 – 2022-02-19 (×3): 40 mg via ORAL
  Filled 2022-02-16 (×4): qty 2

## 2022-02-16 MED ORDER — MAGNESIUM OXIDE -MG SUPPLEMENT 400 (240 MG) MG PO TABS
400.0000 mg | ORAL_TABLET | ORAL | Status: DC
  Administered 2022-02-17 – 2022-02-19 (×2): 400 mg via ORAL
  Filled 2022-02-16 (×2): qty 1

## 2022-02-16 MED ORDER — ADULT MULTIVITAMIN W/MINERALS CH
1.0000 | ORAL_TABLET | Freq: Every day | ORAL | Status: DC
Start: 2022-02-16 — End: 2022-02-19
  Administered 2022-02-16 – 2022-02-19 (×4): 1 via ORAL
  Filled 2022-02-16 (×4): qty 1

## 2022-02-16 MED ORDER — IPRATROPIUM-ALBUTEROL 0.5-2.5 (3) MG/3ML IN SOLN: 3.0000 mL | Freq: Four times a day (QID) | RESPIRATORY_TRACT | Status: DC

## 2022-02-16 MED ORDER — METHYLPREDNISOLONE SODIUM SUCC 125 MG IJ SOLR
120.0000 mg | INTRAMUSCULAR | Status: DC
Start: 2022-02-16 — End: 2022-02-16
  Filled 2022-02-16: qty 2

## 2022-02-16 NOTE — Evaluation (Signed)
Occupational Therapy Evaluation Patient Details Name: Jason [name] MRN: [id number] DOB: July 18, 1946 Today's Date: 02/16/2022   History of Present Illness 76 y.o. male presenting to [facility] on 2/20 for acute respiratory distress, profound dyspnea walking to the bathroom with O2 stats in the 70s upon presentation. Currently on 3-4L NCO2. PMH significant of CML (2012); CAD; CVA; HTN; HLD; COPD on home O2; and afib on Xarelto with pacemaker, and previous diagnosis of COVID in Decemeber (d/c 12/24).   Clinical Impression   PT admitted with respiratory distress. Pt currently with functional limitiations due to the deficits listed below (see OT problem list). Pt noted to have decreased oxygen to 83% on 5L [place]. OT educating on access oxygen saturation levels with activity to decrease fall risk. Pt is able to to complete LB dressing sit<>stand during session Supervision.  Pt will benefit from skilled OT to increase their independence and safety with adls and balance to allow discharge [place].  *pt reports "I dont really see the need in therapy when my grandkids do it all. I am pretty well setup"       Recommendations for follow up therapy are one component of a multi-disciplinary discharge planning process, led by the attending physician.  Recommendations may be updated based on patient status, additional functional criteria and insurance authorization.   Follow Up Recommendations  Home health OT    Assistance Recommended at Discharge Set up Supervision/Assistance  Patient can return home with the following A little help with bathing/dressing/bathroom;Assistance with cooking/housework    Functional Status Assessment  Patient has had a recent decline in their functional status and demonstrates the ability to make significant improvements in function in a reasonable and predictable amount of time.  Equipment Recommendations  Other (comment) (oxygen)    Recommendations for Other Services        Precautions / Restrictions Precautions Precautions: Other (comment) Precaution Comments: 5L 02 [place]      Mobility Bed Mobility Overal bed mobility: Independent                  Transfers Overall transfer level: Independent                        Balance                                           ADL either performed or assessed with clinical judgement   ADL Overall ADL's : Needs assistance/impaired Eating/Feeding: Independent   Grooming: Wash/dry hands;Supervision/safety;Standing               Lower Body Dressing: Supervision/safety;Sit to/from stand   Toilet Transfer: Supervision/safety;Ambulation;Regular Toilet   Toileting- Water quality scientist and Hygiene: Supervision/safety;Sit to/from stand       Functional mobility during ADLs: Supervision/safety General ADL Comments: pt with decreased oxygen to 83% on 5L and pt appears shocked to learn of the low level with spot checks. pt educated on the need to pace and use energy conservation. pt states not feeling home health is needed because family does everything     Vision Baseline Vision/History: 1 Wears glasses Additional Comments: reading only     Perception     Praxis      Pertinent Vitals/Pain Pain Assessment Pain Assessment: No/denies pain     Hand Dominance Right   Extremity/Trunk Assessment Upper Extremity Assessment Upper Extremity Assessment: Overall [facility]  for tasks assessed   Lower Extremity Assessment Lower Extremity Assessment: Overall WFL for tasks assessed   Cervical / Trunk Assessment Cervical / Trunk Assessment: Kyphotic   Communication Communication Communication: No difficulties   Cognition Arousal/Alertness: Awake/alert Behavior During Therapy: WFL for tasks assessed/performed Overall Cognitive Status: Within Functional Limits for tasks assessed                                       General Comments  5L [place] with decrease to  83% during session with adls.    Exercises     Shoulder Instructions      Home Living Family/patient expects to be discharged to:: Private residence Living Arrangements: Other relatives Available Help at Discharge: Family;Available 24 hours/day Type of Home: House Home Access: Stairs to enter [employer] of Steps: 2 Entrance Stairs-Rails: None Home Layout: One level     Bathroom Shower/Tub: Occupational psychologist: Standard Bathroom Accessibility: Yes   Home Equipment: None   Additional Comments: Pt reports grandaughter works but grandson is home most of the time and very helpful. reports having two oxygen concentration machines in the home      Prior Functioning/Environment Prior Level of Function : Independent/Modified Independent               ADLs Comments: reports family does all the house hold task and "i feel useless" because everything is set up for me        OT Problem List: Decreased activity tolerance;Impaired balance (sitting and/or standing);Cardiopulmonary status limiting activity      OT Treatment/Interventions: Self-care/ADL training;Energy conservation;DME and/or AE instruction;Therapeutic activities;Patient/family education;Balance training    OT Goals(Current goals can be found in the care plan section) Acute Rehab OT Goals Patient Stated Goal: to be able to mow my yard . its a riding mower OT Goal Formulation: With patient Time For Goal Achievement: 03/02/22 Potential to Achieve Goals: Good  OT Frequency: Min 2X/week    Co-evaluation              AM-PAC OT "6 Clicks" Daily Activity     Outcome Measure Help from another person eating meals?: None Help from another person taking care of personal grooming?: None Help from another person toileting, which includes using toliet, bedpan, or urinal?: A Little Help from another person bathing (including washing, rinsing, drying)?: A Little Help from another person to  put on and taking off regular upper body clothing?: None Help from another person to put on and taking off regular lower body clothing?: A Little 6 Click Score: 21   End of Session Equipment Utilized During Treatment: Oxygen Nurse Communication: Mobility status;Precautions  Activity Tolerance: Patient tolerated treatment well Patient left: in bed;with call bell/phone within reach  OT Visit Diagnosis: Unsteadiness on feet (R26.81);Muscle weakness (generalized) (M62.81)                Time: 9629-5284 OT Time Calculation (min): 18 min Charges:  OT General Charges $OT Visit: 1 Visit OT Evaluation $OT Eval Moderate Complexity: 1 Mod   Brynn, OTR/L  Acute Rehabilitation Services Pager: [phone] Office: [phone] .   Jeri Modena 02/16/2022, 3:04 PM

## 2022-02-16 NOTE — Progress Notes (Signed)
Initial Nutrition Assessment  DOCUMENTATION CODES:   Non-severe (moderate) malnutrition in context of chronic illness  INTERVENTION:   Multivitamin w/ minerals daily Ensure Enlive po BID, each supplement provides 350 kcal and 20 grams of protein. Recommend liberalizing pt diet to regular due to malnutrition. Received ok from MD. Encourage good PO intake  NUTRITION DIAGNOSIS:   Moderate Malnutrition related to chronic illness (COPD) as evidenced by mild fat depletion, mild muscle depletion.  GOAL:   Patient will meet greater than or equal to 90% of their needs  MONITOR:   PO intake, Supplement acceptance, Weight trends, Labs  REASON FOR ASSESSMENT:   Consult Other (Comment) (Nutritional Goals)  ASSESSMENT:   76 y.o. male presented to the [facility] ED with shortness of breath. PMH includes COPD, GERD, HTN, CVA, CKD IIIa, and IBS. Pt admitted with COPD exacerbation.   Pt reports that prior to his admission in December at [facility], he did not have an appetite. Reports that he was having to force himself to eat. States that there was just nothing that he wanted, added that his grandchildren would try to get food that he wanted. Pt reports that while at [facility] his appetite returned and for the most part it has still been good. Pt reports that he really enjoyed his breakfast this morning. Pt reports that his favorite drink is Pepsi and that he knows that he needs to try and drink more water.   Discussed ONS, pt agreeable. Pt reports that he thinks he may be discharged in the morning.   Pt reports that he use to weight 195# and then lost down to 153# when he wasn't eating well. Unable to determine weight loss due to chronic illness and unable to determine fluid versus dry weight loss.   Medications reviewed and include: Colace, Lasix, Magnesium Oxide, Solu-Medrol, Protonix, IV antibiotics Labs reviewed: Magnesium 1.4    NUTRITION - FOCUSED PHYSICAL EXAM:  Flowsheet Row Most Recent  Value  Orbital Region Mild depletion  Upper Arm Region Mild depletion  Thoracic and Lumbar Region No depletion  Buccal Region No depletion  Temple Region Mild depletion  Clavicle Bone Region Mild depletion  Clavicle and Acromion Bone Region Mild depletion  Scapular Bone Region Mild depletion  Dorsal Hand No depletion  Patellar Region Mild depletion  Anterior Thigh Region Mild depletion  Posterior Calf Region Mild depletion  Edema (RD Assessment) None  Hair Reviewed  Eyes Reviewed  Mouth Reviewed  Skin Reviewed  Nails Reviewed    Diet Order:   Diet Order             Diet Heart Room service appropriate? Yes; Fluid consistency: Thin  Diet effective now                   EDUCATION NEEDS:   No education needs have been identified at this time  Skin:  Skin Assessment: Reviewed RN Assessment  Last BM:  2/20  Height:   Ht Readings from Last 1 Encounters:  02/15/22 5\' 8"  (1.727 m)    Weight:   Wt Readings from Last 1 Encounters:  02/16/22 76.2 kg    Ideal Body Weight:  70 kg  BMI:  Body mass index is 25.54 kg/m.  Estimated Nutritional Needs:   Kcal:  2200-2400  Protein:  110-125 grams  Fluid:  >/= 2.2 L    Hermina Barters RD, LDN Clinical Dietitian See [facility] for contact information.

## 2022-02-16 NOTE — Consult Note (Signed)
NAME:  Jason [name], MRN:  [id number], DOB:  [birth year], LOS: 1 ADMISSION DATE:  02/15/2022, CONSULTATION DATE:  02/16/22 REFERRING MD:  Broadus John, CHIEF COMPLAINT:  SOB   History of Present Illness:  76 year old man with hx of smoking, CLL on gleevec, COPD diagnosis without PFTs, Afib on amio/xarelto presenting with acute on chronic SOB. - States breathing trouble began back in Dec when he was diagnosed with COVID - Since has never really recovered, MMRC 1-2.  Has 2LPM O2 at home which he uses PRN. - No aspiration symptoms, no unusual work or home exposures.  PCCM consulted as his CT is abnormal.  Saturating low 90s on 4LPM.  Pertinent  Medical History  CVA BML CAD GERD HLD HTN PAF, SSS, PPM, on [facility]  Significant Hospital Events: Including procedures, antibiotic start and stop dates in addition to other pertinent events   2/20 admitted  Interim History / Subjective:  Consulted  Objective   Blood pressure 131/76, pulse 75, temperature 97.9 F (36.6 C), temperature source Oral, resp. rate 20, height 5\' 8"  (1.727 m), weight 76.2 kg, SpO2 93 %.        Intake/Output Summary (Last 24 hours) at 02/16/2022 1148 Last data filed at 02/16/2022 0300 Gross per 24 hour  Intake 1184.47 ml  Output 625 ml  Net 559.47 ml   Filed Weights   02/15/22 0909 02/15/22 1447 02/16/22 0513  Weight: 94 kg 75.9 kg 76.2 kg    Examination: General: elderly man in NAD HENT: MMM, trachea midline Lungs: Crackles at bases R>L, no accessory muscle use Cardiovascular: RRR, ext warm Abdomen: Soft, +BS Extremities: no edema, +clubbing Neuro: Moves all 4 ext to command Psych: Aox3, [facility] Problem list   N/a  Assessment & Plan:  Acute on chronic hypoxemia: review of prior imaging shows changes dating back to 2017.  Has probable UIP pattern (basilar, peripheral interstitial fibrosis) only strange pattern is its asymmetric nature R>L.  Has the history for IPF.  Noted ongoing  exposure to gleevec and amiodarone but ILD pattern not c/w this so would not change these meds at this time.  Pct neg and no infectious symptoms so can remove that.  Will start ILD workup and have close pulm office f/u.  - SLP eval, would appreciate MBSS if they have time to rule out chronic silent aspiration - Steroids to pred x 5 days as ordered - Rheum labs as ordered - Will need to go home on 4LPM - Will set up with one of our ILD specialists to review workup to date and get PFTs, consideration for antifibrotics.  Best Practice (right click and "Reselect all SmartList Selections" daily)  Per primary  Labs   CBC: Recent Labs  Lab 02/15/22 0917 02/15/22 0925 02/16/22 0102  WBC 9.4  --  4.1  NEUTROABS 7.5  --   --   HGB 14.8 15.6 12.4*  HCT 44.6 46.0 37.8*  MCV 92.5  --  92.4  PLT 253  --  235    Basic Metabolic Panel: Recent Labs  Lab 02/15/22 0917 02/15/22 0925 02/16/22 0102  NA 139 144 139  K 3.1* 3.1* 4.2  CL 109  --  108  CO2 20*  --  24  GLUCOSE 101*  --  179*  BUN 7*  --  9  CREATININE 1.12  --  1.12  CALCIUM 8.6*  --  8.6*  MG 1.4*  --   --    GFR: Estimated Creatinine  Clearance: 56.8 mL/min (by C-G formula based on SCr of 1.12 mg/dL). Recent Labs  Lab 02/15/22 0917 02/15/22 1119 02/16/22 0102  PROCALCITON  --  <0.10  --   WBC 9.4  --  4.1    Liver Function Tests: Recent Labs  Lab 02/15/22 0917  AST 21  ALT 11  ALKPHOS 73  BILITOT 1.1  PROT 6.9  ALBUMIN 3.5   Recent Labs  Lab 02/15/22 0917  LIPASE 67*   No results for input(s): AMMONIA in the last 168 hours.  ABG    Component Value Date/Time   HCO3 21.7 02/15/2022 0925   TCO2 23 02/15/2022 0925   ACIDBASEDEF 3.0 (H) 02/15/2022 0925   O2SAT 45 02/15/2022 0925     Coagulation Profile: No results for input(s): INR, PROTIME in the last 168 hours.  Cardiac Enzymes: No results for input(s): CKTOTAL, CKMB, CKMBINDEX, TROPONINI in the last 168 hours.  HbA1C: No results found for:  HGBA1C  CBG: No results for input(s): GLUCAP in the last 168 hours.  Review of Systems:    Positive Symptoms in bold:  Constitutional fevers, chills, weight loss, fatigue, anorexia, malaise  Eyes decreased vision, double vision, eye irritation  Ears, Nose, Mouth, Throat sore throat, trouble swallowing, sinus congestion  Cardiovascular chest pain, paroxysmal nocturnal dyspnea, lower ext edema, palpitations   Respiratory SOB, cough, DOE, hemoptysis, wheezing  Gastrointestinal nausea, vomiting, diarrhea  Genitourinary burning with urination, trouble urinating  Musculoskeletal joint aches, joint swelling, back pain  Integumentary  rashes, skin lesions  Neurological focal weakness, focal numbness, trouble speaking, headaches  Psychiatric depression, anxiety, confusion  Endocrine polyuria, polydipsia, cold intolerance, heat intolerance  Hematologic abnormal bruising, abnormal bleeding, unexplained nose bleeds  Allergic/Immunologic recurrent infections, hives, swollen lymph nodes     Past Medical History:  He,  has a past medical history of Anemia (05/08/2015), CML (chronic myelocytic leukemia) ([place]) (12/16/2011), Coronary heart disease, CVA (cerebrovascular accident) ([place]), GERD (gastroesophageal reflux disease), Grief reaction (03/18/2014), H/O tobacco use, presenting hazards to health (02/24/2012), History of chicken pox, History of kidney stones, Hyperlipidemia, Hypertension, IBS (irritable bowel syndrome) (09/20/2013), Medicare annual wellness visit, subsequent (03/23/2014), PAF (paroxysmal atrial fibrillation) ([place]), and Symptomatic bradycardia.   Surgical History:   Past Surgical History:  Procedure Laterality Date   CATARACT EXTRACTION     CHOLECYSTECTOMY  2003   CORONARY ANGIOPLASTY WITH STENT PLACEMENT  2000   EP IMPLANTABLE DEVICE N/A 04/15/2016   Procedure: Pacemaker Implant;  Surgeon: Evans Lance, MD; Medtronic (serial number [id number]) pacemaker; Laterality: Left      Social History:   reports that he quit smoking about 5 years ago. His smoking use included cigarettes. He started smoking about 54 years ago. He has a 21.00 pack-year smoking history. He has never used smokeless tobacco. He reports that he does not drink alcohol and does not use drugs.   Family History:  His family history includes Alcohol abuse in his father; Arthritis in his mother; Cancer in his mother; Diabetes in his son; Heart disease in his mother; Hypertension in his father and mother; Other in his mother; Rheumatic fever in his mother. There is no history of Colon cancer, Breast cancer, or Prostate cancer.   Allergies No Known Allergies   Home Medications  Prior to Admission medications   Medication Sig Start Date End Date Taking? Authorizing Provider  albuterol (VENTOLIN HFA) 108 (90 Base) MCG/ACT inhaler Inhale 2 puffs into the lungs every 6 (six) hours as needed. Patient taking differently: Inhale 2 puffs  into the lungs every 6 (six) hours as needed for shortness of breath. 12/31/21  Yes Saguier, Percell Miller, PA-C  amiodarone (PACERONE) 200 MG tablet Take 1/2 (one-half) tablet by mouth once daily Patient taking differently: Take 100 mg by mouth daily. 10/08/21  Yes Camnitz, Will Hassell Done, MD  amLODipine (NORVASC) 5 MG tablet Take 1 tablet by mouth once daily 01/20/22  Yes Crenshaw, Denice Bors, MD  atorvastatin (LIPITOR) 40 MG tablet Take 1 tablet (40 mg total) by mouth daily. 10/12/21 10/12/22 Yes Camnitz, Will Hassell Done, MD  ferrous sulfate 325 (65 FE) MG EC tablet Take 1 tablet (325 mg total) by mouth 2 (two) times daily. Patient taking differently: Take 325 mg by mouth daily. 09/02/15  Yes Irene Shipper, MD  fluticasone [facility]) 50 MCG/ACT nasal spray Place 2 sprays into both nostrils daily. 07/07/21  Yes Mosie Lukes, MD  imatinib ([name]) 400 MG tablet TAKE 1 TABLET (400MG  TOTAL) BY MOUTH DAILY. TAKE WITH A MEAL AND LARGE GLASS OF WATER. Patient taking differently: Take 400 mg by mouth  daily. 10/03/21  Yes Volanda Napoleon, MD  levothyroxine (SYNTHROID) 25 MCG tablet Take 1 tablet (25 mcg total) by mouth daily before breakfast. 01/20/22  Yes Mosie Lukes, MD  losartan (COZAAR) 100 MG tablet Take 1 tablet (100 mg total) by mouth daily. 07/27/21  Yes Mosie Lukes, MD  magnesium oxide (MAG-OX) 400 MG tablet Take 1 tablet (400 mg total) by mouth daily. Except Tuesday and Saturday take it BID. Patient taking differently: Take 400-800 mg by mouth See admin instructions. Take one tablet by mouth every day except on Tuesdays and Saturdays take two tablets by mouth per patient 11/17/21  Yes Mosie Lukes, MD  meclizine (ANTIVERT) 25 MG tablet TAKE 1 TABLET THREE TIMES DAILY Patient taking differently: Take 25 mg by mouth 3 (three) times daily as needed for dizziness. 09/14/19  Yes Mosie Lukes, MD  metoprolol succinate (TOPROL-XL) 200 MG 24 hr tablet Take 1 tablet (200 mg total) by mouth daily. Take with or immediately following a meal. 02/10/22  Yes Camnitz, Ocie Doyne, MD  nitroGLYCERIN (NITROSTAT) 0.4 MG SL tablet Place 1 tablet (0.4 mg total) under the tongue every 5 (five) minutes as needed for chest pain. 08/18/21  Yes Mosie Lukes, MD  ondansetron (ZOFRAN) 4 MG tablet Take 1 tablet (4 mg total) by mouth 2 (two) times daily as needed for nausea or vomiting. 07/02/21  Yes Ennever, Rudell Cobb, MD  rivaroxaban (XARELTO) 20 MG TABS tablet TAKE 1 TABLET BY MOUTH ONCE DAILY WITH SUPPER Patient taking differently: 20 mg daily. 10/26/21  Yes Camnitz, Will Hassell Done, MD  Saccharomyces boulardii (PROBIOTIC) 250 MG CAPS Take 250 mg by mouth daily. 01/20/21  Yes Mosie Lukes, MD  vitamin B-12 (CYANOCOBALAMIN) 500 MCG tablet Take 500 mcg by mouth daily.   Yes [provider]  omeprazole (PRILOSEC) 40 MG capsule Take 1 capsule (40 mg total) by mouth daily. 02/09/22   Mosie Lukes, MD

## 2022-02-16 NOTE — Progress Notes (Signed)
SLP Note  Patient Details Name: Jason [name] MRN: [id number] DOB: [birth year]   Orders received for swallow assessment. Dr.Smith requested MBS to r/o potential aspiration.  Will schedule for next date given current schedule for today is full.  Tiffane Sheldon L. Tivis Ringer, [place] Office number [phone] Pager [phone]     Assunta Curtis 02/16/2022, 2:37 PM

## 2022-02-16 NOTE — Progress Notes (Addendum)
PROGRESS NOTE    OTHNIEL MARET  OVZ:[id number] DOB: 1946-02-15 DOA: 02/15/2022 PCP: Mosie Lukes, MD  Narrative 76/M, long-term smoker, quit in 2017, COPD, history of CVA, CAD, hypertension, dyslipidemia, paroxysmal A-fib on Xarelto, PPM, was diagnosed with COVID in December'22-discharged home on oxygen-2L then, reports progressively improving, and then worsening over the last 3 to 4 days, noticed increased dyspnea on exertion also noticed some mild swelling in his feet. -In the ED he was hypoxic-sats 70s, placed on nonrebreather mask and then 5 L O2, labs notable for hypokalemia, hypomagnesemia, elevated D-dimer, CTA chest-? findings of pulmonary fibrosis, NSIP -Started on broad-spectrum antibiotics and steroids  Subjective: -Breathing improving  Assessment and Plan:  * COPD with acute exacerbation (HCC)-  Acute on chronic respiratory failure  -A component of COPD exacerbation suspected, in addition also has some finding of fibrosis,?  NSIP, had COVID back in December, also on amiodarone -Clinically do not suspect infectious process, was started on cefepime yesterday, check procalcitonin -Influenza and COVID PCR negative -Continue IV steroids today, DuoNebs -Check ESR, has scant edema too, Lasix x1, will check echo -Will request pulmonary input -Wean O2 as tolerated, increase activity  Stage 3a chronic kidney disease (CKD) ([place])- (present on admission) -Creatinine stable  Pulmonary nodule less than 1 cm in diameter with moderate to high risk for malignant neoplasm -Appears to be stable, also with NSIP and scattered nodularity -Pulmonary input requested  Hypothyroidism- (present on admission) -Continue Synthroid at current dose for now  Coronary artery disease- (present on admission) -L main and 3v disease seen on CTA -Sees Dr. Curt Bears, needs ongoing f/u as an outpatient -Has stents -Stable  PAF (paroxysmal atrial fibrillation) ([place])- (present on admission) -Rate  controlled with Amiodarone and Toprol XL, continue Xarelto -Toprol dose was recently increased from 100 to 200 due to apparent remote interrogation by Dr. Curt Bears  Infrarenal abdominal aortic aneurysm (AAA) without rupture- (present on admission) -Needs imaging q6 months and outpatient vascular consultation  CML (chronic myelocytic leukemia) ([place])- (present on admission) -Chronic phase but had recurrence when off [place] for 4 months and so resumed -Appears to be stable but due for recheck with Dr. Marin Olp in April (3 months) -[name] held on admission due to concern for infection, will resume at discharge  Hypokalemia -Replaced  Hypomagnesemia -Will replace and recheck  DVT prophylaxis: Xarelto Code Status: Full code Family Communication: Discussed patient in detail, no family at bedside Disposition Plan: Home in 48 hours pending clinical improvement  Consultants:  Pulm  Procedures:   Antimicrobials:    Objective: Vitals:   02/15/22 2335 02/16/22 0513 02/16/22 0815 02/16/22 0953  BP: 122/76 131/76    Pulse: 70 70  75  Resp: 19 20    Temp: 97.6 F (36.4 C) 98.3 F (36.8 C)  97.9 F (36.6 C)  TempSrc: Oral Oral  Oral  SpO2: 90% 90% 93%   Weight:  76.2 kg    Height:        Intake/Output Summary (Last 24 hours) at 02/16/2022 1054 Last data filed at 02/16/2022 0300 Gross per 24 hour  Intake 1184.47 ml  Output 625 ml  Net 559.47 ml   Filed Weights   02/15/22 0909 02/15/22 1447 02/16/22 0513  Weight: 94 kg 75.9 kg 76.2 kg    Examination:  General exam: Appears calm and comfortable  Respiratory system: poor air movement, no wheezing Cardiovascular system: S1 & S2 heard, RRR.  Abd: nondistended, soft and nontender.Normal bowel sounds heard. Central nervous system: Alert and  oriented. No focal neurological deficits. Extremities: trace edema Skin: No rashes Psychiatry: Judgement and insight appear normal. Mood & affect appropriate.     Data Reviewed:    CBC: Recent Labs  Lab 02/15/22 0917 02/15/22 0925 02/16/22 0102  WBC 9.4  --  4.1  NEUTROABS 7.5  --   --   HGB 14.8 15.6 12.4*  HCT 44.6 46.0 37.8*  MCV 92.5  --  92.4  PLT 253  --  840   Basic Metabolic Panel: Recent Labs  Lab 02/15/22 0917 02/15/22 0925 02/16/22 0102  NA 139 144 139  K 3.1* 3.1* 4.2  CL 109  --  108  CO2 20*  --  24  GLUCOSE 101*  --  179*  BUN 7*  --  9  CREATININE 1.12  --  1.12  CALCIUM 8.6*  --  8.6*  MG 1.4*  --   --    GFR: Estimated Creatinine Clearance: 56.8 mL/min (by C-G formula based on SCr of 1.12 mg/dL). Liver Function Tests: Recent Labs  Lab 02/15/22 0917  AST 21  ALT 11  ALKPHOS 73  BILITOT 1.1  PROT 6.9  ALBUMIN 3.5   Recent Labs  Lab 02/15/22 0917  LIPASE 67*   No results for input(s): AMMONIA in the last 168 hours. Coagulation Profile: No results for input(s): INR, PROTIME in the last 168 hours. Cardiac Enzymes: No results for input(s): CKTOTAL, CKMB, CKMBINDEX, TROPONINI in the last 168 hours. BNP (last 3 results) No results for input(s): PROBNP in the last 8760 hours. HbA1C: No results for input(s): HGBA1C in the last 72 hours. CBG: No results for input(s): GLUCAP in the last 168 hours. Lipid Profile: No results for input(s): CHOL, HDL, LDLCALC, TRIG, CHOLHDL, LDLDIRECT in the last 72 hours. Thyroid Function Tests: No results for input(s): TSH, T4TOTAL, FREET4, T3FREE, THYROIDAB in the last 72 hours. Anemia Panel: No results for input(s): VITAMINB12, FOLATE, FERRITIN, TIBC, IRON, RETICCTPCT in the last 72 hours. Urine analysis:    Component Value Date/Time   COLORURINE YELLOW 07/31/2021 1845   APPEARANCEUR CLEAR 07/31/2021 1845   LABSPEC <1.005 (L) 07/31/2021 1845   PHURINE 6.5 07/31/2021 1845   GLUCOSEU NEGATIVE 07/31/2021 1845   HGBUR NEGATIVE 07/31/2021 1845   BILIRUBINUR NEGATIVE 07/31/2021 1845   KETONESUR NEGATIVE 07/31/2021 1845   PROTEINUR NEGATIVE 07/31/2021 1845   UROBILINOGEN 0.2  06/27/2014 1315   NITRITE NEGATIVE 07/31/2021 1845   LEUKOCYTESUR NEGATIVE 07/31/2021 1845   Sepsis Labs: $[email or web address]'[id number]$ (procalcitonin:4,lacticidven:4)  ) Recent Results (from the past 240 hour(s))  Resp Panel by RT-PCR (Flu A&B, Covid) Nasopharyngeal Swab     Status: None   Collection Time: 02/15/22  9:45 AM   Specimen: Nasopharyngeal Swab; Nasopharyngeal(NP) swabs in vial transport medium  Result Value Ref Range Status   SARS Coronavirus 2 by RT PCR NEGATIVE NEGATIVE Final    Comment: (NOTE) SARS-CoV-2 target nucleic acids are NOT DETECTED.  The SARS-CoV-2 RNA is generally detectable in upper respiratory specimens during the acute phase of infection. The lowest concentration of SARS-CoV-2 viral copies this assay can detect is 138 copies/mL. A negative result does not preclude SARS-Cov-2 infection and should not be used as the sole basis for treatment or other patient management decisions. A negative result may occur with  improper specimen collection/handling, submission of specimen other than nasopharyngeal swab, presence of viral mutation(s) within the areas targeted by this assay, and inadequate number of viral copies(<138 copies/mL). A negative result must be combined with clinical observations, patient history,  and epidemiological information. The expected result is Negative.  Fact Sheet for Patients:  EntrepreneurPulse.com.au  Fact Sheet for Healthcare Providers:  IncredibleEmployment.be  This test is no t yet approved or cleared by the [place] FDA and  has been authorized for detection and/or diagnosis of SARS-CoV-2 by FDA under an Emergency Use Authorization (EUA). This EUA will remain  in effect (meaning this test can be used) for the duration of the COVID-19 declaration under Section 564(b)(1) of the Act, 21 U.S.C.section 360bbb-3(b)(1), unless the authorization is terminated  or revoked sooner.       Influenza A by PCR  NEGATIVE NEGATIVE Final   Influenza B by PCR NEGATIVE NEGATIVE Final    Comment: (NOTE) The Xpert Xpress SARS-CoV-2/FLU/RSV plus assay is intended as an aid in the diagnosis of influenza from Nasopharyngeal swab specimens and should not be used as a sole basis for treatment. Nasal washings and aspirates are unacceptable for Xpert Xpress SARS-CoV-2/FLU/RSV testing.  Fact Sheet for Patients: EntrepreneurPulse.com.au  Fact Sheet for Healthcare Providers: IncredibleEmployment.be  This test is not yet approved or cleared by the [place] FDA and has been authorized for detection and/or diagnosis of SARS-CoV-2 by FDA under an Emergency Use Authorization (EUA). This EUA will remain in effect (meaning this test can be used) for the duration of the COVID-19 declaration under Section 564(b)(1) of the Act, 21 U.S.C. section 360bbb-3(b)(1), unless the authorization is terminated or revoked.  Performed at [facility], [address], [place], [place] [address]      Radiology Studies: CT Angio Chest PE W and/or Wo Contrast  Result Date: 02/15/2022 CLINICAL DATA:  76 year old male with history of acute onset of nonlocalized abdominal pain, hypoxia and increased work of breathing. Additional history of chronic myelocytic leukemia (CML). EXAM: CT ANGIOGRAPHY CHEST CT ABDOMEN AND PELVIS WITH CONTRAST TECHNIQUE: Multidetector CT imaging of the chest was performed using the standard protocol during bolus administration of intravenous contrast. Multiplanar CT image reconstructions and MIPs were obtained to evaluate the vascular anatomy. Multidetector CT imaging of the abdomen and pelvis was performed using the standard protocol during bolus administration of intravenous contrast. RADIATION DOSE REDUCTION: This exam was performed according to the departmental dose-optimization program which includes automated exposure control, adjustment of the mA and/or kV  according to patient size and/or use of iterative reconstruction technique. CONTRAST:  137mL OMNIPAQUE IOHEXOL 350 MG/ML SOLN COMPARISON:  CT of the chest, abdomen and pelvis 12/15/2021. FINDINGS: CTA CHEST FINDINGS Cardiovascular: There are no filling defects within the pulmonary arterial tree to suggest pulmonary embolism. Heart size is normal. There is no significant pericardial fluid, thickening or pericardial calcification. There is aortic atherosclerosis, as well as atherosclerosis of the great vessels of the mediastinum and the coronary arteries, including calcified atherosclerotic plaque in the left main, left anterior descending, left circumflex and right coronary arteries. Left-sided pacemaker device in place with lead tips terminating in the right atrium and right ventricle. Mediastinum/Nodes: No pathologically enlarged mediastinal or hilar lymph nodes. Hilar esophagus is unremarkable in appearance. No axillary lymphadenopathy. Lungs/Pleura: There are some small nodules scattered throughout the left upper lobe, similar to the prior study, largest of which (axial image 42 of series 6) measures 9 x 5 mm (mean diameter of 7 mm). No other new suspicious appearing pulmonary nodules or masses are noted. There are widespread areas of ground-glass attenuation and septal thickening in the lungs bilaterally (right greater than left), with some associated areas of peripheral bronchiolectasis. These findings are  unchanged. Mild centrilobular and paraseptal emphysema, most apparent in the lung apices. No confluent consolidative airspace disease. Trace right pleural effusion. No left pleural effusion. Musculoskeletal: There are no aggressive appearing lytic or blastic lesions noted in the visualized portions of the skeleton. Review of the MIP images confirms the above findings. CT ABDOMEN and PELVIS FINDINGS Hepatobiliary: No suspicious cystic or solid hepatic lesions. No intra or extrahepatic biliary ductal  dilatation. Status post cholecystectomy. Pancreas: Large coarse calcification in the proximal pancreatic body. No pancreatic mass. No pancreatic ductal dilatation. No peripancreatic fluid collections or inflammatory changes. Spleen: Unremarkable. Adrenals/Urinary Tract: Subcentimeter low-attenuation lesions in both kidneys, too small to characterize, but similar to prior studies and statistically likely to represent tiny cysts. No aggressive appearing renal lesions. No hydroureteronephrosis. Urinary bladder is normal in appearance. Bilateral adrenal glands are normal in appearance. Stomach/Bowel: The appearance of the stomach is normal. No pathologic dilatation of small bowel or colon. Normal appendix. Vascular/Lymphatic: Aortic atherosclerosis with fusiform aneurysmal dilatation of the infrarenal abdominal aorta which measures up to 4.9 x 4.5 cm (axial image 36 of series 2), with large burden of atheromatous plaque and/or mural thrombus in the aneurysmally dilated portion. No lymphadenopathy noted in the abdomen or pelvis. Reproductive: Prostate gland and seminal vesicles are unremarkable in appearance. Other: Trace volume of ascites.  No pneumoperitoneum. Musculoskeletal: There are no aggressive appearing lytic or blastic lesions noted in the visualized portions of the skeleton. Review of the MIP images confirms the above findings. IMPRESSION: 1. No evidence of pulmonary embolism. 2. No acute findings are noted in the chest, abdomen or pelvis to account for the patient's symptoms. 3. Unusual appearance of the lungs with asymmetrically distributed fibrotic changes (involving the right lung to a greater extent than the left), stable compared to the prior examination, as detailed above. 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 4. Scattered areas of nodularity in  the lungs, stable compared to the prior study, favored to be benign, but continued attention on routine follow-up studies is recommended to ensure stability. The largest of these pulmonary nodules in the left upper lobe again demonstrates a mean diameter of 7 mm. 5. Aortic atherosclerosis, in addition to left main and three-vessel coronary artery disease. Fusiform aneurysmal dilatation of the infrarenal abdominal aorta which measures up to 4.9 x 4.5 cm. Recommend follow-up CT/MR every 6 months and vascular consultation. This recommendation follows ACR consensus guidelines: White Paper of the ACR Incidental Findings Committee II on Vascular Findings. J Am Coll Radiol 2013; 10:789-794. 6. Diffuse bronchial wall thickening with mild centrilobular and paraseptal emphysema; imaging findings suggestive of underlying COPD. 7. Additional incidental findings, as above. Electronically Signed   By: Vinnie Langton M.D.   On: 02/15/2022 10:50   CT ABDOMEN PELVIS W CONTRAST  Result Date: 02/15/2022 CLINICAL DATA:  76 year old male with history of acute onset of nonlocalized abdominal pain, hypoxia and increased work of breathing. Additional history of chronic myelocytic leukemia (CML). EXAM: CT ANGIOGRAPHY CHEST CT ABDOMEN AND PELVIS WITH CONTRAST TECHNIQUE: Multidetector CT imaging of the chest was performed using the standard protocol during bolus administration of intravenous contrast. Multiplanar CT image reconstructions and MIPs were obtained to evaluate the vascular anatomy. Multidetector CT imaging of the abdomen and pelvis was performed using the standard protocol during bolus administration of intravenous contrast. RADIATION DOSE REDUCTION: This exam was performed according to the departmental dose-optimization program which  includes automated exposure control, adjustment of the mA and/or kV according to patient size and/or use of iterative reconstruction technique. CONTRAST:  124mL OMNIPAQUE IOHEXOL 350 MG/ML SOLN  COMPARISON:  CT of the chest, abdomen and pelvis 12/15/2021. FINDINGS: CTA CHEST FINDINGS Cardiovascular: There are no filling defects within the pulmonary arterial tree to suggest pulmonary embolism. Heart size is normal. There is no significant pericardial fluid, thickening or pericardial calcification. There is aortic atherosclerosis, as well as atherosclerosis of the great vessels of the mediastinum and the coronary arteries, including calcified atherosclerotic plaque in the left main, left anterior descending, left circumflex and right coronary arteries. Left-sided pacemaker device in place with lead tips terminating in the right atrium and right ventricle. Mediastinum/Nodes: No pathologically enlarged mediastinal or hilar lymph nodes. Hilar esophagus is unremarkable in appearance. No axillary lymphadenopathy. Lungs/Pleura: There are some small nodules scattered throughout the left upper lobe, similar to the prior study, largest of which (axial image 42 of series 6) measures 9 x 5 mm (mean diameter of 7 mm). No other new suspicious appearing pulmonary nodules or masses are noted. There are widespread areas of ground-glass attenuation and septal thickening in the lungs bilaterally (right greater than left), with some associated areas of peripheral bronchiolectasis. These findings are unchanged. Mild centrilobular and paraseptal emphysema, most apparent in the lung apices. No confluent consolidative airspace disease. Trace right pleural effusion. No left pleural effusion. Musculoskeletal: There are no aggressive appearing lytic or blastic lesions noted in the visualized portions of the skeleton. Review of the MIP images confirms the above findings. CT ABDOMEN and PELVIS FINDINGS Hepatobiliary: No suspicious cystic or solid hepatic lesions. No intra or extrahepatic biliary ductal dilatation. Status post cholecystectomy. Pancreas: Large coarse calcification in the proximal pancreatic body. No pancreatic mass. No  pancreatic ductal dilatation. No peripancreatic fluid collections or inflammatory changes. Spleen: Unremarkable. Adrenals/Urinary Tract: Subcentimeter low-attenuation lesions in both kidneys, too small to characterize, but similar to prior studies and statistically likely to represent tiny cysts. No aggressive appearing renal lesions. No hydroureteronephrosis. Urinary bladder is normal in appearance. Bilateral adrenal glands are normal in appearance. Stomach/Bowel: The appearance of the stomach is normal. No pathologic dilatation of small bowel or colon. Normal appendix. Vascular/Lymphatic: Aortic atherosclerosis with fusiform aneurysmal dilatation of the infrarenal abdominal aorta which measures up to 4.9 x 4.5 cm (axial image 36 of series 2), with large burden of atheromatous plaque and/or mural thrombus in the aneurysmally dilated portion. No lymphadenopathy noted in the abdomen or pelvis. Reproductive: Prostate gland and seminal vesicles are unremarkable in appearance. Other: Trace volume of ascites.  No pneumoperitoneum. Musculoskeletal: There are no aggressive appearing lytic or blastic lesions noted in the visualized portions of the skeleton. Review of the MIP images confirms the above findings. IMPRESSION: 1. No evidence of pulmonary embolism. 2. No acute findings are noted in the chest, abdomen or pelvis to account for the patient's symptoms. 3. Unusual appearance of the lungs with asymmetrically distributed fibrotic changes (involving the right lung to a greater extent than the left), stable compared to the prior examination, as detailed above. 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 4. Scattered areas of nodularity in the lungs, stable compared to the prior study, favored to be benign, but continued attention on routine follow-up studies is  recommended to ensure stability. The largest of these pulmonary nodules in the left upper lobe again demonstrates a mean  diameter of 7 mm. 5. Aortic atherosclerosis, in addition to left main and three-vessel coronary artery disease. Fusiform aneurysmal dilatation of the infrarenal abdominal aorta which measures up to 4.9 x 4.5 cm. Recommend follow-up CT/MR every 6 months and vascular consultation. This recommendation follows ACR consensus guidelines: White Paper of the ACR Incidental Findings Committee II on Vascular Findings. J Am Coll Radiol 2013; 10:789-794. 6. Diffuse bronchial wall thickening with mild centrilobular and paraseptal emphysema; imaging findings suggestive of underlying COPD. 7. Additional incidental findings, as above. Electronically Signed   By: Vinnie Langton M.D.   On: 02/15/2022 10:50   DG Chest Portable 1 View  Result Date: 02/15/2022 CLINICAL DATA:  Hypoxia, increased work of breathing EXAM: PORTABLE CHEST 1 VIEW COMPARISON:  Chest radiograph 12/15/2021 FINDINGS: A left chest wall cardiac device is stable. The cardiomediastinal silhouette is stable. There are increased interstitial markings in both lungs, more so on the right, consistent with fibrotic change as seen on prior CT. There is no definite new superimposed focal airspace disease. There is no pleural effusion or pneumothorax There is no acute osseous abnormality. IMPRESSION: Coarse interstitial markings in both lungs consistent with chronic/fibrotic changes, without definite new superimposed focal airspace disease. Electronically Signed   By: Valetta Mole M.D.   On: 02/15/2022 09:40     Scheduled Meds:  amiodarone  100 mg Oral Daily   amLODipine  5 mg Oral Daily   atorvastatin  40 mg Oral Daily   docusate sodium  100 mg Oral BID   fluticasone  2 spray Each Nare Daily   furosemide  40 mg Intravenous Daily   levothyroxine  25 mcg Oral QAC breakfast   losartan  100 mg Oral Daily   magnesium oxide  400 mg Oral 2 times  per day on Tue Sat   [START ON 02/17/2022] magnesium oxide  400 mg Oral Once per day on Sun Mon Wed Thu Fri   methylPREDNISolone (SOLU-MEDROL) injection  120 mg Intravenous Q24H   metoprolol  200 mg Oral Daily   pantoprazole  40 mg Oral Daily   rivaroxaban  20 mg Oral Q supper   sodium chloride flush  3 mL Intravenous Q12H   Continuous Infusions:  ceFEPime (MAXIPIME) IV 2 g (02/16/22 1006)     LOS: 1 day    Time spent: 55min    Domenic Polite, MD [facility]   02/16/2022, 10:54 AM

## 2022-02-16 NOTE — TOC Progression Note (Signed)
Transition of Care [facility]) - Progression Note    Patient Details  Name: Jason [name] MRN: [id number] Date of Birth: [birth year]  [facility]) CM/SW Contact  Zenon Mayo, RN Phone Number: 02/16/2022, 4:42 PM  Clinical Narrative:    He is set up with [facility] for [facility], [place], [place], will need orders.     Expected Discharge Plan: [place] Barriers to Discharge: Continued Medical Work up  Expected Discharge Plan and Services Expected Discharge Plan: [place]   Discharge Planning Services: CM Consult Post Acute Care Choice: [place] arrangements for the past 2 months: Single Family Home                   DME Agency: NA       HH Arranged: RN, PT, OT, Disease Management [place] Agency: [place] Date [facility] Agency Contacted: 02/16/22 Time [place]: [id number] Representative spoke with at [place]: [place] ([place]) Interventions    Readmission Risk Interventions Readmission Risk Prevention Plan 02/16/2022  Transportation Screening Complete  PCP or Specialist Appt within 3-5 Days Complete  HRI or [place] Complete  Social Work Consult for [place] Planning/Counseling Complete  Palliative Care Screening Not Applicable  Medication Review Press photographer) Complete  Some recent data might be hidden

## 2022-02-16 NOTE — TOC Initial Note (Addendum)
Transition of Care [facility]) - Initial/Assessment Note    Patient Details  Name: Jason [name] MRN: [id number] Date of Birth: 1946/08/20  Transition of Care [facility]) CM/SW Contact:    Zenon Mayo, RN Phone Number: 02/16/2022, 2:20 PM  Clinical Narrative:                 NCM spoke with patient, he states he lives with his grand daughter and grand son, he has a scale and a bp cuff, he does not check his bp that often, and he eats everything that he wants does not know if is has lots of sodium.  He takes xarelto at home pta , he has home oxygen with [employer]  2 liters. Conts on iv solumedrol, iv lasix,  for MBA to r/o aspiration.  NCM offered choice for HHRN, [place], [place].  Patient states he will look over the Medicare.gov list.  NCM left the list with him.  Patient called NCM  said he does not have a preference , NCM made referral to [facility] with [place].  Awaiting to hear back.  Per Stacie with [facility] , she can not take referral. NCM made referral to [facility] with [facility] , he is able to take referral.  Soc will begin 24 to 48 hrs post dc.   Expected Discharge Plan: [place] Barriers to Discharge: Continued Medical Work up   Patient Goals and CMS Choice Patient states their goals for this hospitalization and ongoing recovery are:: return home CMS Medicare.gov Compare Post Acute Care list provided to:: Patient Choice offered to / list presented to : Patient  Expected Discharge Plan and Services Expected Discharge Plan: [place]   Discharge Planning Services: CM Consult Post Acute Care Choice: [place] arrangements for the past 2 months: Single Family Home                   DME Agency: NA       HH Arranged: PT          Prior Living Arrangements/Services Living arrangements for the past 2 months: Single Family Home Lives with:: Adult Children Patient language and need for interpreter reviewed:: Yes Do you feel safe going back  to the place where you live?: Yes      Need for Family Participation in Patient Care: Yes (Comment) Care giver support system in place?: Yes (comment) Current home services: DME (home oxygen 2 liters with [employer]) Criminal Activity/Legal Involvement Pertinent to Current Situation/Hospitalization: No - Comment as needed  Activities of Daily Living Home Assistive Devices/Equipment: Oxygen ADL Screening (condition at time of admission) Patient's cognitive ability adequate to safely complete daily activities?: Yes Is the patient deaf or have difficulty hearing?: No Does the patient have difficulty seeing, even when wearing glasses/contacts?: No Does the patient have difficulty concentrating, remembering, or making decisions?: No Patient able to express need for assistance with ADLs?: Yes Does the patient have difficulty dressing or bathing?: No Independently performs ADLs?: Yes (appropriate for developmental age) Does the patient have difficulty walking or climbing stairs?: No Weakness of Legs: None Weakness of Arms/Hands: None  Permission Sought/Granted                  Emotional Assessment Appearance:: Appears stated age Attitude/Demeanor/Rapport: Engaged Affect (typically observed): Appropriate Orientation: : Oriented to  Time, Oriented to Situation, Oriented to Place, Oriented to Self Alcohol / Substance Use: Not Applicable Psych Involvement: No (comment)  Admission diagnosis:  Hypokalemia [E87.6] Pulmonary nodules [R91.8] COPD exacerbation (HCC) [J44.1] NSTEMI (non-ST elevated myocardial infarction) ([place]) [I21.4] Acute respiratory failure with hypoxia ([place]) [J96.01] COPD with acute exacerbation (HCC) [J44.1] Atrial fibrillation with RVR ([place]) [I48.91] Abdominal aortic aneurysm (AAA) without rupture, unspecified part [I71.40] Patient Active Problem List   Diagnosis Date Noted   COPD with acute exacerbation ([place]) 02/15/2022   Elevated troponin 02/15/2022   Stage 3a  chronic kidney disease (CKD) ([place]) 12/16/2021   Demand ischemia ([place]) 12/16/2021   Severe sepsis ([place]) 12/15/2021   COVID-19 virus infection 12/15/2021   Acute hypoxemic respiratory failure ([place]) 12/15/2021   Pulmonary nodule less than 1 cm in diameter with moderate to high risk for malignant neoplasm 08/19/2021   Chronic obstructive pulmonary disease ([place]) 08/19/2021   Abdominal pain 08/19/2021   Elevated lipase 08/18/2021   Hypomagnesemia 08/18/2021   Right knee pain 03/24/2021   Fall 03/24/2021   Hypothyroidism 09/11/2020   Vitamin B12 deficiency 03/09/2020   Renal insufficiency 03/06/2020   Headache 03/26/2019   Symptomatic bradycardia    PAF (paroxysmal atrial fibrillation) (HCC)    History of kidney stones    Hypertension    History of chicken pox    GERD (gastroesophageal reflux disease)    CVA (cerebrovascular accident) ([place])    Sun-damaged skin 09/18/2017   Otitis externa 09/18/2017   Tachy-brady syndrome ([place]) 04/16/2016   Anemia 05/08/2015   Infrarenal abdominal aortic aneurysm (AAA) without rupture 03/19/2015   Dizziness 10/13/2014   Bruit 08/07/2014   Chest pain with moderate risk of acute coronary syndrome 07/15/2014   Chronic anticoagulation 07/15/2014   History of CVA (cerebrovascular accident) 07/15/2014   Preventative health care 03/23/2014   Medicare annual wellness visit, subsequent 03/23/2014   Grief reaction 03/18/2014   IBS (irritable bowel syndrome) 09/20/2013   Hypokalemia 09/09/2012   Corneal edema 09/08/2012   Pseudophakia 09/08/2012   Atrial fibrillation with RVR ([place]) 09/01/2012   Coronary artery disease 08/31/2012   Nausea 07/06/2012   Mature cataract 06/23/2012   Hyperlipidemia, mixed 02/24/2012   CAD- prior stents 2000 by Dr Elonda Husky [facility] 02/24/2012   H/O tobacco use, presenting hazards to health 02/24/2012   CML (chronic myelocytic leukemia) ([place]) 12/16/2011   PCP:  Mosie Lukes, MD Pharmacy:   [place], [address] [place] [address] Phone: [phone] Fax: [phone]     Social Determinants of Health (SDOH) Interventions    Readmission Risk Interventions Readmission Risk Prevention Plan 02/16/2022  Transportation Screening Complete  PCP or Specialist Appt within 3-5 Days Complete  HRI or Home Care Consult Complete  Social Work Consult for [place] Planning/Counseling Complete  Palliative Care Screening Not Applicable  Medication Review Press photographer) Complete  Some recent data might be hidden

## 2022-02-16 NOTE — Telephone Encounter (Signed)
2-3 week Appt with APP or MR or PM to establish care for likely IPF.  Will need PFTs ordered and potentially pulmonary rehab referral.

## 2022-02-16 NOTE — Evaluation (Signed)
Physical Therapy Evaluation Patient Details Name: Jason [name] MRN: [id number] DOB: 1946-02-13 Today's Date: 02/16/2022  History of Present Illness  76 y.o. male presenting to [facility] on 2/20 for acute respiratory distress, profound dyspnea walking to the bathroom with O2 stats in the 70s upon presentation. Currently on 3-4L NCO2. PMH significant of CML (2012); CAD; CVA; HTN; HLD; COPD on home O2; and afib on Xarelto with pacemaker, and previous diagnosis of COVID in Decemeber (d/c 12/24).  Clinical Impression  Pt presents with decreased activity tolerance and endurance. These impairments are limiting his ability to safely and independently transfer, get into his home, perform all adls/iadls, and ambulate in the community. Pt to benefit from acute PT to address deficits. Functional transfers and gait performed and pt ambulated 300 feet with no AD.  Pt. Responded well but was limited secondary to O2 saturation, minimum saturation 82%, recovered well with standing break, pursed lip breathing, and increased O2 to 6L. Extensive education on proper breathing techniques and how to used pursed lip breathing to maintain saturation performed. SPT recommends home health follow up once medically stable for d/c. PT to progress mobility as tolerated, and will continue to follow acutely.         Recommendations for follow up therapy are one component of a multi-disciplinary discharge planning process, led by the attending physician.  Recommendations may be updated based on patient status, additional functional criteria and insurance authorization.  Follow Up Recommendations Home health PT    Assistance Recommended at Discharge PRN  Patient can return home with the following  A little help with walking and/or transfers;A little help with bathing/dressing/bathroom;Assistance with cooking/housework;Assist for transportation    Equipment Recommendations None recommended by PT  Recommendations for Other Services   Other (comment) (Mobility Consult)    Functional Status Assessment Patient has had a recent decline in their functional status and demonstrates the ability to make significant improvements in function in a reasonable and predictable amount of time.     Precautions / Restrictions Precautions Precautions: Other (comment) Precaution Comments: Needs 02 4L stationary, 6L with movement Restrictions Weight Bearing Restrictions: No      Mobility  Bed Mobility Overal bed mobility: Needs Assistance Bed Mobility: Supine to Sit     Supine to sit: Min guard     General bed mobility comments: Min guard for saftey and line management    Transfers Overall transfer level: Needs assistance Equipment used: None Transfers: Sit to/from Stand Sit to Stand: Min guard           General transfer comment: For saftey and line management    Ambulation/Gait Ambulation/Gait assistance: Min guard Gait Distance (Feet): 300 Feet Assistive device: None Gait Pattern/deviations: WFL(Within Functional Limits) Gait velocity: decreased        Stairs            Wheelchair Mobility    Modified Rankin (Stroke Patients Only)       Balance Overall balance assessment: No apparent balance deficits (not formally assessed)                                           Pertinent Vitals/Pain Pain Assessment Pain Assessment: No/denies pain    Home Living Family/patient expects to be discharged to:: Private residence Living Arrangements: Other relatives Available Help at Discharge: Family;Available 24 hours/day Type of Home: House Home Access: Stairs to enter Entrance  Stairs-Rails: None Entrance Stairs-Number of Steps: 2   Home Layout: One level Home Equipment: None Additional Comments: Pt reports grandaughter works but grandson is home most of the time and very helpful    Prior Function Prior Level of Function : Independent/Modified Independent              Mobility Comments: Reports mild dyspnea and would use 2L PRN       Hand Dominance   Dominant Hand: Right    Extremity/Trunk Assessment   Upper Extremity Assessment Upper Extremity Assessment: Overall WFL for tasks assessed    Lower Extremity Assessment Lower Extremity Assessment: Overall WFL for tasks assessed    Cervical / Trunk Assessment Cervical / Trunk Assessment: Kyphotic  Communication   Communication: No difficulties  Cognition Arousal/Alertness: Awake/alert Behavior During Therapy: WFL for tasks assessed/performed Overall Cognitive Status: Within Functional Limits for tasks assessed                                 General Comments: Pt. requires cuing for nasal inhale and persued lip exhale instead of communicating during d-sats        General Comments      Exercises     Assessment/Plan    PT Assessment Patient needs continued PT services  PT Problem List Decreased strength;Decreased mobility;Decreased safety awareness;Decreased knowledge of precautions;Decreased activity tolerance;Cardiopulmonary status limiting activity       PT Treatment Interventions DME instruction;Therapeutic exercise;Gait training;Balance training;Stair training;Neuromuscular re-education;Functional mobility training;Therapeutic activities;Patient/family education    PT Goals (Current goals can be found in the Care Plan section)  Acute Rehab PT Goals Patient Stated Goal: Improve acitivty tolerance and O2 stability PT Goal Formulation: With patient Time For Goal Achievement: 03/02/22 Potential to Achieve Goals: Good Additional Goals Additional Goal #1: Pt. will be able to demonstrate proper pursed lip breathing techniques to help maintain O2 saturation.    Frequency Min 3X/week     Co-evaluation               AM-PAC PT "6 Clicks" Mobility  Outcome Measure Help needed turning from your back to your side while in a flat bed without using bedrails?:  None Help needed moving from lying on your back to sitting on the side of a flat bed without using bedrails?: None   Help needed standing up from a chair using your arms (e.g., wheelchair or bedside chair)?: A Little Help needed to walk in hospital room?: A Little Help needed climbing 3-5 steps with a railing? : A Little 6 Click Score: 17    End of Session Equipment Utilized During Treatment: Oxygen Activity Tolerance: Patient limited by fatigue;Treatment limited secondary to medical complications (Comment) (02 saturation) Patient left: in chair;with call bell/phone within reach Nurse Communication: Mobility status PT Visit Diagnosis: Muscle weakness (generalized) (M62.81);Other (comment) (Low activity tolerance secondary to COPD)    Time: 2707-8675 PT Time Calculation (min) (ACUTE ONLY): 26 min   Charges:   PT Evaluation $PT Eval Low Complexity: 1 Low PT Treatments $Self Care/Home Management: 8-22        Thermon Leyland, SPT Acute Rehab Services   Thermon Leyland 02/16/2022, 11:08 AM

## 2022-02-17 ENCOUNTER — Inpatient Hospital Stay (HOSPITAL_COMMUNITY): Payer: Medicare Other

## 2022-02-17 ENCOUNTER — Other Ambulatory Visit (HOSPITAL_COMMUNITY): Payer: Self-pay

## 2022-02-17 DIAGNOSIS — I5023 Acute on chronic systolic (congestive) heart failure: Secondary | ICD-10-CM

## 2022-02-17 DIAGNOSIS — E440 Moderate protein-calorie malnutrition: Secondary | ICD-10-CM | POA: Diagnosis present

## 2022-02-17 DIAGNOSIS — R778 Other specified abnormalities of plasma proteins: Secondary | ICD-10-CM

## 2022-02-17 DIAGNOSIS — J9621 Acute and chronic respiratory failure with hypoxia: Secondary | ICD-10-CM

## 2022-02-17 DIAGNOSIS — I4891 Unspecified atrial fibrillation: Secondary | ICD-10-CM

## 2022-02-17 DIAGNOSIS — I7143 Infrarenal abdominal aortic aneurysm, without rupture: Secondary | ICD-10-CM

## 2022-02-17 DIAGNOSIS — I2510 Atherosclerotic heart disease of native coronary artery without angina pectoris: Secondary | ICD-10-CM

## 2022-02-17 DIAGNOSIS — I714 Abdominal aortic aneurysm, without rupture, unspecified: Secondary | ICD-10-CM

## 2022-02-17 LAB — CBC
HCT: 36.9 % — ABNORMAL LOW (ref 39.0–52.0)
Hemoglobin: 12.1 g/dL — ABNORMAL LOW (ref 13.0–17.0)
MCH: 30.7 pg (ref 26.0–34.0)
MCHC: 32.8 g/dL (ref 30.0–36.0)
MCV: 93.7 fL (ref 80.0–100.0)
Platelets: 215 10*3/uL (ref 150–400)
RBC: 3.94 MIL/uL — ABNORMAL LOW (ref 4.22–5.81)
RDW: 16 % — ABNORMAL HIGH (ref 11.5–15.5)
WBC: 15.4 10*3/uL — ABNORMAL HIGH (ref 4.0–10.5)
nRBC: 0 % (ref 0.0–0.2)

## 2022-02-17 LAB — BASIC METABOLIC PANEL
Anion gap: 10 (ref 5–15)
BUN: 18 mg/dL (ref 8–23)
CO2: 22 mmol/L (ref 22–32)
Calcium: 9.2 mg/dL (ref 8.9–10.3)
Chloride: 105 mmol/L (ref 98–111)
Creatinine, Ser: 1.06 mg/dL (ref 0.61–1.24)
GFR, Estimated: >60 mL/min (ref >60)
Glucose, Bld: 104 mg/dL — ABNORMAL HIGH (ref 70–99)
Potassium: 4.6 mmol/L (ref 3.5–5.1)
Sodium: 137 mmol/L (ref 135–145)

## 2022-02-17 LAB — ANA W/REFLEX IF POSITIVE: Anti Nuclear Antibody (ANA): NEGATIVE

## 2022-02-17 LAB — RHEUMATOID FACTOR: Rheumatoid fact SerPl-aCnc: 16.6 IU/mL — ABNORMAL HIGH (ref <14.0)

## 2022-02-17 MED ORDER — SODIUM CHLORIDE 0.9% FLUSH
3.0000 mL | Freq: Two times a day (BID) | INTRAVENOUS | Status: DC
  Administered 2022-02-18: 3 mL via INTRAVENOUS

## 2022-02-17 MED ORDER — SODIUM CHLORIDE 0.9 % IV SOLN: 250.0000 mL | INTRAVENOUS | Status: DC | PRN

## 2022-02-17 MED ORDER — SODIUM CHLORIDE 0.9 % IV SOLN: INTRAVENOUS | Status: DC

## 2022-02-17 MED ORDER — SODIUM CHLORIDE 0.9% FLUSH: 3.0000 mL | INTRAVENOUS | Status: DC | PRN

## 2022-02-17 MED ORDER — ASPIRIN EC 81 MG PO TBEC
81.0000 mg | DELAYED_RELEASE_TABLET | Freq: Every day | ORAL | Status: DC
  Administered 2022-02-17 – 2022-02-19 (×3): 81 mg via ORAL
  Filled 2022-02-17 (×3): qty 1

## 2022-02-17 MED ORDER — SACUBITRIL-VALSARTAN 24-26 MG PO TABS: 1.0000 | ORAL_TABLET | Freq: Two times a day (BID) | ORAL | Status: DC

## 2022-02-17 MED ORDER — SPIRONOLACTONE 25 MG PO TABS
25.0000 mg | ORAL_TABLET | Freq: Every day | ORAL | Status: DC
  Administered 2022-02-17 – 2022-02-18 (×2): 25 mg via ORAL
  Filled 2022-02-17 (×2): qty 1

## 2022-02-17 MED ORDER — SACUBITRIL-VALSARTAN 49-51 MG PO TABS: 1.0000 | ORAL_TABLET | Freq: Two times a day (BID) | ORAL | Status: DC

## 2022-02-17 MED ORDER — HEPARIN (PORCINE) 25000 UT/250ML-% IV SOLN
1200.0000 [IU]/h | INTRAVENOUS | Status: DC
  Administered 2022-02-17: 1050 [IU]/h via INTRAVENOUS
  Administered 2022-02-18: 1200 [IU]/h via INTRAVENOUS
  Filled 2022-02-17 (×2): qty 250

## 2022-02-17 NOTE — Progress Notes (Signed)
Modified Barium Swallow Progress Note  Patient Details  Name: Jason [name] MRN: [id number] Date of Birth: [birth year]  Today's Date: 02/17/2022  Modified Barium Swallow completed.  Full report located under Chart Review in the Imaging Section.  Brief recommendations include the following:  Clinical Impression  Patient was seen for a MBS d/t concerns of chronic silent aspiration. Oral mech exam WFL with adequate labial and lingual ROM and strength observed. MBS revealed that there are no clinical indications of aspiration, penetration or oropharyngeal dysphagia. Mastication, lateralization, and bolus preparation/propulsion WFL across all consistencies/textures. Epiglottic inversion and laryngeal vestibule closure present. Noted some residue at the level of the pyriform sinuses; pt able to clear residue independently. Neither penetration or aspiration was observed in MBS. Noted that pill with thin was effortful to clear at esophageal level; resolved when followed with sips of thin liquids. SLP recommends patient continue regular/thin diet; Patient educated on taking medications with cup of water to ensure they are passing effectively.   Swallow Evaluation Recommendations       SLP Diet Recommendations: Regular solids;Thin liquid   Liquid Administration via: Cup;Straw   Medication Administration: Whole meds with liquid   Supervision: Patient able to self feed       Postural Changes: Remain semi-upright after after feeds/meals (Comment);Seated upright at 90 degrees   Oral Care Recommendations: Oral care BID        Vaughan Sine 02/17/2022,1:11 PM

## 2022-02-17 NOTE — TOC Benefit Eligibility Note (Signed)
Patient Teacher, English as a foreign language completed.    The patient is currently admitted and upon discharge could be taking Entresto 24-26 mg.  The current 30 day co-pay is, $0.00.   The patient is currently admitted and upon discharge could be taking Jardiance 10 mg.  The current 30 day co-pay is, $0.00.   The patient is currently admitted and upon discharge could be taking Farxiga 10 mg.  The current 30 day co-pay is, $0.00.   The patient is insured through [place] Patient Advocate Specialist [place] Patient Advocate Team Direct Number: [phone]  Fax: [phone]

## 2022-02-17 NOTE — Assessment & Plan Note (Addendum)
Patient with history of CAD, s/p PCI/stents greater than 10 years ago.  Echo 2/21 noted drop in EF from 45-50% down to 30% with severe left ventricular wall motion abnormalities including inferolateral and inferior wall.  Cardiology was consulted and followed during hospital course.  Started on IV diuresis underwent left heart catheterization with findings 30% pLAD, 90% Ramus, 30% pLCx and 85% mLCx, occluded RPDA; and recommended medical management.  Holding ARB/ACE inhibitor on discharge due to bump in creatinine.  Continue atorvastatin 80 mg p.o. daily, Toprol XL 200 mg p.o. daily, Jardiance 10 mg p.o. daily.  Follow-up [facility] clinic 1 week and Dr. Curt Bears 6 weeks.

## 2022-02-17 NOTE — Progress Notes (Signed)
Mobility Specialist Progress Note:   02/17/22 1115  Mobility  Activity Ambulated with assistance in hallway  Level of Assistance Standby assist, set-up cues, supervision of patient - no hands on  Assistive Device None  Distance Ambulated (ft) 450 ft  Activity Response Tolerated well  $Mobility charge 1 Mobility   Pt received ambulating to BR independently on 4LO2. Agreeable to mobility session. Pt asx during ambulation, back sitting EOB with all needs met.   Nelta Numbers Acute Rehab Phone: [phone] Office Phone: [phone]

## 2022-02-17 NOTE — Assessment & Plan Note (Addendum)
Patient presenting with progressive shortness of breath, etiology likely multifactorial with COPD exacerbation combined with decompensated systolic/diastolic congestive heart failure.  CT angiogram chest negative for pulmonary embolism but findings concerning for pulmonary fibrosis/NSIP.  Longstanding history of tobacco abuse, quit 2017 with recent COVID-19 infection and amiodarone use.  Seen by speech therapy, no concerns for aspiration.  Completed 5-day course of antibiotics/steroids for COPD and diuresis with IV Lasix as below.  Patient's oxygen needs were titrated down but will require 3 L nasal cannula consistently at time of discharge.

## 2022-02-17 NOTE — Progress Notes (Signed)
PROGRESS NOTE    Jason [name]  HQI:[id number] DOB: August 31, 1946 DOA: 02/15/2022 PCP: Mosie Lukes, MD  Brief Narrative:76/M, long-term smoker, quit in 2017, COPD, history of CVA, CAD, hypertension, dyslipidemia, paroxysmal A-fib on Xarelto, PPM, was diagnosed with COVID in December'22-discharged home on oxygen-2L then, reports progressively improving, and then worsening over the last 3 to 4 days, noticed increased dyspnea on exertion also noticed some mild swelling in his feet. -In the ED he was hypoxic-sats 70s, placed on nonrebreather mask and then 5 L O2, labs notable for hypokalemia, hypomagnesemia, elevated D-dimer, CTA chest-? findings of pulmonary fibrosis, NSIP -Started on broad-spectrum antibiotics and steroids -Also noted to have edema on exam, echo with drop in EF to 30%, left ventricular inferior WMA   Subjective: -Breathing better, no events overnight, scant cough   Assessment and Plan: Acute on chronic respiratory failure with hypoxia ([place]) Pulmonary fibrosis suspected -Likely NSIP based on imaging, long history of smoking, quit in 2017, recent COVID, and amiodarone use -SLP eval to rule out silent aspiration -IV steroids discontinued, transitioned to prednisone taper, switch to p.o. antibiotics to complete 5-day course -Component of CHF 2, repeat Lasix, see discussion below  Acute on chronic systolic CHF (congestive heart failure) (HCC) - On exam noted to have lower extremity edema, given Lasix x1 yesterday, will repeat today -Echo yesterday notes drop in EF from 45-50% down to 30% with severe left ventricular wall motion abnormalities including inferolateral and inferior wall -Known history of CAD, recalls having stents, > 10 years ago at [facility] -Will request cardiology eval today -Add Aldactone, continue IV Lasix today, continue losartan, transition to Entresto soon  Stage 3a chronic kidney disease (CKD) ([place])- (present on admission) - Creatinine stable, continue to  monitor  PAF (paroxysmal atrial fibrillation) ([place])- (present on admission) Rate controlled with Amiodarone and Toprol XL, continue Xarelto -Toprol dose was recently increased from 100 to 200 by Dr. Curt Bears  Coronary artery disease- (present on admission) -L main and 3v disease seen on CTA -Followed by [name], [facility] -Cardiology consulted, see discussion above  Pulmonary nodule less than 1 cm in diameter with moderate to high risk for malignant neoplasm -Appears to be stable, also with NSIP and scattered nodularity -Suggest outpatient pulm management  Hypothyroidism- (present on admission) -Continue Synthroid at current dose for now  Infrarenal abdominal aortic aneurysm (AAA) without rupture- (present on admission) -Needs imaging q6 months and outpatient vascular consultation  CML (chronic myelocytic leukemia) ([place])- (present on admission) -Chronic phase but had recurrence when off [place] for 4 months and so resumed -Appears to be stable but due for recheck with Dr. Marin Olp in April (3 months) -[name] on hold now, resume at discharge   DVT prophylaxis: Xarelto Code Status: Full code Family Communication: Discussed with patient in detail, no family at bedside Disposition Plan: Home pending cardiology eval  Consultants:  Pulmonary, cardiology  Procedures:   Antimicrobials:    Objective: Vitals:   02/16/22 1330 02/16/22 1954 02/17/22 0411 02/17/22 0728  BP: 132/74 122/75 138/76 (!) 142/77  Pulse: 77 62 62 69  Resp: 18 18 18 18   Temp: 98 F (36.7 C) 97.6 F (36.4 C) 97.6 F (36.4 C) (!) 97.2 F (36.2 C)  TempSrc: Oral Oral Oral Oral  SpO2: 94% 95% 91% 92%  Weight:   74.8 kg   Height:        Intake/Output Summary (Last 24 hours) at 02/17/2022 1035 Last data filed at 02/17/2022 1023 Gross per 24 hour  Intake 960  ml  Output 2175 ml  Net -1215 ml   Filed Weights   02/15/22 1447 02/16/22 0513 02/17/22 0411  Weight: 75.9 kg 76.2 kg 74.8 kg     Examination:  General exam: Pleasant elderly male sitting up in the recliner, AAOx3 Respiratory system: Poor air movement bilaterally, fine bilateral rales in the lower lobes Cardiovascular system: S1 & S2 heard, RRR.  Abd: nondistended, soft and nontender.Normal bowel sounds heard. Central nervous system: Alert and oriented. No focal neurological deficits. Extremities: Trace edema Skin: No rashes Psychiatry:  Mood & affect appropriate.     Data Reviewed:   CBC: Recent Labs  Lab 02/15/22 0917 02/15/22 0925 02/16/22 0102 02/17/22 0350  WBC 9.4  --  4.1 15.4*  NEUTROABS 7.5  --   --   --   HGB 14.8 15.6 12.4* 12.1*  HCT 44.6 46.0 37.8* 36.9*  MCV 92.5  --  92.4 93.7  PLT 253  --  201 093   Basic Metabolic Panel: Recent Labs  Lab 02/15/22 0917 02/15/22 0925 02/16/22 0102 02/17/22 0350  NA 139 144 139 137  K 3.1* 3.1* 4.2 4.6  CL 109  --  108 105  CO2 20*  --  24 22  GLUCOSE 101*  --  179* 104*  BUN 7*  --  9 18  CREATININE 1.12  --  1.12 1.06  CALCIUM 8.6*  --  8.6* 9.2  MG 1.4*  --   --   --    GFR: Estimated Creatinine Clearance: 60 mL/min (by C-G formula based on SCr of 1.06 mg/dL). Liver Function Tests: Recent Labs  Lab 02/15/22 0917  AST 21  ALT 11  ALKPHOS 73  BILITOT 1.1  PROT 6.9  ALBUMIN 3.5   Recent Labs  Lab 02/15/22 0917  LIPASE 67*   No results for input(s): AMMONIA in the last 168 hours. Coagulation Profile: No results for input(s): INR, PROTIME in the last 168 hours. Cardiac Enzymes: No results for input(s): CKTOTAL, CKMB, CKMBINDEX, TROPONINI in the last 168 hours. BNP (last 3 results) No results for input(s): PROBNP in the last 8760 hours. HbA1C: No results for input(s): HGBA1C in the last 72 hours. CBG: No results for input(s): GLUCAP in the last 168 hours. Lipid Profile: No results for input(s): CHOL, HDL, LDLCALC, TRIG, CHOLHDL, LDLDIRECT in the last 72 hours. Thyroid Function Tests: No results for input(s): TSH,  T4TOTAL, FREET4, T3FREE, THYROIDAB in the last 72 hours. Anemia Panel: No results for input(s): VITAMINB12, FOLATE, FERRITIN, TIBC, IRON, RETICCTPCT in the last 72 hours. Urine analysis:    Component Value Date/Time   COLORURINE YELLOW 07/31/2021 1845   APPEARANCEUR CLEAR 07/31/2021 1845   LABSPEC <1.005 (L) 07/31/2021 1845   PHURINE 6.5 07/31/2021 1845   GLUCOSEU NEGATIVE 07/31/2021 1845   HGBUR NEGATIVE 07/31/2021 1845   BILIRUBINUR NEGATIVE 07/31/2021 1845   KETONESUR NEGATIVE 07/31/2021 1845   PROTEINUR NEGATIVE 07/31/2021 1845   UROBILINOGEN 0.2 06/27/2014 1315   NITRITE NEGATIVE 07/31/2021 1845   LEUKOCYTESUR NEGATIVE 07/31/2021 1845   Sepsis Labs: @LABRCNTIP (procalcitonin:4,lacticidven:4)  ) Recent Results (from the past 240 hour(s))  Resp Panel by RT-PCR (Flu A&B, Covid) Nasopharyngeal Swab     Status: None   Collection Time: 02/15/22  9:45 AM   Specimen: Nasopharyngeal Swab; Nasopharyngeal(NP) swabs in vial transport medium  Result Value Ref Range Status   SARS Coronavirus 2 by RT PCR NEGATIVE NEGATIVE Final    Comment: (NOTE) SARS-CoV-2 target nucleic acids are NOT DETECTED.  The SARS-CoV-2  RNA is generally detectable in upper respiratory specimens during the acute phase of infection. The lowest concentration of SARS-CoV-2 viral copies this assay can detect is 138 copies/mL. A negative result does not preclude SARS-Cov-2 infection and should not be used as the sole basis for treatment or other patient management decisions. A negative result may occur with  improper specimen collection/handling, submission of specimen other than nasopharyngeal swab, presence of viral mutation(s) within the areas targeted by this assay, and inadequate number of viral copies(<138 copies/mL). A negative result must be combined with clinical observations, patient history, and epidemiological information. The expected result is Negative.  Fact Sheet for Patients:   EntrepreneurPulse.com.au  Fact Sheet for Healthcare Providers:  IncredibleEmployment.be  This test is no t yet approved or cleared by the [place] FDA and  has been authorized for detection and/or diagnosis of SARS-CoV-2 by FDA under an Emergency Use Authorization (EUA). This EUA will remain  in effect (meaning this test can be used) for the duration of the COVID-19 declaration under Section 564(b)(1) of the Act, 21 U.S.C.section 360bbb-3(b)(1), unless the authorization is terminated  or revoked sooner.       Influenza A by PCR NEGATIVE NEGATIVE Final   Influenza B by PCR NEGATIVE NEGATIVE Final    Comment: (NOTE) The Xpert Xpress SARS-CoV-2/FLU/RSV plus assay is intended as an aid in the diagnosis of influenza from Nasopharyngeal swab specimens and should not be used as a sole basis for treatment. Nasal washings and aspirates are unacceptable for Xpert Xpress SARS-CoV-2/FLU/RSV testing.  Fact Sheet for Patients: EntrepreneurPulse.com.au  Fact Sheet for Healthcare Providers: IncredibleEmployment.be  This test is not yet approved or cleared by the [place] FDA and has been authorized for detection and/or diagnosis of SARS-CoV-2 by FDA under an Emergency Use Authorization (EUA). This EUA will remain in effect (meaning this test can be used) for the duration of the COVID-19 declaration under Section 564(b)(1) of the Act, 21 U.S.C. section 360bbb-3(b)(1), unless the authorization is terminated or revoked.  Performed at [facility], [address] [place], [place] [address]      Radiology Studies: ECHOCARDIOGRAM COMPLETE  Result Date: 02/16/2022    ECHOCARDIOGRAM REPORT   Patient Name:   TEREL BANN Date of Exam: 02/16/2022 Medical Rec #:  [id number]     Height:       68.0 in Accession #:    [id number]    Weight:       168.0 lb Date of Birth:  1946-02-29     BSA:          1.898 m  Patient Age:    31 years      BP:           131/76 mmHg Patient Gender: M             HR:           71 bpm. Exam Location:  Inpatient Procedure: 2D Echo, Cardiac Doppler, Color Doppler and Strain Analysis Indications:    Dyspnea  History:        Patient has prior history of Echocardiogram examinations, most                 recent 09/16/2021. CAD, Stroke, Arrythmias:Atrial Fibrillation                 and Bradycardia; Risk Factors:Hypertension. 04/15/16 pacemaker.  Sonographer:    Luisa Hart RDCS Referring Phys: [place]  1. Left ventricular ejection  fraction, by estimation, is 30 to 35%. The left ventricle has moderately decreased function. The left ventricle demonstrates global hypokinesis. Left ventricular diastolic parameters are consistent with Grade I diastolic dysfunction (impaired relaxation). There is severe hypokinesis of the left ventricular, entire inferolateral wall and inferior wall.  2. Right ventricular systolic function is mildly reduced. The right ventricular size is moderately enlarged.  3. Left atrial size was mildly dilated.  4. Right atrial size was mildly dilated.  5. The mitral valve is normal in structure. No evidence of mitral valve regurgitation.  6. Tricuspid valve regurgitation is moderate.  7. The aortic valve is tricuspid. There is mild calcification of the aortic valve. There is mild thickening of the aortic valve. Aortic valve regurgitation is mild to moderate. Aortic valve sclerosis/calcification is present, without any evidence of aortic stenosis.  8. There is borderline dilatation of the ascending aorta, measuring 38 mm. Comparison(s): The left ventricular function is worsened. The left ventricular wall motion abnormalities are unchanged. FINDINGS  Left Ventricle: Left ventricular ejection fraction, by estimation, is 30 to 35%. The left ventricle has moderately decreased function. The left ventricle demonstrates global hypokinesis. Severe hypokinesis of the  left ventricular, entire inferolateral wall and inferior wall. The left ventricular internal cavity size was normal in size. There is no left ventricular hypertrophy. Left ventricular diastolic parameters are consistent with Grade I diastolic dysfunction (impaired relaxation). Indeterminate filling pressures. Right Ventricle: The right ventricular size is moderately enlarged. No increase in right ventricular wall thickness. Right ventricular systolic function is mildly reduced. Left Atrium: Left atrial size was mildly dilated. Right Atrium: Right atrial size was mildly dilated. Prominent Eustachian valve. Pericardium: There is no evidence of pericardial effusion. Mitral Valve: The mitral valve is normal in structure. No evidence of mitral valve regurgitation. Tricuspid Valve: The tricuspid valve is normal in structure. Tricuspid valve regurgitation is moderate. Aortic Valve: The aortic valve is tricuspid. There is mild calcification of the aortic valve. There is mild thickening of the aortic valve. Aortic valve regurgitation is mild to moderate. Aortic regurgitation PHT measures 721 msec. Aortic valve sclerosis/calcification is present, without any evidence of aortic stenosis. Aortic valve mean gradient measures 6.5 mmHg. Aortic valve peak gradient measures 12.5 mmHg. Aortic valve area, by VTI measures 4.54 cm. Pulmonic Valve: The pulmonic valve was normal in structure. Pulmonic valve regurgitation is not visualized. Aorta: The aortic root is normal in size and structure. There is borderline dilatation of the ascending aorta, measuring 38 mm. IAS/Shunts: No atrial level shunt detected by color flow Doppler. Additional Comments: A device lead is visualized in the right ventricle.  LEFT VENTRICLE PLAX 2D LVOT diam:     3.20 cm      Diastology LV SV:         151          LV e' medial:    4.13 cm/s LV SV Index:   79           LV E/e' medial:  11.5 LVOT Area:     8.04 cm     LV e' lateral:   2.94 cm/s                              LV E/e' lateral: 16.2  LV Volumes (MOD)            2D Longitudinal Strain LV vol d, MOD A2C: 130.0 ml 2D Strain GLS Avg:     -  12.2 % LV vol d, MOD A4C: 149.0 ml LV vol s, MOD A2C: 60.8 ml LV vol s, MOD A4C: 95.5 ml LV SV MOD A2C:     69.2 ml LV SV MOD A4C:     149.0 ml LV SV MOD BP:      63.4 ml RIGHT VENTRICLE RV Basal diam:  5.00 cm RV Mid diam:    3.10 cm RV S prime:     11.70 cm/s TAPSE (M-mode): 1.8 cm LEFT ATRIUM           Index        RIGHT ATRIUM           Index LA diam:      3.30 cm 1.74 cm/m   RA Area:     25.00 cm LA Vol (A2C): 46.2 ml 24.34 ml/m  RA Volume:   95.80 ml  50.48 ml/m LA Vol (A4C): 55.8 ml 29.40 ml/m  AORTIC VALVE                     PULMONIC VALVE AV Area (Vmax):    4.43 cm      PV Vmax:       0.73 m/s AV Area (Vmean):   4.21 cm      PV Vmean:      49.200 cm/s AV Area (VTI):     4.54 cm      PV VTI:        0.176 m AV Vmax:           176.50 cm/s   PV Peak grad:  2.1 mmHg AV Vmean:          117.000 cm/s  PV Mean grad:  1.0 mmHg AV VTI:            0.332 m AV Peak Grad:      12.5 mmHg AV Mean Grad:      6.5 mmHg LVOT Vmax:         97.15 cm/s LVOT Vmean:        61.250 cm/s LVOT VTI:          0.188 m LVOT/AV VTI ratio: 0.56 AI PHT:            721 msec  AORTA Ao Asc diam: 3.80 cm MITRAL VALVE                TRICUSPID VALVE MV Area (PHT): 3.99 cm     TR Peak grad:   62.7 mmHg MV Decel Time: 190 msec     TR Vmax:        396.00 cm/s MV E velocity: 47.50 cm/s MV A velocity: 107.00 cm/s  SHUNTS MV E/A ratio:  0.44         Systemic VTI:  0.19 m                             Systemic Diam: 3.20 cm Mihai Croitoru MD Electronically signed by Sanda Klein MD Signature Date/Time: 02/16/2022/3:02:16 PM    Final      Scheduled Meds:  amiodarone  100 mg Oral Daily   atorvastatin  40 mg Oral Daily   docusate sodium  100 mg Oral BID   feeding supplement  237 mL Oral BID BM   fluticasone  2 spray Each Nare Daily   furosemide  40 mg Intravenous Daily   levothyroxine  25 mcg Oral QAC  breakfast   losartan  100 mg Oral Daily   magnesium oxide  400 mg Oral 2 times per day on Tue Sat   magnesium oxide  400 mg Oral Once per day on Sun Mon Wed Thu Fri   metoprolol  200 mg Oral Daily   multivitamin with minerals  1 tablet Oral Daily   pantoprazole  40 mg Oral Daily   predniSONE  40 mg Oral Q breakfast   rivaroxaban  20 mg Oral Q supper   sodium chloride flush  3 mL Intravenous Q12H   spironolactone  25 mg Oral Daily   Continuous Infusions:   LOS: 2 days    Time spent: 56min    Domenic Polite, MD [facility]   02/17/2022, 10:35 AM

## 2022-02-17 NOTE — Consult Note (Signed)
Cardiology Consultation:   Patient ID: STETSON PELAEZ MRN: [id number]; DOB: Feb 09, 1946  Admit date: 02/15/2022 Date of Consult: 02/17/2022  PCP:  Mosie Lukes, MD   [facility] Providers Cardiologist:  None  Electrophysiologist:  Will Meredith Leeds, MD       Patient Profile:   BOY DELAMATER is a 76 y.o. male with a hx of CAD, CML (2012), HTN, CVA, HLD, COPD on home O2, afib on xarelto, tachy-brady syndrome s/p medtronic PPM (2017)  who is being seen 02/17/2022 for the evaluation of reduced EF at the request of Dr. Broadus John.  History of Present Illness:   Mr. Marcoux is a 76 year old male with above medical history. Patient has a remote history of CAD with stent placement in 2000 at [facility] (records unavailable). About a year and a half after stent placement, patient had a stress test that was negative. Patient had an eye surgery in 2013, and was found to have paroxysmal atrial fibrillation following the surgery. Echocardiogram on 10/02/2012 showed EF 60%. Patient developed atypical chest pain in 2015 and underwent a stress test in July 2015 that showed an EF of 47%, a fixed inferior lateral defect suggestive of scan, but no ischemia. At his outpatient follow up appointment, an abdominal bruit was noted. Abdominal ultrasound August 2015 showed a 3.9 cm abdominal aortic aneurysm. Patient continued to have atypical chest pain, and he developed dizziness and palpitations in 2016.  He underwent another nuclear stess test in April 2016 that showed large inferior, inferior septa, inferior lateral defect consistent with scarring, no ischemia, EF 45%. Patient wore a heart monitor in April 2016 that showed paroxysmal atrial fibrillation.  Patient was hospitalized in April 2017 for treatment of chest pressure, dizziness.  Patient was found to have tachybradycardia syndrome during that hospitalization.  Had a Medtronic pacemaker implanted on 04/15/2016.  Underwent a nuclear stress test on  04/15/2016 that showed no evidence of reversible ischemia, EF 43%, and scarring of the apical, mid, and basilar segment of the inferior wall.  Since then, patient has been followed by electro physiology, general cardiology.  Patient's most recent echocardiogram was completed on 09/16/2021, showed an EF of 45 to 50%, mild to moderate AI.  Patient's most recent ultrasound of his abdominal aortic aneurysm showed that the aneurysm was 4.5 x 3.5 cm, patient was told to follow-up with vascular surgery.  Patient's last device check showed that he was in a ventricular paced rhythm 1.2% of the time. Patient was recently admitted to the hospital from 12/15/21-12/19/21 for treatment of [place].   Patient presented to the ED on 2/20 complaining of increased shortness of breath.  Patient was found to be hypoxic in the ED, started on nonrebreather mask but later transitioned to nasal cannula with 4 L oxygen.  Patient was hospitalized for treatment of acute on chronic respiratory failure associated with COPD exacerbation.  Echocardiogram on 02/16/2022 showed EF 30 to 35% (down from 45 to 50%), global hypokinesis, grade I diastolic dysfunction, mild to moderate AI, mildly reduced RV function, moderate RV dilation. Due to decline in EF, cardiology was asked to consult.   Pertinent labs/findings are Na 137, K 4.6, creatinine 1.06, eGFR >60, hemoglobin 12.1, WBC 15.4. CXR showed coarse interstitial markings in both lungs consistent with chronic/fibrotic changes. CT chest showed no evidence of PE, no acute findings, fibrotic changes of the lungs, stable areas of nodularity in the lungs, diffuse bronchial wall thickening, aneurysmal dilationo f the infrarenal abdominal aorta  which measured up to 4.9x4.5 cm.   On interview patient reports that he has had worsening SOB since he was hospitalized at the end of December 2022. SOB is worse with exertion. Normally wears 2 L oxygen as needed at home, currently on 4 L while admitted. Patient  continues to have SOB when walking to the bathroom, in the halls. Denies any chest pain, palpitations. Has noticed some swelling in his ankles that has resolved since being given lasix injections.    Past Medical History:  Diagnosis Date   Anemia 05/08/2015   CML (chronic myelocytic leukemia) ([place]) 12/16/2011   Coronary heart disease    CVA (cerebrovascular accident) ([place])    GERD (gastroesophageal reflux disease)    Grief reaction 03/18/2014   H/O tobacco use, presenting hazards to health 02/24/2012   No cigarettes since pacemaker placement. Encouraged ongoing efforts    History of chicken pox    History of kidney stones    Hyperlipidemia    Hypertension    IBS (irritable bowel syndrome) 09/20/2013   Medicare annual wellness visit, subsequent 03/23/2014   PAF (paroxysmal atrial fibrillation) (HCC)    Symptomatic bradycardia    a. symptomatic bradycardia due to sinus node dysfunction s/p RA and RV Medtronic pacemaker 04/15/16    Past Surgical History:  Procedure Laterality Date   CATARACT EXTRACTION     CHOLECYSTECTOMY  2003   CORONARY ANGIOPLASTY WITH STENT PLACEMENT  2000   EP IMPLANTABLE DEVICE N/A 04/15/2016   Procedure: Pacemaker Implant;  Surgeon: Evans Lance, MD; Medtronic (serial number [id number]) pacemaker; Laterality: Left     Home Medications:  Prior to Admission medications   Medication Sig Start Date End Date Taking? Authorizing Provider  albuterol (VENTOLIN HFA) 108 (90 Base) MCG/ACT inhaler Inhale 2 puffs into the lungs every 6 (six) hours as needed. Patient taking differently: Inhale 2 puffs into the lungs every 6 (six) hours as needed for shortness of breath. 12/31/21  Yes Saguier, Percell Miller, PA-C  amiodarone (PACERONE) 200 MG tablet Take 1/2 (one-half) tablet by mouth once daily Patient taking differently: Take 100 mg by mouth daily. 10/08/21  Yes Camnitz, Will Hassell Done, MD  amLODipine (NORVASC) 5 MG tablet Take 1 tablet by mouth once daily 01/20/22  Yes Crenshaw, Denice Bors, MD  atorvastatin (LIPITOR) 40 MG tablet Take 1 tablet (40 mg total) by mouth daily. 10/12/21 10/12/22 Yes Camnitz, Will Hassell Done, MD  ferrous sulfate 325 (65 FE) MG EC tablet Take 1 tablet (325 mg total) by mouth 2 (two) times daily. Patient taking differently: Take 325 mg by mouth daily. 09/02/15  Yes Irene Shipper, MD  fluticasone [facility]) 50 MCG/ACT nasal spray Place 2 sprays into both nostrils daily. 07/07/21  Yes Mosie Lukes, MD  imatinib ([name]) 400 MG tablet TAKE 1 TABLET ($RemoveB'400MG'[id number]$  TOTAL) BY MOUTH DAILY. TAKE WITH A MEAL AND LARGE GLASS OF WATER. Patient taking differently: Take 400 mg by mouth daily. 10/03/21  Yes Volanda Napoleon, MD  levothyroxine (SYNTHROID) 25 MCG tablet Take 1 tablet (25 mcg total) by mouth daily before breakfast. 01/20/22  Yes Mosie Lukes, MD  losartan (COZAAR) 100 MG tablet Take 1 tablet (100 mg total) by mouth daily. 07/27/21  Yes Mosie Lukes, MD  magnesium oxide (MAG-OX) 400 MG tablet Take 1 tablet (400 mg total) by mouth daily. Except Tuesday and Saturday take it BID. Patient taking differently: Take 400-800 mg by mouth See admin instructions. Take one tablet by mouth every day except on Tuesdays and  Saturdays take two tablets by mouth per patient 11/17/21  Yes Mosie Lukes, MD  meclizine (ANTIVERT) 25 MG tablet TAKE 1 TABLET THREE TIMES DAILY Patient taking differently: Take 25 mg by mouth 3 (three) times daily as needed for dizziness. 09/14/19  Yes Mosie Lukes, MD  metoprolol succinate (TOPROL-XL) 200 MG 24 hr tablet Take 1 tablet (200 mg total) by mouth daily. Take with or immediately following a meal. 02/10/22  Yes Camnitz, Ocie Doyne, MD  nitroGLYCERIN (NITROSTAT) 0.4 MG SL tablet Place 1 tablet (0.4 mg total) under the tongue every 5 (five) minutes as needed for chest pain. 08/18/21  Yes Mosie Lukes, MD  ondansetron (ZOFRAN) 4 MG tablet Take 1 tablet (4 mg total) by mouth 2 (two) times daily as needed for nausea or vomiting. 07/02/21  Yes Ennever,  Rudell Cobb, MD  rivaroxaban (XARELTO) 20 MG TABS tablet TAKE 1 TABLET BY MOUTH ONCE DAILY WITH SUPPER Patient taking differently: 20 mg daily. 10/26/21  Yes Camnitz, Will Hassell Done, MD  Saccharomyces boulardii (PROBIOTIC) 250 MG CAPS Take 250 mg by mouth daily. 01/20/21  Yes Mosie Lukes, MD  vitamin B-12 (CYANOCOBALAMIN) 500 MCG tablet Take 500 mcg by mouth daily.   Yes [provider]  omeprazole (PRILOSEC) 40 MG capsule Take 1 capsule (40 mg total) by mouth daily. 02/09/22   Mosie Lukes, MD    Inpatient Medications: Scheduled Meds:  amiodarone  100 mg Oral Daily   atorvastatin  40 mg Oral Daily   docusate sodium  100 mg Oral BID   feeding supplement  237 mL Oral BID BM   fluticasone  2 spray Each Nare Daily   furosemide  40 mg Intravenous Daily   levothyroxine  25 mcg Oral QAC breakfast   magnesium oxide  400 mg Oral 2 times per day on Tue Sat   magnesium oxide  400 mg Oral Once per day on Sun Mon Wed Thu Fri   metoprolol  200 mg Oral Daily   multivitamin with minerals  1 tablet Oral Daily   pantoprazole  40 mg Oral Daily   predniSONE  40 mg Oral Q breakfast   rivaroxaban  20 mg Oral Q supper   [START ON 02/18/2022] sacubitril-valsartan  1 tablet Oral BID   sodium chloride flush  3 mL Intravenous Q12H   spironolactone  25 mg Oral Daily   Continuous Infusions:  PRN Meds: acetaminophen **OR** acetaminophen, albuterol, bisacodyl, guaiFENesin, hydrALAZINE, ipratropium-albuterol, ondansetron **OR** ondansetron (ZOFRAN) IV, oxyCODONE, polyethylene glycol  Allergies:   No Known Allergies  Social History:   Social History   Socioeconomic History   Marital status: Widowed    Spouse name: Not on file   Number of children: 3   Years of education: Not on file   Highest education level: Not on file  Occupational History   Occupation: Retired    Comment: Retired  Tobacco Use   Smoking status: Former    Packs/day: 0.50    Years: 42.00    Pack years: 21.00    Types:  Cigarettes    Start date: 10/30/1967    Quit date: 04/14/2016    Years since quitting: 5.8   Smokeless tobacco: Never  Vaping Use   Vaping Use: Never used  Substance and Sexual Activity   Alcohol use: No    Alcohol/week: 0.0 standard drinks    Comment: Rare   Drug use: No   Sexual activity: Never    Comment: wife died in 2023/01/15 married  63 years, grandson livew with patient  Other Topics Concern   Not on file  Social History Narrative   Not on file   Social Determinants of Health   Financial Resource Strain: Low Risk    Difficulty of Paying Living Expenses: Not hard at all  Food Insecurity: No Food Insecurity   Worried About Charity fundraiser in the Last Year: Never true   Arboriculturist in the Last Year: Never true  Transportation Needs: No Transportation Needs   Lack of Transportation (Medical): No   Lack of Transportation (Non-Medical): No  Physical Activity: Inactive   Days of Exercise per Week: 0 days   Minutes of Exercise per Session: 0 min  Stress: No Stress Concern Present   Feeling of Stress : Not at all  Social Connections: Moderately Isolated   Frequency of Communication with Friends and Family: More than three times a week   Frequency of Social Gatherings with Friends and Family: Once a week   Attends Religious Services: 1 to 4 times per year   Active Member of [employer] or Organizations: No   Attends Archivist Meetings: Never   Marital Status: Widowed  Human resources officer Violence: Not At Risk   Fear of Current or Ex-Partner: No   Emotionally Abused: No   Physically Abused: No   Sexually Abused: No    Family History:    Family History  Problem Relation Age of Onset   Arthritis Mother    Hypertension Mother    Heart disease Mother        Rheumatic fever   Rheumatic fever Mother    Other Mother        brain tumor   Cancer Mother        leukemia   Hypertension Father    Alcohol abuse Father    Diabetes Son    Colon cancer Neg Hx    Breast  cancer Neg Hx    Prostate cancer Neg Hx      ROS:  Please see the history of present illness.   All other ROS reviewed and negative.     Physical Exam/Data:   Vitals:   02/16/22 1330 02/16/22 1954 02/17/22 0411 02/17/22 0728  BP: 132/74 122/75 138/76 (!) 142/77  Pulse: 77 62 62 69  Resp: $Remo'18 18 18 18  'nilYd$ Temp: 98 F (36.7 C) 97.6 F (36.4 C) 97.6 F (36.4 C) (!) 97.2 F (36.2 C)  TempSrc: Oral Oral Oral Oral  SpO2: 94% 95% 91% 92%  Weight:   74.8 kg   Height:        Intake/Output Summary (Last 24 hours) at 02/17/2022 1225 Last data filed at 02/17/2022 1154 Gross per 24 hour  Intake 960 ml  Output 2900 ml  Net -1940 ml   Last 3 Weights 02/17/2022 02/16/2022 02/15/2022  Weight (lbs) 164 lb 14.4 oz 167 lb 15.9 oz 167 lb 5.3 oz  Weight (kg) 74.798 kg 76.2 kg 75.9 kg     Body mass index is 25.07 kg/m.  General:  Chronically ill appearing, no acute distress.  HEENT: normal Neck: no JVD Vascular: Radial pulses 2+ bilaterally Cardiac:  normal S1, S2; Irregular rate and rhythm; no murmur  Lungs:  Distant breath sounds, expiratory wheezes, decreased breath sounds in lung bases Abd: soft, nontender, no hepatomegaly  Ext: Trace edema in BLE  Musculoskeletal:  No deformities  Skin: warm and dry  Neuro:  CNs 2-12 intact, no focal abnormalities noted Psych:  Normal  affect   EKG:  The EKG was personally reviewed and demonstrates:  Initial EKG in the ED showed Afib with RVR, rate 144, RBBB  Telemetry:  Telemetry was personally reviewed and demonstrates:  Paced rhythm, atrial fibrillation, HR well controlled  Relevant CV Studies:  Echo 02/16/2022   1. Left ventricular ejection fraction, by estimation, is 30 to 35%. The  left ventricle has moderately decreased function. The left ventricle  demonstrates global hypokinesis. Left ventricular diastolic parameters are  consistent with Grade I diastolic  dysfunction (impaired relaxation). There is severe hypokinesis of the left   ventricular, entire inferolateral wall and inferior wall.   2. Right ventricular systolic function is mildly reduced. The right  ventricular size is moderately enlarged.   3. Left atrial size was mildly dilated.   4. Right atrial size was mildly dilated.   5. The mitral valve is normal in structure. No evidence of mitral valve  regurgitation.   6. Tricuspid valve regurgitation is moderate.   7. The aortic valve is tricuspid. There is mild calcification of the  aortic valve. There is mild thickening of the aortic valve. Aortic valve  regurgitation is mild to moderate. Aortic valve sclerosis/calcification is  present, without any evidence of  aortic stenosis.   8. There is borderline dilatation of the ascending aorta, measuring 38  mm.   Comparison(s): The left ventricular function is worsened. The left  ventricular wall motion abnormalities are unchanged.   Laboratory Data:  High Sensitivity Troponin:   Recent Labs  Lab 02/15/22 0920 02/15/22 1119  TROPONINIHS 26* 29*     Chemistry Recent Labs  Lab 02/15/22 0917 02/15/22 0925 02/16/22 0102 02/17/22 0350  NA 139 144 139 137  K 3.1* 3.1* 4.2 4.6  CL 109  --  108 105  CO2 20*  --  24 22  GLUCOSE 101*  --  179* 104*  BUN 7*  --  9 18  CREATININE 1.12  --  1.12 1.06  CALCIUM 8.6*  --  8.6* 9.2  MG 1.4*  --   --   --   GFRNONAA >60  --  >60 >60  ANIONGAP 10  --  7 10    Recent Labs  Lab 02/15/22 0917  PROT 6.9  ALBUMIN 3.5  AST 21  ALT 11  ALKPHOS 73  BILITOT 1.1   Lipids No results for input(s): CHOL, TRIG, HDL, LABVLDL, LDLCALC, CHOLHDL in the last 168 hours.  Hematology Recent Labs  Lab 02/15/22 [id number] 02/15/22 0925 02/16/22 0102 02/17/22 0350  WBC 9.4  --  4.1 15.4*  RBC 4.82  --  4.09* 3.94*  HGB 14.8 15.6 12.4* 12.1*  HCT 44.6 46.0 37.8* 36.9*  MCV 92.5  --  92.4 93.7  MCH 30.7  --  30.3 30.7  MCHC 33.2  --  32.8 32.8  RDW 15.9*  --  15.8* 16.0*  PLT 253  --  201 215   Thyroid No results for  input(s): TSH, FREET4 in the last 168 hours.  BNPNo results for input(s): BNP, PROBNP in the last 168 hours.  DDimer  Recent Labs  Lab 02/15/22 [phone]  DDIMER 2.03*     Radiology/Studies:  CT Angio Chest PE W and/or Wo Contrast  Result Date: 02/15/2022 CLINICAL DATA:  76 year old male with history of acute onset of nonlocalized abdominal pain, hypoxia and increased work of breathing. Additional history of chronic myelocytic leukemia (CML). EXAM: CT ANGIOGRAPHY CHEST CT ABDOMEN AND PELVIS WITH CONTRAST TECHNIQUE: Multidetector CT imaging  of the chest was performed using the standard protocol during bolus administration of intravenous contrast. Multiplanar CT image reconstructions and MIPs were obtained to evaluate the vascular anatomy. Multidetector CT imaging of the abdomen and pelvis was performed using the standard protocol during bolus administration of intravenous contrast. RADIATION DOSE REDUCTION: This exam was performed according to the departmental dose-optimization program which includes automated exposure control, adjustment of the mA and/or kV according to patient size and/or use of iterative reconstruction technique. CONTRAST:  174mL OMNIPAQUE IOHEXOL 350 MG/ML SOLN COMPARISON:  CT of the chest, abdomen and pelvis 12/15/2021. FINDINGS: CTA CHEST FINDINGS Cardiovascular: There are no filling defects within the pulmonary arterial tree to suggest pulmonary embolism. Heart size is normal. There is no significant pericardial fluid, thickening or pericardial calcification. There is aortic atherosclerosis, as well as atherosclerosis of the great vessels of the mediastinum and the coronary arteries, including calcified atherosclerotic plaque in the left main, left anterior descending, left circumflex and right coronary arteries. Left-sided pacemaker device in place with lead tips terminating in the right atrium and right ventricle. Mediastinum/Nodes: No pathologically enlarged mediastinal or hilar  lymph nodes. Hilar esophagus is unremarkable in appearance. No axillary lymphadenopathy. Lungs/Pleura: There are some small nodules scattered throughout the left upper lobe, similar to the prior study, largest of which (axial image 42 of series 6) measures 9 x 5 mm (mean diameter of 7 mm). No other new suspicious appearing pulmonary nodules or masses are noted. There are widespread areas of ground-glass attenuation and septal thickening in the lungs bilaterally (right greater than left), with some associated areas of peripheral bronchiolectasis. These findings are unchanged. Mild centrilobular and paraseptal emphysema, most apparent in the lung apices. No confluent consolidative airspace disease. Trace right pleural effusion. No left pleural effusion. Musculoskeletal: There are no aggressive appearing lytic or blastic lesions noted in the visualized portions of the skeleton. Review of the MIP images confirms the above findings. CT ABDOMEN and PELVIS FINDINGS Hepatobiliary: No suspicious cystic or solid hepatic lesions. No intra or extrahepatic biliary ductal dilatation. Status post cholecystectomy. Pancreas: Large coarse calcification in the proximal pancreatic body. No pancreatic mass. No pancreatic ductal dilatation. No peripancreatic fluid collections or inflammatory changes. Spleen: Unremarkable. Adrenals/Urinary Tract: Subcentimeter low-attenuation lesions in both kidneys, too small to characterize, but similar to prior studies and statistically likely to represent tiny cysts. No aggressive appearing renal lesions. No hydroureteronephrosis. Urinary bladder is normal in appearance. Bilateral adrenal glands are normal in appearance. Stomach/Bowel: The appearance of the stomach is normal. No pathologic dilatation of small bowel or colon. Normal appendix. Vascular/Lymphatic: Aortic atherosclerosis with fusiform aneurysmal dilatation of the infrarenal abdominal aorta which measures up to 4.9 x 4.5 cm (axial image 36  of series 2), with large burden of atheromatous plaque and/or mural thrombus in the aneurysmally dilated portion. No lymphadenopathy noted in the abdomen or pelvis. Reproductive: Prostate gland and seminal vesicles are unremarkable in appearance. Other: Trace volume of ascites.  No pneumoperitoneum. Musculoskeletal: There are no aggressive appearing lytic or blastic lesions noted in the visualized portions of the skeleton. Review of the MIP images confirms the above findings. IMPRESSION: 1. No evidence of pulmonary embolism. 2. No acute findings are noted in the chest, abdomen or pelvis to account for the patient's symptoms. 3. Unusual appearance of the lungs with asymmetrically distributed fibrotic changes (involving the right lung to a greater extent than the left), stable compared to the prior examination, as detailed above. Findings are most compatible with an alternative diagnosis (not  usual interstitial pneumonia) per current ATS guidelines, favored to reflect fibrotic phase nonspecific interstitial pneumonia (NSIP). 4. Scattered areas of nodularity in the lungs, stable compared to the prior study, favored to be benign, but continued attention on routine follow-up studies is recommended to ensure stability. The largest of these pulmonary nodules in the left upper lobe again demonstrates a mean diameter of 7 mm. 5. Aortic atherosclerosis, in addition to left main and three-vessel coronary artery disease. Fusiform aneurysmal dilatation of the infrarenal abdominal aorta which measures up to 4.9 x 4.5 cm. Recommend follow-up CT/MR every 6 months and vascular consultation. This recommendation follows ACR consensus guidelines: White Paper of the ACR Incidental Findings Committee II on Vascular Findings. J Am Coll Radiol 2013; 10:789-794. 6. Diffuse bronchial wall thickening with mild centrilobular and paraseptal emphysema; imaging findings suggestive of underlying COPD. 7. Additional incidental findings, as above.  Electronically Signed   By: Vinnie Langton M.D.   On: 02/15/2022 10:50   CT ABDOMEN PELVIS W CONTRAST  Result Date: 02/15/2022 CLINICAL DATA:  76 year old male with history of acute onset of nonlocalized abdominal pain, hypoxia and increased work of breathing. Additional history of chronic myelocytic leukemia (CML). EXAM: CT ANGIOGRAPHY CHEST CT ABDOMEN AND PELVIS WITH CONTRAST TECHNIQUE: Multidetector CT imaging of the chest was performed using the standard protocol during bolus administration of intravenous contrast. Multiplanar CT image reconstructions and MIPs were obtained to evaluate the vascular anatomy. Multidetector CT imaging of the abdomen and pelvis was performed using the standard protocol during bolus administration of intravenous contrast. RADIATION DOSE REDUCTION: This exam was performed according to the departmental dose-optimization program which includes automated exposure control, adjustment of the mA and/or kV according to patient size and/or use of iterative reconstruction technique. CONTRAST:  12mL OMNIPAQUE IOHEXOL 350 MG/ML SOLN COMPARISON:  CT of the chest, abdomen and pelvis 12/15/2021. FINDINGS: CTA CHEST FINDINGS Cardiovascular: There are no filling defects within the pulmonary arterial tree to suggest pulmonary embolism. Heart size is normal. There is no significant pericardial fluid, thickening or pericardial calcification. There is aortic atherosclerosis, as well as atherosclerosis of the great vessels of the mediastinum and the coronary arteries, including calcified atherosclerotic plaque in the left main, left anterior descending, left circumflex and right coronary arteries. Left-sided pacemaker device in place with lead tips terminating in the right atrium and right ventricle. Mediastinum/Nodes: No pathologically enlarged mediastinal or hilar lymph nodes. Hilar esophagus is unremarkable in appearance. No axillary lymphadenopathy. Lungs/Pleura: There are some small nodules  scattered throughout the left upper lobe, similar to the prior study, largest of which (axial image 42 of series 6) measures 9 x 5 mm (mean diameter of 7 mm). No other new suspicious appearing pulmonary nodules or masses are noted. There are widespread areas of ground-glass attenuation and septal thickening in the lungs bilaterally (right greater than left), with some associated areas of peripheral bronchiolectasis. These findings are unchanged. Mild centrilobular and paraseptal emphysema, most apparent in the lung apices. No confluent consolidative airspace disease. Trace right pleural effusion. No left pleural effusion. Musculoskeletal: There are no aggressive appearing lytic or blastic lesions noted in the visualized portions of the skeleton. Review of the MIP images confirms the above findings. CT ABDOMEN and PELVIS FINDINGS Hepatobiliary: No suspicious cystic or solid hepatic lesions. No intra or extrahepatic biliary ductal dilatation. Status post cholecystectomy. Pancreas: Large coarse calcification in the proximal pancreatic body. No pancreatic mass. No pancreatic ductal dilatation. No peripancreatic fluid collections or inflammatory changes. Spleen: Unremarkable. Adrenals/Urinary Tract: Subcentimeter low-attenuation  lesions in both kidneys, too small to characterize, but similar to prior studies and statistically likely to represent tiny cysts. No aggressive appearing renal lesions. No hydroureteronephrosis. Urinary bladder is normal in appearance. Bilateral adrenal glands are normal in appearance. Stomach/Bowel: The appearance of the stomach is normal. No pathologic dilatation of small bowel or colon. Normal appendix. Vascular/Lymphatic: Aortic atherosclerosis with fusiform aneurysmal dilatation of the infrarenal abdominal aorta which measures up to 4.9 x 4.5 cm (axial image 36 of series 2), with large burden of atheromatous plaque and/or mural thrombus in the aneurysmally dilated portion. No  lymphadenopathy noted in the abdomen or pelvis. Reproductive: Prostate gland and seminal vesicles are unremarkable in appearance. Other: Trace volume of ascites.  No pneumoperitoneum. Musculoskeletal: There are no aggressive appearing lytic or blastic lesions noted in the visualized portions of the skeleton. Review of the MIP images confirms the above findings. IMPRESSION: 1. No evidence of pulmonary embolism. 2. No acute findings are noted in the chest, abdomen or pelvis to account for the patient's symptoms. 3. Unusual appearance of the lungs with asymmetrically distributed fibrotic changes (involving the right lung to a greater extent than the left), stable compared to the prior examination, as detailed above. 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 4. Scattered areas of nodularity in the lungs, stable compared to the prior study, favored to be benign, but continued attention on routine follow-up studies is recommended to ensure stability. The largest of these pulmonary nodules in the left upper lobe again demonstrates a mean diameter of 7 mm. 5. Aortic atherosclerosis, in addition to left main and three-vessel coronary artery disease. Fusiform aneurysmal dilatation of the infrarenal abdominal aorta which measures up to 4.9 x 4.5 cm. Recommend follow-up CT/MR every 6 months and vascular consultation. This recommendation follows ACR consensus guidelines: White Paper of the ACR Incidental Findings Committee II on Vascular Findings. J Am Coll Radiol 2013; 10:789-794. 6. Diffuse bronchial wall thickening with mild centrilobular and paraseptal emphysema; imaging findings suggestive of underlying COPD. 7. Additional incidental findings, as above. Electronically Signed   By: Vinnie Langton M.D.   On: 02/15/2022 10:50   DG Chest Portable 1 View  Result Date:  02/15/2022 CLINICAL DATA:  Hypoxia, increased work of breathing EXAM: PORTABLE CHEST 1 VIEW COMPARISON:  Chest radiograph 12/15/2021 FINDINGS: A left chest wall cardiac device is stable. The cardiomediastinal silhouette is stable. There are increased interstitial markings in both lungs, more so on the right, consistent with fibrotic change as seen on prior CT. There is no definite new superimposed focal airspace disease. There is no pleural effusion or pneumothorax There is no acute osseous abnormality. IMPRESSION: Coarse interstitial markings in both lungs consistent with chronic/fibrotic changes, without definite new superimposed focal airspace disease. Electronically Signed   By: Valetta Mole M.D.   On: 02/15/2022 09:40   ECHOCARDIOGRAM COMPLETE  Result Date: 02/16/2022    ECHOCARDIOGRAM REPORT   Patient Name:   JAYLAND NULL Date of Exam: 02/16/2022 Medical Rec #:  [id number]     Height:       68.0 in Accession #:    [id number]    Weight:       168.0 lb Date of Birth:  14-Sep-1946     BSA:          1.898 m Patient Age:    63 years      BP:  131/76 mmHg Patient Gender: M             HR:           71 bpm. Exam Location:  Inpatient Procedure: 2D Echo, Cardiac Doppler, Color Doppler and Strain Analysis Indications:    Dyspnea  History:        Patient has prior history of Echocardiogram examinations, most                 recent 09/16/2021. CAD, Stroke, Arrythmias:Atrial Fibrillation                 and Bradycardia; Risk Factors:Hypertension. 04/15/16 pacemaker.  Sonographer:    Neomia Dear RDCS Referring Phys: [id number] PREETHA JOSEPH IMPRESSIONS  1. Left ventricular ejection fraction, by estimation, is 30 to 35%. The left ventricle has moderately decreased function. The left ventricle demonstrates global hypokinesis. Left ventricular diastolic parameters are consistent with Grade I diastolic dysfunction (impaired relaxation). There is severe hypokinesis of the left ventricular, entire inferolateral wall and  inferior wall.  2. Right ventricular systolic function is mildly reduced. The right ventricular size is moderately enlarged.  3. Left atrial size was mildly dilated.  4. Right atrial size was mildly dilated.  5. The mitral valve is normal in structure. No evidence of mitral valve regurgitation.  6. Tricuspid valve regurgitation is moderate.  7. The aortic valve is tricuspid. There is mild calcification of the aortic valve. There is mild thickening of the aortic valve. Aortic valve regurgitation is mild to moderate. Aortic valve sclerosis/calcification is present, without any evidence of aortic stenosis.  8. There is borderline dilatation of the ascending aorta, measuring 38 mm. Comparison(s): The left ventricular function is worsened. The left ventricular wall motion abnormalities are unchanged. FINDINGS  Left Ventricle: Left ventricular ejection fraction, by estimation, is 30 to 35%. The left ventricle has moderately decreased function. The left ventricle demonstrates global hypokinesis. Severe hypokinesis of the left ventricular, entire inferolateral wall and inferior wall. The left ventricular internal cavity size was normal in size. There is no left ventricular hypertrophy. Left ventricular diastolic parameters are consistent with Grade I diastolic dysfunction (impaired relaxation). Indeterminate filling pressures. Right Ventricle: The right ventricular size is moderately enlarged. No increase in right ventricular wall thickness. Right ventricular systolic function is mildly reduced. Left Atrium: Left atrial size was mildly dilated. Right Atrium: Right atrial size was mildly dilated. Prominent Eustachian valve. Pericardium: There is no evidence of pericardial effusion. Mitral Valve: The mitral valve is normal in structure. No evidence of mitral valve regurgitation. Tricuspid Valve: The tricuspid valve is normal in structure. Tricuspid valve regurgitation is moderate. Aortic Valve: The aortic valve is tricuspid.  There is mild calcification of the aortic valve. There is mild thickening of the aortic valve. Aortic valve regurgitation is mild to moderate. Aortic regurgitation PHT measures 721 msec. Aortic valve sclerosis/calcification is present, without any evidence of aortic stenosis. Aortic valve mean gradient measures 6.5 mmHg. Aortic valve peak gradient measures 12.5 mmHg. Aortic valve area, by VTI measures 4.54 cm. Pulmonic Valve: The pulmonic valve was normal in structure. Pulmonic valve regurgitation is not visualized. Aorta: The aortic root is normal in size and structure. There is borderline dilatation of the ascending aorta, measuring 38 mm. IAS/Shunts: No atrial level shunt detected by color flow Doppler. Additional Comments: A device lead is visualized in the right ventricle.  LEFT VENTRICLE PLAX 2D LVOT diam:     3.20 cm      Diastology LV SV:  151          LV e' medial:    4.13 cm/s LV SV Index:   79           LV E/e' medial:  11.5 LVOT Area:     8.04 cm     LV e' lateral:   2.94 cm/s                             LV E/e' lateral: 16.2  LV Volumes (MOD)            2D Longitudinal Strain LV vol d, MOD A2C: 130.0 ml 2D Strain GLS Avg:     -12.2 % LV vol d, MOD A4C: 149.0 ml LV vol s, MOD A2C: 60.8 ml LV vol s, MOD A4C: 95.5 ml LV SV MOD A2C:     69.2 ml LV SV MOD A4C:     149.0 ml LV SV MOD BP:      63.4 ml RIGHT VENTRICLE RV Basal diam:  5.00 cm RV Mid diam:    3.10 cm RV S prime:     11.70 cm/s TAPSE (M-mode): 1.8 cm LEFT ATRIUM           Index        RIGHT ATRIUM           Index LA diam:      3.30 cm 1.74 cm/m   RA Area:     25.00 cm LA Vol (A2C): 46.2 ml 24.34 ml/m  RA Volume:   95.80 ml  50.48 ml/m LA Vol (A4C): 55.8 ml 29.40 ml/m  AORTIC VALVE                     PULMONIC VALVE AV Area (Vmax):    4.43 cm      PV Vmax:       0.73 m/s AV Area (Vmean):   4.21 cm      PV Vmean:      49.200 cm/s AV Area (VTI):     4.54 cm      PV VTI:        0.176 m AV Vmax:           176.50 cm/s   PV Peak grad:   2.1 mmHg AV Vmean:          117.000 cm/s  PV Mean grad:  1.0 mmHg AV VTI:            0.332 m AV Peak Grad:      12.5 mmHg AV Mean Grad:      6.5 mmHg LVOT Vmax:         97.15 cm/s LVOT Vmean:        61.250 cm/s LVOT VTI:          0.188 m LVOT/AV VTI ratio: 0.56 AI PHT:            721 msec  AORTA Ao Asc diam: 3.80 cm MITRAL VALVE                TRICUSPID VALVE MV Area (PHT): 3.99 cm     TR Peak grad:   62.7 mmHg MV Decel Time: 190 msec     TR Vmax:        396.00 cm/s MV E velocity: 47.50 cm/s MV A velocity: 107.00 cm/s  SHUNTS MV E/A ratio:  0.44         Systemic VTI:  0.19 m                             Systemic Diam: 3.20 cm Mihai Croitoru MD Electronically signed by Sanda Klein MD Signature Date/Time: 02/16/2022/3:02:16 PM    Final      Assessment and Plan:   Acute on Chronic respiratory failure with hypoxia  Acute on chronic combined systolic and diastolic heart failure CAD - Echo this admission showed EF 30-35% (down from 45-50%), severe hypokinesis of the inferolateral wall and inferior wall, grade I diastolic dysifunction, mildly reduced RV function and moderately enlarged RV  - Per echo report, LV wall motion abnormalities are unchanged, but LV function is worsened.  - Most recent ischemic eval was a stress test in 2017, showed inferior wall and basilar segment of lateral infarction, no reversible ischemia - Patient has a history of CAD, with stents placed to unknown arteries in 2000 - CTA showed L main and 3v disease - Patient's SOB is likely multifactorial, with his history of COPD, recent COVID infection, concerns for pulmonary fibrosis,  heart failure.  Would benefit from RHC to better assess cardiac pressures, pulmonary artery pressure. Would also benefit from a LHC to reevaluate coronary anatomy  - Plan for Right and left heart cath tomorrow afternoon  - Continue losartan, transition to entresto tomorrow AM  - Starting aldactone 25 mg today  - Continue metoprolol 200 mg daily  - Plan  for addition of an SGLT2i prior to discharge  - Given 40 mg lasix IV x2. Currently net -415 mL since admission. Weight down 3 lbs.  - Creatinine stable at 1.06 - Consider transition to oral lasix, but there has not been an increase in creatinine so could probably get a little more fluid off first. Will defer to MD   Paroxysmal Atrial Fibrillation  - On amiodarone, toprol xl  - Anticoagulated with Xarelto  - Per telemetry, heart rate is well controlled.  -Will be off xarelto and started on heparin in anticipation of cardiac procedures   CKD stage IIIa - Creatinine stable at 1.06 (baseline around 1.1)  - Continue to monitor  Otherwise managed per primary  - COPD - Pulmonary nodues  - Hypothyroidism  - CML - Abdominal Aortic Aneurysm   Risk Assessment/Risk Scores:    New York Heart Association (NYHA) Functional Class NYHA Class III  CHA2DS2-VASc Score = 3  This indicates a 3.2% annual risk of stroke. The patient's score is based upon: CHF History: 1 HTN History: 1 Diabetes History: 0 Stroke History: 0 Vascular Disease History: 0 Age Score: 1 Gender Score: 0      For questions or updates, please contact [place] Please consult [email or web address] for contact info under    Signed, Margie Billet, PA-C  02/17/2022 12:25 PM

## 2022-02-17 NOTE — Progress Notes (Signed)
ANTICOAGULATION CONSULT NOTE - Initial Consult  Pharmacy Consult for heparin Indication: atrial fibrillation  No Known Allergies  Patient Measurements: Height: 5\' 8"  (172.7 cm) Weight: 74.8 kg (164 lb 14.4 oz) (scale a) IBW/kg (Calculated) : 68.4   Vital Signs: Temp: 97.2 F (36.2 C) (02/22 0728) Temp Source: Oral (02/22 0728) BP: 142/77 (02/22 0728) Pulse Rate: 69 (02/22 0728)  Labs: Recent Labs    02/15/22 0917 02/15/22 0920 02/15/22 0925 02/15/22 1119 02/16/22 0102 02/17/22 0350  HGB 14.8  --  15.6  --  12.4* 12.1*  HCT 44.6  --  46.0  --  37.8* 36.9*  PLT 253  --   --   --  201 215  CREATININE 1.12  --   --   --  1.12 1.06  TROPONINIHS  --  26*  --  29*  --   --     Estimated Creatinine Clearance: 60 mL/min (by C-G formula based on SCr of 1.06 mg/dL).   Medical History: Past Medical History:  Diagnosis Date   Anemia 05/08/2015   CML (chronic myelocytic leukemia) ([place]) 12/16/2011   Coronary heart disease    CVA (cerebrovascular accident) ([place])    GERD (gastroesophageal reflux disease)    Grief reaction 03/18/2014   H/O tobacco use, presenting hazards to health 02/24/2012   No cigarettes since pacemaker placement. Encouraged ongoing efforts    History of chicken pox    History of kidney stones    Hyperlipidemia    Hypertension    IBS (irritable bowel syndrome) 09/20/2013   Medicare annual wellness visit, subsequent 03/23/2014   PAF (paroxysmal atrial fibrillation) ([facility])    Symptomatic bradycardia    a. symptomatic bradycardia due to sinus node dysfunction s/p RA and RV [employer] pacemaker 04/15/16    Medications:  Facility-Administered Medications Prior to Admission  Medication Dose Route Frequency Provider Last Rate Last Admin   cyanocobalamin ((VITAMIN B-12)) injection 1,000 mcg  1,000 mcg Intramuscular Q30 days Mosie Lukes, MD   1,000 mcg at 07/15/20 0454   Medications Prior to Admission  Medication Sig Dispense Refill Last Dose   albuterol  (VENTOLIN HFA) 108 (90 Base) MCG/ACT inhaler Inhale 2 puffs into the lungs every 6 (six) hours as needed. (Patient taking differently: Inhale 2 puffs into the lungs every 6 (six) hours as needed for shortness of breath.) 18 g 0 unknown   amiodarone (PACERONE) 200 MG tablet Take 1/2 (one-half) tablet by mouth once daily (Patient taking differently: Take 100 mg by mouth daily.) 45 tablet 3 02/14/2022   amLODipine (NORVASC) 5 MG tablet Take 1 tablet by mouth once daily 90 tablet 0 02/14/2022   atorvastatin (LIPITOR) 40 MG tablet Take 1 tablet (40 mg total) by mouth daily. 90 tablet 3 02/14/2022   ferrous sulfate 325 (65 FE) MG EC tablet Take 1 tablet (325 mg total) by mouth 2 (two) times daily. (Patient taking differently: Take 325 mg by mouth daily.) 60 tablet 11 02/14/2022   fluticasone (FLONASE) 50 MCG/ACT nasal spray Place 2 sprays into both nostrils daily. 48 g 3 02/14/2022   imatinib ([name]) 400 MG tablet TAKE 1 TABLET (400MG  TOTAL) BY MOUTH DAILY. TAKE WITH A MEAL AND LARGE GLASS OF WATER. (Patient taking differently: Take 400 mg by mouth daily.) 30 tablet 6 02/14/2022   levothyroxine (SYNTHROID) 25 MCG tablet Take 1 tablet (25 mcg total) by mouth daily before breakfast. 30 tablet 2 02/14/2022   losartan (COZAAR) 100 MG tablet Take 1 tablet (100 mg total) by  mouth daily. 90 tablet 1 02/14/2022   magnesium oxide (MAG-OX) 400 MG tablet Take 1 tablet (400 mg total) by mouth daily. Except Tuesday and Saturday take it BID. (Patient taking differently: Take 400-800 mg by mouth See admin instructions. Take one tablet by mouth every day except on Tuesdays and Saturdays take two tablets by mouth per patient) 30 tablet 2 02/14/2022   meclizine (ANTIVERT) 25 MG tablet TAKE 1 TABLET THREE TIMES DAILY (Patient taking differently: Take 25 mg by mouth 3 (three) times daily as needed for dizziness.) 90 tablet 0 Past Week   metoprolol succinate (TOPROL-XL) 200 MG 24 hr tablet Take 1 tablet (200 mg total) by mouth daily. Take  with or immediately following a meal. 90 tablet 3 02/14/2022 at 0900   nitroGLYCERIN (NITROSTAT) 0.4 MG SL tablet Place 1 tablet (0.4 mg total) under the tongue every 5 (five) minutes as needed for chest pain. 25 tablet 1 unknown   ondansetron (ZOFRAN) 4 MG tablet Take 1 tablet (4 mg total) by mouth 2 (two) times daily as needed for nausea or vomiting. 180 tablet 1 Past Week   rivaroxaban (XARELTO) 20 MG TABS tablet TAKE 1 TABLET BY MOUTH ONCE DAILY WITH SUPPER (Patient taking differently: 20 mg daily.) 90 tablet 1 02/15/2022 at 0900   Saccharomyces boulardii (PROBIOTIC) 250 MG CAPS Take 250 mg by mouth daily. 90 capsule 1 02/14/2022   vitamin B-12 (CYANOCOBALAMIN) 500 MCG tablet Take 500 mcg by mouth daily.   02/14/2022   omeprazole (PRILOSEC) 40 MG capsule Take 1 capsule (40 mg total) by mouth daily. 90 capsule 2 unknown   Scheduled:   amiodarone  100 mg Oral Daily   aspirin EC  81 mg Oral Daily   atorvastatin  40 mg Oral Daily   docusate sodium  100 mg Oral BID   feeding supplement  237 mL Oral BID BM   fluticasone  2 spray Each Nare Daily   furosemide  40 mg Intravenous Daily   levothyroxine  25 mcg Oral QAC breakfast   magnesium oxide  400 mg Oral 2 times per day on Tue Sat   magnesium oxide  400 mg Oral Once per day on Sun Mon Wed Thu Fri   metoprolol  200 mg Oral Daily   multivitamin with minerals  1 tablet Oral Daily   pantoprazole  40 mg Oral Daily   predniSONE  40 mg Oral Q breakfast   [START ON 02/18/2022] sacubitril-valsartan  1 tablet Oral BID   sodium chloride flush  3 mL Intravenous Q12H   sodium chloride flush  3 mL Intravenous Q12H   spironolactone  25 mg Oral Daily    Assessment: 76 yo male with afib and EF 30-35%.  He is on Xarelto (last dose 2/21 at 6pm) and plans are for heart cath. Pharmacy consulted to dose heparin.  -hg= 12.1  Goal of Therapy:  Heparin level 0.3-0.7 units/ml aPTT 66-102 seconds Monitor platelets by anticoagulation protocol: Yes   Plan:  -No  heparin bolus -Start heparin 1050 units/hr at 6pm -heparin level, aPTT and CBC in am  Hildred Laser, PharmD Clinical Pharmacist **Pharmacist phone directory can now be found on [email or web address] ([email or web address]).  Listed under [place].  e

## 2022-02-18 ENCOUNTER — Encounter (HOSPITAL_COMMUNITY)
Admission: EM | Disposition: A | Discharge status: Home Health | Payer: Self-pay | Source: Home / Self Care | Attending: Internal Medicine

## 2022-02-18 ENCOUNTER — Other Ambulatory Visit (HOSPITAL_COMMUNITY): Payer: Self-pay

## 2022-02-18 DIAGNOSIS — I480 Paroxysmal atrial fibrillation: Secondary | ICD-10-CM

## 2022-02-18 HISTORY — PX: RIGHT/LEFT HEART CATH AND CORONARY ANGIOGRAPHY: CATH118266

## 2022-02-18 LAB — POCT I-STAT 7, (LYTES, BLD GAS, ICA,H+H)
Acid-Base Excess: 2 mmol/L (ref 0.0–2.0)
Acid-Base Excess: 3 mmol/L — ABNORMAL HIGH (ref 0.0–2.0)
Bicarbonate: 26.1 mmol/L (ref 20.0–28.0)
Bicarbonate: 26.7 mmol/L (ref 20.0–28.0)
Calcium, Ion: 1.15 mmol/L (ref 1.15–1.40)
Calcium, Ion: 1.16 mmol/L (ref 1.15–1.40)
HCT: 40 % (ref 39.0–52.0)
HCT: 41 % (ref 39.0–52.0)
Hemoglobin: 13.6 g/dL (ref 13.0–17.0)
Hemoglobin: 13.9 g/dL (ref 13.0–17.0)
O2 Saturation: 97 %
O2 Saturation: 97 %
Potassium: 3.3 mmol/L — ABNORMAL LOW (ref 3.5–5.1)
Potassium: 3.3 mmol/L — ABNORMAL LOW (ref 3.5–5.1)
Sodium: 141 mmol/L (ref 135–145)
Sodium: 142 mmol/L (ref 135–145)
TCO2: 27 mmol/L (ref 22–32)
TCO2: 28 mmol/L (ref 22–32)
pCO2 arterial: 38.6 mmHg (ref 32–48)
pCO2 arterial: 38.9 mmHg (ref 32–48)
pH, Arterial: 7.438 (ref 7.35–7.45)
pH, Arterial: 7.446 (ref 7.35–7.45)
pO2, Arterial: 88 mmHg (ref 83–108)
pO2, Arterial: 91 mmHg (ref 83–108)

## 2022-02-18 LAB — POCT I-STAT EG7
Acid-Base Excess: 3 mmol/L — ABNORMAL HIGH (ref 0.0–2.0)
Acid-Base Excess: 4 mmol/L — ABNORMAL HIGH (ref 0.0–2.0)
Bicarbonate: 28.2 mmol/L — ABNORMAL HIGH (ref 20.0–28.0)
Bicarbonate: 28.9 mmol/L — ABNORMAL HIGH (ref 20.0–28.0)
Calcium, Ion: 1.27 mmol/L (ref 1.15–1.40)
Calcium, Ion: 1.27 mmol/L (ref 1.15–1.40)
HCT: 42 % (ref 39.0–52.0)
HCT: 43 % (ref 39.0–52.0)
Hemoglobin: 14.3 g/dL (ref 13.0–17.0)
Hemoglobin: 14.6 g/dL (ref 13.0–17.0)
O2 Saturation: 64 %
O2 Saturation: 69 %
Potassium: 3.5 mmol/L (ref 3.5–5.1)
Potassium: 3.5 mmol/L (ref 3.5–5.1)
Sodium: 140 mmol/L (ref 135–145)
Sodium: 141 mmol/L (ref 135–145)
TCO2: 30 mmol/L (ref 22–32)
TCO2: 30 mmol/L (ref 22–32)
pCO2, Ven: 44.2 mmHg (ref 44–60)
pCO2, Ven: 45.1 mmHg (ref 44–60)
pH, Ven: 7.413 (ref 7.25–7.43)
pH, Ven: 7.414 (ref 7.25–7.43)
pO2, Ven: 33 mmHg (ref 32–45)
pO2, Ven: 36 mmHg (ref 32–45)

## 2022-02-18 LAB — CBC
HCT: 41.3 % (ref 39.0–52.0)
Hemoglobin: 13.2 g/dL (ref 13.0–17.0)
MCH: 30 pg (ref 26.0–34.0)
MCHC: 32 g/dL (ref 30.0–36.0)
MCV: 93.9 fL (ref 80.0–100.0)
Platelets: 275 10*3/uL (ref 150–400)
RBC: 4.4 MIL/uL (ref 4.22–5.81)
RDW: 16 % — ABNORMAL HIGH (ref 11.5–15.5)
WBC: 15.2 10*3/uL — ABNORMAL HIGH (ref 4.0–10.5)
nRBC: 0 % (ref 0.0–0.2)

## 2022-02-18 LAB — BASIC METABOLIC PANEL
Anion gap: 9 (ref 5–15)
BUN: 29 mg/dL — ABNORMAL HIGH (ref 8–23)
CO2: 27 mmol/L (ref 22–32)
Calcium: 9.4 mg/dL (ref 8.9–10.3)
Chloride: 101 mmol/L (ref 98–111)
Creatinine, Ser: 1.52 mg/dL — ABNORMAL HIGH (ref 0.61–1.24)
GFR, Estimated: 48 mL/min — ABNORMAL LOW (ref >60)
Glucose, Bld: 101 mg/dL — ABNORMAL HIGH (ref 70–99)
Potassium: 4.5 mmol/L (ref 3.5–5.1)
Sodium: 137 mmol/L (ref 135–145)

## 2022-02-18 LAB — HEPARIN LEVEL (UNFRACTIONATED): Heparin Unfractionated: >1.1 IU/mL — ABNORMAL HIGH (ref 0.30–0.70)

## 2022-02-18 LAB — APTT: aPTT: 52 seconds — ABNORMAL HIGH (ref 24–36)

## 2022-02-18 SURGERY — RIGHT/LEFT HEART CATH AND CORONARY ANGIOGRAPHY
Anesthesia: LOCAL

## 2022-02-18 MED ORDER — VERAPAMIL HCL 2.5 MG/ML IV SOLN
INTRAVENOUS | Status: AC
  Filled 2022-02-18: qty 2

## 2022-02-18 MED ORDER — SACUBITRIL-VALSARTAN 49-51 MG PO TABS: 1.0000 | ORAL_TABLET | Freq: Two times a day (BID) | ORAL | Status: DC

## 2022-02-18 MED ORDER — LIDOCAINE HCL (PF) 1 % IJ SOLN
INTRAMUSCULAR | Status: AC
Start: 2022-02-18 — End: ?
  Filled 2022-02-18: qty 30

## 2022-02-18 MED ORDER — SODIUM CHLORIDE 0.9 % IV SOLN: 250.0000 mL | INTRAVENOUS | Status: DC | PRN

## 2022-02-18 MED ORDER — IOHEXOL 350 MG/ML SOLN
INTRAVENOUS | Status: DC | PRN
  Administered 2022-02-18: 45 mL

## 2022-02-18 MED ORDER — HEPARIN (PORCINE) IN NACL 1000-0.9 UT/500ML-% IV SOLN
INTRAVENOUS | Status: AC
  Filled 2022-02-18: qty 500

## 2022-02-18 MED ORDER — FENTANYL CITRATE (PF) 100 MCG/2ML IJ SOLN
INTRAMUSCULAR | Status: DC | PRN
  Administered 2022-02-18: 25 ug via INTRAVENOUS

## 2022-02-18 MED ORDER — RIVAROXABAN 20 MG PO TABS: 20.0000 mg | ORAL_TABLET | Freq: Every day | ORAL | Status: DC

## 2022-02-18 MED ORDER — SODIUM CHLORIDE 0.9% FLUSH
3.0000 mL | INTRAVENOUS | Status: DC | PRN
Start: 2022-02-19 — End: 2022-02-19

## 2022-02-18 MED ORDER — FENTANYL CITRATE (PF) 100 MCG/2ML IJ SOLN
INTRAMUSCULAR | Status: AC
  Filled 2022-02-18: qty 2

## 2022-02-18 MED ORDER — VERAPAMIL HCL 2.5 MG/ML IV SOLN
INTRAVENOUS | Status: DC | PRN
  Administered 2022-02-18: 10 mL via INTRA_ARTERIAL

## 2022-02-18 MED ORDER — HEPARIN (PORCINE) IN NACL 1000-0.9 UT/500ML-% IV SOLN
INTRAVENOUS | Status: DC | PRN
  Administered 2022-02-18 (×2): 500 mL

## 2022-02-18 MED ORDER — SODIUM CHLORIDE 0.9% FLUSH: 3.0000 mL | Freq: Two times a day (BID) | INTRAVENOUS | Status: DC

## 2022-02-18 MED ORDER — MIDAZOLAM HCL 2 MG/2ML IJ SOLN
INTRAMUSCULAR | Status: AC
  Filled 2022-02-18: qty 2

## 2022-02-18 MED ORDER — MIDAZOLAM HCL 2 MG/2ML IJ SOLN
INTRAMUSCULAR | Status: DC | PRN
  Administered 2022-02-18: 1 mg via INTRAVENOUS

## 2022-02-18 MED ORDER — LIDOCAINE HCL (PF) 1 % IJ SOLN
INTRAMUSCULAR | Status: DC | PRN
  Administered 2022-02-18: 2 mL

## 2022-02-18 MED ORDER — AMIODARONE HCL 100 MG PO TABS: 100.0000 mg | ORAL_TABLET | Freq: Every day | ORAL | Status: DC

## 2022-02-18 MED ORDER — HEPARIN SODIUM (PORCINE) 1000 UNIT/ML IJ SOLN
INTRAMUSCULAR | Status: AC
  Filled 2022-02-18: qty 10

## 2022-02-18 MED ORDER — SODIUM CHLORIDE 0.9 % WEIGHT BASED INFUSION
1.0000 mL/kg/h | INTRAVENOUS | Status: AC
  Administered 2022-02-18: 1 mL/kg/h via INTRAVENOUS

## 2022-02-18 MED ORDER — AMIODARONE HCL 100 MG PO TABS
100.0000 mg | ORAL_TABLET | Freq: Once | ORAL | Status: AC
  Administered 2022-02-18: 100 mg via ORAL
  Filled 2022-02-18: qty 1

## 2022-02-18 SURGICAL SUPPLY — 11 items

## 2022-02-18 NOTE — Progress Notes (Signed)
Mobility Specialist Progress Note:   02/18/22 1150  Mobility  Activity Ambulated with assistance in hallway  Level of Assistance Standby assist, set-up cues, supervision of patient - no hands on  Assistive Device None  Distance Ambulated (ft) 450 ft  Activity Response Tolerated well  $Mobility charge 1 Mobility   Session performed on 4LO2. Pt eager for OOB mobility. Asx during ambulation, pt back sitting EOB with all needs met.   [facility] Numbers Acute Rehab Phone: [phone] Office Phone: [phone]

## 2022-02-18 NOTE — Progress Notes (Signed)
Bursts of pacer spikes noted on central telemetry; no clear pattern or time intervals discerned by RN. Obtained 12 lead EKG without successfully capturing phenomenon. Patient denies distress, resting comfortably in bed only complaining about "finally getting to sleep when you came in".

## 2022-02-18 NOTE — Progress Notes (Signed)
PT Cancellation Note  Patient Details Name: ATLEE KLUTH MRN: [id number] DOB: Dec 25, 1946   Cancelled Treatment:    Reason Eval/Treat Not Completed: Patient at procedure or test/unavailable pt off unit for R and L heart cath this afternoon, will continue to follow and treat as time/schedule allows.   West Carbo, PT, DPT   Acute Rehabilitation Department Pager #: [phone]   Sandra Cockayne 02/18/2022, 3:58 PM

## 2022-02-18 NOTE — H&P (View-Only) (Signed)
Progress Note  Patient Name: Jason [name] Date of Encounter: 74/23/2023  [facility] Cardiologist: None NEW  Subjective   Denies any chest pain or SOB. Plan for cath today  Inpatient Medications    Scheduled Meds:  amiodarone  100 mg Oral Daily   aspirin EC  81 mg Oral Daily   atorvastatin  40 mg Oral Daily   docusate sodium  100 mg Oral BID   feeding supplement  237 mL Oral BID BM   fluticasone  2 spray Each Nare Daily   furosemide  40 mg Intravenous Daily   levothyroxine  25 mcg Oral QAC breakfast   magnesium oxide  400 mg Oral 2 times per day on Tue Sat   magnesium oxide  400 mg Oral Once per day on Sun Mon Wed Thu Fri   metoprolol  200 mg Oral Daily   multivitamin with minerals  1 tablet Oral Daily   pantoprazole  40 mg Oral Daily   predniSONE  40 mg Oral Q breakfast   [START ON 02/19/2022] sacubitril-valsartan  1 tablet Oral BID   sodium chloride flush  3 mL Intravenous Q12H   sodium chloride flush  3 mL Intravenous Q12H   spironolactone  25 mg Oral Daily   Continuous Infusions:  sodium chloride     sodium chloride 10 mL/hr at 02/18/22 2703   heparin 1,200 Units/hr (02/18/22 0905)   PRN Meds: sodium chloride, acetaminophen **OR** acetaminophen, albuterol, bisacodyl, guaiFENesin, hydrALAZINE, ipratropium-albuterol, ondansetron **OR** ondansetron (ZOFRAN) IV, oxyCODONE, polyethylene glycol, sodium chloride flush   Vital Signs    Vitals:   02/18/22 0350 02/18/22 0729 02/18/22 0919 02/18/22 1117  BP: 106/87 129/69  (!) 147/77  Pulse: 99 62 62 61  Resp: 18 18  18   Temp: (!) 97.3 F (36.3 C) (!) 97.3 F (36.3 C) (!) 97.4 F (36.3 C) (!) 97.3 F (36.3 C)  TempSrc: Oral Oral Oral Oral  SpO2: 92% 93%  98%  Weight: 73.8 kg     Height:        Intake/Output Summary (Last 24 hours) at 02/18/2022 1205 Last data filed at 02/18/2022 1117 Gross per 24 hour  Intake 480 ml  Output 1925 ml  Net -1445 ml   Last 3 Weights 02/18/2022 02/17/2022 02/16/2022  Weight  (lbs) 162 lb 11.2 oz 164 lb 14.4 oz 167 lb 15.9 oz  Weight (kg) 73.8 kg 74.798 kg 76.2 kg      Telemetry    Atrial paced with frequent PACs and some rapid V pacing - Personally Reviewed  ECG    Atrial fibrillation with RVR at 106bpm and RBBB/LAFB - Personally Reviewed  Physical Exam   GEN: No acute distress.   Neck: No JVD Cardiac: irregularly irregular and tachy, no murmurs, rubs, or gallops.  Respiratory: Clear to auscultation bilaterally. GI: Soft, nontender, non-distended  MS: No edema; No deformity. Neuro:  Nonfocal  Psych: Normal affect   Labs    High Sensitivity Troponin:   Recent Labs  Lab 02/15/22 0920 02/15/22 1119  TROPONINIHS 26* 29*      Chemistry Recent Labs  Lab 02/15/22 0917 02/15/22 0925 02/16/22 0102 02/17/22 0350 02/18/22 0439  NA 139   < > 139 137 137  K 3.1*   < > 4.2 4.6 4.5  CL 109  --  108 105 101  CO2 20*  --  24 22 27   GLUCOSE 101*  --  179* 104* 101*  BUN 7*  --  9 18 29*  CREATININE 1.12  --  1.12 1.06 1.52*  CALCIUM 8.6*  --  8.6* 9.2 9.4  PROT 6.9  --   --   --   --   ALBUMIN 3.5  --   --   --   --   AST 21  --   --   --   --   ALT 11  --   --   --   --   ALKPHOS 73  --   --   --   --   BILITOT 1.1  --   --   --   --   GFRNONAA >60  --  >60 >60 48*  ANIONGAP 10  --  7 10 9    < > = values in this interval not displayed.     Hematology Recent Labs  Lab 02/16/22 0102 02/17/22 0350 02/18/22 0439  WBC 4.1 15.4* 15.2*  RBC 4.09* 3.94* 4.40  HGB 12.4* 12.1* 13.2  HCT 37.8* 36.9* 41.3  MCV 92.4 93.7 93.9  MCH 30.3 30.7 30.0  MCHC 32.8 32.8 32.0  RDW 15.8* 16.0* 16.0*  PLT 201 215 275    BNPNo results for input(s): BNP, PROBNP in the last 168 hours.   DDimer  Recent Labs  Lab 02/15/22 [phone]  DDIMER 2.03*     CHA2DS2-VASc Score = 3  This indicates a 3.2% annual risk of stroke. The patient's score is based upon: CHF History: 1 HTN History: 1 Diabetes History: 0 Stroke History: 0 Vascular Disease History:  0 Age Score: 1 Gender Score: 0   Radiology    DG Swallowing Func-Speech Pathology  Result Date: 02/17/2022 Table formatting from the original result was not included. Objective Swallowing Evaluation: Type of Study: MBS-Modified Barium Swallow Study Completed and documented by Vaughan Sine, SLP Student Supervised and reviewed by Herbie Baltimore MA CCC-SLP Patient Details Name: SARAH ZERBY MRN: [id number] Date of Birth: 09-24-1946 Today's Date: 02/17/2022 Time: SLP Start Time (ACUTE ONLY): 12 -SLP Stop Time (ACUTE ONLY): 2836 SLP Time Calculation (min) (ACUTE ONLY): 14 min Past Medical History: Past Medical History: Diagnosis Date  Anemia 05/08/2015  CML (chronic myelocytic leukemia) ([place]) 12/16/2011  Coronary heart disease   CVA (cerebrovascular accident) ([place])   GERD (gastroesophageal reflux disease)   Grief reaction 03/18/2014  H/O tobacco use, presenting hazards to health 02/24/2012  No cigarettes since pacemaker placement. Encouraged ongoing efforts   History of chicken pox   History of kidney stones   Hyperlipidemia   Hypertension   IBS (irritable bowel syndrome) 09/20/2013  Medicare annual wellness visit, subsequent 03/23/2014  PAF (paroxysmal atrial fibrillation) ([facility])   Symptomatic bradycardia   a. symptomatic bradycardia due to sinus node dysfunction s/p RA and RV Medtronic pacemaker 04/15/16 Past Surgical History: Past Surgical History: Procedure Laterality Date  CATARACT EXTRACTION    CHOLECYSTECTOMY  2003  CORONARY ANGIOPLASTY WITH STENT PLACEMENT  2000  EP IMPLANTABLE DEVICE N/A 04/15/2016  Procedure: Pacemaker Implant;  Surgeon: Evans Lance, MD; Medtronic (serial number [id number]) pacemaker; Laterality: Left No data recorded No data recorded  Recommendations for follow up therapy are one component of a multi-disciplinary discharge planning process, led by the attending physician.  Recommendations may be updated based on patient status, additional functional criteria and insurance authorization.  Assessment / Plan / Recommendation Clinical Impressions 02/17/2022 Clinical Impression Patient was seen for a MBS d/t concerns of chronic silent aspiration. Oral mech exam WFL with adequate labial and lingual ROM and strength observed. MBS revealed that there  are no clinical indications of aspiration, penetration or oropharyngeal dysphagia. Mastication, lateralization, and bolus preparation/propulsion WFL across all consistencies/textures. Epiglottic inversion and laryngeal vestibule closure present. Noted some residue at the level of the pyriform sinuses; pt able to clear residue independently. Neither penetration or aspiration was observed in MBS. Noted that pill with thin was effortful to clear; resolved when followed with sips of thin liquids. SLP recommends patient continue regular/thin diet; Patient educated on taking medications with cup of water to ensure they are passing effectively. SLP Visit Diagnosis Dysphagia, unspecified (R13.10) Attention and concentration deficit following -- Frontal lobe and executive function deficit following -- Impact on safety and function No limitations   Treatment Recommendations 02/17/2022 Treatment Recommendations No treatment recommended at this time   Prognosis 02/17/2022 Prognosis for Safe Diet Advancement Good Barriers to Reach Goals -- Barriers/Prognosis Comment -- Diet Recommendations 02/17/2022 SLP Diet Recommendations Regular solids;Thin liquid Liquid Administration via Cup;Straw Medication Administration Whole meds with liquid Compensations -- Postural Changes Remain semi-upright after after feeds/meals (Comment);Seated upright at 90 degrees   Other Recommendations 02/17/2022 Recommended Consults -- Oral Care Recommendations Oral care BID Other Recommendations -- Follow Up Recommendations No SLP follow up Assistance recommended at discharge None Functional Status Assessment Patient has had a recent decline in their functional status and demonstrates the ability to make  significant improvements in function in a reasonable and predictable amount of time. No flowsheet data found.  Oral Phase 02/17/2022 Oral Phase WFL Oral - Pudding Teaspoon -- Oral - Pudding Cup -- Oral - Honey Teaspoon -- Oral - Honey Cup -- Oral - Nectar Teaspoon -- Oral - Nectar Cup -- Oral - Nectar Straw -- Oral - Thin Teaspoon -- Oral - Thin Cup -- Oral - Thin Straw -- Oral - Puree -- Oral - Mech Soft -- Oral - Regular -- Oral - Multi-Consistency -- Oral - Pill -- Oral Phase - Comment --  Pharyngeal Phase 02/17/2022 Pharyngeal Phase WFL Pharyngeal- Pudding Teaspoon -- Pharyngeal -- Pharyngeal- Pudding Cup -- Pharyngeal -- Pharyngeal- Honey Teaspoon -- Pharyngeal -- Pharyngeal- Honey Cup -- Pharyngeal -- Pharyngeal- Nectar Teaspoon -- Pharyngeal -- Pharyngeal- Nectar Cup -- Pharyngeal -- Pharyngeal- Nectar Straw -- Pharyngeal -- Pharyngeal- Thin Teaspoon -- Pharyngeal -- Pharyngeal- Thin Cup -- Pharyngeal -- Pharyngeal- Thin Straw -- Pharyngeal -- Pharyngeal- Puree -- Pharyngeal -- Pharyngeal- Mechanical Soft -- Pharyngeal -- Pharyngeal- Regular -- Pharyngeal -- Pharyngeal- Multi-consistency -- Pharyngeal -- Pharyngeal- Pill -- Pharyngeal -- Pharyngeal Comment --  No flowsheet data found. DeBlois, Katherene Ponto 02/17/2022, 1:18 PM                     ECHOCARDIOGRAM COMPLETE  Result Date: 02/16/2022    ECHOCARDIOGRAM REPORT   Patient Name:   WALDON SHEERIN Date of Exam: 02/16/2022 Medical Rec #:  [id number]     Height:       68.0 in Accession #:    [id number]    Weight:       168.0 lb Date of Birth:  [birth year]     BSA:          1.898 m Patient Age:    76 years      BP:           131/76 mmHg Patient Gender: M             HR:           71 bpm. Exam Location:  Inpatient Procedure: 2D Echo, Cardiac Doppler, Color Doppler and  Strain Analysis Indications:    Dyspnea  History:        Patient has prior history of Echocardiogram examinations, most                 recent 09/16/2021. CAD, Stroke, Arrythmias:Atrial  Fibrillation                 and Bradycardia; Risk Factors:Hypertension. 04/15/16 pacemaker.  Sonographer:    Luisa Hart RDCS Referring Phys: [place]  1. Left ventricular ejection fraction, by estimation, is 30 to 35%. The left ventricle has moderately decreased function. The left ventricle demonstrates global hypokinesis. Left ventricular diastolic parameters are consistent with Grade I diastolic dysfunction (impaired relaxation). There is severe hypokinesis of the left ventricular, entire inferolateral wall and inferior wall.  2. Right ventricular systolic function is mildly reduced. The right ventricular size is moderately enlarged.  3. Left atrial size was mildly dilated.  4. Right atrial size was mildly dilated.  5. The mitral valve is normal in structure. No evidence of mitral valve regurgitation.  6. Tricuspid valve regurgitation is moderate.  7. The aortic valve is tricuspid. There is mild calcification of the aortic valve. There is mild thickening of the aortic valve. Aortic valve regurgitation is mild to moderate. Aortic valve sclerosis/calcification is present, without any evidence of aortic stenosis.  8. There is borderline dilatation of the ascending aorta, measuring 38 mm. Comparison(s): The left ventricular function is worsened. The left ventricular wall motion abnormalities are unchanged. FINDINGS  Left Ventricle: Left ventricular ejection fraction, by estimation, is 30 to 35%. The left ventricle has moderately decreased function. The left ventricle demonstrates global hypokinesis. Severe hypokinesis of the left ventricular, entire inferolateral wall and inferior wall. The left ventricular internal cavity size was normal in size. There is no left ventricular hypertrophy. Left ventricular diastolic parameters are consistent with Grade I diastolic dysfunction (impaired relaxation). Indeterminate filling pressures. Right Ventricle: The right ventricular size is moderately  enlarged. No increase in right ventricular wall thickness. Right ventricular systolic function is mildly reduced. Left Atrium: Left atrial size was mildly dilated. Right Atrium: Right atrial size was mildly dilated. Prominent Eustachian valve. Pericardium: There is no evidence of pericardial effusion. Mitral Valve: The mitral valve is normal in structure. No evidence of mitral valve regurgitation. Tricuspid Valve: The tricuspid valve is normal in structure. Tricuspid valve regurgitation is moderate. Aortic Valve: The aortic valve is tricuspid. There is mild calcification of the aortic valve. There is mild thickening of the aortic valve. Aortic valve regurgitation is mild to moderate. Aortic regurgitation PHT measures 721 msec. Aortic valve sclerosis/calcification is present, without any evidence of aortic stenosis. Aortic valve mean gradient measures 6.5 mmHg. Aortic valve peak gradient measures 12.5 mmHg. Aortic valve area, by VTI measures 4.54 cm. Pulmonic Valve: The pulmonic valve was normal in structure. Pulmonic valve regurgitation is not visualized. Aorta: The aortic root is normal in size and structure. There is borderline dilatation of the ascending aorta, measuring 38 mm. IAS/Shunts: No atrial level shunt detected by color flow Doppler. Additional Comments: A device lead is visualized in the right ventricle.  LEFT VENTRICLE PLAX 2D LVOT diam:     3.20 cm      Diastology LV SV:         151          LV e' medial:    4.13 cm/s LV SV Index:   79           LV E/e'  medial:  11.5 LVOT Area:     8.04 cm     LV e' lateral:   2.94 cm/s                             LV E/e' lateral: 16.2  LV Volumes (MOD)            2D Longitudinal Strain LV vol d, MOD A2C: 130.0 ml 2D Strain GLS Avg:     -12.2 % LV vol d, MOD A4C: 149.0 ml LV vol s, MOD A2C: 60.8 ml LV vol s, MOD A4C: 95.5 ml LV SV MOD A2C:     69.2 ml LV SV MOD A4C:     149.0 ml LV SV MOD BP:      63.4 ml RIGHT VENTRICLE RV Basal diam:  5.00 cm RV Mid diam:    3.10  cm RV S prime:     11.70 cm/s TAPSE (M-mode): 1.8 cm LEFT ATRIUM           Index        RIGHT ATRIUM           Index LA diam:      3.30 cm 1.74 cm/m   RA Area:     25.00 cm LA Vol (A2C): 46.2 ml 24.34 ml/m  RA Volume:   95.80 ml  50.48 ml/m LA Vol (A4C): 55.8 ml 29.40 ml/m  AORTIC VALVE                     PULMONIC VALVE AV Area (Vmax):    4.43 cm      PV Vmax:       0.73 m/s AV Area (Vmean):   4.21 cm      PV Vmean:      49.200 cm/s AV Area (VTI):     4.54 cm      PV VTI:        0.176 m AV Vmax:           176.50 cm/s   PV Peak grad:  2.1 mmHg AV Vmean:          117.000 cm/s  PV Mean grad:  1.0 mmHg AV VTI:            0.332 m AV Peak Grad:      12.5 mmHg AV Mean Grad:      6.5 mmHg LVOT Vmax:         97.15 cm/s LVOT Vmean:        61.250 cm/s LVOT VTI:          0.188 m LVOT/AV VTI ratio: 0.56 AI PHT:            721 msec  AORTA Ao Asc diam: 3.80 cm MITRAL VALVE                TRICUSPID VALVE MV Area (PHT): 3.99 cm     TR Peak grad:   62.7 mmHg MV Decel Time: 190 msec     TR Vmax:        396.00 cm/s MV E velocity: 47.50 cm/s MV A velocity: 107.00 cm/s  SHUNTS MV E/A ratio:  0.44         Systemic VTI:  0.19 m                             Systemic Diam: 3.20 cm Mihai  Croitoru MD Electronically signed by Sanda Klein MD Signature Date/Time: 02/16/2022/3:02:16 PM    Final     Cardiac Studies   2D echo 02/16/22 IMPRESSIONS    1. Left ventricular ejection fraction, by estimation, is 30 to 35%. The  left ventricle has moderately decreased function. The left ventricle  demonstrates global hypokinesis. Left ventricular diastolic parameters are  consistent with Grade I diastolic  dysfunction (impaired relaxation). There is severe hypokinesis of the left  ventricular, entire inferolateral wall and inferior wall.   2. Right ventricular systolic function is mildly reduced. The right  ventricular size is moderately enlarged.   3. Left atrial size was mildly dilated.   4. Right atrial size was mildly dilated.    5. The mitral valve is normal in structure. No evidence of mitral valve  regurgitation.   6. Tricuspid valve regurgitation is moderate.   7. The aortic valve is tricuspid. There is mild calcification of the  aortic valve. There is mild thickening of the aortic valve. Aortic valve  regurgitation is mild to moderate. Aortic valve sclerosis/calcification is  present, without any evidence of  aortic stenosis.   8. There is borderline dilatation of the ascending aorta, measuring 38  mm.   Comparison(s): The left ventricular function is worsened. The left  ventricular wall motion abnormalities are unchanged.   Patient Profile     76 y.o. male  with a hx of CAD, CML (2012), HTN, CVA, HLD, COPD on home O2, afib on xarelto, tachy-brady syndrome s/p medtronic PPM (2017)  who is being seen 02/17/2022 for the evaluation of reduced EF at the request of Dr. Broadus John.  Assessment & Plan    Acute on Chronic respiratory failure with hypoxia  Acute on chronic combined systolic and diastolic heart failure - Echo this admission showed EF 30-35% (down from 45-50%), severe hypokinesis of the inferolateral wall and inferior wall, grade I diastolic dysifunction, mildly reduced RV function and moderately enlarged RV  - Per echo report, LV wall motion abnormalities are unchanged, but LV function is worsened.  - Most recent ischemic eval was a stress test in 2017, showed inferior wall and basilar segment of lateral infarction, no reversible ischemia - Patient has a history of CAD, with stents placed to unknown arteries in 2000 - Chest CTA this admit showed L main and 3v disease - Patient's SOB is likely multifactorial, with his history of COPD, recent COVID infection, concerns for pulmonary fibrosis,  heart failure.   - Would benefit from Right and left heart cath to better assess cardiac pressures, pulmonary artery pressure and reevaluate coronary anatomy  - Xarelto on hold>>last dose PM of 2/21  - continue IV  Heparin per pharmacy - He put out 2.4L yesterday and is net neg 2.6L since admission - weight down 4lbs from admit - SCr bumped from 1.06 to 1.52 this am>>discussed with interventional Cards and will do cors only contrast to limit nephrotoxicity and no LVgram - hold ARB, diuretics and spiro - follow renal function closely - transition to entresto at discharge if renal function stable - continue Toprol XL 200mg  daily - Plan for addition of an SGLT2i prior to discharge    Persistent Atrial Fibrillation  - he appears to be going in and out of PAF on tele and some atrial tach - On Toprol XL 200mg  daily - will increase Amio to 200mg  daily to try to suppress PAF - he also had some rapid V pacing on tele (? Tracking at upper rate  when in atach)>>will get pacer interrogated - Anticoagulated with Xarelto >>on hold for cath - resume Xarelto post cath if no CABG needed   CKD stage IIIa - Creatinine bumped from 1.06 to 1.52 this am likely related to diuresis - holding ARB, diuretics and spiro - Continue to monitor   Otherwise managed per primary  - COPD - Pulmonary nodues  - Hypothyroidism  - CML - Abdominal Aortic Aneurysm   I have spent a total of 40 minutes with patient reviewing 2D echo , telemetry, EKGs, labs and examining patient as well as establishing an assessment and plan that was discussed with the patient.  > 50% of time was spent in direct patient care.        For questions or updates, please contact [place] Please consult [email or web address] for contact info under        Signed, Fransico Him, MD  02/18/2022, 12:05 PM

## 2022-02-18 NOTE — Progress Notes (Addendum)
Progress Note  Patient Name: Jason [name] Date of Encounter: 02/18/2022  [facility] Cardiologist: None NEW  Subjective   Denies any chest pain or SOB. Plan for cath today  Inpatient Medications    Scheduled Meds:  amiodarone  100 mg Oral Daily   aspirin EC  81 mg Oral Daily   atorvastatin  40 mg Oral Daily   docusate sodium  100 mg Oral BID   feeding supplement  237 mL Oral BID BM   fluticasone  2 spray Each Nare Daily   furosemide  40 mg Intravenous Daily   levothyroxine  25 mcg Oral QAC breakfast   magnesium oxide  400 mg Oral 2 times per day on Tue Sat   magnesium oxide  400 mg Oral Once per day on Sun Mon Wed Thu Fri   metoprolol  200 mg Oral Daily   multivitamin with minerals  1 tablet Oral Daily   pantoprazole  40 mg Oral Daily   predniSONE  40 mg Oral Q breakfast   [START ON 02/19/2022] sacubitril-valsartan  1 tablet Oral BID   sodium chloride flush  3 mL Intravenous Q12H   sodium chloride flush  3 mL Intravenous Q12H   spironolactone  25 mg Oral Daily   Continuous Infusions:  sodium chloride     sodium chloride 10 mL/hr at 02/18/22 3545   heparin 1,200 Units/hr (02/18/22 0905)   PRN Meds: sodium chloride, acetaminophen **OR** acetaminophen, albuterol, bisacodyl, guaiFENesin, hydrALAZINE, ipratropium-albuterol, ondansetron **OR** ondansetron (ZOFRAN) IV, oxyCODONE, polyethylene glycol, sodium chloride flush   Vital Signs    Vitals:   02/18/22 0350 02/18/22 0729 02/18/22 0919 02/18/22 1117  BP: 106/87 129/69  (!) 147/77  Pulse: 99 62 62 61  Resp: 18 18  18   Temp: (!) 97.3 F (36.3 C) (!) 97.3 F (36.3 C) (!) 97.4 F (36.3 C) (!) 97.3 F (36.3 C)  TempSrc: Oral Oral Oral Oral  SpO2: 92% 93%  98%  Weight: 73.8 kg     Height:        Intake/Output Summary (Last 24 hours) at 02/18/2022 1205 Last data filed at 02/18/2022 1117 Gross per 24 hour  Intake 480 ml  Output 1925 ml  Net -1445 ml   Last 3 Weights 02/18/2022 02/17/2022 02/16/2022  Weight  (lbs) 162 lb 11.2 oz 164 lb 14.4 oz 167 lb 15.9 oz  Weight (kg) 73.8 kg 74.798 kg 76.2 kg      Telemetry    Atrial paced with frequent PACs and some rapid V pacing - Personally Reviewed  ECG    Atrial fibrillation with RVR at 106bpm and RBBB/LAFB - Personally Reviewed  Physical Exam   GEN: No acute distress.   Neck: No JVD Cardiac: irregularly irregular and tachy, no murmurs, rubs, or gallops.  Respiratory: Clear to auscultation bilaterally. GI: Soft, nontender, non-distended  MS: No edema; No deformity. Neuro:  Nonfocal  Psych: Normal affect   Labs    High Sensitivity Troponin:   Recent Labs  Lab 02/15/22 0920 02/15/22 1119  TROPONINIHS 26* 29*      Chemistry Recent Labs  Lab 02/15/22 0917 02/15/22 0925 02/16/22 0102 02/17/22 0350 02/18/22 0439  NA 139   < > 139 137 137  K 3.1*   < > 4.2 4.6 4.5  CL 109  --  108 105 101  CO2 20*  --  24 22 27   GLUCOSE 101*  --  179* 104* 101*  BUN 7*  --  9 18 29*  CREATININE 1.12  --  1.12 1.06 1.52*  CALCIUM 8.6*  --  8.6* 9.2 9.4  PROT 6.9  --   --   --   --   ALBUMIN 3.5  --   --   --   --   AST 21  --   --   --   --   ALT 11  --   --   --   --   ALKPHOS 73  --   --   --   --   BILITOT 1.1  --   --   --   --   GFRNONAA >60  --  >60 >60 48*  ANIONGAP 10  --  7 10 9    < > = values in this interval not displayed.     Hematology Recent Labs  Lab 02/16/22 0102 02/17/22 0350 02/18/22 0439  WBC 4.1 15.4* 15.2*  RBC 4.09* 3.94* 4.40  HGB 12.4* 12.1* 13.2  HCT 37.8* 36.9* 41.3  MCV 92.4 93.7 93.9  MCH 30.3 30.7 30.0  MCHC 32.8 32.8 32.0  RDW 15.8* 16.0* 16.0*  PLT 201 215 275    BNPNo results for input(s): BNP, PROBNP in the last 168 hours.   DDimer  Recent Labs  Lab 02/15/22 [phone]  DDIMER 2.03*     CHA2DS2-VASc Score = 3  This indicates a 3.2% annual risk of stroke. The patient's score is based upon: CHF History: 1 HTN History: 1 Diabetes History: 0 Stroke History: 0 Vascular Disease History:  0 Age Score: 1 Gender Score: 0   Radiology    DG Swallowing Func-Speech Pathology  Result Date: 02/17/2022 Table formatting from the original result was not included. Objective Swallowing Evaluation: Type of Study: MBS-Modified Barium Swallow Study Completed and documented by Vaughan Sine, SLP Student Supervised and reviewed by Herbie Baltimore MA CCC-SLP Patient Details Name: Jason [name] MRN: [id number] Date of Birth: 10-04-1946 Today's Date: 02/17/2022 Time: SLP Start Time (ACUTE ONLY): 23 -SLP Stop Time (ACUTE ONLY): 7782 SLP Time Calculation (min) (ACUTE ONLY): 14 min Past Medical History: Past Medical History: Diagnosis Date  Anemia 05/08/2015  CML (chronic myelocytic leukemia) ([place]) 12/16/2011  Coronary heart disease   CVA (cerebrovascular accident) ([place])   GERD (gastroesophageal reflux disease)   Grief reaction 03/18/2014  H/O tobacco use, presenting hazards to health 02/24/2012  No cigarettes since pacemaker placement. Encouraged ongoing efforts   History of chicken pox   History of kidney stones   Hyperlipidemia   Hypertension   IBS (irritable bowel syndrome) 09/20/2013  Medicare annual wellness visit, subsequent 03/23/2014  PAF (paroxysmal atrial fibrillation) (HCC)   Symptomatic bradycardia   a. symptomatic bradycardia due to sinus node dysfunction s/p RA and RV Medtronic pacemaker 04/15/16 Past Surgical History: Past Surgical History: Procedure Laterality Date  CATARACT EXTRACTION    CHOLECYSTECTOMY  2003  CORONARY ANGIOPLASTY WITH STENT PLACEMENT  2000  EP IMPLANTABLE DEVICE N/A 04/15/2016  Procedure: Pacemaker Implant;  Surgeon: Evans Lance, MD; Medtronic (serial number [id number]) pacemaker; Laterality: Left No data recorded No data recorded  Recommendations for follow up therapy are one component of a multi-disciplinary discharge planning process, led by the attending physician.  Recommendations may be updated based on patient status, additional functional criteria and insurance authorization.  Assessment / Plan / Recommendation Clinical Impressions 02/17/2022 Clinical Impression Patient was seen for a MBS d/t concerns of chronic silent aspiration. Oral mech exam WFL with adequate labial and lingual ROM and strength observed. MBS revealed that there  are no clinical indications of aspiration, penetration or oropharyngeal dysphagia. Mastication, lateralization, and bolus preparation/propulsion WFL across all consistencies/textures. Epiglottic inversion and laryngeal vestibule closure present. Noted some residue at the level of the pyriform sinuses; pt able to clear residue independently. Neither penetration or aspiration was observed in MBS. Noted that pill with thin was effortful to clear; resolved when followed with sips of thin liquids. SLP recommends patient continue regular/thin diet; Patient educated on taking medications with cup of water to ensure they are passing effectively. SLP Visit Diagnosis Dysphagia, unspecified (R13.10) Attention and concentration deficit following -- Frontal lobe and executive function deficit following -- Impact on safety and function No limitations   Treatment Recommendations 02/17/2022 Treatment Recommendations No treatment recommended at this time   Prognosis 02/17/2022 Prognosis for Safe Diet Advancement Good Barriers to Reach Goals -- Barriers/Prognosis Comment -- Diet Recommendations 02/17/2022 SLP Diet Recommendations Regular solids;Thin liquid Liquid Administration via Cup;Straw Medication Administration Whole meds with liquid Compensations -- Postural Changes Remain semi-upright after after feeds/meals (Comment);Seated upright at 90 degrees   Other Recommendations 02/17/2022 Recommended Consults -- Oral Care Recommendations Oral care BID Other Recommendations -- Follow Up Recommendations No SLP follow up Assistance recommended at discharge None Functional Status Assessment Patient has had a recent decline in their functional status and demonstrates the ability to make  significant improvements in function in a reasonable and predictable amount of time. No flowsheet data found.  Oral Phase 02/17/2022 Oral Phase WFL Oral - Pudding Teaspoon -- Oral - Pudding Cup -- Oral - Honey Teaspoon -- Oral - Honey Cup -- Oral - Nectar Teaspoon -- Oral - Nectar Cup -- Oral - Nectar Straw -- Oral - Thin Teaspoon -- Oral - Thin Cup -- Oral - Thin Straw -- Oral - Puree -- Oral - Mech Soft -- Oral - Regular -- Oral - Multi-Consistency -- Oral - Pill -- Oral Phase - Comment --  Pharyngeal Phase 02/17/2022 Pharyngeal Phase WFL Pharyngeal- Pudding Teaspoon -- Pharyngeal -- Pharyngeal- Pudding Cup -- Pharyngeal -- Pharyngeal- Honey Teaspoon -- Pharyngeal -- Pharyngeal- Honey Cup -- Pharyngeal -- Pharyngeal- Nectar Teaspoon -- Pharyngeal -- Pharyngeal- Nectar Cup -- Pharyngeal -- Pharyngeal- Nectar Straw -- Pharyngeal -- Pharyngeal- Thin Teaspoon -- Pharyngeal -- Pharyngeal- Thin Cup -- Pharyngeal -- Pharyngeal- Thin Straw -- Pharyngeal -- Pharyngeal- Puree -- Pharyngeal -- Pharyngeal- Mechanical Soft -- Pharyngeal -- Pharyngeal- Regular -- Pharyngeal -- Pharyngeal- Multi-consistency -- Pharyngeal -- Pharyngeal- Pill -- Pharyngeal -- Pharyngeal Comment --  No flowsheet data found. DeBlois, Katherene Ponto 02/17/2022, 1:18 PM                     ECHOCARDIOGRAM COMPLETE  Result Date: 02/16/2022    ECHOCARDIOGRAM REPORT   Patient Name:   Jason [name] Date of Exam: 02/16/2022 Medical Rec #:  [id number]     Height:       68.0 in Accession #:    [id number]    Weight:       168.0 lb Date of Birth:  12-09-1946     BSA:          1.898 m Patient Age:    76 years      BP:           131/76 mmHg Patient Gender: M             HR:           71 bpm. Exam Location:  Inpatient Procedure: 2D Echo, Cardiac Doppler, Color Doppler and  Strain Analysis Indications:    Dyspnea  History:        Patient has prior history of Echocardiogram examinations, most                 recent 09/16/2021. CAD, Stroke, Arrythmias:Atrial  Fibrillation                 and Bradycardia; Risk Factors:Hypertension. 04/15/16 pacemaker.  Sonographer:    Luisa Hart RDCS Referring Phys: [place]  1. Left ventricular ejection fraction, by estimation, is 30 to 35%. The left ventricle has moderately decreased function. The left ventricle demonstrates global hypokinesis. Left ventricular diastolic parameters are consistent with Grade I diastolic dysfunction (impaired relaxation). There is severe hypokinesis of the left ventricular, entire inferolateral wall and inferior wall.  2. Right ventricular systolic function is mildly reduced. The right ventricular size is moderately enlarged.  3. Left atrial size was mildly dilated.  4. Right atrial size was mildly dilated.  5. The mitral valve is normal in structure. No evidence of mitral valve regurgitation.  6. Tricuspid valve regurgitation is moderate.  7. The aortic valve is tricuspid. There is mild calcification of the aortic valve. There is mild thickening of the aortic valve. Aortic valve regurgitation is mild to moderate. Aortic valve sclerosis/calcification is present, without any evidence of aortic stenosis.  8. There is borderline dilatation of the ascending aorta, measuring 38 mm. Comparison(s): The left ventricular function is worsened. The left ventricular wall motion abnormalities are unchanged. FINDINGS  Left Ventricle: Left ventricular ejection fraction, by estimation, is 30 to 35%. The left ventricle has moderately decreased function. The left ventricle demonstrates global hypokinesis. Severe hypokinesis of the left ventricular, entire inferolateral wall and inferior wall. The left ventricular internal cavity size was normal in size. There is no left ventricular hypertrophy. Left ventricular diastolic parameters are consistent with Grade I diastolic dysfunction (impaired relaxation). Indeterminate filling pressures. Right Ventricle: The right ventricular size is moderately  enlarged. No increase in right ventricular wall thickness. Right ventricular systolic function is mildly reduced. Left Atrium: Left atrial size was mildly dilated. Right Atrium: Right atrial size was mildly dilated. Prominent Eustachian valve. Pericardium: There is no evidence of pericardial effusion. Mitral Valve: The mitral valve is normal in structure. No evidence of mitral valve regurgitation. Tricuspid Valve: The tricuspid valve is normal in structure. Tricuspid valve regurgitation is moderate. Aortic Valve: The aortic valve is tricuspid. There is mild calcification of the aortic valve. There is mild thickening of the aortic valve. Aortic valve regurgitation is mild to moderate. Aortic regurgitation PHT measures 721 msec. Aortic valve sclerosis/calcification is present, without any evidence of aortic stenosis. Aortic valve mean gradient measures 6.5 mmHg. Aortic valve peak gradient measures 12.5 mmHg. Aortic valve area, by VTI measures 4.54 cm. Pulmonic Valve: The pulmonic valve was normal in structure. Pulmonic valve regurgitation is not visualized. Aorta: The aortic root is normal in size and structure. There is borderline dilatation of the ascending aorta, measuring 38 mm. IAS/Shunts: No atrial level shunt detected by color flow Doppler. Additional Comments: A device lead is visualized in the right ventricle.  LEFT VENTRICLE PLAX 2D LVOT diam:     3.20 cm      Diastology LV SV:         151          LV e' medial:    4.13 cm/s LV SV Index:   79           LV E/e'  medial:  11.5 LVOT Area:     8.04 cm     LV e' lateral:   2.94 cm/s                             LV E/e' lateral: 16.2  LV Volumes (MOD)            2D Longitudinal Strain LV vol d, MOD A2C: 130.0 ml 2D Strain GLS Avg:     -12.2 % LV vol d, MOD A4C: 149.0 ml LV vol s, MOD A2C: 60.8 ml LV vol s, MOD A4C: 95.5 ml LV SV MOD A2C:     69.2 ml LV SV MOD A4C:     149.0 ml LV SV MOD BP:      63.4 ml RIGHT VENTRICLE RV Basal diam:  5.00 cm RV Mid diam:    3.10  cm RV S prime:     11.70 cm/s TAPSE (M-mode): 1.8 cm LEFT ATRIUM           Index        RIGHT ATRIUM           Index LA diam:      3.30 cm 1.74 cm/m   RA Area:     25.00 cm LA Vol (A2C): 46.2 ml 24.34 ml/m  RA Volume:   95.80 ml  50.48 ml/m LA Vol (A4C): 55.8 ml 29.40 ml/m  AORTIC VALVE                     PULMONIC VALVE AV Area (Vmax):    4.43 cm      PV Vmax:       0.73 m/s AV Area (Vmean):   4.21 cm      PV Vmean:      49.200 cm/s AV Area (VTI):     4.54 cm      PV VTI:        0.176 m AV Vmax:           176.50 cm/s   PV Peak grad:  2.1 mmHg AV Vmean:          117.000 cm/s  PV Mean grad:  1.0 mmHg AV VTI:            0.332 m AV Peak Grad:      12.5 mmHg AV Mean Grad:      6.5 mmHg LVOT Vmax:         97.15 cm/s LVOT Vmean:        61.250 cm/s LVOT VTI:          0.188 m LVOT/AV VTI ratio: 0.56 AI PHT:            721 msec  AORTA Ao Asc diam: 3.80 cm MITRAL VALVE                TRICUSPID VALVE MV Area (PHT): 3.99 cm     TR Peak grad:   62.7 mmHg MV Decel Time: 190 msec     TR Vmax:        396.00 cm/s MV E velocity: 47.50 cm/s MV A velocity: 107.00 cm/s  SHUNTS MV E/A ratio:  0.44         Systemic VTI:  0.19 m                             Systemic Diam: 3.20 cm Mihai  Croitoru MD Electronically signed by Sanda Klein MD Signature Date/Time: 02/16/2022/3:02:16 PM    Final     Cardiac Studies   2D echo 02/16/22 IMPRESSIONS    1. Left ventricular ejection fraction, by estimation, is 30 to 35%. The  left ventricle has moderately decreased function. The left ventricle  demonstrates global hypokinesis. Left ventricular diastolic parameters are  consistent with Grade I diastolic  dysfunction (impaired relaxation). There is severe hypokinesis of the left  ventricular, entire inferolateral wall and inferior wall.   2. Right ventricular systolic function is mildly reduced. The right  ventricular size is moderately enlarged.   3. Left atrial size was mildly dilated.   4. Right atrial size was mildly dilated.    5. The mitral valve is normal in structure. No evidence of mitral valve  regurgitation.   6. Tricuspid valve regurgitation is moderate.   7. The aortic valve is tricuspid. There is mild calcification of the  aortic valve. There is mild thickening of the aortic valve. Aortic valve  regurgitation is mild to moderate. Aortic valve sclerosis/calcification is  present, without any evidence of  aortic stenosis.   8. There is borderline dilatation of the ascending aorta, measuring 38  mm.   Comparison(s): The left ventricular function is worsened. The left  ventricular wall motion abnormalities are unchanged.   Patient Profile     76 y.o. male  with a hx of CAD, CML (2012), HTN, CVA, HLD, COPD on home O2, afib on xarelto, tachy-brady syndrome s/p medtronic PPM (2017)  who is being seen 02/17/2022 for the evaluation of reduced EF at the request of Dr. Broadus John.  Assessment & Plan    Acute on Chronic respiratory failure with hypoxia  Acute on chronic combined systolic and diastolic heart failure - Echo this admission showed EF 30-35% (down from 45-50%), severe hypokinesis of the inferolateral wall and inferior wall, grade I diastolic dysifunction, mildly reduced RV function and moderately enlarged RV  - Per echo report, LV wall motion abnormalities are unchanged, but LV function is worsened.  - Most recent ischemic eval was a stress test in 2017, showed inferior wall and basilar segment of lateral infarction, no reversible ischemia - Patient has a history of CAD, with stents placed to unknown arteries in 2000 - Chest CTA this admit showed L main and 3v disease - Patient's SOB is likely multifactorial, with his history of COPD, recent COVID infection, concerns for pulmonary fibrosis,  heart failure.   - Would benefit from Right and left heart cath to better assess cardiac pressures, pulmonary artery pressure and reevaluate coronary anatomy  - Xarelto on hold>>last dose PM of 2/21  - continue IV  Heparin per pharmacy - He put out 2.4L yesterday and is net neg 2.6L since admission - weight down 4lbs from admit - SCr bumped from 1.06 to 1.52 this am>>discussed with interventional Cards and will do cors only contrast to limit nephrotoxicity and no LVgram - hold ARB, diuretics and spiro - follow renal function closely - transition to entresto at discharge if renal function stable - continue Toprol XL 200mg  daily - Plan for addition of an SGLT2i prior to discharge    Persistent Atrial Fibrillation  - he appears to be going in and out of PAF on tele and some atrial tach - On Toprol XL 200mg  daily - will increase Amio to 200mg  daily to try to suppress PAF - he also had some rapid V pacing on tele (? Tracking at upper rate  when in atach)>>will get pacer interrogated - Anticoagulated with Xarelto >>on hold for cath - resume Xarelto post cath if no CABG needed   CKD stage IIIa - Creatinine bumped from 1.06 to 1.52 this am likely related to diuresis - holding ARB, diuretics and spiro - Continue to monitor   Otherwise managed per primary  - COPD - Pulmonary nodues  - Hypothyroidism  - CML - Abdominal Aortic Aneurysm   I have spent a total of 40 minutes with patient reviewing 2D echo , telemetry, EKGs, labs and examining patient as well as establishing an assessment and plan that was discussed with the patient.  > 50% of time was spent in direct patient care.        For questions or updates, please contact [place] Please consult [email or web address] for contact info under        Signed, Fransico Him, MD  02/18/2022, 12:05 PM

## 2022-02-18 NOTE — Progress Notes (Signed)
IVT consulted for difficult stick.  Upon arrival, RN Tai states consult no longer needed.

## 2022-02-18 NOTE — Progress Notes (Addendum)
OT Cancellation Note  Patient Details Name: Jason [name] MRN: [id number] DOB: April 22, 1946   Cancelled Treatment:    Reason Eval/Treat Not Completed: Patient at procedure or test/ unavailable. Pt with RN staff, getting new IV - previous IV had been pulled and RN staff having to place pressure/elevate due to continued bleeding from the site. OT will continue to follow acutely.  Jason [name] 02/18/2022, 12:17 PM  Jason [name] OTR/L Acute Rehabilitation Services Pager: [phone] Office: [phone]

## 2022-02-18 NOTE — Progress Notes (Signed)
ANTICOAGULATION CONSULT NOTE   Pharmacy Consult for heparin Indication: atrial fibrillation  No Known Allergies  Patient Measurements: Height: 5\' 8"  (172.7 cm) Weight: 73.8 kg (162 lb 11.2 oz) IBW/kg (Calculated) : 68.4   Vital Signs: Temp: 97.3 F (36.3 C) (02/23 0350) Temp Source: Oral (02/23 0350) BP: 106/87 (02/23 0350) Pulse Rate: 99 (02/23 0350)  Labs: Recent Labs    02/15/22 0917 02/15/22 0920 02/15/22 0925 02/15/22 1119 02/16/22 0102 02/17/22 0350 02/18/22 0439  HGB 14.8  --    < >  --  12.4* 12.1* 13.2  HCT 44.6  --    < >  --  37.8* 36.9* 41.3  PLT 253  --   --   --  201 215 275  APTT  --   --   --   --   --   --  52*  HEPARINUNFRC  --   --   --   --   --   --  >1.10*  CREATININE 1.12  --   --   --  1.12 1.06  --   TROPONINIHS  --  26*  --  29*  --   --   --    < > = values in this interval not displayed.     Estimated Creatinine Clearance: 60 mL/min (by C-G formula based on SCr of 1.06 mg/dL).   Medical History: Past Medical History:  Diagnosis Date   Anemia 05/08/2015   CML (chronic myelocytic leukemia) ([place]) 12/16/2011   Coronary heart disease    CVA (cerebrovascular accident) ([place])    GERD (gastroesophageal reflux disease)    Grief reaction 03/18/2014   H/O tobacco use, presenting hazards to health 02/24/2012   No cigarettes since pacemaker placement. Encouraged ongoing efforts    History of chicken pox    History of kidney stones    Hyperlipidemia    Hypertension    IBS (irritable bowel syndrome) 09/20/2013   Medicare annual wellness visit, subsequent 03/23/2014   PAF (paroxysmal atrial fibrillation) ([facility])    Symptomatic bradycardia    a. symptomatic bradycardia due to sinus node dysfunction s/p RA and RV Med[employer]onic pacemaker 04/15/16    Medications:  Facility-Administered Medications Prior to Admission  Medication Dose Route Frequency Provider Last Rate Last Admin   cyanocobalamin ((VITAMIN B-12)) injection 1,000 mcg  1,000 mcg  Intramuscular Q30 days Mosie Lukes, MD   1,000 mcg at 07/15/20 2683   Medications Prior to Admission  Medication Sig Dispense Refill Last Dose   albuterol (VENTOLIN HFA) 108 (90 Base) MCG/ACT inhaler Inhale 2 puffs into the lungs every 6 (six) hours as needed. (Patient taking differently: Inhale 2 puffs into the lungs every 6 (six) hours as needed for shortness of breath.) 18 g 0 unknown   amiodarone (PACERONE) 200 MG tablet Take 1/2 (one-half) tablet by mouth once daily (Patient taking differently: Take 100 mg by mouth daily.) 45 tablet 3 02/14/2022   amLODipine (NORVASC) 5 MG tablet Take 1 tablet by mouth once daily 90 tablet 0 02/14/2022   atorvastatin (LIPITOR) 40 MG tablet Take 1 tablet (40 mg total) by mouth daily. 90 tablet 3 02/14/2022   ferrous sulfate 325 (65 FE) MG EC tablet Take 1 tablet (325 mg total) by mouth 2 (two) times daily. (Patient taking differently: Take 325 mg by mouth daily.) 60 tablet 11 02/14/2022   fluticasone (FLONASE) 50 MCG/ACT nasal spray Place 2 sprays into both nostrils daily. 48 g 3 02/14/2022   imatinib ([name]) 400 MG  tablet TAKE 1 TABLET (400MG  TOTAL) BY MOUTH DAILY. TAKE WITH A MEAL AND LARGE GLASS OF WATER. (Patient taking differently: Take 400 mg by mouth daily.) 30 tablet 6 02/14/2022   levothyroxine (SYNTHROID) 25 MCG tablet Take 1 tablet (25 mcg total) by mouth daily before breakfast. 30 tablet 2 02/14/2022   losartan (COZAAR) 100 MG tablet Take 1 tablet (100 mg total) by mouth daily. 90 tablet 1 02/14/2022   magnesium oxide (MAG-OX) 400 MG tablet Take 1 tablet (400 mg total) by mouth daily. Except Tuesday and Saturday take it BID. (Patient taking differently: Take 400-800 mg by mouth See admin instructions. Take one tablet by mouth every day except on Tuesdays and Saturdays take two tablets by mouth per patient) 30 tablet 2 02/14/2022   meclizine (ANTIVERT) 25 MG tablet TAKE 1 TABLET THREE TIMES DAILY (Patient taking differently: Take 25 mg by mouth 3 (three)  times daily as needed for dizziness.) 90 tablet 0 Past Week   metoprolol succinate (TOPROL-XL) 200 MG 24 hr tablet Take 1 tablet (200 mg total) by mouth daily. Take with or immediately following a meal. 90 tablet 3 02/14/2022 at 0900   nitroGLYCERIN (NITROSTAT) 0.4 MG SL tablet Place 1 tablet (0.4 mg total) under the tongue every 5 (five) minutes as needed for chest pain. 25 tablet 1 unknown   ondansetron (ZOFRAN) 4 MG tablet Take 1 tablet (4 mg total) by mouth 2 (two) times daily as needed for nausea or vomiting. 180 tablet 1 Past Week   rivaroxaban (XARELTO) 20 MG TABS tablet TAKE 1 TABLET BY MOUTH ONCE DAILY WITH SUPPER (Patient taking differently: 20 mg daily.) 90 tablet 1 02/15/2022 at 0900   Saccharomyces boulardii (PROBIOTIC) 250 MG CAPS Take 250 mg by mouth daily. 90 capsule 1 02/14/2022   vitamin B-12 (CYANOCOBALAMIN) 500 MCG tablet Take 500 mcg by mouth daily.   02/14/2022   omeprazole (PRILOSEC) 40 MG capsule Take 1 capsule (40 mg total) by mouth daily. 90 capsule 2 unknown   Scheduled:   amiodarone  100 mg Oral Daily   aspirin EC  81 mg Oral Daily   atorvastatin  40 mg Oral Daily   docusate sodium  100 mg Oral BID   feeding supplement  237 mL Oral BID BM   fluticasone  2 spray Each Nare Daily   furosemide  40 mg Intravenous Daily   levothyroxine  25 mcg Oral QAC breakfast   magnesium oxide  400 mg Oral 2 times per day on Tue Sat   magnesium oxide  400 mg Oral Once per day on Sun Mon Wed Thu Fri   metoprolol  200 mg Oral Daily   multivitamin with minerals  1 tablet Oral Daily   pantoprazole  40 mg Oral Daily   predniSONE  40 mg Oral Q breakfast   sacubitril-valsartan  1 tablet Oral BID   sodium chloride flush  3 mL Intravenous Q12H   sodium chloride flush  3 mL Intravenous Q12H   spironolactone  25 mg Oral Daily    Assessment: 76 yo male with afib and EF 30-35%.  He is on Xarelto (last dose 2/21 at 6pm) and plans are for heart cath. Pharmacy consulted to dose heparin.  -hg=  12.1  2/23 AM update:  aPTT low  Goal of Therapy:  Heparin level 0.3-0.7 units/ml aPTT 66-102 seconds Monitor platelets by anticoagulation protocol: Yes   Plan:  -Inc heparin to 1200 units/hr -1400 aPTT and heparin level  Narda Bonds, PharmD, BCPS Clinical  Pharmacist Phone: [phone]

## 2022-02-18 NOTE — Interval H&P Note (Signed)
History and Physical Interval Note:  02/18/2022 2:51 PM  Jason [name]  has presented today for surgery, with the diagnosis of CAD.  The various methods of treatment have been discussed with the patient and family. After consideration of risks, benefits and other options for treatment, the patient has consented to  Procedure(s): RIGHT/LEFT HEART CATH AND CORONARY ANGIOGRAPHY (N/A) as a surgical intervention.  The patient's history has been reviewed, patient examined, no change in status, stable for surgery.  I have reviewed the patient's chart and labs.  Questions were answered to the patient's satisfaction.     Collier [facility] 02/18/2022 2:51 PM

## 2022-02-18 NOTE — Progress Notes (Signed)
PROGRESS NOTE    Jason [name]  YQI:[id number] DOB: 13-Sep-1946 DOA: 02/15/2022 PCP: Mosie Lukes, MD  Brief Narrative:75/M, long-term smoker, quit in 2017, COPD, history of CVA, CAD, hypertension, dyslipidemia, paroxysmal A-fib on Xarelto, PPM, was diagnosed with COVID in December'22-discharged home on oxygen-2L then, reports progressively improving, and then worsening over the last 3 to 4 days, noticed increased dyspnea on exertion also noticed some mild swelling in his feet. -In the ED he was hypoxic-sats 70s, placed on nonrebreather mask and then 5 L O2, labs notable for hypokalemia, hypomagnesemia, elevated D-dimer, CTA chest-? findings of pulmonary fibrosis, NSIP -Started on broad-spectrum antibiotics and steroids -Also noted to have edema on exam, echo with drop in EF to 30%, left ventricular inferior WMA -Cardiology consulting, plan for LHC today   Subjective: -Feels okay overall, no events of overnight, breathing stable  Assessment and Plan: Acute on chronic respiratory failure with hypoxia ([place]) Pulmonary fibrosis suspected -Likely NSIP based on imaging, long history of smoking, quit in 2017, recent COVID, and amiodarone use -SLP eval to rule out silent aspiration -IV steroids discontinued, transitioned to prednisone taper, switched to p.o. antibiotics to complete 5-day course -Component of CHF too, remains on IV Lasix, see discussion below  Acute on chronic systolic CHF (congestive heart failure) ([place]) -Echo 2/21 noted drop in EF from 45-50% down to 30% with severe left ventricular wall motion abnormalities including inferolateral and inferior wall -Known history of CAD, recalls having stents, > 10 [facility] -Appreciate cardiology input, plan for left heart cath today  -Creatinine bumped to 1.5, noted plan to start Entresto today, will change start date to tomorrow -Remains on IV Lasix, Aldactone, Toprol  Stage 3a chronic kidney disease (CKD) ([place])- (present on  admission) - Bump in creatinine as noted above, Entresto start date changed to tomorrow  PAF (paroxysmal atrial fibrillation) ([place])- (present on admission) Rate controlled with Amiodarone and Toprol XL,  -Toprol dose was recently increased from 100 to 200 by Dr. Curt Bears -Xarelto held, changed to IV heparin for cath  Coronary artery disease- (present on admission) -L main and 3v disease seen on CTA -Followed by [name], [facility] -Cardiology consulted, see discussion above  Pulmonary nodule less than 1 cm in diameter with moderate to high risk for malignant neoplasm -Appears to be stable, also with NSIP and scattered nodularity -Suggest outpatient pulm management  Hypothyroidism- (present on admission) -Continue Synthroid at current dose for now  Infrarenal abdominal aortic aneurysm (AAA) without rupture- (present on admission) -Needs imaging q6 months and outpatient vascular consultation  CML (chronic myelocytic leukemia) ([place])- (present on admission) -Chronic phase but had recurrence when off [place] for 4 months and so resumed -Appears to be stable but due for recheck with Dr. Marin Olp in April (3 months) -[name] on hold now, resume at discharge   DVT prophylaxis: Xarelto changed to IV heparin Code Status: Full code Family Communication: Discussed with patient in detail, no family at bedside Disposition Plan: Home pending cardiology eval  Consultants:  Pulmonary, cardiology  Procedures:   Antimicrobials:    Objective: Vitals:   02/18/22 0350 02/18/22 0729 02/18/22 0919 02/18/22 1117  BP: 106/87 129/69  (!) 147/77  Pulse: 99 62 62 61  Resp: 18 18  18   Temp: (!) 97.3 F (36.3 C) (!) 97.3 F (36.3 C) (!) 97.4 F (36.3 C) (!) 97.3 F (36.3 C)  TempSrc: Oral Oral Oral Oral  SpO2: 92% 93%  98%  Weight: 73.8 kg  Height:        Intake/Output Summary (Last 24 hours) at 02/18/2022 1144 Last data filed at 02/18/2022 1117 Gross per 24 hour  Intake 480 ml   Output 2650 ml  Net -2170 ml   Filed Weights   02/16/22 0513 02/17/22 0411 02/18/22 0350  Weight: 76.2 kg 74.8 kg 73.8 kg    Examination:  General exam: Pleasant elderly male sitting up in bed, AAOx3, no distress HEENT: No JVD CVS: S1-S2, regular rhythm, Lungs: Improved air movement, fine bilateral lower lung rales Abdomen: Soft, nontender, bowel sounds present Extremities: Trace edema  Skin: No rashes Psychiatry:  Mood & affect appropriate.     Data Reviewed:   CBC: Recent Labs  Lab 02/15/22 0917 02/15/22 0925 02/16/22 0102 02/17/22 0350 02/18/22 0439  WBC 9.4  --  4.1 15.4* 15.2*  NEUTROABS 7.5  --   --   --   --   HGB 14.8 15.6 12.4* 12.1* 13.2  HCT 44.6 46.0 37.8* 36.9* 41.3  MCV 92.5  --  92.4 93.7 93.9  PLT 253  --  201 215 268   Basic Metabolic Panel: Recent Labs  Lab 02/15/22 0917 02/15/22 0925 02/16/22 0102 02/17/22 0350 02/18/22 0439  NA 139 144 139 137 137  K 3.1* 3.1* 4.2 4.6 4.5  CL 109  --  108 105 101  CO2 20*  --  24 22 27   GLUCOSE 101*  --  179* 104* 101*  BUN 7*  --  9 18 29*  CREATININE 1.12  --  1.12 1.06 1.52*  CALCIUM 8.6*  --  8.6* 9.2 9.4  MG 1.4*  --   --   --   --    GFR: Estimated Creatinine Clearance: 41.9 mL/min (A) (by C-G formula based on SCr of 1.52 mg/dL (H)). Liver Function Tests: Recent Labs  Lab 02/15/22 0917  AST 21  ALT 11  ALKPHOS 73  BILITOT 1.1  PROT 6.9  ALBUMIN 3.5   Recent Labs  Lab 02/15/22 0917  LIPASE 67*   No results for input(s): AMMONIA in the last 168 hours. Coagulation Profile: No results for input(s): INR, PROTIME in the last 168 hours. Cardiac Enzymes: No results for input(s): CKTOTAL, CKMB, CKMBINDEX, TROPONINI in the last 168 hours. BNP (last 3 results) No results for input(s): PROBNP in the last 8760 hours. HbA1C: No results for input(s): HGBA1C in the last 72 hours. CBG: No results for input(s): GLUCAP in the last 168 hours. Lipid Profile: No results for input(s): CHOL,  HDL, LDLCALC, TRIG, CHOLHDL, LDLDIRECT in the last 72 hours. Thyroid Function Tests: No results for input(s): TSH, T4TOTAL, FREET4, T3FREE, THYROIDAB in the last 72 hours. Anemia Panel: No results for input(s): VITAMINB12, FOLATE, FERRITIN, TIBC, IRON, RETICCTPCT in the last 72 hours. Urine analysis:    Component Value Date/Time   COLORURINE YELLOW 07/31/2021 1845   APPEARANCEUR CLEAR 07/31/2021 1845   LABSPEC <1.005 (L) 07/31/2021 1845   PHURINE 6.5 07/31/2021 1845   GLUCOSEU NEGATIVE 07/31/2021 1845   HGBUR NEGATIVE 07/31/2021 1845   BILIRUBINUR NEGATIVE 07/31/2021 1845   KETONESUR NEGATIVE 07/31/2021 1845   PROTEINUR NEGATIVE 07/31/2021 1845   UROBILINOGEN 0.2 06/27/2014 1315   NITRITE NEGATIVE 07/31/2021 1845   LEUKOCYTESUR NEGATIVE 07/31/2021 1845   Sepsis Labs: @LABRCNTIP (procalcitonin:4,lacticidven:4)  ) Recent Results (from the past 240 hour(s))  Resp Panel by RT-PCR (Flu A&B, Covid) Nasopharyngeal Swab     Status: None   Collection Time: 02/15/22  9:45 AM   Specimen: Nasopharyngeal  Swab; Nasopharyngeal(NP) swabs in vial transport medium  Result Value Ref Range Status   SARS Coronavirus 2 by RT PCR NEGATIVE NEGATIVE Final    Comment: (NOTE) SARS-CoV-2 target nucleic acids are NOT DETECTED.  The SARS-CoV-2 RNA is generally detectable in upper respiratory specimens during the acute phase of infection. The lowest concentration of SARS-CoV-2 viral copies this assay can detect is 138 copies/mL. A negative result does not preclude SARS-Cov-2 infection and should not be used as the sole basis for treatment or other patient management decisions. A negative result may occur with  improper specimen collection/handling, submission of specimen other than nasopharyngeal swab, presence of viral mutation(s) within the areas targeted by this assay, and inadequate number of viral copies(<138 copies/mL). A negative result must be combined with clinical observations, patient history,  and epidemiological information. The expected result is Negative.  Fact Sheet for Patients:  EntrepreneurPulse.com.au  Fact Sheet for Healthcare Providers:  IncredibleEmployment.be  This test is no t yet approved or cleared by the [place] FDA and  has been authorized for detection and/or diagnosis of SARS-CoV-2 by FDA under an Emergency Use Authorization (EUA). This EUA will remain  in effect (meaning this test can be used) for the duration of the COVID-19 declaration under Section 564(b)(1) of the Act, 21 U.S.C.section 360bbb-3(b)(1), unless the authorization is terminated  or revoked sooner.       Influenza A by PCR NEGATIVE NEGATIVE Final   Influenza B by PCR NEGATIVE NEGATIVE Final    Comment: (NOTE) The Xpert Xpress SARS-CoV-2/FLU/RSV plus assay is intended as an aid in the diagnosis of influenza from Nasopharyngeal swab specimens and should not be used as a sole basis for treatment. Nasal washings and aspirates are unacceptable for Xpert Xpress SARS-CoV-2/FLU/RSV testing.  Fact Sheet for Patients: EntrepreneurPulse.com.au  Fact Sheet for Healthcare Providers: IncredibleEmployment.be  This test is not yet approved or cleared by the [place] FDA and has been authorized for detection and/or diagnosis of SARS-CoV-2 by FDA under an Emergency Use Authorization (EUA). This EUA will remain in effect (meaning this test can be used) for the duration of the COVID-19 declaration under Section 564(b)(1) of the Act, 21 U.S.C. section 360bbb-3(b)(1), unless the authorization is terminated or revoked.  Performed at [facility], [address], [place], [place] [address]      Radiology Studies: DG Swallowing [facility] Pathology  Result Date: 02/17/2022 Table formatting from the original result was not included. Objective Swallowing Evaluation: Type of Study: MBS-Modified Barium  Swallow Study Completed and documented by Vaughan Sine, SLP Student Supervised and reviewed by Herbie Baltimore MA CCC-SLP Patient Details Name: Jason [name] MRN: [id number] Date of Birth: 10-Jan-1946 Today's Date: 02/17/2022 Time: SLP Start Time (ACUTE ONLY): 11 -SLP Stop Time (ACUTE ONLY): 0272 SLP Time Calculation (min) (ACUTE ONLY): 14 min Past Medical History: Past Medical History: Diagnosis Date  Anemia 05/08/2015  CML (chronic myelocytic leukemia) ([place]) 12/16/2011  Coronary heart disease   CVA (cerebrovascular accident) ([place])   GERD (gastroesophageal reflux disease)   Grief reaction 03/18/2014  H/O tobacco use, presenting hazards to health 02/24/2012  No cigarettes since pacemaker placement. Encouraged ongoing efforts   History of chicken pox   History of kidney stones   Hyperlipidemia   Hypertension   IBS (irritable bowel syndrome) 09/20/2013  Medicare annual wellness visit, subsequent 03/23/2014  PAF (paroxysmal atrial fibrillation) ([facility])   Symptomatic bradycardia   a. symptomatic bradycardia due to sinus node dysfunction s/p RA and RV Medtronic pacemaker  04/15/16 Past Surgical History: Past Surgical History: Procedure Laterality Date  CATARACT EXTRACTION    CHOLECYSTECTOMY  2003  CORONARY ANGIOPLASTY WITH STENT PLACEMENT  2000  EP IMPLANTABLE DEVICE N/A 04/15/2016  Procedure: Pacemaker Implant;  Surgeon: Evans Lance, MD; Medtronic (serial number [id number]) pacemaker; Laterality: Left No data recorded No data recorded  Recommendations for follow up therapy are one component of a multi-disciplinary discharge planning process, led by the attending physician.  Recommendations may be updated based on patient status, additional functional criteria and insurance authorization. Assessment / Plan / Recommendation Clinical Impressions 02/17/2022 Clinical Impression Patient was seen for a MBS d/t concerns of chronic silent aspiration. Oral mech exam WFL with adequate labial and lingual ROM and strength observed. MBS  revealed that there are no clinical indications of aspiration, penetration or oropharyngeal dysphagia. Mastication, lateralization, and bolus preparation/propulsion WFL across all consistencies/textures. Epiglottic inversion and laryngeal vestibule closure present. Noted some residue at the level of the pyriform sinuses; pt able to clear residue independently. Neither penetration or aspiration was observed in MBS. Noted that pill with thin was effortful to clear; resolved when followed with sips of thin liquids. SLP recommends patient continue regular/thin diet; Patient educated on taking medications with cup of water to ensure they are passing effectively. SLP Visit Diagnosis Dysphagia, unspecified (R13.10) Attention and concentration deficit following -- Frontal lobe and executive function deficit following -- Impact on safety and function No limitations   Treatment Recommendations 02/17/2022 Treatment Recommendations No treatment recommended at this time   Prognosis 02/17/2022 Prognosis for Safe Diet Advancement Good Barriers to Reach Goals -- Barriers/Prognosis Comment -- Diet Recommendations 02/17/2022 SLP Diet Recommendations Regular solids;Thin liquid Liquid Administration via Cup;Straw Medication Administration Whole meds with liquid Compensations -- Postural Changes Remain semi-upright after after feeds/meals (Comment);Seated upright at 90 degrees   Other Recommendations 02/17/2022 Recommended Consults -- Oral Care Recommendations Oral care BID Other Recommendations -- Follow Up Recommendations No SLP follow up Assistance recommended at discharge None Functional Status Assessment Patient has had a recent decline in their functional status and demonstrates the ability to make significant improvements in function in a reasonable and predictable amount of time. No flowsheet data found.  Oral Phase 02/17/2022 Oral Phase WFL Oral - Pudding Teaspoon -- Oral - Pudding Cup -- Oral - Honey Teaspoon -- Oral - Honey Cup --  Oral - Nectar Teaspoon -- Oral - Nectar Cup -- Oral - Nectar Straw -- Oral - Thin Teaspoon -- Oral - Thin Cup -- Oral - Thin Straw -- Oral - Puree -- Oral - Mech Soft -- Oral - Regular -- Oral - Multi-Consistency -- Oral - Pill -- Oral Phase - Comment --  Pharyngeal Phase 02/17/2022 Pharyngeal Phase WFL Pharyngeal- Pudding Teaspoon -- Pharyngeal -- Pharyngeal- Pudding Cup -- Pharyngeal -- Pharyngeal- Honey Teaspoon -- Pharyngeal -- Pharyngeal- Honey Cup -- Pharyngeal -- Pharyngeal- Nectar Teaspoon -- Pharyngeal -- Pharyngeal- Nectar Cup -- Pharyngeal -- Pharyngeal- Nectar Straw -- Pharyngeal -- Pharyngeal- Thin Teaspoon -- Pharyngeal -- Pharyngeal- Thin Cup -- Pharyngeal -- Pharyngeal- Thin Straw -- Pharyngeal -- Pharyngeal- Puree -- Pharyngeal -- Pharyngeal- Mechanical Soft -- Pharyngeal -- Pharyngeal- Regular -- Pharyngeal -- Pharyngeal- Multi-consistency -- Pharyngeal -- Pharyngeal- Pill -- Pharyngeal -- Pharyngeal Comment --  No flowsheet data found. DeBlois, Katherene Ponto 02/17/2022, 1:18 PM                     ECHOCARDIOGRAM COMPLETE  Result Date: 02/16/2022    ECHOCARDIOGRAM REPORT   Patient Name:  Earley Abide Date of Exam: 02/16/2022 Medical Rec #:  [id number]     Height:       68.0 in Accession #:    [id number]    Weight:       168.0 lb Date of Birth:  [birth year]     BSA:          1.898 m Patient Age:    13 years      BP:           131/76 mmHg Patient Gender: M             HR:           71 bpm. Exam Location:  Inpatient Procedure: 2D Echo, Cardiac Doppler, Color Doppler and Strain Analysis Indications:    Dyspnea  History:        Patient has prior history of Echocardiogram examinations, most                 recent 09/16/2021. CAD, Stroke, Arrythmias:Atrial Fibrillation                 and Bradycardia; Risk Factors:Hypertension. 04/15/16 pacemaker.  Sonographer:    Luisa Hart RDCS Referring Phys: [place]  1. Left ventricular ejection fraction, by estimation, is 30 to 35%. The  left ventricle has moderately decreased function. The left ventricle demonstrates global hypokinesis. Left ventricular diastolic parameters are consistent with Grade I diastolic dysfunction (impaired relaxation). There is severe hypokinesis of the left ventricular, entire inferolateral wall and inferior wall.  2. Right ventricular systolic function is mildly reduced. The right ventricular size is moderately enlarged.  3. Left atrial size was mildly dilated.  4. Right atrial size was mildly dilated.  5. The mitral valve is normal in structure. No evidence of mitral valve regurgitation.  6. Tricuspid valve regurgitation is moderate.  7. The aortic valve is tricuspid. There is mild calcification of the aortic valve. There is mild thickening of the aortic valve. Aortic valve regurgitation is mild to moderate. Aortic valve sclerosis/calcification is present, without any evidence of aortic stenosis.  8. There is borderline dilatation of the ascending aorta, measuring 38 mm. Comparison(s): The left ventricular function is worsened. The left ventricular wall motion abnormalities are unchanged. FINDINGS  Left Ventricle: Left ventricular ejection fraction, by estimation, is 30 to 35%. The left ventricle has moderately decreased function. The left ventricle demonstrates global hypokinesis. Severe hypokinesis of the left ventricular, entire inferolateral wall and inferior wall. The left ventricular internal cavity size was normal in size. There is no left ventricular hypertrophy. Left ventricular diastolic parameters are consistent with Grade I diastolic dysfunction (impaired relaxation). Indeterminate filling pressures. Right Ventricle: The right ventricular size is moderately enlarged. No increase in right ventricular wall thickness. Right ventricular systolic function is mildly reduced. Left Atrium: Left atrial size was mildly dilated. Right Atrium: Right atrial size was mildly dilated. Prominent Eustachian valve.  Pericardium: There is no evidence of pericardial effusion. Mitral Valve: The mitral valve is normal in structure. No evidence of mitral valve regurgitation. Tricuspid Valve: The tricuspid valve is normal in structure. Tricuspid valve regurgitation is moderate. Aortic Valve: The aortic valve is tricuspid. There is mild calcification of the aortic valve. There is mild thickening of the aortic valve. Aortic valve regurgitation is mild to moderate. Aortic regurgitation PHT measures 721 msec. Aortic valve sclerosis/calcification is present, without any evidence of aortic stenosis. Aortic valve mean gradient measures 6.5 mmHg. Aortic valve peak gradient measures 12.5 mmHg. Aortic  valve area, by VTI measures 4.54 cm. Pulmonic Valve: The pulmonic valve was normal in structure. Pulmonic valve regurgitation is not visualized. Aorta: The aortic root is normal in size and structure. There is borderline dilatation of the ascending aorta, measuring 38 mm. IAS/Shunts: No atrial level shunt detected by color flow Doppler. Additional Comments: A device lead is visualized in the right ventricle.  LEFT VENTRICLE PLAX 2D LVOT diam:     3.20 cm      Diastology LV SV:         151          LV e' medial:    4.13 cm/s LV SV Index:   79           LV E/e' medial:  11.5 LVOT Area:     8.04 cm     LV e' lateral:   2.94 cm/s                             LV E/e' lateral: 16.2  LV Volumes (MOD)            2D Longitudinal Strain LV vol d, MOD A2C: 130.0 ml 2D Strain GLS Avg:     -12.2 % LV vol d, MOD A4C: 149.0 ml LV vol s, MOD A2C: 60.8 ml LV vol s, MOD A4C: 95.5 ml LV SV MOD A2C:     69.2 ml LV SV MOD A4C:     149.0 ml LV SV MOD BP:      63.4 ml RIGHT VENTRICLE RV Basal diam:  5.00 cm RV Mid diam:    3.10 cm RV S prime:     11.70 cm/s TAPSE (M-mode): 1.8 cm LEFT ATRIUM           Index        RIGHT ATRIUM           Index LA diam:      3.30 cm 1.74 cm/m   RA Area:     25.00 cm LA Vol (A2C): 46.2 ml 24.34 ml/m  RA Volume:   95.80 ml  50.48 ml/m  LA Vol (A4C): 55.8 ml 29.40 ml/m  AORTIC VALVE                     PULMONIC VALVE AV Area (Vmax):    4.43 cm      PV Vmax:       0.73 m/s AV Area (Vmean):   4.21 cm      PV Vmean:      49.200 cm/s AV Area (VTI):     4.54 cm      PV VTI:        0.176 m AV Vmax:           176.50 cm/s   PV Peak grad:  2.1 mmHg AV Vmean:          117.000 cm/s  PV Mean grad:  1.0 mmHg AV VTI:            0.332 m AV Peak Grad:      12.5 mmHg AV Mean Grad:      6.5 mmHg LVOT Vmax:         97.15 cm/s LVOT Vmean:        61.250 cm/s LVOT VTI:          0.188 m LVOT/AV VTI ratio: 0.56 AI PHT:  721 msec  AORTA Ao Asc diam: 3.80 cm MITRAL VALVE                TRICUSPID VALVE MV Area (PHT): 3.99 cm     TR Peak grad:   62.7 mmHg MV Decel Time: 190 msec     TR Vmax:        396.00 cm/s MV E velocity: 47.50 cm/s MV A velocity: 107.00 cm/s  SHUNTS MV E/A ratio:  0.44         Systemic VTI:  0.19 m                             Systemic Diam: 3.20 cm Mihai Croitoru MD Electronically signed by Sanda Klein MD Signature Date/Time: 02/16/2022/3:02:16 PM    Final      Scheduled Meds:  amiodarone  100 mg Oral Daily   aspirin EC  81 mg Oral Daily   atorvastatin  40 mg Oral Daily   docusate sodium  100 mg Oral BID   feeding supplement  237 mL Oral BID BM   fluticasone  2 spray Each Nare Daily   furosemide  40 mg Intravenous Daily   levothyroxine  25 mcg Oral QAC breakfast   magnesium oxide  400 mg Oral 2 times per day on Tue Sat   magnesium oxide  400 mg Oral Once per day on Sun Mon Wed Thu Fri   metoprolol  200 mg Oral Daily   multivitamin with minerals  1 tablet Oral Daily   pantoprazole  40 mg Oral Daily   predniSONE  40 mg Oral Q breakfast   [START ON 02/19/2022] sacubitril-valsartan  1 tablet Oral BID   sodium chloride flush  3 mL Intravenous Q12H   sodium chloride flush  3 mL Intravenous Q12H   spironolactone  25 mg Oral Daily   Continuous Infusions:  sodium chloride     sodium chloride 10 mL/hr at 02/18/22 7673    heparin 1,200 Units/hr (02/18/22 0905)     LOS: 3 days    Time spent: 60min    Domenic Polite, MD [facility]   02/18/2022, 11:44 AM

## 2022-02-19 ENCOUNTER — Other Ambulatory Visit (HOSPITAL_COMMUNITY): Payer: Self-pay

## 2022-02-19 ENCOUNTER — Encounter (HOSPITAL_COMMUNITY): Payer: Self-pay | Admitting: Cardiology

## 2022-02-19 DIAGNOSIS — N1831 Chronic kidney disease, stage 3a: Secondary | ICD-10-CM

## 2022-02-19 LAB — CBC
HCT: 42.4 % (ref 39.0–52.0)
Hemoglobin: 13.5 g/dL (ref 13.0–17.0)
MCH: 29.5 pg (ref 26.0–34.0)
MCHC: 31.8 g/dL (ref 30.0–36.0)
MCV: 92.8 fL (ref 80.0–100.0)
Platelets: 254 10*3/uL (ref 150–400)
RBC: 4.57 MIL/uL (ref 4.22–5.81)
RDW: 15.6 % — ABNORMAL HIGH (ref 11.5–15.5)
WBC: 13.3 10*3/uL — ABNORMAL HIGH (ref 4.0–10.5)
nRBC: 0 % (ref 0.0–0.2)

## 2022-02-19 LAB — BASIC METABOLIC PANEL
Anion gap: 8 (ref 5–15)
BUN: 27 mg/dL — ABNORMAL HIGH (ref 8–23)
CO2: 28 mmol/L (ref 22–32)
Calcium: 9.1 mg/dL (ref 8.9–10.3)
Chloride: 105 mmol/L (ref 98–111)
Creatinine, Ser: 1.37 mg/dL — ABNORMAL HIGH (ref 0.61–1.24)
GFR, Estimated: 54 mL/min — ABNORMAL LOW (ref >60)
Glucose, Bld: 87 mg/dL (ref 70–99)
Potassium: 4.1 mmol/L (ref 3.5–5.1)
Sodium: 141 mmol/L (ref 135–145)

## 2022-02-19 MED ORDER — AMIODARONE HCL 200 MG PO TABS
200.0000 mg | ORAL_TABLET | Freq: Every day | ORAL | 2 refills | Status: DC
  Filled 2022-02-19: qty 30, 30d supply, fill #0

## 2022-02-19 MED ORDER — PREDNISONE 20 MG PO TABS
40.0000 mg | ORAL_TABLET | Freq: Every day | ORAL | 0 refills | Status: AC
Start: 2022-02-20 — End: 2022-02-22
  Filled 2022-02-19: qty 4, 2d supply, fill #0

## 2022-02-19 MED ORDER — AMIODARONE HCL 200 MG PO TABS
200.0000 mg | ORAL_TABLET | Freq: Every day | ORAL | Status: DC
  Administered 2022-02-19: 200 mg via ORAL
  Filled 2022-02-19: qty 1

## 2022-02-19 MED ORDER — ATORVASTATIN CALCIUM 80 MG PO TABS
80.0000 mg | ORAL_TABLET | Freq: Every day | ORAL | 2 refills | Status: AC
  Filled 2022-02-19: qty 30, 30d supply, fill #0

## 2022-02-19 MED ORDER — ASPIRIN 81 MG PO TBEC
81.0000 mg | DELAYED_RELEASE_TABLET | Freq: Every day | ORAL | 2 refills | Status: DC
  Filled 2022-02-19: qty 30, 30d supply, fill #0

## 2022-02-19 MED ORDER — EMPAGLIFLOZIN 10 MG PO TABS
10.0000 mg | ORAL_TABLET | Freq: Every day | ORAL | 2 refills | Status: AC
  Filled 2022-02-19: qty 30, 30d supply, fill #0

## 2022-02-19 NOTE — Assessment & Plan Note (Addendum)
Continue prednisone on discharge to complete 5-day course of steroid treatment for acute COPD exacerbation.  Patient require 3 L nasal cannula on discharge.

## 2022-02-19 NOTE — Telephone Encounter (Signed)
Patient is scheduled for full PFT and HFU on 03/10/2022 with Dr. Vaughan Browner.

## 2022-02-19 NOTE — Progress Notes (Signed)
SATURATION QUALIFICATIONS: (This note is used to comply with regulatory documentation for home oxygen)  Patient Saturations on Room Air at Rest = 85%  Patient Saturations on Room Air while Ambulating = N/A%  Patient Saturations on 3 Liters of oxygen while Ambulating = 90%  Please briefly explain why patient needs home oxygen:  Nelta Numbers Acute Rehab Phone: [phone] Office Phone: [phone]

## 2022-02-19 NOTE — Progress Notes (Signed)
Heart Failure Navigator Progress Note  Assessed for Heart & Vascular TOC clinic readiness.  ECHO this admission with LVEF 30-35% and mildly reduced RV function.   Scheduled appt with HF TOC clinic on 3/8 at 12:00.   Will check BMP at visit.  Kerby Nora, PharmD, BCPS Heart Failure Stewardship Pharmacist Phone [phone]

## 2022-02-19 NOTE — Progress Notes (Signed)
Mobility Specialist Progress Note: ° ° 02/19/22 1000  °Mobility  °Activity Ambulated with assistance in hallway  °Level of Assistance Standby assist, set-up cues, supervision of patient - no hands on  °Assistive Device None  °Distance Ambulated (ft) 450 ft  °Activity Response Tolerated well  °$Mobility charge 1 Mobility  ° °Pt requiring 3LO2 at this time to maintain SpO2 >90%. No physical assist required. Pt back sitting EOB with all needs met.  ° °Jason [name] °Acute Rehab °Phone: [phone] °Office Phone: [phone] ° °

## 2022-02-19 NOTE — Assessment & Plan Note (Signed)
Etiology likely secondary to type II demand ischemia in the setting of decompensated CHF as above, although multivessel CAD noted on left heart catheterization with recommendations of medical management per cardiology as above.

## 2022-02-19 NOTE — TOC Transition Note (Addendum)
Transition of Care [facility]) - CM/SW Discharge Note   Patient Details  Name: Jason [name] MRN: [id number] Date of Birth: 30-Apr-1946  Transition of Care [facility]) CM/SW Contact:  Zenon Mayo, RN Phone Number: 02/19/2022, 9:53 AM   Clinical Narrative:    Patient is for dc today, he states his granddaughter will transport him home today.  He is on 2 liters of oxygen already , he will ask grand daughter to bring his tank when she comes to pick him up.  Mobility tech is checking ambulatory sats also to make sure he does not need more than 2 liters of oxygen.  NCM notified Tommi Rumps with [facility] about dc today.  [place] daughter is on the way to pick him up. He is now needing 3 liters, he has oxygen with [employer], NCM informed Jermaine with [employer] that patient is now needing 3 liters has new orders.      Barriers to Discharge: Continued Medical Work up   Patient Goals and CMS Choice Patient states their goals for this hospitalization and ongoing recovery are:: return home CMS Medicare.gov Compare Post Acute Care list provided to:: Patient Choice offered to / list presented to : Patient  Discharge Placement                       Discharge Plan and Services   Discharge Planning Services: CM Consult Post Acute Care Choice: Home Health            DME Agency: NA       HH Arranged: RN, PT, OT, Disease Management [place] Agency: [place] Date [facility] Agency Contacted: 02/16/22 Time [place]: [id number] Representative spoke with at [place]: [place] ([place]) Interventions     Readmission Risk Interventions Readmission Risk Prevention Plan 02/16/2022  Transportation Screening Complete  PCP or Specialist Appt within 3-5 Days Complete  HRI or [place] Complete  Social Work Consult for [place] Planning/Counseling Complete  Palliative Care Screening Not Applicable  Medication Review [employer]) Complete  Some recent data  might be hidden

## 2022-02-19 NOTE — Hospital Course (Signed)
Jason [name] is a 76 year old male with past medical history significant for chronic combined systolic/diastolic congestive heart failure, COPD, history of CVA, CAD, essential hypertension, dyslipidemia, paroxysmal atrial fibrillation on Xarelto, s/p PPM, long-term smoker who quit in 2017, recent COVID-19 viral pneumonia December 2022 and discharged home on 2 L oxygen who presented to [facility] ED on 2/20 with progressive shortness of breath, worse with exertion and swelling in his feet.  In the ED, patient was noted to be hypoxic with SPO2's in the 70s and placed on a nonrebreather.  Labs notable for hypokalemia, hypomagnesemia, elevated D-dimer, CT at bedtime with findings concerning for possible underlying pulmonary fibrosis/NSIP.  Patient was started on broad-spectrum antibiotics and steroids.  Hospital service consulted for further evaluation and management.

## 2022-02-19 NOTE — Progress Notes (Signed)
Progress Note  Patient Name: Jason [name] Date of Encounter: 02/19/2022  [facility] Cardiologist: None NEW  Subjective   Denies any chest pain or SOB.  Cath yesterday showed 30% pLAD, 90% Ramus, 30% pLCx and 85% mLCx, occluded RPDA, normal LVEDP and mild PHTN>>medical therapy recommended  Inpatient Medications    Scheduled Meds:  amiodarone  100 mg Oral Daily   aspirin EC  81 mg Oral Daily   atorvastatin  40 mg Oral Daily   docusate sodium  100 mg Oral BID   feeding supplement  237 mL Oral BID BM   fluticasone  2 spray Each Nare Daily   levothyroxine  25 mcg Oral QAC breakfast   magnesium oxide  400 mg Oral 2 times per day on Tue Sat   magnesium oxide  400 mg Oral Once per day on Sun Mon Wed Thu Fri   metoprolol  200 mg Oral Daily   multivitamin with minerals  1 tablet Oral Daily   pantoprazole  40 mg Oral Daily   predniSONE  40 mg Oral Q breakfast   rivaroxaban  20 mg Oral Q supper   sodium chloride flush  3 mL Intravenous Q12H   sodium chloride flush  3 mL Intravenous Q12H   sodium chloride flush  3 mL Intravenous Q12H   Continuous Infusions:  sodium chloride     PRN Meds: sodium chloride, acetaminophen **OR** acetaminophen, albuterol, bisacodyl, guaiFENesin, hydrALAZINE, ipratropium-albuterol, ondansetron **OR** ondansetron (ZOFRAN) IV, oxyCODONE, polyethylene glycol, sodium chloride flush   Vital Signs    Vitals:   02/18/22 1526 02/18/22 1531 02/18/22 1931 02/19/22 0441  BP: 132/67 130/72 118/73 127/70  Pulse: 69 67 60 62  Resp: 18 20 20 20   Temp:   (!) 97.5 F (36.4 C) 97.6 F (36.4 C)  TempSrc:   Oral Oral  SpO2: 98% 98% 96% 96%  Weight:    70.6 kg  Height:        Intake/Output Summary (Last 24 hours) at 02/19/2022 0756 Last data filed at 02/19/2022 0600 Gross per 24 hour  Intake 941.7 ml  Output 1875 ml  Net -933.3 ml    Last 3 Weights 02/19/2022 02/18/2022 02/17/2022  Weight (lbs) 155 lb 11.2 oz 162 lb 11.2 oz 164 lb 14.4 oz  Weight (kg)  70.625 kg 73.8 kg 74.798 kg      Telemetry    Atrial paced with PACs - Personally Reviewed  ECG    No new EKG to review - Personally Reviewed  Physical Exam   GEN: Well nourished, well developed in no acute distress HEENT: Normal NECK: No JVD; No carotid bruits LYMPHATICS: No lymphadenopathy CARDIAC:RRR, no murmurs, rubs, gallops with occasional ectopy RESPIRATORY:  Clear to auscultation without rales, wheezing or rhonchi  ABDOMEN: Soft, non-tender, non-distended MUSCULOSKELETAL:  No edema; No deformity  SKIN: Warm and dry NEUROLOGIC:  Alert and oriented x 3 PSYCHIATRIC:  Normal affect   Labs    High Sensitivity Troponin:   Recent Labs  Lab 02/15/22 0920 02/15/22 1119  TROPONINIHS 26* 29*       Chemistry Recent Labs  Lab 02/15/22 0917 02/15/22 0925 02/17/22 0350 02/18/22 0439 02/18/22 1517 02/18/22 1518 02/18/22 1522 02/19/22 0611  NA 139   < > 137 137   < > 142 141   140 141  K 3.1*   < > 4.6 4.5   < > 3.3* 3.5   3.5 4.1  CL 109   < > 105 101  --   --   --  105  CO2 20*   < > 22 27  --   --   --  28  GLUCOSE 101*   < > 104* 101*  --   --   --  87  BUN 7*   < > 18 29*  --   --   --  27*  CREATININE 1.12   < > 1.06 1.52*  --   --   --  1.37*  CALCIUM 8.6*   < > 9.2 9.4  --   --   --  9.1  PROT 6.9  --   --   --   --   --   --   --   ALBUMIN 3.5  --   --   --   --   --   --   --   AST 21  --   --   --   --   --   --   --   ALT 11  --   --   --   --   --   --   --   ALKPHOS 73  --   --   --   --   --   --   --   BILITOT 1.1  --   --   --   --   --   --   --   GFRNONAA >60   < > >60 48*  --   --   --  54*  ANIONGAP 10   < > 10 9  --   --   --  8   < > = values in this interval not displayed.      Hematology Recent Labs  Lab 02/17/22 0350 02/18/22 0439 02/18/22 1517 02/18/22 1518 02/18/22 1522 02/19/22 0611  WBC 15.4* 15.2*  --   --   --  13.3*  RBC 3.94* 4.40  --   --   --  4.57  HGB 12.1* 13.2   < > 13.6 14.6   14.3 13.5  HCT 36.9* 41.3   < >  40.0 43.0   42.0 42.4  MCV 93.7 93.9  --   --   --  92.8  MCH 30.7 30.0  --   --   --  29.5  MCHC 32.8 32.0  --   --   --  31.8  RDW 16.0* 16.0*  --   --   --  15.6*  PLT 215 275  --   --   --  254   < > = values in this interval not displayed.     BNPNo results for input(s): BNP, PROBNP in the last 168 hours.   DDimer  Recent Labs  Lab 02/15/22 [phone]  DDIMER 2.03*      CHA2DS2-VASc Score = 3  This indicates a 3.2% annual risk of stroke. The patient's score is based upon: CHF History: 1 HTN History: 1 Diabetes History: 0 Stroke History: 0 Vascular Disease History: 0 Age Score: 1 Gender Score: 0   Radiology    CARDIAC CATHETERIZATION  Result Date: 02/18/2022   Prox LAD lesion is 30% stenosed.   Lat Ramus lesion is 90% stenosed.   Prox Cx to Mid Cx lesion is 30% stenosed.   Mid Cx lesion is 85% stenosed.   RPDA lesion is 100% stenosed.   LV end diastolic pressure is normal.   Hemodynamic findings consistent with mild pulmonary hypertension. Obstructive CAD. 90% sub branch of  trifurcating ramus intermediate. 85% focal LCx stenosis just distal to prior stent. 100% PDA at prior stent with left to right collaterals. Normal/low LV filling pressures. PCWP 3 mm Hg Mild to moderate pulmonary HTN mean 34 mm Hg Fair cardiac output index 2.47. Plan; recommend medical therapy. The lesions in the LCX and ramus involve fairly small vessels and his LV dysfunction is significantly out of proportion to his CAD   DG Swallowing Func-Speech Pathology  Result Date: 02/17/2022 Table formatting from the original result was not included. Objective Swallowing Evaluation: Type of Study: MBS-Modified Barium Swallow Study Completed and documented by Vaughan Sine, SLP Student Supervised and reviewed by Herbie Baltimore MA CCC-SLP Patient Details Name: Jason [name] MRN: [id number] Date of Birth: [birth year] Today's Date: 02/17/2022 Time: SLP Start Time (ACUTE ONLY): 40 -SLP Stop Time (ACUTE ONLY): 0488 SLP  Time Calculation (min) (ACUTE ONLY): 14 min Past Medical History: Past Medical History: Diagnosis Date  Anemia 05/08/2015  CML (chronic myelocytic leukemia) ([place]) 12/16/2011  Coronary heart disease   CVA (cerebrovascular accident) ([place])   GERD (gastroesophageal reflux disease)   Grief reaction 03/18/2014  H/O tobacco use, presenting hazards to health 02/24/2012  No cigarettes since pacemaker placement. Encouraged ongoing efforts   History of chicken pox   History of kidney stones   Hyperlipidemia   Hypertension   IBS (irritable bowel syndrome) 09/20/2013  Medicare annual wellness visit, subsequent 03/23/2014  PAF (paroxysmal atrial fibrillation) (HCC)   Symptomatic bradycardia   a. symptomatic bradycardia due to sinus node dysfunction s/p RA and RV Medtronic pacemaker 04/15/16 Past Surgical History: Past Surgical History: Procedure Laterality Date  CATARACT EXTRACTION    CHOLECYSTECTOMY  2003  CORONARY ANGIOPLASTY WITH STENT PLACEMENT  2000  EP IMPLANTABLE DEVICE N/A 04/15/2016  Procedure: Pacemaker Implant;  Surgeon: Evans Lance, MD; Medtronic (serial number [id number]) pacemaker; Laterality: Left No data recorded No data recorded  Recommendations for follow up therapy are one component of a multi-disciplinary discharge planning process, led by the attending physician.  Recommendations may be updated based on patient status, additional functional criteria and insurance authorization. Assessment / Plan / Recommendation Clinical Impressions 02/17/2022 Clinical Impression Patient was seen for a MBS d/t concerns of chronic silent aspiration. Oral mech exam WFL with adequate labial and lingual ROM and strength observed. MBS revealed that there are no clinical indications of aspiration, penetration or oropharyngeal dysphagia. Mastication, lateralization, and bolus preparation/propulsion WFL across all consistencies/textures. Epiglottic inversion and laryngeal vestibule closure present. Noted some residue at the level of the  pyriform sinuses; pt able to clear residue independently. Neither penetration or aspiration was observed in MBS. Noted that pill with thin was effortful to clear; resolved when followed with sips of thin liquids. SLP recommends patient continue regular/thin diet; Patient educated on taking medications with cup of water to ensure they are passing effectively. SLP Visit Diagnosis Dysphagia, unspecified (R13.10) Attention and concentration deficit following -- Frontal lobe and executive function deficit following -- Impact on safety and function No limitations   Treatment Recommendations 02/17/2022 Treatment Recommendations No treatment recommended at this time   Prognosis 02/17/2022 Prognosis for Safe Diet Advancement Good Barriers to Reach Goals -- Barriers/Prognosis Comment -- Diet Recommendations 02/17/2022 SLP Diet Recommendations Regular solids;Thin liquid Liquid Administration via Cup;Straw Medication Administration Whole meds with liquid Compensations -- Postural Changes Remain semi-upright after after feeds/meals (Comment);Seated upright at 90 degrees   Other Recommendations 02/17/2022 Recommended Consults -- Oral Care Recommendations Oral care BID Other Recommendations -- Follow Up Recommendations No SLP  follow up Assistance recommended at discharge None Functional Status Assessment Patient has had a recent decline in their functional status and demonstrates the ability to make significant improvements in function in a reasonable and predictable amount of time. No flowsheet data found.  Oral Phase 02/17/2022 Oral Phase WFL Oral - Pudding Teaspoon -- Oral - Pudding Cup -- Oral - Honey Teaspoon -- Oral - Honey Cup -- Oral - Nectar Teaspoon -- Oral - Nectar Cup -- Oral - Nectar Straw -- Oral - Thin Teaspoon -- Oral - Thin Cup -- Oral - Thin Straw -- Oral - Puree -- Oral - Mech Soft -- Oral - Regular -- Oral - Multi-Consistency -- Oral - Pill -- Oral Phase - Comment --  Pharyngeal Phase 02/17/2022 Pharyngeal Phase WFL  Pharyngeal- Pudding Teaspoon -- Pharyngeal -- Pharyngeal- Pudding Cup -- Pharyngeal -- Pharyngeal- Honey Teaspoon -- Pharyngeal -- Pharyngeal- Honey Cup -- Pharyngeal -- Pharyngeal- Nectar Teaspoon -- Pharyngeal -- Pharyngeal- Nectar Cup -- Pharyngeal -- Pharyngeal- Nectar Straw -- Pharyngeal -- Pharyngeal- Thin Teaspoon -- Pharyngeal -- Pharyngeal- Thin Cup -- Pharyngeal -- Pharyngeal- Thin Straw -- Pharyngeal -- Pharyngeal- Puree -- Pharyngeal -- Pharyngeal- Mechanical Soft -- Pharyngeal -- Pharyngeal- Regular -- Pharyngeal -- Pharyngeal- Multi-consistency -- Pharyngeal -- Pharyngeal- Pill -- Pharyngeal -- Pharyngeal Comment --  No flowsheet data found. DeBlois, Katherene Ponto 02/17/2022, 1:18 PM                      Cardiac Studies   2D echo 02/16/22 IMPRESSIONS    1. Left ventricular ejection fraction, by estimation, is 30 to 35%. The  left ventricle has moderately decreased function. The left ventricle  demonstrates global hypokinesis. Left ventricular diastolic parameters are  consistent with Grade I diastolic  dysfunction (impaired relaxation). There is severe hypokinesis of the left  ventricular, entire inferolateral wall and inferior wall.   2. Right ventricular systolic function is mildly reduced. The right  ventricular size is moderately enlarged.   3. Left atrial size was mildly dilated.   4. Right atrial size was mildly dilated.   5. The mitral valve is normal in structure. No evidence of mitral valve  regurgitation.   6. Tricuspid valve regurgitation is moderate.   7. The aortic valve is tricuspid. There is mild calcification of the  aortic valve. There is mild thickening of the aortic valve. Aortic valve  regurgitation is mild to moderate. Aortic valve sclerosis/calcification is  present, without any evidence of  aortic stenosis.   8. There is borderline dilatation of the ascending aorta, measuring 38  mm.   Comparison(s): The left ventricular function is worsened. The left   ventricular wall motion abnormalities are unchanged.   Patient Profile     76 y.o. male  with a hx of CAD, CML (2012), HTN, CVA, HLD, COPD on home O2, afib on xarelto, tachy-brady syndrome s/p medtronic PPM (2017)  who is being seen 02/17/2022 for the evaluation of reduced EF at the request of Dr. Broadus John.  Assessment & Plan    Acute on Chronic respiratory failure with hypoxia  Acute on chronic combined systolic and diastolic heart failure - Echo this admission showed EF 30-35% (down from 45-50%), severe hypokinesis of the inferolateral wall and inferior wall, grade I diastolic dysifunction, mildly reduced RV function and moderately enlarged RV  - Per echo report, LV wall motion abnormalities are unchanged, but LV function is worsened.  - Most recent ischemic eval was a stress test in 2017, showed inferior  wall and basilar segment of lateral infarction, no reversible ischemia - Patient has a history of CAD, with stents placed to unknown arteries in 2000 - Chest CTA this admit showed L main and 3v disease - Patient's SOB is likely multifactorial, with his history of COPD, recent COVID infection, concerns for pulmonary fibrosis,  heart failure.   - He put out 1.88L and is net neg 2.3L since admit - weight down 7lbs from yesterday and 12lbs from admit - SCr improved from 1.52>1.37 - LVEDP was normal at cath so euvolemic - ARB and diuretics on hold due to bump in SCr>>LVEDP is normal - would send home off ARB due to bumped SCR this admission and recent contrast exposure and transition to Entresto early outpt followup in [facility] if renal function is stable - continue Toprol XL 200mg  daily - start Jardiance 10mg  daily   Persistent Atrial Fibrillation  - he appears to be going in and out of PAF on tele and some atrial tach - On Toprol XL 200mg  daily - Increased Amio to 200mg  daily yesterday - he also had some rapid V pacing on tele (? Tracking at upper rate when in atach)>>will get pacer  interrogated - restart Xarelto   CKD stage IIIa - Creatinine bumped from 1.06 to 1.52 yesterday but improved post cath to 1.37 today - holding ARB, diuretics and spiro (LVEDP normal at cath)  ASCAD -Cath yesterday showed 30% pLAD, 90% Ramus, 30% pLCx and 85% mLCx, occluded RPDA, normal LVEDP and mild PHTN>>medical therapy recommended -continue ASA 81mg  daily, Toprol XL 200mg  daily and statin  HLD -increase statin to high dose Atovastatin 80mg  daily for LDL goal < 55 (LDL was 63 in Nov) -repeat FLP and ALT in 6 weeks   Otherwise managed per primary  - COPD - Pulmonary nodues  - Hypothyroidism  - CML - Abdominal Aortic Aneurysm   I have spent a total of 40 minutes with patient reviewing 2D echo, cardiac cath , telemetry, EKGs, labs and examining patient as well as establishing an assessment and plan that was discussed with the patient.  > 50% of time was spent in direct patient care.    [facility] will sign off.   Medication Recommendations:  ASA 81mg  daily, Amiodarone 200mg  daily, Atorvastatin 80mg  daily, Toprol XL 200mg  daily, Jardiance 10mg  daily and Xarelto 20mg  daily Other recommendations (labs, testing, etc):  BMET 1week Follow up as an outpatient:  [facility] 1 week and Dr. Curt Bears in 6 weeks     For questions or updates, please contact [facility] Please consult [email or web address] for contact info under        Signed, Fransico Him, MD  02/19/2022, 7:56 AM

## 2022-02-19 NOTE — Assessment & Plan Note (Signed)
Body mass index is 23.67 kg/m.  Nutrition Status: Nutrition Problem: Moderate Malnutrition Etiology: chronic illness (COPD) Signs/Symptoms: mild fat depletion, mild muscle depletion Interventions: Ensure Enlive (each supplement provides 350kcal and 20 grams of protein), MVI, Liberalize Diet Patient was seen by the dietitian during hospitalization with recommendations of liberalizing diet and continue to encourage increased oral intake, supplementation.

## 2022-02-19 NOTE — Discharge Summary (Signed)
Physician Discharge Summary  Jason [name]:[id number] DOB: Mar 24, 1946 DOA: 02/15/2022  PCP: Mosie Lukes, MD  Admit date: 02/15/2022 Discharge date: 02/19/2022  Admitted From:  Disposition:    Recommendations for Outpatient Follow-up:  Follow up with PCP in 1-2 weeks Follow-up with cardiology, CHF clinic 1 week Follow-up with cardiology, Dr. Curt Bears 6 weeks We will need repeat fast lipid panel/ALT 6 weeks with increase in atorvastatin Consider outpatient vascular surgery referral for AAA Consider outpatient pulmonology referral for concerns of pulmonary fibrosis/NSIP on imaging with pulmonary nodules Holding ARB/ACE inhibitor, Entresto, diuretics at time of discharge per cardiology due to bump in creatinine. Please obtain BMP in one week to reassess kidney function  Home Health: PT/OT/RN, social work Equipment/Devices: Oxygen, 3 L per nasal cannula  Discharge Condition:  CODE STATUS:  Diet recommendation:   History of present illness:  Jason [name] is a 76 year old male with past medical history significant for chronic combined systolic/diastolic congestive heart failure, COPD, history of CVA, CAD, essential hypertension, dyslipidemia, paroxysmal atrial fibrillation on Xarelto, s/p PPM, long-term smoker who quit in 2017, recent COVID-19 viral pneumonia December 2022 and discharged home on 2 L oxygen who presented to [facility] ED on 2/20 with progressive shortness of breath, worse with exertion and swelling in his feet.  In the ED, patient was noted to be hypoxic with SPO2's in the 70s and placed on a nonrebreather.  Labs notable for hypokalemia, hypomagnesemia, elevated D-dimer, CT at bedtime with findings concerning for possible underlying pulmonary fibrosis/NSIP.  Patient was started on broad-spectrum antibiotics and steroids.  Hospital service consulted for further evaluation and management.  Hospital course:  Assessment and Plan: * Acute on chronic respiratory failure with  hypoxia [facility]) Patient presenting with progressive shortness of breath, etiology likely multifactorial with COPD exacerbation combined with decompensated systolic/diastolic congestive heart failure.  CT angiogram chest negative for pulmonary embolism but findings concerning for pulmonary fibrosis/NSIP.  Longstanding history of tobacco abuse, quit 2017 with recent COVID-19 infection and amiodarone use.  Seen by speech therapy, no concerns for aspiration.  Completed 5-day course of antibiotics/steroids for COPD and diuresis with IV Lasix as below.  Patient's oxygen needs were titrated down but will require 3 L nasal cannula consistently at time of discharge.  Acute on chronic systolic CHF (congestive heart failure) ([place]) Patient with history of CAD, s/p PCI/stents greater than 10 years ago.  Echo 2/21 noted drop in EF from 45-50% down to 30% with severe left ventricular wall motion abnormalities including inferolateral and inferior wall.  Cardiology was consulted and followed during hospital course.  Started on IV diuresis underwent left heart catheterization with findings 30% pLAD, 90% Ramus, 30% pLCx and 85% mLCx, occluded RPDA; and recommended medical management.  Holding ARB/ACE inhibitor on discharge due to bump in creatinine.  Continue atorvastatin 80 mg p.o. daily, Toprol XL 200 mg p.o. daily, Jardiance 10 mg p.o. daily.  Follow-up [facility] clinic 1 week and Dr. Curt Bears 6 weeks.  Acute renal failure superimposed on stage 3b chronic kidney disease ([place])- (present on admission) Patient was started on IV diuresis while hospitalized due to decompensated CHF with bump in creatinine due to aggressive IV diuresis.  Creatinine peaked during hospitalization to 1.5 to.  Creatinine 1.37 at time of discharge.  Recommend BMP in next PCP/specialist visit.  COPD with acute exacerbation ([place])- (present on admission) Continue prednisone on discharge to complete 5-day course of steroid treatment for acute COPD exacerbation.   Patient require 3 L nasal cannula on discharge.  PAF (paroxysmal atrial fibrillation) ([place])- (present on admission) Rate controlled with Amiodarone and Toprol XL, Toprol dose was recently increased from 100 to 200 by Dr. Curt Bears.  Continue Xarelto.  Outpatient follow-up with cardiology.  Coronary artery disease- (present on admission) Follows with cardiology, [name] and Dr. Curt Bears outpatient. Left heart catheterization with findings 30% pLAD, 90% Ramus, 30% pLCx and 85% mLCx, occluded RPDA; and recommended medical management.   Atorvastatin increased to 80 mg p.o. daily for LDL goal less than 55.  Will need repeat lipid panel and ALT in 6 weeks.  Continue aspirin 81 mg p.o. daily.  Infrarenal abdominal aortic aneurysm (AAA) without rupture- (present on admission) Needs imaging every 6 months and outpatient vascular consultation  Hypothyroidism- (present on admission) Continue levothyroxine 25 mcg p.o. daily  CML (chronic myelocytic leukemia) ([place])- (present on admission) Continue Gleevac, outpatient follow-up with medical oncology, Dr. Marin Olp.  Pulmonary nodule less than 1 cm in diameter with moderate to high risk for malignant neoplasm Appears to be stable, also with NSIP and scattered nodularity on CT angiogram chest.  Consider outpatient pulmonology referral for further evaluation.   Malnutrition of moderate degree- (present on admission) Body mass index is 23.67 kg/m.  Nutrition Status: Nutrition Problem: Moderate Malnutrition Etiology: chronic illness (COPD) Signs/Symptoms: mild fat depletion, mild muscle depletion Interventions: Ensure Enlive (each supplement provides 350kcal and 20 grams of protein), MVI, Liberalize Diet Patient was seen by the dietitian during hospitalization with recommendations of liberalizing diet and continue to encourage increased oral intake, supplementation.  Elevated troponin- (present on admission) Etiology likely secondary to type II demand  ischemia in the setting of decompensated CHF as above, although multivessel CAD noted on left heart catheterization with recommendations of medical management per cardiology as above.       Discharge Diagnoses:  Principal Problem:   Acute on chronic respiratory failure with hypoxia (HCC) Active Problems:   Acute on chronic systolic CHF (congestive heart failure) (HCC)   Acute renal failure superimposed on stage 3b chronic kidney disease (HCC)   COPD with acute exacerbation (HCC)   PAF (paroxysmal atrial fibrillation) (HCC)   Coronary artery disease   Infrarenal abdominal aortic aneurysm (AAA) without rupture   Hypothyroidism   CML (chronic myelocytic leukemia) (HCC)   Pulmonary nodule less than 1 cm in diameter with moderate to high risk for malignant neoplasm   Elevated troponin   Malnutrition of moderate degree    Discharge Instructions  Discharge Instructions     Call MD for:  difficulty breathing, headache or visual disturbances   Complete by: As directed    Call MD for:  extreme fatigue   Complete by: As directed    Call MD for:  persistant dizziness or light-headedness   Complete by: As directed    Call MD for:  persistant nausea and vomiting   Complete by: As directed    Call MD for:  severe uncontrolled pain   Complete by: As directed    Call MD for:  temperature >100.4   Complete by: As directed    Diet - low sodium heart healthy   Complete by: As directed    Increase activity slowly   Complete by: As directed       Allergies as of 02/19/2022   No Known Allergies      Medication List     STOP taking these medications    amLODipine 5 MG tablet Commonly known as: NORVASC   losartan 100 MG tablet Commonly known as: COZAAR  TAKE these medications    albuterol 108 (90 Base) MCG/ACT inhaler Commonly known as: VENTOLIN HFA Inhale 2 puffs into the lungs every 6 (six) hours as needed. What changed: reasons to take this   amiodarone 200 MG  tablet Commonly known as: PACERONE Take 1 tablet (200 mg total) by mouth daily. What changed:  how much to take how to take this when to take this additional instructions   Aspirin Low Dose 81 MG EC tablet Generic drug: aspirin Take 1 tablet (81 mg total) by mouth daily. Swallow whole.   atorvastatin 80 MG tablet Commonly known as: Lipitor Take 1 tablet (80 mg total) by mouth daily. What changed:  medication strength how much to take   ferrous sulfate 325 (65 FE) MG EC tablet Take 1 tablet (325 mg total) by mouth 2 (two) times daily. What changed: when to take this   fluticasone 50 MCG/ACT nasal spray Commonly known as: FLONASE Place 2 sprays into both nostrils daily.   imatinib 400 MG tablet Commonly known as: [name] TAKE 1 TABLET (400MG  TOTAL) BY MOUTH DAILY. TAKE WITH A MEAL AND LARGE GLASS OF WATER. What changed: See the new instructions.   Jardiance 10 MG Tabs tablet Generic drug: empagliflozin Take 1 tablet (10 mg total) by mouth daily.   levothyroxine 25 MCG tablet Commonly known as: SYNTHROID Take 1 tablet (25 mcg total) by mouth daily before breakfast.   magnesium oxide 400 MG tablet Commonly known as: MAG-OX Take 1 tablet (400 mg total) by mouth daily. Except Tuesday and Saturday take it BID. What changed:  how much to take when to take this additional instructions   meclizine 25 MG tablet Commonly known as: ANTIVERT TAKE 1 TABLET THREE TIMES DAILY What changed:  when to take this reasons to take this   metoprolol 200 MG 24 hr tablet Commonly known as: TOPROL-XL Take 1 tablet (200 mg total) by mouth daily. Take with or immediately following a meal.   nitroGLYCERIN 0.4 MG SL tablet Commonly known as: NITROSTAT Place 1 tablet (0.4 mg total) under the tongue every 5 (five) minutes as needed for chest pain.   omeprazole 40 MG capsule Commonly known as: PRILOSEC Take 1 capsule (40 mg total) by mouth daily.   ondansetron 4 MG tablet Commonly  known as: ZOFRAN Take 1 tablet (4 mg total) by mouth 2 (two) times daily as needed for nausea or vomiting.   predniSONE 20 MG tablet Commonly known as: DELTASONE Take 2 tablets (40 mg total) by mouth daily with breakfast for 2 days. Start taking on: February 20, 2022   Probiotic 250 MG Caps Take 250 mg by mouth daily.   vitamin B-12 500 MCG tablet Commonly known as: CYANOCOBALAMIN Take 500 mcg by mouth daily.   Xarelto 20 MG Tabs tablet Generic drug: rivaroxaban TAKE 1 TABLET BY MOUTH ONCE DAILY WITH SUPPER What changed:  how much to take how to take this when to take this               Durable Medical Equipment  (From admission, onward)           Start     Ordered   02/19/22 1101  For home use only DME oxygen  Once       Question Answer Comment  Length of Need Lifetime   Mode or (Route) Nasal cannula   Liters per Minute 3   Frequency Continuous (stationary and portable oxygen unit needed)   Oxygen conserving device Yes  Oxygen delivery system Gas      02/19/22 1100            Follow-up Information     Mosie Lukes, MD. Go on 02/24/2022.   Specialty: Family Medicine Why: @2 :40pm Contact information: [place] [address] [phone]         Constance Haw, MD .   Specialty: Cardiology Contact information: [place] [address] [phone]         [facility] Follow up.   Specialty: Home Health Services Why: [place] will contact you with apt times Contact information: [place] [address] [phone]         Mosie Lukes, MD. Schedule an appointment as soon as possible for a visit in 1 week(s).   Specialty: Family Medicine Contact information: [place] [address] [phone]         Constance Haw, MD .   Specialty: Cardiology Contact information: [place] [address] [phone]         Sueanne Margarita, MD .   Specialty: Cardiology Contact information: [address] [place] [address] [phone]         [place]. Go on 03/03/2022.   Specialty: Cardiology Why: Please come to appt on 03/03/2022 at 12 noon in the [facility] at [facility] all medications to appt Entrance C off of [address]. Contact information: [address] [phone] [place]               No Known Allergies  Consultations: Cardiology   Procedures/Studies: CT Angio Chest PE W and/or Wo Contrast  Result Date: 02/15/2022 CLINICAL DATA:  76 year old male with history of acute onset of nonlocalized abdominal pain, hypoxia and increased work of breathing. Additional history of chronic myelocytic leukemia (CML). EXAM: CT ANGIOGRAPHY CHEST CT ABDOMEN AND PELVIS WITH CONTRAST TECHNIQUE: Multidetector CT imaging of the chest was performed using the standard protocol during bolus administration of intravenous contrast. Multiplanar CT image reconstructions and MIPs were obtained to evaluate the vascular anatomy. Multidetector CT imaging of the abdomen and pelvis was performed using the standard protocol during bolus administration of intravenous contrast. RADIATION DOSE REDUCTION: This exam was performed according to the departmental dose-optimization program which includes automated exposure control, adjustment of the mA and/or kV according to patient size and/or use of iterative reconstruction technique. CONTRAST:  171mL OMNIPAQUE IOHEXOL 350 MG/ML SOLN COMPARISON:  CT of the chest, abdomen and pelvis 12/15/2021. FINDINGS: CTA CHEST FINDINGS Cardiovascular: There are no filling defects within the pulmonary arterial tree to suggest pulmonary embolism. Heart size is normal. There is no significant  pericardial fluid, thickening or pericardial calcification. There is aortic atherosclerosis, as well as atherosclerosis of the great vessels of the mediastinum and the coronary arteries, including calcified atherosclerotic plaque in the left main, left anterior descending, left circumflex and right coronary arteries. Left-sided pacemaker device in place with lead tips terminating in the right atrium and right ventricle. Mediastinum/Nodes: No pathologically enlarged mediastinal or hilar lymph nodes. Hilar esophagus is unremarkable in appearance. No axillary lymphadenopathy. Lungs/Pleura: There are some small nodules scattered throughout the left upper lobe, similar to the prior study, largest of which (axial image 42 of series 6) measures 9  x 5 mm (mean diameter of 7 mm). No other new suspicious appearing pulmonary nodules or masses are noted. There are widespread areas of ground-glass attenuation and septal thickening in the lungs bilaterally (right greater than left), with some associated areas of peripheral bronchiolectasis. These findings are unchanged. Mild centrilobular and paraseptal emphysema, most apparent in the lung apices. No confluent consolidative airspace disease. Trace right pleural effusion. No left pleural effusion. Musculoskeletal: There are no aggressive appearing lytic or blastic lesions noted in the visualized portions of the skeleton. Review of the MIP images confirms the above findings. CT ABDOMEN and PELVIS FINDINGS Hepatobiliary: No suspicious cystic or solid hepatic lesions. No intra or extrahepatic biliary ductal dilatation. Status post cholecystectomy. Pancreas: Large coarse calcification in the proximal pancreatic body. No pancreatic mass. No pancreatic ductal dilatation. No peripancreatic fluid collections or inflammatory changes. Spleen: Unremarkable. Adrenals/Urinary Tract: Subcentimeter low-attenuation lesions in both kidneys, too small to characterize, but similar to prior studies  and statistically likely to represent tiny cysts. No aggressive appearing renal lesions. No hydroureteronephrosis. Urinary bladder is normal in appearance. Bilateral adrenal glands are normal in appearance. Stomach/Bowel: The appearance of the stomach is normal. No pathologic dilatation of small bowel or colon. Normal appendix. Vascular/Lymphatic: Aortic atherosclerosis with fusiform aneurysmal dilatation of the infrarenal abdominal aorta which measures up to 4.9 x 4.5 cm (axial image 36 of series 2), with large burden of atheromatous plaque and/or mural thrombus in the aneurysmally dilated portion. No lymphadenopathy noted in the abdomen or pelvis. Reproductive: Prostate gland and seminal vesicles are unremarkable in appearance. Other: Trace volume of ascites.  No pneumoperitoneum. Musculoskeletal: There are no aggressive appearing lytic or blastic lesions noted in the visualized portions of the skeleton. Review of the MIP images confirms the above findings. IMPRESSION: 1. No evidence of pulmonary embolism. 2. No acute findings are noted in the chest, abdomen or pelvis to account for the patient's symptoms. 3. Unusual appearance of the lungs with asymmetrically distributed fibrotic changes (involving the right lung to a greater extent than the left), stable compared to the prior examination, as detailed above. 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 4. Scattered areas of nodularity in the lungs, stable compared to the prior study, favored to be benign, but continued attention on routine follow-up studies is recommended to ensure stability. The largest of these pulmonary nodules in the left upper lobe again demonstrates a mean diameter of 7 mm. 5. Aortic atherosclerosis, in addition to left main and three-vessel coronary artery disease. Fusiform aneurysmal  dilatation of the infrarenal abdominal aorta which measures up to 4.9 x 4.5 cm. Recommend follow-up CT/MR every 6 months and vascular consultation. This recommendation follows ACR consensus guidelines: White Paper of the ACR Incidental Findings Committee II on Vascular Findings. J Am Coll Radiol 2013; 10:789-794. 6. Diffuse bronchial wall thickening with mild centrilobular and paraseptal emphysema; imaging findings suggestive of underlying COPD. 7. Additional incidental findings, as above. Electronically Signed   By: Vinnie Langton M.D.   On: 02/15/2022 10:50   CT ABDOMEN PELVIS W CONTRAST  Result Date: 02/15/2022 CLINICAL DATA:  76 year old male with history of acute onset of nonlocalized abdominal pain, hypoxia and increased work of breathing. Additional history of chronic myelocytic leukemia (CML). EXAM: CT ANGIOGRAPHY CHEST CT ABDOMEN AND PELVIS WITH CONTRAST TECHNIQUE: Multidetector CT imaging of the chest was performed using the standard protocol during bolus administration of intravenous contrast.  Multiplanar CT image reconstructions and MIPs were obtained to evaluate the vascular anatomy. Multidetector CT imaging of the abdomen and pelvis was performed using the standard protocol during bolus administration of intravenous contrast. RADIATION DOSE REDUCTION: This exam was performed according to the departmental dose-optimization program which includes automated exposure control, adjustment of the mA and/or kV according to patient size and/or use of iterative reconstruction technique. CONTRAST:  172mL OMNIPAQUE IOHEXOL 350 MG/ML SOLN COMPARISON:  CT of the chest, abdomen and pelvis 12/15/2021. FINDINGS: CTA CHEST FINDINGS Cardiovascular: There are no filling defects within the pulmonary arterial tree to suggest pulmonary embolism. Heart size is normal. There is no significant pericardial fluid, thickening or pericardial calcification. There is aortic atherosclerosis, as well as atherosclerosis of the  great vessels of the mediastinum and the coronary arteries, including calcified atherosclerotic plaque in the left main, left anterior descending, left circumflex and right coronary arteries. Left-sided pacemaker device in place with lead tips terminating in the right atrium and right ventricle. Mediastinum/Nodes: No pathologically enlarged mediastinal or hilar lymph nodes. Hilar esophagus is unremarkable in appearance. No axillary lymphadenopathy. Lungs/Pleura: There are some small nodules scattered throughout the left upper lobe, similar to the prior study, largest of which (axial image 42 of series 6) measures 9 x 5 mm (mean diameter of 7 mm). No other new suspicious appearing pulmonary nodules or masses are noted. There are widespread areas of ground-glass attenuation and septal thickening in the lungs bilaterally (right greater than left), with some associated areas of peripheral bronchiolectasis. These findings are unchanged. Mild centrilobular and paraseptal emphysema, most apparent in the lung apices. No confluent consolidative airspace disease. Trace right pleural effusion. No left pleural effusion. Musculoskeletal: There are no aggressive appearing lytic or blastic lesions noted in the visualized portions of the skeleton. Review of the MIP images confirms the above findings. CT ABDOMEN and PELVIS FINDINGS Hepatobiliary: No suspicious cystic or solid hepatic lesions. No intra or extrahepatic biliary ductal dilatation. Status post cholecystectomy. Pancreas: Large coarse calcification in the proximal pancreatic body. No pancreatic mass. No pancreatic ductal dilatation. No peripancreatic fluid collections or inflammatory changes. Spleen: Unremarkable. Adrenals/Urinary Tract: Subcentimeter low-attenuation lesions in both kidneys, too small to characterize, but similar to prior studies and statistically likely to represent tiny cysts. No aggressive appearing renal lesions. No hydroureteronephrosis. Urinary  bladder is normal in appearance. Bilateral adrenal glands are normal in appearance. Stomach/Bowel: The appearance of the stomach is normal. No pathologic dilatation of small bowel or colon. Normal appendix. Vascular/Lymphatic: Aortic atherosclerosis with fusiform aneurysmal dilatation of the infrarenal abdominal aorta which measures up to 4.9 x 4.5 cm (axial image 36 of series 2), with large burden of atheromatous plaque and/or mural thrombus in the aneurysmally dilated portion. No lymphadenopathy noted in the abdomen or pelvis. Reproductive: Prostate gland and seminal vesicles are unremarkable in appearance. Other: Trace volume of ascites.  No pneumoperitoneum. Musculoskeletal: There are no aggressive appearing lytic or blastic lesions noted in the visualized portions of the skeleton. Review of the MIP images confirms the above findings. IMPRESSION: 1. No evidence of pulmonary embolism. 2. No acute findings are noted in the chest, abdomen or pelvis to account for the patient's symptoms. 3. Unusual appearance of the lungs with asymmetrically distributed fibrotic changes (involving the right lung to a greater extent than the left), stable compared to the prior examination, as detailed above. Findings are most compatible with an alternative diagnosis (not usual interstitial pneumonia) per current ATS guidelines, favored to reflect fibrotic phase nonspecific interstitial pneumonia (  NSIP). 4. Scattered areas of nodularity in the lungs, stable compared to the prior study, favored to be benign, but continued attention on routine follow-up studies is recommended to ensure stability. The largest of these pulmonary nodules in the left upper lobe again demonstrates a mean diameter of 7 mm. 5. Aortic atherosclerosis, in addition to left main and three-vessel coronary artery disease. Fusiform aneurysmal dilatation of the infrarenal abdominal aorta which measures up to 4.9 x 4.5 cm. Recommend follow-up CT/MR every 6 months and  vascular consultation. This recommendation follows ACR consensus guidelines: White Paper of the ACR Incidental Findings Committee II on Vascular Findings. J Am Coll Radiol 2013; 10:789-794. 6. Diffuse bronchial wall thickening with mild centrilobular and paraseptal emphysema; imaging findings suggestive of underlying COPD. 7. Additional incidental findings, as above. Electronically Signed   By: Vinnie Langton M.D.   On: 02/15/2022 10:50   CARDIAC CATHETERIZATION  Result Date: 02/18/2022   Prox LAD lesion is 30% stenosed.   Lat Ramus lesion is 90% stenosed.   Prox Cx to Mid Cx lesion is 30% stenosed.   Mid Cx lesion is 85% stenosed.   RPDA lesion is 100% stenosed.   LV end diastolic pressure is normal.   Hemodynamic findings consistent with mild pulmonary hypertension. Obstructive CAD. 90% sub branch of trifurcating ramus intermediate. 85% focal LCx stenosis just distal to prior stent. 100% PDA at prior stent with left to right collaterals. Normal/low LV filling pressures. PCWP 3 mm Hg Mild to moderate pulmonary HTN mean 34 mm Hg Fair cardiac output index 2.47. Plan; recommend medical therapy. The lesions in the LCX and ramus involve fairly small vessels and his LV dysfunction is significantly out of proportion to his CAD   DG Chest Portable 1 View  Result Date: 02/15/2022 CLINICAL DATA:  Hypoxia, increased work of breathing EXAM: PORTABLE CHEST 1 VIEW COMPARISON:  Chest radiograph 12/15/2021 FINDINGS: A left chest wall cardiac device is stable. The cardiomediastinal silhouette is stable. There are increased interstitial markings in both lungs, more so on the right, consistent with fibrotic change as seen on prior CT. There is no definite new superimposed focal airspace disease. There is no pleural effusion or pneumothorax There is no acute osseous abnormality. IMPRESSION: Coarse interstitial markings in both lungs consistent with chronic/fibrotic changes, without definite new superimposed focal airspace  disease. Electronically Signed   By: Valetta Mole M.D.   On: 02/15/2022 09:40   DG Swallowing Func-Speech Pathology  Result Date: 02/17/2022 Table formatting from the original result was not included. Objective Swallowing Evaluation: Type of Study: MBS-Modified Barium Swallow Study Completed and documented by Vaughan Sine, SLP Student Supervised and reviewed by Herbie Baltimore MA CCC-SLP Patient Details Name: Jason [name] MRN: [id number] Date of Birth: 14-Jan-1946 Today's Date: 02/17/2022 Time: SLP Start Time (ACUTE ONLY): 77 -SLP Stop Time (ACUTE ONLY): 3329 SLP Time Calculation (min) (ACUTE ONLY): 14 min Past Medical History: Past Medical History: Diagnosis Date  Anemia 05/08/2015  CML (chronic myelocytic leukemia) ([place]) 12/16/2011  Coronary heart disease   CVA (cerebrovascular accident) ([place])   GERD (gastroesophageal reflux disease)   Grief reaction 03/18/2014  H/O tobacco use, presenting hazards to health 02/24/2012  No cigarettes since pacemaker placement. Encouraged ongoing efforts   History of chicken pox   History of kidney stones   Hyperlipidemia   Hypertension   IBS (irritable bowel syndrome) 09/20/2013  Medicare annual wellness visit, subsequent 03/23/2014  PAF (paroxysmal atrial fibrillation) ([facility])   Symptomatic bradycardia   a. symptomatic bradycardia due  to sinus node dysfunction s/p RA and RV Medtronic pacemaker 04/15/16 Past Surgical History: Past Surgical History: Procedure Laterality Date  CATARACT EXTRACTION    CHOLECYSTECTOMY  2003  CORONARY ANGIOPLASTY WITH STENT PLACEMENT  2000  EP IMPLANTABLE DEVICE N/A 04/15/2016  Procedure: Pacemaker Implant;  Surgeon: Evans Lance, MD; Medtronic (serial number [id number]) pacemaker; Laterality: Left No data recorded No data recorded  Recommendations for follow up therapy are one component of a multi-disciplinary discharge planning process, led by the attending physician.  Recommendations may be updated based on patient status, additional functional criteria  and insurance authorization. Assessment / Plan / Recommendation Clinical Impressions 02/17/2022 Clinical Impression Patient was seen for a MBS d/t concerns of chronic silent aspiration. Oral mech exam WFL with adequate labial and lingual ROM and strength observed. MBS revealed that there are no clinical indications of aspiration, penetration or oropharyngeal dysphagia. Mastication, lateralization, and bolus preparation/propulsion WFL across all consistencies/textures. Epiglottic inversion and laryngeal vestibule closure present. Noted some residue at the level of the pyriform sinuses; pt able to clear residue independently. Neither penetration or aspiration was observed in MBS. Noted that pill with thin was effortful to clear; resolved when followed with sips of thin liquids. SLP recommends patient continue regular/thin diet; Patient educated on taking medications with cup of water to ensure they are passing effectively. SLP Visit Diagnosis Dysphagia, unspecified (R13.10) Attention and concentration deficit following -- Frontal lobe and executive function deficit following -- Impact on safety and function No limitations   Treatment Recommendations 02/17/2022 Treatment Recommendations No treatment recommended at this time   Prognosis 02/17/2022 Prognosis for Safe Diet Advancement Good Barriers to Reach Goals -- Barriers/Prognosis Comment -- Diet Recommendations 02/17/2022 SLP Diet Recommendations Regular solids;Thin liquid Liquid Administration via Cup;Straw Medication Administration Whole meds with liquid Compensations -- Postural Changes Remain semi-upright after after feeds/meals (Comment);Seated upright at 90 degrees   Other Recommendations 02/17/2022 Recommended Consults -- Oral Care Recommendations Oral care BID Other Recommendations -- Follow Up Recommendations No SLP follow up Assistance recommended at discharge None Functional Status Assessment Patient has had a recent decline in their functional status and  demonstrates the ability to make significant improvements in function in a reasonable and predictable amount of time. No flowsheet data found.  Oral Phase 02/17/2022 Oral Phase WFL Oral - Pudding Teaspoon -- Oral - Pudding Cup -- Oral - Honey Teaspoon -- Oral - Honey Cup -- Oral - Nectar Teaspoon -- Oral - Nectar Cup -- Oral - Nectar Straw -- Oral - Thin Teaspoon -- Oral - Thin Cup -- Oral - Thin Straw -- Oral - Puree -- Oral - Mech Soft -- Oral - Regular -- Oral - Multi-Consistency -- Oral - Pill -- Oral Phase - Comment --  Pharyngeal Phase 02/17/2022 Pharyngeal Phase WFL Pharyngeal- Pudding Teaspoon -- Pharyngeal -- Pharyngeal- Pudding Cup -- Pharyngeal -- Pharyngeal- Honey Teaspoon -- Pharyngeal -- Pharyngeal- Honey Cup -- Pharyngeal -- Pharyngeal- Nectar Teaspoon -- Pharyngeal -- Pharyngeal- Nectar Cup -- Pharyngeal -- Pharyngeal- Nectar Straw -- Pharyngeal -- Pharyngeal- Thin Teaspoon -- Pharyngeal -- Pharyngeal- Thin Cup -- Pharyngeal -- Pharyngeal- Thin Straw -- Pharyngeal -- Pharyngeal- Puree -- Pharyngeal -- Pharyngeal- Mechanical Soft -- Pharyngeal -- Pharyngeal- Regular -- Pharyngeal -- Pharyngeal- Multi-consistency -- Pharyngeal -- Pharyngeal- Pill -- Pharyngeal -- Pharyngeal Comment --  No flowsheet data found. DeBlois, Katherene Ponto 02/17/2022, 1:18 PM                     ECHOCARDIOGRAM COMPLETE  Result Date:  02/16/2022    ECHOCARDIOGRAM REPORT   Patient Name:   Jason [name] Date of Exam: 02/16/2022 Medical Rec #:  [id number]     Height:       68.0 in Accession #:    [id number]    Weight:       168.0 lb Date of Birth:  1946/05/18     BSA:          1.898 m Patient Age:    62 years      BP:           131/76 mmHg Patient Gender: M             HR:           71 bpm. Exam Location:  Inpatient Procedure: 2D Echo, Cardiac Doppler, Color Doppler and Strain Analysis Indications:    Dyspnea  History:        Patient has prior history of Echocardiogram examinations, most                 recent 09/16/2021. CAD,  Stroke, Arrythmias:Atrial Fibrillation                 and Bradycardia; Risk Factors:Hypertension. 04/15/16 pacemaker.  Sonographer:    Luisa Hart RDCS Referring Phys: [place]  1. Left ventricular ejection fraction, by estimation, is 30 to 35%. The left ventricle has moderately decreased function. The left ventricle demonstrates global hypokinesis. Left ventricular diastolic parameters are consistent with Grade I diastolic dysfunction (impaired relaxation). There is severe hypokinesis of the left ventricular, entire inferolateral wall and inferior wall.  2. Right ventricular systolic function is mildly reduced. The right ventricular size is moderately enlarged.  3. Left atrial size was mildly dilated.  4. Right atrial size was mildly dilated.  5. The mitral valve is normal in structure. No evidence of mitral valve regurgitation.  6. Tricuspid valve regurgitation is moderate.  7. The aortic valve is tricuspid. There is mild calcification of the aortic valve. There is mild thickening of the aortic valve. Aortic valve regurgitation is mild to moderate. Aortic valve sclerosis/calcification is present, without any evidence of aortic stenosis.  8. There is borderline dilatation of the ascending aorta, measuring 38 mm. Comparison(s): The left ventricular function is worsened. The left ventricular wall motion abnormalities are unchanged. FINDINGS  Left Ventricle: Left ventricular ejection fraction, by estimation, is 30 to 35%. The left ventricle has moderately decreased function. The left ventricle demonstrates global hypokinesis. Severe hypokinesis of the left ventricular, entire inferolateral wall and inferior wall. The left ventricular internal cavity size was normal in size. There is no left ventricular hypertrophy. Left ventricular diastolic parameters are consistent with Grade I diastolic dysfunction (impaired relaxation). Indeterminate filling pressures. Right Ventricle: The right ventricular  size is moderately enlarged. No increase in right ventricular wall thickness. Right ventricular systolic function is mildly reduced. Left Atrium: Left atrial size was mildly dilated. Right Atrium: Right atrial size was mildly dilated. Prominent Eustachian valve. Pericardium: There is no evidence of pericardial effusion. Mitral Valve: The mitral valve is normal in structure. No evidence of mitral valve regurgitation. Tricuspid Valve: The tricuspid valve is normal in structure. Tricuspid valve regurgitation is moderate. Aortic Valve: The aortic valve is tricuspid. There is mild calcification of the aortic valve. There is mild thickening of the aortic valve. Aortic valve regurgitation is mild to moderate. Aortic regurgitation PHT measures 721 msec. Aortic valve sclerosis/calcification is present, without any evidence of aortic stenosis. Aortic valve mean  gradient measures 6.5 mmHg. Aortic valve peak gradient measures 12.5 mmHg. Aortic valve area, by VTI measures 4.54 cm. Pulmonic Valve: The pulmonic valve was normal in structure. Pulmonic valve regurgitation is not visualized. Aorta: The aortic root is normal in size and structure. There is borderline dilatation of the ascending aorta, measuring 38 mm. IAS/Shunts: No atrial level shunt detected by color flow Doppler. Additional Comments: A device lead is visualized in the right ventricle.  LEFT VENTRICLE PLAX 2D LVOT diam:     3.20 cm      Diastology LV SV:         151          LV e' medial:    4.13 cm/s LV SV Index:   79           LV E/e' medial:  11.5 LVOT Area:     8.04 cm     LV e' lateral:   2.94 cm/s                             LV E/e' lateral: 16.2  LV Volumes (MOD)            2D Longitudinal Strain LV vol d, MOD A2C: 130.0 ml 2D Strain GLS Avg:     -12.2 % LV vol d, MOD A4C: 149.0 ml LV vol s, MOD A2C: 60.8 ml LV vol s, MOD A4C: 95.5 ml LV SV MOD A2C:     69.2 ml LV SV MOD A4C:     149.0 ml LV SV MOD BP:      63.4 ml RIGHT VENTRICLE RV Basal diam:  5.00 cm RV  Mid diam:    3.10 cm RV S prime:     11.70 cm/s TAPSE (M-mode): 1.8 cm LEFT ATRIUM           Index        RIGHT ATRIUM           Index LA diam:      3.30 cm 1.74 cm/m   RA Area:     25.00 cm LA Vol (A2C): 46.2 ml 24.34 ml/m  RA Volume:   95.80 ml  50.48 ml/m LA Vol (A4C): 55.8 ml 29.40 ml/m  AORTIC VALVE                     PULMONIC VALVE AV Area (Vmax):    4.43 cm      PV Vmax:       0.73 m/s AV Area (Vmean):   4.21 cm      PV Vmean:      49.200 cm/s AV Area (VTI):     4.54 cm      PV VTI:        0.176 m AV Vmax:           176.50 cm/s   PV Peak grad:  2.1 mmHg AV Vmean:          117.000 cm/s  PV Mean grad:  1.0 mmHg AV VTI:            0.332 m AV Peak Grad:      12.5 mmHg AV Mean Grad:      6.5 mmHg LVOT Vmax:         97.15 cm/s LVOT Vmean:        61.250 cm/s LVOT VTI:          0.188 m LVOT/AV VTI ratio:  0.54 AI PHT:            721 msec  AORTA Ao Asc diam: 3.80 cm MITRAL VALVE                TRICUSPID VALVE MV Area (PHT): 3.99 cm     TR Peak grad:   62.7 mmHg MV Decel Time: 190 msec     TR Vmax:        396.00 cm/s MV E velocity: 47.50 cm/s MV A velocity: 107.00 cm/s  SHUNTS MV E/A ratio:  0.44         Systemic VTI:  0.19 m                             Systemic Diam: 3.20 cm Dani Gobble Croitoru MD Electronically signed by Sanda Klein MD Signature Date/Time: 02/16/2022/3:02:16 PM    Final      Subjective: Patient seen examined at bedside, resting comfortably.  No specific complaints this morning.  Seen by cardiology now signed off and okay for discharge home.  No other questions or concerns at this time.  Denies headache, no dizziness, no chest pain, palpitations, no shortness of breath more than his typical baseline, no abdominal pain, no fever/chills/night sweats, no nausea/vomiting/diarrhea, no weakness, no fatigue, no paresthesias.  No acute events overnight per nursing staff.  Discharge Exam: Vitals:   02/19/22 0441 02/19/22 1002  BP: 127/70 120/74  Pulse: 62 94  Resp: 20   Temp: 97.6 F (36.4  C) (!) 97.4 F (36.3 C)  SpO2: 96% 91%   Vitals:   02/18/22 1531 02/18/22 1931 02/19/22 0441 02/19/22 1002  BP: 130/72 118/73 127/70 120/74  Pulse: 67 60 62 94  Resp: 20 20 20    Temp:  (!) 97.5 F (36.4 C) 97.6 F (36.4 C) (!) 97.4 F (36.3 C)  TempSrc:  Oral Oral Oral  SpO2: 98% 96% 96% 91%  Weight:   70.6 kg   Height:        Physical Exam: GEN: NAD, alert and oriented x 3, elderly in appearance HEENT: NCAT, PERRL, EOMI, sclera clear, MMM PULM: CTAB w/o wheezes/crackles, normal respiratory effort, on 3 L nasal cannula CV: RRR w/o M/G/R GI: abd soft, NTND, NABS, no R/G/M MSK: no peripheral edema, muscle strength globally intact 5/5 bilateral upper/lower extremities NEURO: CN II-XII intact, no focal deficits, sensation to light touch intact PSYCH: normal mood/affect Integumentary: dry/intact, no rashes or wounds    The results of significant diagnostics from this hospitalization (including imaging, microbiology, ancillary and laboratory) are listed below for reference.     Microbiology: Recent Results (from the past 240 hour(s))  Resp Panel by RT-PCR (Flu A&B, Covid) Nasopharyngeal Swab     Status: None   Collection Time: 02/15/22  9:45 AM   Specimen: Nasopharyngeal Swab; Nasopharyngeal(NP) swabs in vial transport medium  Result Value Ref Range Status   SARS Coronavirus 2 by RT PCR NEGATIVE NEGATIVE Final    Comment: (NOTE) SARS-CoV-2 target nucleic acids are NOT DETECTED.  The SARS-CoV-2 RNA is generally detectable in upper respiratory specimens during the acute phase of infection. The lowest concentration of SARS-CoV-2 viral copies this assay can detect is 138 copies/mL. A negative result does not preclude SARS-Cov-2 infection and should not be used as the sole basis for treatment or other patient management decisions. A negative result may occur with  improper specimen collection/handling, submission of specimen other than nasopharyngeal swab, presence of viral  mutation(s) within the areas targeted by this assay, and inadequate number of viral copies(<138 copies/mL). A negative result must be combined with clinical observations, patient history, and epidemiological information. The expected result is Negative.  Fact Sheet for Patients:  EntrepreneurPulse.com.au  Fact Sheet for Healthcare Providers:  IncredibleEmployment.be  This test is no t yet approved or cleared by the [place] FDA and  has been authorized for detection and/or diagnosis of SARS-CoV-2 by FDA under an Emergency Use Authorization (EUA). This EUA will remain  in effect (meaning this test can be used) for the duration of the COVID-19 declaration under Section 564(b)(1) of the Act, 21 U.S.C.section 360bbb-3(b)(1), unless the authorization is terminated  or revoked sooner.       Influenza A by PCR NEGATIVE NEGATIVE Final   Influenza B by PCR NEGATIVE NEGATIVE Final    Comment: (NOTE) The Xpert Xpress SARS-CoV-2/FLU/RSV plus assay is intended as an aid in the diagnosis of influenza from Nasopharyngeal swab specimens and should not be used as a sole basis for treatment. Nasal washings and aspirates are unacceptable for Xpert Xpress SARS-CoV-2/FLU/RSV testing.  Fact Sheet for Patients: EntrepreneurPulse.com.au  Fact Sheet for Healthcare Providers: IncredibleEmployment.be  This test is not yet approved or cleared by the [place] FDA and has been authorized for detection and/or diagnosis of SARS-CoV-2 by FDA under an Emergency Use Authorization (EUA). This EUA will remain in effect (meaning this test can be used) for the duration of the COVID-19 declaration under Section 564(b)(1) of the Act, 21 U.S.C. section 360bbb-3(b)(1), unless the authorization is terminated or revoked.  Performed at [facility], [address] [place], [place] [address]      Labs: BNP (last 3  results) Recent Labs    07/31/21 1715 12/15/21 1050  BNP 318.5* 026.3*   Basic Metabolic Panel: Recent Labs  Lab 02/15/22 0917 02/15/22 0925 02/16/22 0102 02/17/22 0350 02/18/22 0439 02/18/22 1517 02/18/22 1518 02/18/22 1522 02/19/22 0611  NA 139   < > 139 137 137 141 142 141   140 141  K 3.1*   < > 4.2 4.6 4.5 3.3* 3.3* 3.5   3.5 4.1  CL 109  --  108 105 101  --   --   --  105  CO2 20*  --  24 22 27   --   --   --  28  GLUCOSE 101*  --  179* 104* 101*  --   --   --  87  BUN 7*  --  9 18 29*  --   --   --  27*  CREATININE 1.12  --  1.12 1.06 1.52*  --   --   --  1.37*  CALCIUM 8.6*  --  8.6* 9.2 9.4  --   --   --  9.1  MG 1.4*  --   --   --   --   --   --   --   --    < > = values in this interval not displayed.   Liver Function Tests: Recent Labs  Lab 02/15/22 0917  AST 21  ALT 11  ALKPHOS 73  BILITOT 1.1  PROT 6.9  ALBUMIN 3.5   Recent Labs  Lab 02/15/22 0917  LIPASE 67*   No results for input(s): AMMONIA in the last 168 hours. CBC: Recent Labs  Lab 02/15/22 0917 02/15/22 0925 02/16/22 0102 02/17/22 0350 02/18/22 0439 02/18/22 1517 02/18/22 1518 02/18/22 1522 02/19/22 0611  WBC  9.4  --  4.1 15.4* 15.2*  --   --   --  13.3*  NEUTROABS 7.5  --   --   --   --   --   --   --   --   HGB 14.8   < > 12.4* 12.1* 13.2 13.9 13.6 14.6   14.3 13.5  HCT 44.6   < > 37.8* 36.9* 41.3 41.0 40.0 43.0   42.0 42.4  MCV 92.5  --  92.4 93.7 93.9  --   --   --  92.8  PLT 253  --  201 215 275  --   --   --  254   < > = values in this interval not displayed.   Cardiac Enzymes: No results for input(s): CKTOTAL, CKMB, CKMBINDEX, TROPONINI in the last 168 hours. BNP: Invalid input(s): POCBNP CBG: No results for input(s): GLUCAP in the last 168 hours. D-Dimer No results for input(s): DDIMER in the last 72 hours. Hgb A1c No results for input(s): HGBA1C in the last 72 hours. Lipid Profile No results for input(s): CHOL, HDL, LDLCALC, TRIG, CHOLHDL, LDLDIRECT in the last  72 hours. Thyroid function studies No results for input(s): TSH, T4TOTAL, T3FREE, THYROIDAB in the last 72 hours.  Invalid input(s): FREET3 Anemia work up No results for input(s): VITAMINB12, FOLATE, FERRITIN, TIBC, IRON, RETICCTPCT in the last 72 hours. Urinalysis    Component Value Date/Time   COLORURINE YELLOW 07/31/2021 1845   APPEARANCEUR CLEAR 07/31/2021 1845   LABSPEC <1.005 (L) 07/31/2021 1845   PHURINE 6.5 07/31/2021 1845   GLUCOSEU NEGATIVE 07/31/2021 1845   HGBUR NEGATIVE 07/31/2021 1845   BILIRUBINUR NEGATIVE 07/31/2021 1845   KETONESUR NEGATIVE 07/31/2021 1845   PROTEINUR NEGATIVE 07/31/2021 1845   UROBILINOGEN 0.2 06/27/2014 1315   NITRITE NEGATIVE 07/31/2021 1845   LEUKOCYTESUR NEGATIVE 07/31/2021 1845   Sepsis Labs Invalid input(s): PROCALCITONIN,  WBC,  LACTICIDVEN Microbiology Recent Results (from the past 240 hour(s))  Resp Panel by RT-PCR (Flu A&B, Covid) Nasopharyngeal Swab     Status: None   Collection Time: 02/15/22  9:45 AM   Specimen: Nasopharyngeal Swab; Nasopharyngeal(NP) swabs in vial transport medium  Result Value Ref Range Status   SARS Coronavirus 2 by RT PCR NEGATIVE NEGATIVE Final    Comment: (NOTE) SARS-CoV-2 target nucleic acids are NOT DETECTED.  The SARS-CoV-2 RNA is generally detectable in upper respiratory specimens during the acute phase of infection. The lowest concentration of SARS-CoV-2 viral copies this assay can detect is 138 copies/mL. A negative result does not preclude SARS-Cov-2 infection and should not be used as the sole basis for treatment or other patient management decisions. A negative result may occur with  improper specimen collection/handling, submission of specimen other than nasopharyngeal swab, presence of viral mutation(s) within the areas targeted by this assay, and inadequate number of viral copies(<138 copies/mL). A negative result must be combined with clinical observations, patient history, and  epidemiological information. The expected result is Negative.  Fact Sheet for Patients:  EntrepreneurPulse.com.au  Fact Sheet for Healthcare Providers:  IncredibleEmployment.be  This test is no t yet approved or cleared by the [place] FDA and  has been authorized for detection and/or diagnosis of SARS-CoV-2 by FDA under an Emergency Use Authorization (EUA). This EUA will remain  in effect (meaning this test can be used) for the duration of the COVID-19 declaration under Section 564(b)(1) of the Act, 21 U.S.C.section 360bbb-3(b)(1), unless the authorization is terminated  or revoked sooner.  Influenza A by PCR NEGATIVE NEGATIVE Final   Influenza B by PCR NEGATIVE NEGATIVE Final    Comment: (NOTE) The Xpert Xpress SARS-CoV-2/FLU/RSV plus assay is intended as an aid in the diagnosis of influenza from Nasopharyngeal swab specimens and should not be used as a sole basis for treatment. Nasal washings and aspirates are unacceptable for Xpert Xpress SARS-CoV-2/FLU/RSV testing.  Fact Sheet for Patients: EntrepreneurPulse.com.au  Fact Sheet for Healthcare Providers: IncredibleEmployment.be  This test is not yet approved or cleared by the [place] FDA and has been authorized for detection and/or diagnosis of SARS-CoV-2 by FDA under an Emergency Use Authorization (EUA). This EUA will remain in effect (meaning this test can be used) for the duration of the COVID-19 declaration under Section 564(b)(1) of the Act, 21 U.S.C. section 360bbb-3(b)(1), unless the authorization is terminated or revoked.  Performed at [facility], [address] [place], [place] [address]      Time coordinating discharge: Over 30 minutes  SIGNED:   Donnamarie Poag [place] DO  [facility] 02/19/2022, 2:19 PM

## 2022-02-19 NOTE — Progress Notes (Signed)
Heart Failure Nurse Navigator Progress Note  Patient screened for HF [facility] appropriateness.  I have scheduled an appt for 3/8 at 12:00.  I will add appt to discharge navigator.

## 2022-02-22 ENCOUNTER — Telehealth: Payer: Self-pay

## 2022-02-22 NOTE — Telephone Encounter (Signed)
Transition Care Management Unsuccessful Follow-up Telephone Call  Date of discharge and from where:  224/2023-[place]  Attempts:  1st Attempt  Reason for unsuccessful TCM follow-up call:  No answer/busy

## 2022-02-22 NOTE — Telephone Encounter (Signed)
Patient called back, will be home the rest of the day at [phone]

## 2022-02-23 NOTE — Telephone Encounter (Signed)
Transition Care Management Follow-up Telephone Call Date of discharge and from where: 02/19/2022-[place] How have you been since you were released from the hospital? Doing pretty good Any questions or concerns? No  Items Reviewed: Did the pt receive and understand the discharge instructions provided? Yes  Medications obtained and verified? Yes  Other? Yes  Any new allergies since your discharge? No  Dietary orders reviewed? Yes Do you have support at home? Yes   Home Care and Equipment/Supplies: Were home health services ordered? yes If so, what is the name of the agency? [facility]  Has the agency set up a time to come to the patient's home? Yes but patient declined the home health services Were any new equipment or medical supplies ordered?  No What is the name of the medical supply agency? N/a Were you able to get the supplies/equipment? not applicable Do you have any questions related to the use of the equipment or supplies? N/a  Functional Questionnaire: (I = Independent and D = Dependent) ADLs: I with assistance  Bathing/Dressing- I  Meal Prep- I with assistance  Eating- I  Maintaining continence- I  Transferring/Ambulation- I  Managing Meds- I with assistance  Follow up appointments reviewed:  PCP Hospital f/u appt confirmed? Yes  Scheduled to see Caleen Jobs on 02/24/2022 @ 2:40. [facility] f/u appt confirmed? Yes  Scheduled to see [place] on 03/03/2022 @ 12:00. Are transportation arrangements needed? No  If their condition worsens, is the pt aware to call PCP or go to the Emergency Dept.? Yes Was the patient provided with contact information for the PCP's office or ED? Yes Was to pt encouraged to call back with questions or concerns? Yes

## 2022-02-24 ENCOUNTER — Other Ambulatory Visit (HOSPITAL_COMMUNITY): Payer: Self-pay

## 2022-02-24 ENCOUNTER — Ambulatory Visit (INDEPENDENT_AMBULATORY_CARE_PROVIDER_SITE_OTHER): Payer: Medicare Other | Admitting: Family Medicine

## 2022-02-24 ENCOUNTER — Telehealth (HOSPITAL_COMMUNITY): Payer: Self-pay

## 2022-02-24 ENCOUNTER — Encounter: Payer: Self-pay | Admitting: Family Medicine

## 2022-02-24 VITALS — BP 112/54 | HR 73 | Ht 68.0 in | Wt 176.6 lb

## 2022-02-24 DIAGNOSIS — I5023 Acute on chronic systolic (congestive) heart failure: Secondary | ICD-10-CM

## 2022-02-24 DIAGNOSIS — N179 Acute kidney failure, unspecified: Secondary | ICD-10-CM

## 2022-02-24 DIAGNOSIS — N1832 Chronic kidney disease, stage 3b: Secondary | ICD-10-CM

## 2022-02-24 DIAGNOSIS — J449 Chronic obstructive pulmonary disease, unspecified: Secondary | ICD-10-CM

## 2022-02-24 DIAGNOSIS — I714 Abdominal aortic aneurysm, without rupture, unspecified: Secondary | ICD-10-CM

## 2022-02-24 NOTE — Telephone Encounter (Signed)
Transitions of Care Pharmacy  ° °Call attempted for a pharmacy transitions of care follow-up. HIPAA appropriate voicemail was left with call back information provided.  ° °Call attempt #1. Will follow-up in 2-3 days.  °  °

## 2022-02-24 NOTE — Progress Notes (Signed)
Acute Office Visit  Subjective:    Patient ID: Jason [name], male    DOB: January 03, 1946, 76 y.o.   MRN: [id number]  CC: hospital follow-up    HPI. Patient is in today for hospital follow-up   02/15/22 ED visit (note reviewed): Patient was admitted to the [facility] ED with progressive shortness of breath worse with exertion and lower extremity edema.  He was hypoxic with oxygen saturation in 70s placed on nonrebreather.  Labs showed hypokalemia, hypomagnesemia, elevated D-dimer, CT concerning for possible underlying pulmonary fibrosis.  He was on steroids and antibiotics for COPD exacerbation.  He was diuresed with IV Lasix to treat his CHF exacerbation.  At time of discharge he was on 3 L nasal cannula.  He did have a cardiac cath while in the hospital which showed 30% proximal LAD, 90% ramus, 30% proximal left circumflex, 85% mid left circumflex, occluded RPDA -medical management was recommended.  They had to hold ARB/ACE at discharge due to creatinine level.  He continued on atorvastatin 80 mg daily, Toprol-XL 200 mg daily, Jardiance 10 mg daily.  He was instructed to follow-up with heart failure clinic and cardiology. Creatinine peaked at 1.5 and was down to 1.37 at discharge (recommend BMP at next follow-up). Atorvastatin increased during hospitalization and recommend repeat lipids/ALT in 6 weeks (should line up w cardiologist appointment). AAA with recommended vascular consult and routine imaging.  Pulmonary nodule - follow-up with pulmonology.  CML - following with oncology outpatient  Today he reports: -Feeling good overall, still not quite 100% but making progress.  -Has not been weighing himself (last hospital weights were 155-162 lbs). Today 176 lbs. Granddaughter reports he had gone without eating for awhile, but is now eating good.  -Denies any swelling, chest pain, dyspnea, wheezing, fevers, rashes, GI/GU symptoms. Reports breathing is getting better. Will occasionally take off his O2  for brief periods. They have been monitoring with pulse Ox at home.  -Home health set up, but hasn't come by yet. Feels like he doesn't need it. Grandchildren live with him to take care of him.      Past Medical History:  Diagnosis Date   Anemia 05/08/2015   CML (chronic myelocytic leukemia) ([place]) 12/16/2011   Coronary heart disease    CVA (cerebrovascular accident) ([place])    GERD (gastroesophageal reflux disease)    Grief reaction 03/18/2014   H/O tobacco use, presenting hazards to health 02/24/2012   No cigarettes since pacemaker placement. Encouraged ongoing efforts    History of chicken pox    History of kidney stones    Hyperlipidemia    Hypertension    IBS (irritable bowel syndrome) 09/20/2013   Medicare annual wellness visit, subsequent 03/23/2014   PAF (paroxysmal atrial fibrillation) (HCC)    Symptomatic bradycardia    a. symptomatic bradycardia due to sinus node dysfunction s/p RA and RV Medtronic pacemaker 04/15/16    Past Surgical History:  Procedure Laterality Date   CATARACT EXTRACTION     CHOLECYSTECTOMY  2003   CORONARY ANGIOPLASTY WITH STENT PLACEMENT  2000   EP IMPLANTABLE DEVICE N/A 04/15/2016   Procedure: Pacemaker Implant;  Surgeon: Evans Lance, MD; Medtronic (serial number [id number]) pacemaker; Laterality: Left   RIGHT/LEFT HEART CATH AND CORONARY ANGIOGRAPHY N/A 02/18/2022   Procedure: RIGHT/LEFT HEART CATH AND CORONARY ANGIOGRAPHY;  Surgeon: Martinique, Peter M, MD;  Location: [facility];  Service: Cardiovascular;  Laterality: N/A;    Family History  Problem Relation Age of Onset   Arthritis  Mother    Hypertension Mother    Heart disease Mother        Rheumatic fever   Rheumatic fever Mother    Other Mother        brain tumor   Cancer Mother        leukemia   Hypertension Father    Alcohol abuse Father    Diabetes Son    Colon cancer Neg Hx    Breast cancer Neg Hx    Prostate cancer Neg Hx     Social History   Socioeconomic History    Marital status: Widowed    Spouse name: Not on file   Number of children: 3   Years of education: Not on file   Highest education level: Not on file  Occupational History   Occupation: Retired    Comment: Retired  Tobacco Use   Smoking status: Former    Packs/day: 0.50    Years: 42.00    Pack years: 21.00    Types: Cigarettes    Start date: 10/30/1967    Quit date: 04/14/2016    Years since quitting: 5.8   Smokeless tobacco: Never  Vaping Use   Vaping Use: Never used  Substance and Sexual Activity   Alcohol use: No    Alcohol/week: 0.0 standard drinks    Comment: Rare   Drug use: No   Sexual activity: Never    Comment: wife died in 01/24/23 married 7 years, grandson livew with patient  Other Topics Concern   Not on file  Social History Narrative   Not on file   Social Determinants of Health   Financial Resource Strain: Low Risk    Difficulty of Paying Living Expenses: Not hard at all  Food Insecurity: No Food Insecurity   Worried About Charity fundraiser in the Last Year: Never true   [place] in the Last Year: Never true  Transportation Needs: No Transportation Needs   Lack of Transportation (Medical): No   Lack of Transportation (Non-Medical): No  Physical Activity: Inactive   Days of Exercise per Week: 0 days   Minutes of Exercise per Session: 0 min  Stress: No Stress Concern Present   Feeling of Stress : Not at all  Social Connections: Moderately Isolated   Frequency of Communication with Friends and Family: More than three times a week   Frequency of Social Gatherings with Friends and Family: Once a week   Attends Religious Services: 1 to 4 times per year   Active Member of [employer] or Organizations: No   Attends Archivist Meetings: Never   Marital Status: Widowed  Human resources officer Violence: Not At Risk   Fear of Current or Ex-Partner: No   Emotionally Abused: No   Physically Abused: No   Sexually Abused: No    Outpatient Medications Prior  to Visit  Medication Sig Dispense Refill   albuterol (VENTOLIN HFA) 108 (90 Base) MCG/ACT inhaler Inhale 2 puffs into the lungs every 6 (six) hours as needed. (Patient taking differently: Inhale 2 puffs into the lungs every 6 (six) hours as needed for shortness of breath.) 18 g 0   amiodarone (PACERONE) 200 MG tablet Take 1 tablet (200 mg total) by mouth daily. 30 tablet 2   aspirin 81 MG EC tablet Take 1 tablet (81 mg total) by mouth daily. Swallow whole. 30 tablet 2   atorvastatin (LIPITOR) 80 MG tablet Take 1 tablet (80 mg total) by mouth daily. 30 tablet 2  empagliflozin (JARDIANCE) 10 MG TABS tablet Take 1 tablet (10 mg total) by mouth daily. 30 tablet 2   ferrous sulfate 325 (65 FE) MG EC tablet Take 1 tablet (325 mg total) by mouth 2 (two) times daily. (Patient taking differently: Take 325 mg by mouth daily.) 60 tablet 11   fluticasone (FLONASE) 50 MCG/ACT nasal spray Place 2 sprays into both nostrils daily. 48 g 3   imatinib ([name]) 400 MG tablet TAKE 1 TABLET (400MG TOTAL) BY MOUTH DAILY. TAKE WITH A MEAL AND LARGE GLASS OF WATER. (Patient taking differently: Take 400 mg by mouth daily.) 30 tablet 6   levothyroxine (SYNTHROID) 25 MCG tablet Take 1 tablet (25 mcg total) by mouth daily before breakfast. 30 tablet 2   magnesium oxide (MAG-OX) 400 MG tablet Take 1 tablet (400 mg total) by mouth daily. Except Tuesday and Saturday take it BID. (Patient taking differently: Take 400-800 mg by mouth See admin instructions. Take one tablet by mouth every day except on Tuesdays and Saturdays take two tablets by mouth per patient) 30 tablet 2   meclizine (ANTIVERT) 25 MG tablet TAKE 1 TABLET THREE TIMES DAILY (Patient taking differently: Take 25 mg by mouth 3 (three) times daily as needed for dizziness.) 90 tablet 0   metoprolol succinate (TOPROL-XL) 200 MG 24 hr tablet Take 1 tablet (200 mg total) by mouth daily. Take with or immediately following a meal. 90 tablet 3   nitroGLYCERIN (NITROSTAT) 0.4 MG  SL tablet Place 1 tablet (0.4 mg total) under the tongue every 5 (five) minutes as needed for chest pain. 25 tablet 1   omeprazole (PRILOSEC) 40 MG capsule Take 1 capsule (40 mg total) by mouth daily. 90 capsule 2   ondansetron (ZOFRAN) 4 MG tablet Take 1 tablet (4 mg total) by mouth 2 (two) times daily as needed for nausea or vomiting. 180 tablet 1   rivaroxaban (XARELTO) 20 MG TABS tablet TAKE 1 TABLET BY MOUTH ONCE DAILY WITH SUPPER (Patient taking differently: 20 mg daily.) 90 tablet 1   Saccharomyces boulardii (PROBIOTIC) 250 MG CAPS Take 250 mg by mouth daily. 90 capsule 1   vitamin B-12 (CYANOCOBALAMIN) 500 MCG tablet Take 500 mcg by mouth daily.     Facility-Administered Medications Prior to Visit  Medication Dose Route Frequency Provider Last Rate Last Admin   cyanocobalamin ((VITAMIN B-12)) injection 1,000 mcg  1,000 mcg Intramuscular Q30 days Mosie Lukes, MD   1,000 mcg at 07/15/20 6599    No Known Allergies  Review of Systems All review of systems negative except what is listed in the HPI     Objective:    Physical Exam Vitals reviewed.  Constitutional:      Appearance: Normal appearance.  HENT:     Head: Normocephalic and atraumatic.  Cardiovascular:     Rate and Rhythm: Normal rate and regular rhythm.  Pulmonary:     Effort: Pulmonary effort is normal.     Breath sounds: Normal breath sounds.  Musculoskeletal:     Cervical back: Normal range of motion and neck supple.     Comments: BLE +1 edema  Skin:    Capillary Refill: Capillary refill takes less than 2 seconds.  Neurological:     General: No focal deficit present.     Mental Status: He is alert and oriented to person, place, and time. Mental status is at baseline.  Psychiatric:        Mood and Affect: Mood normal.  Behavior: Behavior normal.        Thought Content: Thought content normal.        Judgment: Judgment normal.    BP (!) 112/54    Pulse 73    Ht _0  (1.727 m)    Wt 176 lb 9.6 oz  (80.1 kg)    SpO2 (!) 87%    BMI 26.85 kg/m  Wt Readings from Last 3 Encounters:  02/24/22 176 lb 9.6 oz (80.1 kg)  02/19/22 155 lb 11.2 oz (70.6 kg)  01/01/22 172 lb (78 kg)    Health Maintenance Due  Topic Date Due   COVID-19 Vaccine (1) Never done   Zoster Vaccines- Shingrix (1 of 2) Never done   COLONOSCOPY (Pts 45-19yr Insurance coverage will need to be confirmed)  09/01/2018    There are no preventive care reminders to display for this patient.   Lab Results  Component Value Date   TSH 13.891 (H) 12/15/2021   Lab Results  Component Value Date   WBC 13.3 (H) 02/19/2022   HGB 13.5 02/19/2022   HCT 42.4 02/19/2022   MCV 92.8 02/19/2022   PLT 254 02/19/2022   Lab Results  Component Value Date   NA 141 02/19/2022   K 4.1 02/19/2022   CHLORIDE 108 11/26/2016   CO2 28 02/19/2022   GLUCOSE 87 02/19/2022   BUN 27 (H) 02/19/2022   CREATININE 1.37 (H) 02/19/2022   BILITOT 1.1 02/15/2022   ALKPHOS 73 02/15/2022   AST 21 02/15/2022   ALT 11 02/15/2022   PROT 6.9 02/15/2022   ALBUMIN 3.5 02/15/2022   CALCIUM 9.1 02/19/2022   ANIONGAP 8 02/19/2022   EGFR 74 (L) 11/26/2016   GFR 60.54 11/12/2021   Lab Results  Component Value Date   CHOL 118 11/12/2021   Lab Results  Component Value Date   HDL 37.20 (L) 11/12/2021   Lab Results  Component Value Date   LDLCALC 63 11/12/2021   Lab Results  Component Value Date   TRIG 87.0 11/12/2021   Lab Results  Component Value Date   CHOLHDL 3 11/12/2021   No results found for: HGBA1C     Assessment & Plan:   1. Acute on chronic systolic CHF (congestive heart failure) ([place] Repeat labs today - adding BNP given weight gain and mild BLE edema. Thorough discussion regarding daily weights (notify u[place]or cardiology if gaining >2-3 pounds overnight or 5-6 lbs in a week), wear compression socks, elevate legs, avoid sodium, take meds as prescribed, fluid restriction.  - Basic Metabolic Panel (BMET) - Brain natriuretic  peptide; Future  2. Acute renal failure superimposed on stage 3b chronic kidney disease, unspecified acute renal failure type ([place] Rechecking renal function today to ensure trending back to normal.  - Basic Metabolic Panel (BMET) - Brain natriuretic peptide; Future  3. Abdominal aortic aneurysm (AAA) without rupture, unspecified part Offered vascular referral, but patient reports Dr. CStanford Breedhas been monitoring regularly. They agree to mention this to him at upcoming appointment.   4. Chronic obstructive pulmonary disease, unspecified COPD type ([place] Discussed oxygen supplementation and goal to eventually get him back to room air. Recommend goal O2 sats 88-93% since to prevent CO2 retention. Continue monitoring O2 level at home. Keep upcoming pulmonology appointment. If consistently O2 >95%, drop down to 2L to see if he tolerates. Patient aware of signs/symptoms requiring further/urgent evaluation.   Home health is already set up and grandchildren live with him. Denies any new complaints or needs. Keep  all upcoming specialist appointments.   Follow-up here sooner if needed. Patient aware of signs/symptoms requiring further/urgent evaluation.   I spent 40 minutes dedicated to the care of this patient on the date of this encounter to include pre-visit chart review of prior notes and results, face-to-face time with the patient, and post-visit ordering of testing as indicated.    Terrilyn Saver, NP

## 2022-02-25 ENCOUNTER — Telehealth (HOSPITAL_COMMUNITY): Payer: Self-pay

## 2022-02-25 ENCOUNTER — Other Ambulatory Visit (HOSPITAL_COMMUNITY): Payer: Self-pay

## 2022-02-25 LAB — BASIC METABOLIC PANEL
BUN: 17 mg/dL (ref 6–23)
CO2: 26 mEq/L (ref 19–32)
Calcium: 9.2 mg/dL (ref 8.4–10.5)
Chloride: 109 mEq/L (ref 96–112)
Creatinine, Ser: 1.25 mg/dL (ref 0.40–1.50)
GFR: 56.95 mL/min — ABNORMAL LOW (ref >60.00)
Glucose, Bld: 75 mg/dL (ref 70–99)
Potassium: 4.6 mEq/L (ref 3.5–5.1)
Sodium: 143 mEq/L (ref 135–145)

## 2022-02-25 NOTE — Telephone Encounter (Signed)
Transitions of Care Pharmacy   Call attempted for a pharmacy transitions of care follow-up. HIPAA appropriate voicemail was left with call back information provided.   Call attempt #2. Will follow-up in 2-3 days.    

## 2022-02-26 ENCOUNTER — Telehealth: Payer: Self-pay | Admitting: Family Medicine

## 2022-02-26 ENCOUNTER — Telehealth (HOSPITAL_COMMUNITY): Payer: Self-pay

## 2022-02-26 ENCOUNTER — Telehealth: Payer: Self-pay

## 2022-02-26 ENCOUNTER — Other Ambulatory Visit (HOSPITAL_COMMUNITY): Payer: Self-pay

## 2022-02-26 DIAGNOSIS — I5023 Acute on chronic systolic (congestive) heart failure: Secondary | ICD-10-CM

## 2022-02-26 DIAGNOSIS — N289 Disorder of kidney and ureter, unspecified: Secondary | ICD-10-CM

## 2022-02-26 MED ORDER — FUROSEMIDE 40 MG PO TABS: 40.0000 mg | ORAL_TABLET | Freq: Every day | ORAL | 0 refills | Status: AC

## 2022-02-26 NOTE — Telephone Encounter (Signed)
Transitions of Care Pharmacy  ? ?Call attempted for a pharmacy transitions of care follow-up. HIPAA appropriate voicemail was left with call back information provided.  ? ?Call attempt #3. Final attempt ?  ?

## 2022-02-26 NOTE — Telephone Encounter (Signed)
Spoke with pt's granddaughter Caryl Pina.  She will pick up his Lasix this evening and get him started on that.  Lab appointment is at 9:45 on Tues and I've scheduled him with you right after at 10am. ?

## 2022-02-26 NOTE — Addendum Note (Signed)
Addended by: Caleen Jobs B on: 02/26/2022 03:26 PM ? ? Modules accepted: Orders, Level of Service ? ?

## 2022-02-26 NOTE — Telephone Encounter (Signed)
Spoke with Jason [name] and scheduled lab appointment for 03/02/2022  ?

## 2022-02-26 NOTE — Telephone Encounter (Signed)
Attempted to call patient to see if he had any lasix at home, but no answer. Based on improved kidney function and signs of fluid retention (weight gain and lower extremity swelling). I am going to send in some lasix 40 mg tabs. I want him to take one a day for the next 3 days and follow-up with me to recheck swelling and labs. He already has future BNP ordered for Tuesday, adding on BMP to that day as well. See if he can see me near the same time of his lab appointment to reassess.  ? ?Purcell Nails. Olevia Bowens, DNP, FNP-C ? ?

## 2022-03-02 ENCOUNTER — Ambulatory Visit (INDEPENDENT_AMBULATORY_CARE_PROVIDER_SITE_OTHER): Payer: Medicare Other | Admitting: Family Medicine

## 2022-03-02 ENCOUNTER — Other Ambulatory Visit: Payer: Medicare Other

## 2022-03-02 ENCOUNTER — Encounter: Payer: Self-pay | Admitting: Family Medicine

## 2022-03-02 VITALS — BP 130/68 | HR 67 | Ht 68.0 in | Wt 176.2 lb

## 2022-03-02 DIAGNOSIS — I714 Abdominal aortic aneurysm, without rupture, unspecified: Secondary | ICD-10-CM | POA: Diagnosis not present

## 2022-03-02 DIAGNOSIS — N1832 Chronic kidney disease, stage 3b: Secondary | ICD-10-CM | POA: Diagnosis not present

## 2022-03-02 DIAGNOSIS — N179 Acute kidney failure, unspecified: Secondary | ICD-10-CM | POA: Diagnosis not present

## 2022-03-02 DIAGNOSIS — I5023 Acute on chronic systolic (congestive) heart failure: Secondary | ICD-10-CM | POA: Diagnosis not present

## 2022-03-02 DIAGNOSIS — N289 Disorder of kidney and ureter, unspecified: Secondary | ICD-10-CM | POA: Diagnosis not present

## 2022-03-02 LAB — BASIC METABOLIC PANEL
BUN: 14 mg/dL (ref 6–23)
CO2: 28 mEq/L (ref 19–32)
Calcium: 8.8 mg/dL (ref 8.4–10.5)
Chloride: 108 mEq/L (ref 96–112)
Creatinine, Ser: 1.28 mg/dL (ref 0.40–1.50)
GFR: 55.35 mL/min — ABNORMAL LOW (ref >60.00)
Glucose, Bld: 88 mg/dL (ref 70–99)
Potassium: 4.8 mEq/L (ref 3.5–5.1)
Sodium: 142 mEq/L (ref 135–145)

## 2022-03-02 LAB — BRAIN NATRIURETIC PEPTIDE: Pro B Natriuretic peptide (BNP): 995 pg/mL — ABNORMAL HIGH (ref 0.0–100.0)

## 2022-03-02 NOTE — Progress Notes (Signed)
Established Patient Office Visit  Subjective:  Patient ID: Jason [name], male    DOB: 03/12/1946  Age: 76 y.o. MRN: [id number]  CC: edema follow-up   HPI Jason [name] presents for edema/CHF follow-up.  At last visit, patient had gained >10 pounds since hospital discharge note, had mild BLE edema. Recheck of Creatinine was stable so he was instructed to take 40 mg lasix for 3 days and follow-up today. Reports he has had good urine output. Weight is unchanged. Even home log, states that he has consistently been weight 175 lbs for the past week. He doesn't think he was actually as low as d/c note states he weight at day of discharge. He denies any chest pain, worsening dyspnea, cough, palpitations, edema in hands or feet, distention. Granddaughter states he has been eating much better since being back home, so that could be where some of the weight gain is from. Has been elevating legs, avoiding sodium, etc.      Past Medical History:  Diagnosis Date   Anemia 05/08/2015   CML (chronic myelocytic leukemia) ([place]) 12/16/2011   Coronary heart disease    CVA (cerebrovascular accident) ([place])    GERD (gastroesophageal reflux disease)    Grief reaction 03/18/2014   H/O tobacco use, presenting hazards to health 02/24/2012   No cigarettes since pacemaker placement. Encouraged ongoing efforts    History of chicken pox    History of kidney stones    Hyperlipidemia    Hypertension    IBS (irritable bowel syndrome) 09/20/2013   Medicare annual wellness visit, subsequent 03/23/2014   PAF (paroxysmal atrial fibrillation) (HCC)    Symptomatic bradycardia    a. symptomatic bradycardia due to sinus node dysfunction s/p RA and RV Medtronic pacemaker 04/15/16    Past Surgical History:  Procedure Laterality Date   CATARACT EXTRACTION     CHOLECYSTECTOMY  2003   CORONARY ANGIOPLASTY WITH STENT PLACEMENT  2000   EP IMPLANTABLE DEVICE N/A 04/15/2016   Procedure: Pacemaker Implant;  Surgeon: Evans Lance, MD; Medtronic (serial number [id number]) pacemaker; Laterality: Left   RIGHT/LEFT HEART CATH AND CORONARY ANGIOGRAPHY N/A 02/18/2022   Procedure: RIGHT/LEFT HEART CATH AND CORONARY ANGIOGRAPHY;  Surgeon: Martinique, Peter M, MD;  Location: [facility];  Service: Cardiovascular;  Laterality: N/A;    Family History  Problem Relation Age of Onset   Arthritis Mother    Hypertension Mother    Heart disease Mother        Rheumatic fever   Rheumatic fever Mother    Other Mother        brain tumor   Cancer Mother        leukemia   Hypertension Father    Alcohol abuse Father    Diabetes Son    Colon cancer Neg Hx    Breast cancer Neg Hx    Prostate cancer Neg Hx     Social History   Socioeconomic History   Marital status: Widowed    Spouse name: Not on file   Number of children: 3   Years of education: Not on file   Highest education level: Not on file  Occupational History   Occupation: Retired    Comment: Retired  Tobacco Use   Smoking status: Former    Packs/day: 0.50    Years: 42.00    Pack years: 21.00    Types: Cigarettes    Start date: 10/30/1967    Quit date: 04/14/2016    Years  since quitting: 5.8   Smokeless tobacco: Never  Vaping Use   Vaping Use: Never used  Substance and Sexual Activity   Alcohol use: No    Alcohol/week: 0.0 standard drinks    Comment: Rare   Drug use: No   Sexual activity: Never    Comment: wife died in 01/11/23 married 49 years, grandson livew with patient  Other Topics Concern   Not on file  Social History Narrative   Not on file   Social Determinants of Health   Financial Resource Strain: Low Risk    Difficulty of Paying Living Expenses: Not hard at all  Food Insecurity: No Food Insecurity   Worried About Charity fundraiser in the Last Year: Never true   [place] in the Last Year: Never true  Transportation Needs: No Transportation Needs   Lack of Transportation (Medical): No   Lack of Transportation  (Non-Medical): No  Physical Activity: Inactive   Days of Exercise per Week: 0 days   Minutes of Exercise per Session: 0 min  Stress: No Stress Concern Present   Feeling of Stress : Not at all  Social Connections: Moderately Isolated   Frequency of Communication with Friends and Family: More than three times a week   Frequency of Social Gatherings with Friends and Family: Once a week   Attends Religious Services: 1 to 4 times per year   Active Member of [employer] or Organizations: No   Attends Archivist Meetings: Never   Marital Status: Widowed  Human resources officer Violence: Not At Risk   Fear of Current or Ex-Partner: No   Emotionally Abused: No   Physically Abused: No   Sexually Abused: No    Outpatient Medications Prior to Visit  Medication Sig Dispense Refill   albuterol (VENTOLIN HFA) 108 (90 Base) MCG/ACT inhaler Inhale 2 puffs into the lungs every 6 (six) hours as needed. (Patient taking differently: Inhale 2 puffs into the lungs every 6 (six) hours as needed for shortness of breath.) 18 g 0   amiodarone (PACERONE) 200 MG tablet Take 1 tablet (200 mg total) by mouth daily. 30 tablet 2   aspirin 81 MG EC tablet Take 1 tablet (81 mg total) by mouth daily. Swallow whole. 30 tablet 2   atorvastatin (LIPITOR) 80 MG tablet Take 1 tablet (80 mg total) by mouth daily. 30 tablet 2   empagliflozin (JARDIANCE) 10 MG TABS tablet Take 1 tablet (10 mg total) by mouth daily. 30 tablet 2   ferrous sulfate 325 (65 FE) MG EC tablet Take 1 tablet (325 mg total) by mouth 2 (two) times daily. (Patient taking differently: Take 325 mg by mouth daily.) 60 tablet 11   fluticasone (FLONASE) 50 MCG/ACT nasal spray Place 2 sprays into both nostrils daily. 48 g 3   furosemide (LASIX) 40 MG tablet Take 1 tablet (40 mg total) by mouth daily. 30 tablet 0   imatinib ([name]) 400 MG tablet TAKE 1 TABLET (400MG TOTAL) BY MOUTH DAILY. TAKE WITH A MEAL AND LARGE GLASS OF WATER. (Patient taking differently: Take  400 mg by mouth daily.) 30 tablet 6   levothyroxine (SYNTHROID) 25 MCG tablet Take 1 tablet (25 mcg total) by mouth daily before breakfast. 30 tablet 2   magnesium oxide (MAG-OX) 400 MG tablet Take 1 tablet (400 mg total) by mouth daily. Except Tuesday and Saturday take it BID. (Patient taking differently: Take 400-800 mg by mouth See admin instructions. Take one tablet by mouth every day  except on Tuesdays and Saturdays take two tablets by mouth per patient) 30 tablet 2   meclizine (ANTIVERT) 25 MG tablet TAKE 1 TABLET THREE TIMES DAILY (Patient taking differently: Take 25 mg by mouth 3 (three) times daily as needed for dizziness.) 90 tablet 0   metoprolol succinate (TOPROL-XL) 200 MG 24 hr tablet Take 1 tablet (200 mg total) by mouth daily. Take with or immediately following a meal. 90 tablet 3   nitroGLYCERIN (NITROSTAT) 0.4 MG SL tablet Place 1 tablet (0.4 mg total) under the tongue every 5 (five) minutes as needed for chest pain. 25 tablet 1   omeprazole (PRILOSEC) 40 MG capsule Take 1 capsule (40 mg total) by mouth daily. 90 capsule 2   ondansetron (ZOFRAN) 4 MG tablet Take 1 tablet (4 mg total) by mouth 2 (two) times daily as needed for nausea or vomiting. 180 tablet 1   rivaroxaban (XARELTO) 20 MG TABS tablet TAKE 1 TABLET BY MOUTH ONCE DAILY WITH SUPPER (Patient taking differently: 20 mg daily.) 90 tablet 1   Saccharomyces boulardii (PROBIOTIC) 250 MG CAPS Take 250 mg by mouth daily. 90 capsule 1   vitamin B-12 (CYANOCOBALAMIN) 500 MCG tablet Take 500 mcg by mouth daily.     Facility-Administered Medications Prior to Visit  Medication Dose Route Frequency Provider Last Rate Last Admin   cyanocobalamin ((VITAMIN B-12)) injection 1,000 mcg  1,000 mcg Intramuscular Q30 days Mosie Lukes, MD   1,000 mcg at 07/15/20 7673    No Known Allergies  ROS Review of Systems All review of systems negative except what is listed in the HPI    Objective:    Physical Exam Vitals reviewed.   Constitutional:      Appearance: Normal appearance.  Cardiovascular:     Rate and Rhythm: Normal rate and regular rhythm.     Heart sounds: Murmur heard.  Pulmonary:     Effort: Pulmonary effort is normal.     Breath sounds: Normal breath sounds. No rales.  Abdominal:     Palpations: Abdomen is soft.  Musculoskeletal:     Right lower leg: No edema.     Left lower leg: No edema.  Skin:    General: Skin is warm and dry.     Capillary Refill: Capillary refill takes less than 2 seconds.  Neurological:     General: No focal deficit present.     Mental Status: He is alert and oriented to person, place, and time. Mental status is at baseline.  Psychiatric:        Mood and Affect: Mood normal.        Behavior: Behavior normal.        Thought Content: Thought content normal.        Judgment: Judgment normal.    BP 130/68    Pulse 67    Ht 5' 8" (1.727 m)    Wt 176 lb 3.2 oz (79.9 kg)    SpO2 95% Comment: 3L   BMI 26.79 kg/m  Wt Readings from Last 3 Encounters:  03/02/22 176 lb 3.2 oz (79.9 kg)  02/24/22 176 lb 9.6 oz (80.1 kg)  02/19/22 155 lb 11.2 oz (70.6 kg)     Health Maintenance Due  Topic Date Due   COVID-19 Vaccine (1) Never done   Zoster Vaccines- Shingrix (1 of 2) Never done   COLONOSCOPY (Pts 45-70yr Insurance coverage will need to be confirmed)  09/01/2018    There are no preventive care reminders to display for this  patient.  Lab Results  Component Value Date   TSH 13.891 (H) 12/15/2021   Lab Results  Component Value Date   WBC 13.3 (H) 02/19/2022   HGB 13.5 02/19/2022   HCT 42.4 02/19/2022   MCV 92.8 02/19/2022   PLT 254 02/19/2022   Lab Results  Component Value Date   NA 143 02/24/2022   K 4.6 02/24/2022   CHLORIDE 108 11/26/2016   CO2 26 02/24/2022   GLUCOSE 75 02/24/2022   BUN 17 02/24/2022   CREATININE 1.25 02/24/2022   BILITOT 1.1 02/15/2022   ALKPHOS 73 02/15/2022   AST 21 02/15/2022   ALT 11 02/15/2022   PROT 6.9 02/15/2022   ALBUMIN  3.5 02/15/2022   CALCIUM 9.2 02/24/2022   ANIONGAP 8 02/19/2022   EGFR 74 (L) 11/26/2016   GFR 56.95 (L) 02/24/2022   Lab Results  Component Value Date   CHOL 118 11/12/2021   Lab Results  Component Value Date   HDL 37.20 (L) 11/12/2021   Lab Results  Component Value Date   LDLCALC 63 11/12/2021   Lab Results  Component Value Date   TRIG 87.0 11/12/2021   Lab Results  Component Value Date   CHOLHDL 3 11/12/2021   No results found for: HGBA1C    Assessment & Plan:   1. Acute on chronic systolic CHF (congestive heart failure) (HCC) No signs of fluid overload today.  Update BMP and BNP today - not adding any more lasix for now. Will see lab results first.  Continue daily weights, compression socks, elevating legs, avoiding sodium, etc.  Keep upcoming cardiology appointments.  Scheduled to see cardiology tomorrow.  Patient aware of signs/symptoms requiring further/urgent evaluation.     Follow-up: Return if symptoms worsen or fail to improve. Keep all upcoming follow-up appointments.    Terrilyn Saver, NP

## 2022-03-02 NOTE — Progress Notes (Signed)
HEART & VASCULAR TRANSITION OF CARE CONSULT NOTE     Referring Physician:Dr [place]  Primary Care:Dr [facility] Primary Cardiologist:Dr Crenshaw/ [place] Oncology: Dr Marin Olp Pulmonary: Dr Wonda Amis   HPI: Referred to clinic by Dr [place]  for heart failure consultation.   Mr Westfall is a 76 year old with a history of hyperlipidemia., PAF, AAA, CKD Stage III, chronic HFrEF, tachy brady syndrome, Dual chamber PPM, HTN, CVA, COPD, chronic myelocytic leukemia (on [name]), former smoker quit 2017,  and hypothyroidism.   Had COVID in December 2022. After that he was discharged with home oxygen.   Remote transmission on 02/10/22 with episodes of rapid a fib. Toprol XL was increased to 200 mg daily.   Admitted 02/15/22 with increased shortness of breath in the setting COPD exacerbation + HF + A fib RVR. Placed on Amio and continued to Toprol xl. CTA negative for PE. Diuresed with IV lasix. Had cath that showed 30% pLAD, 90% Ramus, 30% pLCx and 85% mLCx, occluded RPDA; and recommended medical management. Completed steroid course. Discharged 02/19/22.   Presents today with his granddaughter. Overall feeling fine. Complaining of intermittent nausea SOB with exertion. On chronic oxygen 2 liters at rest and 3 liters with exertion. Denies PND/Orthopnea. Appetite ok. Eats everything accept liver. Drinks 3-4 bottles of Pepsi.  No fever or chills. Weight at home 175 pounds. Taking all medications. Lives with his grandson and Curator.   Cardiac Testing  Echo 01/2022 -EF 30% -35%. The left ventricle demonstrates global hypokinesis. Grad I DD. Severe hypokinesis of the left ventricular, entire inferolateral wall and inferior wall.RV mildly reduced.   Echo 08/2021 EF 45-50%.   RHC/LHC 01/2022   Prox LAD lesion is 30% stenosed.   Lat Ramus lesion is 90% stenosed.   Prox Cx to Mid Cx lesion is 30% stenosed.   Mid Cx lesion is 85% stenosed.   RPDA lesion is 100% stenosed.   LV end diastolic pressure is  normal.   Hemodynamic findings consistent with mild pulmonary hypertension.  Obstructive CAD. 90% sub branch of trifurcating ramus intermediate. 85% focal LCx stenosis just distal to prior stent. 100% PDA at prior stent with left to right collaterals. Normal/low LV filling pressures. PCWP 3 mm Hg Mild to moderate pulmonary HTN mean 34 mm Hg Fair cardiac output index 2.47.   Plan; recommend medical therapy. The lesions in the LCX and ramus involve fairly small vessels and his LV dysfunction is significantly out of proportion to his CAD  Review of Systems: [y] = yes, '[ ]'$  = no   General: Weight gain '[ ]'$ ; Weight loss '[ ]'$ ; Anorexia '[ ]'$ ; Fatigue '[ ]'$ ; Fever '[ ]'$ ; Chills '[ ]'$ ; Weakness '[ ]'$   Cardiac: Chest pain/pressure '[ ]'$ ; Resting SOB '[ ]'$ ; Exertional SOB [ Y]; Orthopnea '[ ]'$ ; Pedal Edema '[ ]'$ ; Palpitations '[ ]'$ ; Syncope '[ ]'$ ; Presyncope '[ ]'$ ; Paroxysmal nocturnal dyspnea'[ ]'$   Pulmonary: Cough '[ ]'$ ; Wheezing'[ ]'$ ; Hemoptysis'[ ]'$ ; Sputum '[ ]'$ ; Snoring '[ ]'$   GI: Vomiting'[ ]'$ ; Dysphagia'[ ]'$ ; Melena'[ ]'$ ; Hematochezia '[ ]'$ ; Heartburn'[ ]'$ ; Abdominal pain '[ ]'$ ; Constipation '[ ]'$ ; Diarrhea '[ ]'$ ; BRBPR '[ ]'$   GU: Hematuria'[ ]'$ ; Dysuria '[ ]'$ ; Nocturia'[ ]'$   Vascular: Pain in legs with walking '[ ]'$ ; Pain in feet with lying flat '[ ]'$ ; Non-healing sores '[ ]'$ ; Stroke '[ ]'$ ; TIA '[ ]'$ ; Slurred speech '[ ]'$ ;  Neuro: Headaches'[ ]'$ ; Vertigo'[ ]'$ ; Seizures'[ ]'$ ; Paresthesias'[ ]'$ ;Blurred vision '[ ]'$ ; Diplopia '[ ]'$ ; Vision changes '[ ]'$   Ortho/Skin:  Arthritis '[ ]'$ ; Joint pain [ Y]; Muscle pain '[ ]'$ ; Joint swelling '[ ]'$ ; Back Pain '[ ]'$ ; Rash '[ ]'$   Psych: Depression'[ ]'$ ; Anxiety'[ ]'$   Heme: Bleeding problems '[ ]'$ ; Clotting disorders '[ ]'$ ; Anemia [ Y]  Endocrine: Diabetes '[ ]'$ ; Thyroid dysfunction[ Y]   Past Medical History:  Diagnosis Date   Anemia 05/08/2015   CML (chronic myelocytic leukemia) ([place]) 12/16/2011   Coronary heart disease    CVA (cerebrovascular accident) ([place])    GERD (gastroesophageal reflux disease)    Grief reaction 03/18/2014   H/O tobacco use, presenting  hazards to health 02/24/2012   No cigarettes since pacemaker placement. Encouraged ongoing efforts    History of chicken pox    History of kidney stones    Hyperlipidemia    Hypertension    IBS (irritable bowel syndrome) 09/20/2013   Medicare annual wellness visit, subsequent 03/23/2014   PAF (paroxysmal atrial fibrillation) (HCC)    Symptomatic bradycardia    a. symptomatic bradycardia due to sinus node dysfunction s/p RA and RV Medtronic pacemaker 04/15/16    Current Outpatient Medications  Medication Sig Dispense Refill   albuterol (VENTOLIN HFA) 108 (90 Base) MCG/ACT inhaler Inhale 2 puffs into the lungs every 6 (six) hours as needed. (Patient taking differently: Inhale 2 puffs into the lungs every 6 (six) hours as needed for shortness of breath.) 18 g 0   amiodarone (PACERONE) 200 MG tablet Take 1 tablet (200 mg total) by mouth daily. 30 tablet 2   aspirin 81 MG EC tablet Take 1 tablet (81 mg total) by mouth daily. Swallow whole. 30 tablet 2   atorvastatin (LIPITOR) 80 MG tablet Take 1 tablet (80 mg total) by mouth daily. 30 tablet 2   empagliflozin (JARDIANCE) 10 MG TABS tablet Take 1 tablet (10 mg total) by mouth daily. 30 tablet 2   ferrous sulfate 325 (65 FE) MG EC tablet Take 1 tablet (325 mg total) by mouth 2 (two) times daily. (Patient taking differently: Take 325 mg by mouth daily.) 60 tablet 11   fluticasone (FLONASE) 50 MCG/ACT nasal spray Place 2 sprays into both nostrils daily. 48 g 3   furosemide (LASIX) 40 MG tablet Take 1 tablet (40 mg total) by mouth daily. 30 tablet 0   imatinib ([name]) 400 MG tablet TAKE 1 TABLET ('400MG'$  TOTAL) BY MOUTH DAILY. TAKE WITH A MEAL AND LARGE GLASS OF WATER. (Patient taking differently: Take 400 mg by mouth daily.) 30 tablet 6   levothyroxine (SYNTHROID) 25 MCG tablet Take 1 tablet (25 mcg total) by mouth daily before breakfast. 30 tablet 2   magnesium oxide (MAG-OX) 400 MG tablet Take 1 tablet (400 mg total) by mouth daily. Except Tuesday and  Saturday take it BID. (Patient taking differently: Take 400-800 mg by mouth See admin instructions. Take one tablet by mouth every day except on Tuesdays and Saturdays take two tablets by mouth per patient) 30 tablet 2   meclizine (ANTIVERT) 25 MG tablet TAKE 1 TABLET THREE TIMES DAILY (Patient taking differently: Take 25 mg by mouth 3 (three) times daily as needed for dizziness.) 90 tablet 0   metoprolol succinate (TOPROL-XL) 200 MG 24 hr tablet Take 1 tablet (200 mg total) by mouth daily. Take with or immediately following a meal. 90 tablet 3   nitroGLYCERIN (NITROSTAT) 0.4 MG SL tablet Place 1 tablet (0.4 mg total) under the tongue every 5 (five) minutes as needed for chest pain. 25 tablet 1   omeprazole (PRILOSEC) 40 MG capsule  Take 1 capsule (40 mg total) by mouth daily. 90 capsule 2   ondansetron (ZOFRAN) 4 MG tablet Take 1 tablet (4 mg total) by mouth 2 (two) times daily as needed for nausea or vomiting. 180 tablet 1   rivaroxaban (XARELTO) 20 MG TABS tablet TAKE 1 TABLET BY MOUTH ONCE DAILY WITH SUPPER (Patient taking differently: 20 mg daily.) 90 tablet 1   Saccharomyces boulardii (PROBIOTIC) 250 MG CAPS Take 250 mg by mouth daily. 90 capsule 1   vitamin B-12 (CYANOCOBALAMIN) 500 MCG tablet Take 500 mcg by mouth daily.     Current Facility-Administered Medications  Medication Dose Route Frequency Provider Last Rate Last Admin   cyanocobalamin ((VITAMIN B-12)) injection 1,000 mcg  1,000 mcg Intramuscular Q30 days Mosie Lukes, MD   1,000 mcg at 07/15/20 4098    No Known Allergies    Social History   Socioeconomic History   Marital status: Widowed    Spouse name: Not on file   Number of children: 3   Years of education: Not on file   Highest education level: Not on file  Occupational History   Occupation: Retired    Comment: Retired  Tobacco Use   Smoking status: Former    Packs/day: 0.50    Years: 42.00    Pack years: 21.00    Types: Cigarettes    Start date: 10/30/1967     Quit date: 04/14/2016    Years since quitting: 5.8   Smokeless tobacco: Never  Vaping Use   Vaping Use: Never used  Substance and Sexual Activity   Alcohol use: No    Alcohol/week: 0.0 standard drinks    Comment: Rare   Drug use: No   Sexual activity: Never    Comment: wife died in 01-14-2023 married 79 years, grandson livew with patient  Other Topics Concern   Not on file  Social History Narrative   Not on file   Social Determinants of Health   Financial Resource Strain: Low Risk    Difficulty of Paying Living Expenses: Not hard at all  Food Insecurity: No Food Insecurity   Worried About Charity fundraiser in the Last Year: Never true   [place] in the Last Year: Never true  Transportation Needs: No Transportation Needs   Lack of Transportation (Medical): No   Lack of Transportation (Non-Medical): No  Physical Activity: Inactive   Days of Exercise per Week: 0 days   Minutes of Exercise per Session: 0 min  Stress: No Stress Concern Present   Feeling of Stress : Not at all  Social Connections: Moderately Isolated   Frequency of Communication with Friends and Family: More than three times a week   Frequency of Social Gatherings with Friends and Family: Once a week   Attends Religious Services: 1 to 4 times per year   Active Member of [employer] or Organizations: No   Attends Archivist Meetings: Never   Marital Status: Widowed  Human resources officer Violence: Not At Risk   Fear of Current or Ex-Partner: No   Emotionally Abused: No   Physically Abused: No   Sexually Abused: No      Family History  Problem Relation Age of Onset   Arthritis Mother    Hypertension Mother    Heart disease Mother        Rheumatic fever   Rheumatic fever Mother    Other Mother        brain tumor   Cancer Mother  leukemia   Hypertension Father    Alcohol abuse Father    Diabetes Son    Colon cancer Neg Hx    Breast cancer Neg Hx    Prostate cancer Neg Hx     Vitals:    03/03/22 1210  BP: 122/84  Pulse: 74  SpO2: (!) 88%  Weight: 80.6 kg   Wt Readings from Last 3 Encounters:  03/03/22 80.6 kg  03/02/22 79.9 kg  02/24/22 80.1 kg    PHYSICAL EXAM: General:   Walked in the clinic. No respiratory difficulty HEENT: normal Neck: supple. no JVD. Carotids 2+ bilat; no bruits. No lymphadenopathy or thryomegaly appreciated. Cor: PMI nondisplaced. Regular rate & rhythm. No rubs, gallops or murmurs. Lungs: clear. No wheeze, on 2 liters.  Abdomen: soft, nontender, nondistended. No hepatosplenomegaly. No bruits or masses. Good bowel sounds. Extremities: no cyanosis, clubbing, rash, edema Neuro: alert & oriented x 3, cranial nerves grossly intact. moves all 4 extremities w/o difficulty. Affect pleasant.  ECG:  A Paced 61 bpm RBBB   ASSESSMENT & PLAN: 1. Chronic HFrEF 02/16/22 ECHO 30-35% down from previous EF 45-50% in 2022.  01/2022 Cath - Filling pressures low. Preserved cardiac output. 90% sub branch of trifurcating ramus intermediate. 85% focal LCx stenosis just distal to prior stent. 100% PDA at prior stent with left to right collaterals. LV dysfunction out of proportion to CAD. He had recent episodes for A fib RVR. If is possible this is tachy mediated.  NYHA III, chronically  - Volume status stable. Continue current dose of lasix - Hold off on arni and mra with dizziness. Could consider adding spiro at his f/u with Dr Virl Axe.  - Continue current dose of Toprol.  - Continue jardiance 10 mg daily.  - I reviewed BMET from 03/02/22 . Stable.  - Will need repeat ECHO 3 months after HF meds optimized.   2. PAF -AV Paced 61 bpm.  -On amio and Toprol XL per Dr Curt Bears Long term amio per Dr Tomasa Blase - On xarelto    3. CAD  CAD. 90% sub branch of trifurcating ramus intermediate. 85% focal LCx stenosis just distal to prior stent. 100% PDA at prior stent with left to right collaterals.  On statin  No chest pain.   3. Chronic Respiratory Failure, COPD.   On 2-3 liters oxygen. Sats with exertion mid 80s. Improved with rest.   Referred to HFSW (PCP, Medications, Transportation, ETOH Abuse, Drug Abuse, Insurance, Financial ):  No Refer to Pharmacy: No Refer to Home Health:  No Refer to Advanced Heart Failure Clinic: NO  Refer to General Cardiology: He is established with Dr Stanford Breed.   Follow up  as needed.   Lakeyia Surber NP-C  1:43 PM

## 2022-03-02 NOTE — Patient Instructions (Addendum)
No signs of fluid overload today.  ?Update BMP and BNP today - not adding any more lasix for now. Will see lab results first.  ?Continue daily weight, compression socks, elevating legs, avoiding sodium, etc.  ?Keep upcoming cardiology appointments.  ? ?

## 2022-03-03 ENCOUNTER — Encounter (HOSPITAL_COMMUNITY): Payer: Self-pay

## 2022-03-03 ENCOUNTER — Other Ambulatory Visit: Payer: Self-pay

## 2022-03-03 ENCOUNTER — Ambulatory Visit (HOSPITAL_COMMUNITY)
Admit: 2022-03-03 | Discharge: 2022-03-03 | Disposition: A | Discharge status: Home / Self Care | Payer: Medicare Other | Attending: Adult Health | Admitting: Adult Health

## 2022-03-03 VITALS — BP 122/84 | HR 74 | Wt 177.6 lb

## 2022-03-03 DIAGNOSIS — Z8774 Personal history of (corrected) congenital malformations of heart and circulatory system: Secondary | ICD-10-CM | POA: Diagnosis not present

## 2022-03-03 DIAGNOSIS — J9611 Chronic respiratory failure with hypoxia: Secondary | ICD-10-CM

## 2022-03-03 DIAGNOSIS — Z8673 Personal history of transient ischemic attack (TIA), and cerebral infarction without residual deficits: Secondary | ICD-10-CM | POA: Insufficient documentation

## 2022-03-03 DIAGNOSIS — E785 Hyperlipidemia, unspecified: Secondary | ICD-10-CM | POA: Diagnosis not present

## 2022-03-03 DIAGNOSIS — N183 Chronic kidney disease, stage 3 unspecified: Secondary | ICD-10-CM | POA: Diagnosis not present

## 2022-03-03 DIAGNOSIS — I495 Sick sinus syndrome: Secondary | ICD-10-CM | POA: Diagnosis not present

## 2022-03-03 DIAGNOSIS — Z79899 Other long term (current) drug therapy: Secondary | ICD-10-CM | POA: Diagnosis not present

## 2022-03-03 DIAGNOSIS — I714 Abdominal aortic aneurysm, without rupture, unspecified: Secondary | ICD-10-CM | POA: Diagnosis not present

## 2022-03-03 DIAGNOSIS — Z87891 Personal history of nicotine dependence: Secondary | ICD-10-CM | POA: Diagnosis not present

## 2022-03-03 DIAGNOSIS — E039 Hypothyroidism, unspecified: Secondary | ICD-10-CM | POA: Diagnosis not present

## 2022-03-03 DIAGNOSIS — I5022 Chronic systolic (congestive) heart failure: Secondary | ICD-10-CM

## 2022-03-03 DIAGNOSIS — J441 Chronic obstructive pulmonary disease with (acute) exacerbation: Secondary | ICD-10-CM | POA: Diagnosis not present

## 2022-03-03 DIAGNOSIS — Z7901 Long term (current) use of anticoagulants: Secondary | ICD-10-CM | POA: Insufficient documentation

## 2022-03-03 DIAGNOSIS — I130 Hypertensive heart and chronic kidney disease with heart failure and stage 1 through stage 4 chronic kidney disease, or unspecified chronic kidney disease: Secondary | ICD-10-CM | POA: Diagnosis not present

## 2022-03-03 DIAGNOSIS — C9210 Chronic myeloid leukemia, BCR/ABL-positive, not having achieved remission: Secondary | ICD-10-CM | POA: Diagnosis not present

## 2022-03-03 DIAGNOSIS — I480 Paroxysmal atrial fibrillation: Secondary | ICD-10-CM

## 2022-03-03 DIAGNOSIS — Z7984 Long term (current) use of oral hypoglycemic drugs: Secondary | ICD-10-CM | POA: Insufficient documentation

## 2022-03-03 DIAGNOSIS — J9610 Chronic respiratory failure, unspecified whether with hypoxia or hypercapnia: Secondary | ICD-10-CM | POA: Diagnosis not present

## 2022-03-03 DIAGNOSIS — I2510 Atherosclerotic heart disease of native coronary artery without angina pectoris: Secondary | ICD-10-CM

## 2022-03-03 NOTE — Patient Instructions (Signed)
It was great to see you today! ?No medication changes are needed at this time. ? ? ?Your physician recommends that you schedule a follow-up appointment as needed in the HF clinic ? ? ? ?Do the following things EVERYDAY: ?Weigh yourself in the morning before breakfast. Write it down and keep it in a log. ?Take your medicines as prescribed ?Eat low salt foods--Limit salt (sodium) to 2000 mg per day.  ?Stay as active as you can everyday ?Limit all fluids for the day to less than 2 liters ? ?At the [facility], you and your health needs are our priority. As part of our continuing mission to provide you with exceptional heart care, we have created designated Provider Care Teams. These Care Teams include your primary Cardiologist (physician) and Advanced Practice Providers (APPs- Physician Assistants and Nurse Practitioners) who all work together to provide you with the care you need, when you need it.  ? ?You may see any of the following providers on your designated Care Team at your next follow up: ?Dr Glori Bickers ?Dr Loralie Champagne ?Darrick Grinder, NP ?Lyda Jester, PA ?Jessica Milford,NP ?Marlyce Huge, PA ?Audry Riles, PharmD ? ? ?Please be sure to bring in all your medications bottles to every appointment.  ? ? ?

## 2022-03-03 NOTE — Progress Notes (Signed)
Renal function is still close to baseline. BNP (fluid marker) is quite high. I've reached out to the heart failure clinic that you are seeing later today and they will further address.

## 2022-03-05 ENCOUNTER — Other Ambulatory Visit: Payer: Self-pay

## 2022-03-05 ENCOUNTER — Telehealth: Payer: Self-pay | Admitting: Family Medicine

## 2022-03-05 DIAGNOSIS — R110 Nausea: Secondary | ICD-10-CM

## 2022-03-05 DIAGNOSIS — I10 Essential (primary) hypertension: Secondary | ICD-10-CM

## 2022-03-05 MED ORDER — LOSARTAN POTASSIUM 100 MG PO TABS: 100.0000 mg | ORAL_TABLET | Freq: Every day | ORAL | 1 refills | Status: AC

## 2022-03-05 MED ORDER — ONDANSETRON HCL 4 MG PO TABS: 4.0000 mg | ORAL_TABLET | Freq: Two times a day (BID) | ORAL | 1 refills | Status: AC | PRN

## 2022-03-05 NOTE — Telephone Encounter (Signed)
Refills sent. Pt notified.

## 2022-03-05 NOTE — Telephone Encounter (Signed)
Patient states he needs a refill on Losartan, unable to find on med list and only shows as discontinued. He would like clarification on it.  ? ? ?Medication: ondansetron (ZOFRAN) 4 MG tablet ? ?Has the patient contacted their pharmacy? No. ? ?Preferred Pharmacy (with phone number or street name): \ ? ?[facility]  ?[place], [place] [address]  ?Phone:  [phone]  Fax:  [phone]  ? ?Agent: Please be advised that RX refills may take up to 3 business days. We ask that you follow-up with your pharmacy.  ?

## 2022-03-09 ENCOUNTER — Other Ambulatory Visit: Payer: Self-pay | Admitting: Pulmonary Disease

## 2022-03-09 DIAGNOSIS — Z9189 Other specified personal risk factors, not elsewhere classified: Secondary | ICD-10-CM

## 2022-03-10 ENCOUNTER — Ambulatory Visit (INDEPENDENT_AMBULATORY_CARE_PROVIDER_SITE_OTHER): Payer: Medicare Other | Admitting: Pulmonary Disease

## 2022-03-10 ENCOUNTER — Other Ambulatory Visit: Payer: Self-pay

## 2022-03-10 ENCOUNTER — Encounter: Payer: Self-pay | Admitting: Pulmonary Disease

## 2022-03-10 VITALS — BP 136/78 | HR 82 | Temp 97.6°F | Ht 68.0 in | Wt 179.4 lb

## 2022-03-10 DIAGNOSIS — Z9189 Other specified personal risk factors, not elsewhere classified: Secondary | ICD-10-CM | POA: Diagnosis not present

## 2022-03-10 DIAGNOSIS — J849 Interstitial pulmonary disease, unspecified: Secondary | ICD-10-CM | POA: Diagnosis not present

## 2022-03-10 DIAGNOSIS — R911 Solitary pulmonary nodule: Secondary | ICD-10-CM | POA: Diagnosis not present

## 2022-03-10 LAB — PULMONARY FUNCTION TEST
DL/VA % pred: 25 %
DL/VA: 1.01 ml/min/mmHg/L
DLCO cor % pred: 20 %
DLCO cor: 4.79 ml/min/mmHg
DLCO unc % pred: 19 %
DLCO unc: 4.63 ml/min/mmHg
FEF 25-75 Post: 1.65 L/sec
FEF 25-75 Pre: 1.9 L/sec
FEF2575-%Change-Post: -13 %
FEF2575-%Pred-Post: 76 %
FEF2575-%Pred-Pre: 88 %
FEV1-%Change-Post: -2 %
FEV1-%Pred-Post: 87 %
FEV1-%Pred-Pre: 90 %
FEV1-Post: 2.54 L
FEV1-Pre: 2.61 L
FEV1FVC-%Change-Post: -2 %
FEV1FVC-%Pred-Pre: 100 %
FEV6-%Change-Post: 0 %
FEV6-%Pred-Post: 95 %
FEV6-%Pred-Pre: 95 %
FEV6-Post: 3.55 L
FEV6-Pre: 3.55 L
FEV6FVC-%Pred-Post: 106 %
FEV6FVC-%Pred-Pre: 106 %
FVC-%Change-Post: 0 %
FVC-%Pred-Post: 89 %
FVC-%Pred-Pre: 89 %
FVC-Post: 3.55 L
FVC-Pre: 3.56 L
Post FEV1/FVC ratio: 71 %
Post FEV6/FVC ratio: 100 %
Pre FEV1/FVC ratio: 73 %
Pre FEV6/FVC Ratio: 100 %
RV % pred: 79 %
RV: 1.91 L
TLC % pred: 78 %
TLC: 5.24 L

## 2022-03-10 NOTE — Progress Notes (Signed)
? ? ? ? ?HPI:FU CHF, coronary artery disease and atrial fibrillation. Patient had stents placed at [facility] in 2000. Records are not available. Patient seen preoperatively at [facility] on September 01 2012 and electrocardiogram showed atrial fibrillation. TSH in September of 2013 was 1.612. Patient has been maintained on anticoagulation. Monitor April 2016 showed paroxysmal atrial fibrillation and Toprol increased. Nuclear study April 2017 showed ejection fraction 43%, inferior and lateral infarct but no ischemia. Has had pacemaker placed secondary to tachybradycardia syndrome. Echocardiogram July 2020 showed ejection fraction 50 to 16%, grade 1 diastolic dysfunction, mild to moderate aortic insufficiency and mildly dilated ascending aorta.  Abdominal ultrasound September 2022 showed 5.1 cm abdominal aortic aneurysm increased in size compared to previous.  Patient was admitted February 2023 with dyspnea felt secondary to a combination of COPD exacerbation, CHF and atrial fibrillation with rapid ventricular response.  Patient was diuresed with improvement and also completed a course of steroids and pulmonary toilet.  Echocardiogram February 2023 showed ejection fraction 30 to 10%, grade 1 diastolic dysfunction, hypokinetic inferior wall, mild biatrial enlargement, moderate right ventricular enlargement, moderate tricuspid regurgitation, mild to moderate aortic insufficiency.  Cardiac catheterization February 2023 showed 90% ramus, 85% circumflex, occluded PDA, normal left ventricular end-diastolic pressure and mild pulmonary hypertension.  Chest CT February 2023 showed no pulmonary embolus and fibrotic changes.  Scattered nodularity noted largest being 7 mm and follow-up recommended.  Abdominal aorta measured 4.9 x 4.5 cm.  Since last seen, patient is now on home oxygen.  He does have dyspnea.  He denies chest pain, palpitations, syncope or pedal edema.  No bleeding. ? ?Current Outpatient  Medications  ?Medication Sig Dispense Refill  ? albuterol (VENTOLIN HFA) 108 (90 Base) MCG/ACT inhaler Inhale 2 puffs into the lungs every 6 (six) hours as needed. (Patient taking differently: Inhale 2 puffs into the lungs every 6 (six) hours as needed for shortness of breath.) 18 g 0  ? amiodarone (PACERONE) 200 MG tablet Take 1 tablet (200 mg total) by mouth daily. 30 tablet 2  ? aspirin 81 MG EC tablet Take 1 tablet (81 mg total) by mouth daily. Swallow whole. 30 tablet 2  ? atorvastatin (LIPITOR) 80 MG tablet Take 1 tablet (80 mg total) by mouth daily. 30 tablet 2  ? empagliflozin (JARDIANCE) 10 MG TABS tablet Take 1 tablet (10 mg total) by mouth daily. 30 tablet 2  ? ferrous sulfate 325 (65 FE) MG EC tablet Take 1 tablet (325 mg total) by mouth 2 (two) times daily. (Patient taking differently: Take 325 mg by mouth daily.) 60 tablet 11  ? fluticasone (FLONASE) 50 MCG/ACT nasal spray Place 2 sprays into both nostrils daily. 48 g 3  ? furosemide (LASIX) 40 MG tablet Take 1 tablet (40 mg total) by mouth daily. 30 tablet 0  ? imatinib ([name]) 400 MG tablet TAKE 1 TABLET ('400MG'$  TOTAL) BY MOUTH DAILY. TAKE WITH A MEAL AND LARGE GLASS OF WATER. (Patient taking differently: Take 400 mg by mouth daily.) 30 tablet 6  ? levothyroxine (SYNTHROID) 25 MCG tablet Take 1 tablet (25 mcg total) by mouth daily before breakfast. 30 tablet 2  ? losartan (COZAAR) 100 MG tablet Take 1 tablet (100 mg total) by mouth daily. 90 tablet 1  ? magnesium oxide (MAG-OX) 400 MG tablet Take 1 tablet (400 mg total) by mouth daily. Except Tuesday and Saturday take it BID. (Patient taking differently: Take 400-800 mg by mouth See admin instructions. Take one tablet by mouth every  day except on Tuesdays and Saturdays take two tablets by mouth per patient) 30 tablet 2  ? meclizine (ANTIVERT) 25 MG tablet TAKE 1 TABLET THREE TIMES DAILY (Patient taking differently: Take 25 mg by mouth 3 (three) times daily as needed for dizziness.) 90 tablet 0  ?  metoprolol succinate (TOPROL-XL) 200 MG 24 hr tablet Take 1 tablet (200 mg total) by mouth daily. Take with or immediately following a meal. 90 tablet 3  ? nitroGLYCERIN (NITROSTAT) 0.4 MG SL tablet Place 1 tablet (0.4 mg total) under the tongue every 5 (five) minutes as needed for chest pain. 25 tablet 1  ? omeprazole (PRILOSEC) 40 MG capsule Take 1 capsule (40 mg total) by mouth daily. 90 capsule 2  ? ondansetron (ZOFRAN) 4 MG tablet Take 1 tablet (4 mg total) by mouth 2 (two) times daily as needed for nausea or vomiting. 180 tablet 1  ? rivaroxaban (XARELTO) 20 MG TABS tablet TAKE 1 TABLET BY MOUTH ONCE DAILY WITH SUPPER (Patient taking differently: 20 mg daily.) 90 tablet 1  ? Saccharomyces boulardii (PROBIOTIC) 250 MG CAPS Take 250 mg by mouth daily. 90 capsule 1  ? vitamin B-12 (CYANOCOBALAMIN) 500 MCG tablet Take 500 mcg by mouth daily.    ? ?Current Facility-Administered Medications  ?Medication Dose Route Frequency Provider Last Rate Last Admin  ? cyanocobalamin ((VITAMIN B-12)) injection 1,000 mcg  1,000 mcg Intramuscular Q30 days Mosie Lukes, MD   1,000 mcg at 07/15/20 7017  ? ? ? ?Past Medical History:  ?Diagnosis Date  ? Anemia 05/08/2015  ? CML (chronic myelocytic leukemia) ([place]) 12/16/2011  ? Coronary heart disease   ? CVA [facility]brovascular accident) [facility])   ? GERD (gastroesophageal reflux disease)   ? Grief reaction 03/18/2014  ? H/O tobacco use, presenting hazards to health 02/24/2012  ? No cigarettes since pacemaker placement. Encouraged ongoing efforts   ? History of chicken pox   ? History of kidney stones   ? Hyperlipidemia   ? Hypertension   ? IBS (irritable bowel syndrome) 09/20/2013  ? Medicare annual wellness visit, subsequent 03/23/2014  ? PAF (paroxysmal atrial fibrillation) ([place])   ? Symptomatic bradycardia   ? a. symptomatic bradycardia due to sinus node dysfunction s/p RA and RV Medtronic pacemaker 04/15/16  ? ? ?Past Surgical History:  ?Procedure Laterality Date  ? CATARACT EXTRACTION     ? CHOLECYSTECTOMY  2003  ? CORONARY ANGIOPLASTY WITH STENT PLACEMENT  2000  ? EP IMPLANTABLE DEVICE N/A 04/15/2016  ? Procedure: Pacemaker Implant;  Surgeon: Evans Lance, MD; Medtronic (serial number [id number]) pacemaker; Laterality: Left  ? RIGHT/LEFT HEART CATH AND CORONARY ANGIOGRAPHY N/A 02/18/2022  ? Procedure: RIGHT/LEFT HEART CATH AND CORONARY ANGIOGRAPHY;  Surgeon: Martinique, Peter M, MD;  Location: [place] [facility];  Service: Cardiovascular;  Laterality: N/A;  ? ? ?Social History  ? ?Socioeconomic History  ? Marital status: Widowed  ?  Spouse name: Not on file  ? Number of children: 3  ? Years of education: Not on file  ? Highest education level: Not on file  ?Occupational History  ? Occupation: Retired  ?  Comment: Retired  ?Tobacco Use  ? Smoking status: Former  ?  Packs/day: 0.50  ?  Years: 42.00  ?  Pack years: 21.00  ?  Types: Cigarettes  ?  Start date: 10/30/1967  ?  Quit date: 04/14/2016  ?  Years since quitting: 5.9  ? Smokeless tobacco: Never  ?Vaping Use  ? Vaping Use: Never used  ?  Substance and Sexual Activity  ? Alcohol use: No  ?  Alcohol/week: 0.0 standard drinks  ?  Comment: Rare  ? Drug use: No  ? Sexual activity: Never  ?  Comment: wife died in 02-Feb-2023 married 83 years, grandson livew with patient  ?Other Topics Concern  ? Not on file  ?Social History Narrative  ? Not on file  ? ?Social Determinants of Health  ? ?Financial Resource Strain: Not on file  ?Food Insecurity: Not on file  ?Transportation Needs: Not on file  ?Physical Activity: Not on file  ?Stress: Not on file  ?Social Connections: Not on file  ?Intimate Partner Violence: Not on file  ? ? ?Family History  ?Problem Relation Age of Onset  ? Arthritis Mother   ? Hypertension Mother   ? Heart disease Mother   ?     Rheumatic fever  ? Rheumatic fever Mother   ? Other Mother   ?     brain tumor  ? Cancer Mother   ?     leukemia  ? Hypertension Father   ? Alcohol abuse Father   ? Diabetes Son   ? Colon cancer Neg Hx   ? Breast cancer Neg  Hx   ? Prostate cancer Neg Hx   ? ? ?ROS: no fevers or chills, productive cough, hemoptysis, dysphasia, odynophagia, melena, hematochezia, dysuria, hematuria, rash, seizure activity, orthopnea, PND, pedal edema, cl

## 2022-03-10 NOTE — Progress Notes (Addendum)
? ?      ?Jason [name]    [id number]    February 21, 1946 ? ?Primary Care Physician:Blyth, Bonnita Levan, MD ? ?Referring Physician: Mosie Lukes, MD ?[address] ?[address] ?[name],  [place] [address] ? ?Chief complaint: Consult for interstitial lung disease ? ?HPI: ?76 year old man with hx of smoking, CLL on gleevec, COPD diagnosis without PFTs, Afib on amio/xarelto ?Was hospitalized for COVID in December 2022, treated with remdesivir and steroids.  Hospitalized again and February 2023 with acute on chronic respiratory failure felt to be secondary to COPD exacerbation, decompensated systolic/diastolic heart failure.  CT imaging at that time showed interstitial lung disease and PCCM was consulted ? ?Post discharge she continues on supplemental oxygen.  Complains of dyspnea on exertion that started with COVID infection.  Denies any cough, sputum production, fevers, ? ? ?Pets: No pets ?Occupation: Retired Chemical engineer ?Exposures: No mold, hot tub, Jacuzzi.  No feather pillows or comforters ?Smoking history: 25-pack-year smoker.  Quit in 2017 ?Travel history: Originally from [place].  No significant recent ?Relevant family history: No family history of lung ? ? ?Outpatient Encounter Medications as of 03/10/2022  ?Medication Sig  ? albuterol (VENTOLIN HFA) 108 (90 Base) MCG/ACT inhaler Inhale 2 puffs into the lungs every 6 (six) hours as needed. (Patient taking differently: Inhale 2 puffs into the lungs every 6 (six) hours as needed for shortness of breath.)  ? amiodarone (PACERONE) 200 MG tablet Take 1 tablet (200 mg total) by mouth daily.  ? aspirin 81 MG EC tablet Take 1 tablet (81 mg total) by mouth daily. Swallow whole.  ? atorvastatin (LIPITOR) 80 MG tablet Take 1 tablet (80 mg total) by mouth daily.  ? empagliflozin (JARDIANCE) 10 MG TABS tablet Take 1 tablet (10 mg total) by mouth daily.  ? ferrous sulfate 325 (65 FE) MG EC tablet Take 1 tablet (325 mg total) by mouth 2 (two) times daily. (Patient taking  differently: Take 325 mg by mouth daily.)  ? fluticasone (FLONASE) 50 MCG/ACT nasal spray Place 2 sprays into both nostrils daily.  ? furosemide (LASIX) 40 MG tablet Take 1 tablet (40 mg total) by mouth daily.  ? imatinib ([name]) 400 MG tablet TAKE 1 TABLET ('400MG'$  TOTAL) BY MOUTH DAILY. TAKE WITH A MEAL AND LARGE GLASS OF WATER. (Patient taking differently: Take 400 mg by mouth daily.)  ? levothyroxine (SYNTHROID) 25 MCG tablet Take 1 tablet (25 mcg total) by mouth daily before breakfast.  ? losartan (COZAAR) 100 MG tablet Take 1 tablet (100 mg total) by mouth daily.  ? magnesium oxide (MAG-OX) 400 MG tablet Take 1 tablet (400 mg total) by mouth daily. Except Tuesday and Saturday take it BID. (Patient taking differently: Take 400-800 mg by mouth See admin instructions. Take one tablet by mouth every day except on Tuesdays and Saturdays take two tablets by mouth per patient)  ? meclizine (ANTIVERT) 25 MG tablet TAKE 1 TABLET THREE TIMES DAILY (Patient taking differently: Take 25 mg by mouth 3 (three) times daily as needed for dizziness.)  ? metoprolol succinate (TOPROL-XL) 200 MG 24 hr tablet Take 1 tablet (200 mg total) by mouth daily. Take with or immediately following a meal.  ? nitroGLYCERIN (NITROSTAT) 0.4 MG SL tablet Place 1 tablet (0.4 mg total) under the tongue every 5 (five) minutes as needed for chest pain.  ? omeprazole (PRILOSEC) 40 MG capsule Take 1 capsule (40 mg total) by mouth daily.  ? ondansetron (ZOFRAN) 4 MG tablet Take  1 tablet (4 mg total) by mouth 2 (two) times daily as needed for nausea or vomiting.  ? rivaroxaban (XARELTO) 20 MG TABS tablet TAKE 1 TABLET BY MOUTH ONCE DAILY WITH SUPPER (Patient taking differently: 20 mg daily.)  ? Saccharomyces boulardii (PROBIOTIC) 250 MG CAPS Take 250 mg by mouth daily.  ? vitamin B-12 (CYANOCOBALAMIN) 500 MCG tablet Take 500 mcg by mouth daily.  ? ?Facility-Administered Encounter Medications as of 03/10/2022  ?Medication  ? cyanocobalamin ((VITAMIN  B-12)) injection 1,000 mcg  ? ? ?Allergies as of 03/10/2022  ? (No Known Allergies)  ? ? ?Past Medical History:  ?Diagnosis Date  ? Anemia 05/08/2015  ? CML (chronic myelocytic leukemia) ([place]) 12/16/2011  ? Coronary heart disease   ? [facility]bro[facility]) [facility]   ? GERD (gastroesophageal reflux disease)   ? Grief reaction 03/18/2014  ? H/O tobacco use, presenting hazards to health 02/24/2012  ? No cigarettes since pacemaker placement. Encouraged ongoing efforts   ? History of chicken pox   ? History of kidney stones   ? Hyperlipidemia   ? Hypertension   ? IBS (irritable bowel syndrome) 09/20/2013  ? Medicare annual wellness visit, subsequent 03/23/2014  ? PAF (paroxysmal atrial fibrillation) ([place])   ? Symptomatic bradycardia   ? a. symptomatic bradycardia due to sinus node dysfunction s/p RA and RV Medtronic pacemaker 04/15/16  ? ? ?Past Surgical History:  ?Procedure Laterality Date  ? CATARACT EXTRACTION    ? CHOLECYSTECTOMY  2003  ? CORONARY ANGIOPLASTY WITH STENT PLACEMENT  2000  ? EP IMPLANTABLE DEVICE N/A 04/15/2016  ? Procedure: Pacemaker Implant;  Surgeon: Evans Lance, MD; Medtronic (serial number [id number]) pacemaker; Laterality: Left  ? RIGHT/LEFT HEART CATH AND CORONARY ANGIOGRAPHY N/A 02/18/2022  ? Procedure: RIGHT/LEFT HEART CATH AND CORONARY ANGIOGRAPHY;  Surgeon: Martinique, Peter M, MD;  Location: [place] [facility];  Service: Cardiovascular;  Laterality: N/A;  ? ? ?Family History  ?Problem Relation Age of Onset  ? Arthritis Mother   ? Hypertension Mother   ? Heart disease Mother   ?     Rheumatic fever  ? Rheumatic fever Mother   ? Other Mother   ?     brain tumor  ? Cancer Mother   ?     leukemia  ? Hypertension Father   ? Alcohol abuse Father   ? Diabetes Son   ? Colon cancer Neg Hx   ? Breast cancer Neg Hx   ? Prostate cancer Neg Hx   ? ? ?Social History  ? ?Socioeconomic History  ? Marital status: Widowed  ?  Spouse name: Not on file  ? Number of children: 3  ? Years of education: Not on file  ?  Highest education level: Not on file  ?Occupational History  ? Occupation: Retired  ?  Comment: Retired  ?Tobacco Use  ? Smoking status: Former  ?  Packs/day: 0.50  ?  Years: 42.00  ?  Pack years: 21.00  ?  Types: Cigarettes  ?  Start date: 10/30/1967  ?  Quit date: 04/14/2016  ?  Years since quitting: 5.9  ? Smokeless tobacco: Never  ?Vaping Use  ? Vaping Use: Never used  ?Substance and Sexual Activity  ? Alcohol use: No  ?  Alcohol/week: 0.0 standard drinks  ?  Comment: Rare  ? Drug use: No  ? Sexual activity: Never  ?  Comment: wife died in 01/26/23 married 54 years, grandson livew with patient  ?Other Topics Concern  ?  Not on file  ?Social History Narrative  ? Not on file  ? ?Social Determinants of Health  ? ?Financial Resource Strain: Not on file  ?Food Insecurity: Not on file  ?Transportation Needs: Not on file  ?Physical Activity: Not on file  ?Stress: Not on file  ?Social Connections: Not on file  ?Intimate Partner Violence: Not on file  ? ? ?Review of systems: ?Review of Systems  ?Constitutional: Negative for fever and chills.  ?HENT: Negative.   ?Eyes: Negative for blurred vision.  ?Respiratory: as per HPI  ?Cardiovascular: Negative for chest pain and palpitations.  ?Gastrointestinal: Negative for vomiting, diarrhea, blood per rectum. ?Genitourinary: Negative for dysuria, urgency, frequency and hematuria.  ?Musculoskeletal: Negative for myalgias, back pain and joint pain.  ?Skin: Negative for itching and rash.  ?Neurological: Negative for dizziness, tremors, focal weakness, seizures and loss of consciousness.  ?Endo/Heme/Allergies: Negative for environmental allergies.  ?Psychiatric/Behavioral: Negative for depression, suicidal ideas and hallucinations.  ?All other systems reviewed and are negative. ? ?Physical Exam: ?Blood pressure 136/78, pulse 82, temperature 97.6 ?F (36.4 ?C), temperature source Oral, height '5\' 8"'$  (1.727 m), weight 179 lb 6.4 oz (81.4 kg), SpO2 94 %. ?Gen:      No acute distress ?HEENT:   EOMI, sclera anicteric ?Neck:     No masses; no thyromegaly ?Lungs:    Clear to auscultation bilaterally; normal respiratory effort ?CV:         Regular rate and rhythm; no murmurs ?Abd:      + bowel sounds; so

## 2022-03-10 NOTE — Progress Notes (Signed)
PFT done today. 

## 2022-03-10 NOTE — Patient Instructions (Signed)
I reviewed your CT scan which shows scarring in the lung of unclear reason ?We will get some additional labs to investigate.  We will also give you a questionnaire to fill out ?I will review your CT at our conference with colleagues ?Return to clinic in 1 month for reassessment and plan for next ?

## 2022-03-15 LAB — HYPERSENSITIVITY PNEUMONITIS
A. Pullulans Abs: NEGATIVE
A.Fumigatus #1 Abs: NEGATIVE
Micropolyspora faeni, IgG: NEGATIVE
Pigeon Serum Abs: NEGATIVE
Thermoact. Saccharii: NEGATIVE
Thermoactinomyces vulgaris, IgG: NEGATIVE

## 2022-03-17 ENCOUNTER — Encounter: Payer: Self-pay | Admitting: Cardiology

## 2022-03-17 ENCOUNTER — Ambulatory Visit (INDEPENDENT_AMBULATORY_CARE_PROVIDER_SITE_OTHER): Payer: Medicare Other | Admitting: Cardiology

## 2022-03-17 ENCOUNTER — Other Ambulatory Visit: Payer: Self-pay

## 2022-03-17 VITALS — BP 132/76 | HR 62 | Ht 68.0 in | Wt 186.1 lb

## 2022-03-17 DIAGNOSIS — I351 Nonrheumatic aortic (valve) insufficiency: Secondary | ICD-10-CM

## 2022-03-17 DIAGNOSIS — I5022 Chronic systolic (congestive) heart failure: Secondary | ICD-10-CM | POA: Diagnosis not present

## 2022-03-17 DIAGNOSIS — I480 Paroxysmal atrial fibrillation: Secondary | ICD-10-CM | POA: Diagnosis not present

## 2022-03-17 DIAGNOSIS — I10 Essential (primary) hypertension: Secondary | ICD-10-CM

## 2022-03-17 DIAGNOSIS — I2510 Atherosclerotic heart disease of native coronary artery without angina pectoris: Secondary | ICD-10-CM

## 2022-03-17 DIAGNOSIS — I714 Abdominal aortic aneurysm, without rupture, unspecified: Secondary | ICD-10-CM

## 2022-03-17 NOTE — Patient Instructions (Signed)
Medication Instructions:  ? ?STOP AMIODARONE ? ?STOP ASPIRIN ? ?*If you need a refill on your cardiac medications before your next appointment, please call your pharmacy* ? ? ?Follow-Up: ?At [facility], you and your health needs are our priority.  As part of our continuing mission to provide you with exceptional heart care, we have created designated Provider Care Teams.  These Care Teams include your primary Cardiologist (physician) and Advanced Practice Providers (APPs -  Physician Assistants and Nurse Practitioners) who all work together to provide you with the care you need, when you need it. ? ?We recommend signing up for the patient portal called "MyChart".  Sign up information is provided on this After Visit Summary.  MyChart is used to connect with patients for Virtual Visits (Telemedicine).  Patients are able to view lab/test results, encounter notes, upcoming appointments, etc.  Non-urgent messages can be sent to your provider as well.   ?To learn more about what you can do with MyChart, go to [email or web address].   ? ?Your next appointment:   ?3 month(s) ? ?The format for your next appointment:   ?In Person ? ?Provider:   ?Kirk Ruths, MD  ? ? ? ?

## 2022-03-18 LAB — SJOGREN'S SYNDROME ANTIBODS(SSA + SSB)
SSA (Ro) (ENA) Antibody, IgG: <1 AI
SSB (La) (ENA) Antibody, IgG: <1 AI

## 2022-03-18 LAB — ANA,IFA RA DIAG PNL W/RFLX TIT/PATN
Anti Nuclear Antibody (ANA): POSITIVE — AB
Cyclic Citrullin Peptide Ab: <16 UNITS
Rheumatoid fact SerPl-aCnc: <14 IU/mL (ref <14)

## 2022-03-18 LAB — ANTI-NUCLEAR AB-TITER (ANA TITER): ANA Titer 1: 1:40 {titer} — ABNORMAL HIGH

## 2022-03-18 LAB — ANCA SCREEN W REFLEX TITER: ANCA SCREEN: NEGATIVE

## 2022-03-18 LAB — ANTI-SCLERODERMA ANTIBODY: Scleroderma (Scl-70) (ENA) Antibody, IgG: <1 AI

## 2022-03-22 ENCOUNTER — Encounter: Payer: Medicare Other | Admitting: Cardiology

## 2022-03-23 ENCOUNTER — Telehealth: Payer: Self-pay | Admitting: Family Medicine

## 2022-03-23 NOTE — Telephone Encounter (Signed)
Left message for patient to call back and schedule Medicare Annual Wellness Visit (AWV) in office.  ? ?If not able to come in office, please offer to do virtually or by telephone.  Left office number and my jabber [phone]. ? ?Last AWV:03/05/2021 ? ?Please schedule at anytime with Nurse Health Advisor. ?  ?

## 2022-03-25 LAB — MYOMARKER 3 PLUS PROFILE (RDL)

## 2022-04-01 ENCOUNTER — Encounter: Payer: Self-pay | Admitting: *Deleted

## 2022-04-02 ENCOUNTER — Ambulatory Visit: Payer: Medicare Other | Admitting: Family

## 2022-04-02 ENCOUNTER — Inpatient Hospital Stay: Payer: Medicare Other | Admitting: Hematology & Oncology

## 2022-04-02 ENCOUNTER — Inpatient Hospital Stay: Payer: Medicare Other

## 2022-04-14 ENCOUNTER — Encounter: Payer: Self-pay | Admitting: Hematology & Oncology

## 2022-04-14 ENCOUNTER — Inpatient Hospital Stay (HOSPITAL_BASED_OUTPATIENT_CLINIC_OR_DEPARTMENT_OTHER): Payer: Medicare Other | Admitting: Hematology & Oncology

## 2022-04-14 ENCOUNTER — Telehealth: Payer: Self-pay | Admitting: Cardiology

## 2022-04-14 ENCOUNTER — Other Ambulatory Visit: Payer: Self-pay

## 2022-04-14 ENCOUNTER — Inpatient Hospital Stay: Payer: Medicare Other | Attending: Hematology & Oncology

## 2022-04-14 VITALS — BP 131/73 | HR 65 | Temp 97.9°F | Resp 20 | Ht 68.0 in | Wt 188.0 lb

## 2022-04-14 DIAGNOSIS — Z7901 Long term (current) use of anticoagulants: Secondary | ICD-10-CM | POA: Diagnosis not present

## 2022-04-14 DIAGNOSIS — C9210 Chronic myeloid leukemia, BCR/ABL-positive, not having achieved remission: Secondary | ICD-10-CM | POA: Diagnosis present

## 2022-04-14 DIAGNOSIS — Z79899 Other long term (current) drug therapy: Secondary | ICD-10-CM | POA: Insufficient documentation

## 2022-04-14 DIAGNOSIS — C9201 Acute myeloblastic leukemia, in remission: Secondary | ICD-10-CM | POA: Insufficient documentation

## 2022-04-14 DIAGNOSIS — I480 Paroxysmal atrial fibrillation: Secondary | ICD-10-CM | POA: Insufficient documentation

## 2022-04-14 LAB — CBC WITH DIFFERENTIAL (CANCER CENTER ONLY)
Abs Immature Granulocytes: 0.03 10*3/uL (ref 0.00–0.07)
Basophils Absolute: 0.1 10*3/uL (ref 0.0–0.1)
Basophils Relative: 1 %
Eosinophils Absolute: 0.2 10*3/uL (ref 0.0–0.5)
Eosinophils Relative: 3 %
HCT: 41.9 % (ref 39.0–52.0)
Hemoglobin: 13 g/dL (ref 13.0–17.0)
Immature Granulocytes: 0 %
Lymphocytes Relative: 11 %
Lymphs Abs: 1 10*3/uL (ref 0.7–4.0)
MCH: 30.1 pg (ref 26.0–34.0)
MCHC: 31 g/dL (ref 30.0–36.0)
MCV: 97 fL (ref 80.0–100.0)
Monocytes Absolute: 0.6 10*3/uL (ref 0.1–1.0)
Monocytes Relative: 7 %
Neutro Abs: 6.8 10*3/uL (ref 1.7–7.7)
Neutrophils Relative %: 78 %
Platelet Count: 208 10*3/uL (ref 150–400)
RBC: 4.32 MIL/uL (ref 4.22–5.81)
RDW: 15.9 % — ABNORMAL HIGH (ref 11.5–15.5)
WBC Count: 8.8 10*3/uL (ref 4.0–10.5)
nRBC: 0 % (ref 0.0–0.2)

## 2022-04-14 LAB — CMP (CANCER CENTER ONLY)
ALT: 8 U/L (ref 0–44)
AST: 16 U/L (ref 15–41)
Albumin: 3.9 g/dL (ref 3.5–5.0)
Alkaline Phosphatase: 80 U/L (ref 38–126)
Anion gap: 13 (ref 5–15)
BUN: 11 mg/dL (ref 8–23)
CO2: 24 mmol/L (ref 22–32)
Calcium: 9.4 mg/dL (ref 8.9–10.3)
Chloride: 114 mmol/L — ABNORMAL HIGH (ref 98–111)
Creatinine: 1.32 mg/dL — ABNORMAL HIGH (ref 0.61–1.24)
GFR, Estimated: 57 mL/min — ABNORMAL LOW (ref >60)
Glucose, Bld: 108 mg/dL — ABNORMAL HIGH (ref 70–99)
Potassium: 3.9 mmol/L (ref 3.5–5.1)
Sodium: 151 mmol/L — ABNORMAL HIGH (ref 135–145)
Total Bilirubin: 0.8 mg/dL (ref 0.3–1.2)
Total Protein: 6.6 g/dL (ref 6.5–8.1)

## 2022-04-14 LAB — LACTATE DEHYDROGENASE: LDH: 292 U/L — ABNORMAL HIGH (ref 98–192)

## 2022-04-14 LAB — SAVE SMEAR(SSMR), FOR PROVIDER SLIDE REVIEW

## 2022-04-14 MED ORDER — METOPROLOL SUCCINATE ER 200 MG PO TB24: 200.0000 mg | ORAL_TABLET | Freq: Every day | ORAL | 3 refills | Status: AC

## 2022-04-14 MED ORDER — METOPROLOL SUCCINATE ER 200 MG PO TB24: 200.0000 mg | ORAL_TABLET | Freq: Every day | ORAL | 3 refills | Status: DC

## 2022-04-14 NOTE — Addendum Note (Signed)
Addended by: Truddie Hidden on: 04/14/2022 12:55 PM ? ? Modules accepted: Orders ? ?

## 2022-04-14 NOTE — Progress Notes (Signed)
?Hematology and Oncology Follow Up Visit ? ?Jason [name] ?[id number] ?1946-05-21 76 y.o. ?04/14/2022 ? ? ?Principle Diagnosis:  ?Chronic phase CML - MMR ?Paroxysmal atrial fibrillation - has pacemaker  ? ?Current Therapy:   ?[name] 400 mg by mouth daily -- started on 09/2019 ?Xarelto 20 mg by mouth daily ?  ?Interim History:  Jason [name] is here today for follow-up.  He is still bothered by pulmonary issues.  He will see a pulmonologist this Friday.  He is wearing oxygen. ? ?I did say that he is having problems with his breathing. ? ?He had an echocardiogram done back in February which showed a ejection fraction of 30-35%. ? ?CT angiogram that was done also in February.  This did not show any pulmonary embolism.  It did show some interstitial changes. ? ?Again, I would think that the pulmonologist will be able to sort everything out. ? ?As far as the CML is concerned, his last BCR/ABL was 0.11%.  This is up a little bit.  We will have to watch this. ? ?I think he is still suffering from the [place] that he had. ? ?His atrial fibrillation is paroxysmal.  He has a pacemaker in.  He is on Xarelto. ? ?He has had no fever.  He has had no rashes.  He has had no leg swelling. ? ?His appetite is doing okay. ? ?Overall, I would say his performance status is probably ECOG 1.    ? ?Medications:  ?Allergies as of 04/14/2022   ?No Known Allergies ?  ? ?  ?Medication List  ?  ? ?  ? Accurate as of April 14, 2022 11:25 AM. If you have any questions, ask your nurse or doctor.  ?  ?  ? ?  ? ?albuterol 108 (90 Base) MCG/ACT inhaler ?Commonly known as: VENTOLIN HFA ?Inhale 2 puffs into the lungs every 6 (six) hours as needed. ?What changed: reasons to take this ?  ?amiodarone 200 MG tablet ?Commonly known as: PACERONE ?Take 100 mg by mouth daily. ?  ?atorvastatin 80 MG tablet ?Commonly known as: Lipitor ?Take 1 tablet (80 mg total) by mouth daily. ?  ?atorvastatin 40 MG tablet ?Commonly known as: LIPITOR ?Take 40 mg by mouth daily. ?   ?ferrous sulfate 325 (65 FE) MG EC tablet ?Take 1 tablet (325 mg total) by mouth 2 (two) times daily. ?What changed: when to take this ?  ?fluticasone 50 MCG/ACT nasal spray ?Commonly known as: FLONASE ?Place 2 sprays into both nostrils daily. ?  ?furosemide 40 MG tablet ?Commonly known as: LASIX ?Take 1 tablet (40 mg total) by mouth daily. ?  ?imatinib 400 MG tablet ?Commonly known as: [name] ?TAKE 1 TABLET (400MG TOTAL) BY MOUTH DAILY. TAKE WITH A MEAL AND LARGE GLASS OF WATER. ?What changed: See the new instructions. ?  ?Jardiance 10 MG Tabs tablet ?Generic drug: empagliflozin ?Take 1 tablet (10 mg total) by mouth daily. ?  ?levothyroxine 25 MCG tablet ?Commonly known as: SYNTHROID ?Take 1 tablet (25 mcg total) by mouth daily before breakfast. ?  ?losartan 100 MG tablet ?Commonly known as: COZAAR ?Take 1 tablet (100 mg total) by mouth daily. ?  ?magnesium oxide 400 MG tablet ?Commonly known as: MAG-OX ?Take 1 tablet (400 mg total) by mouth daily. Except Tuesday and Saturday take it BID. ?What changed:  ?how much to take ?when to take this ?additional instructions ?  ?meclizine 25 MG tablet ?Commonly known as: ANTIVERT ?TAKE 1 TABLET THREE TIMES DAILY ?What changed:  ?  when to take this ?reasons to take this ?  ?metoprolol 200 MG 24 hr tablet ?Commonly known as: TOPROL-XL ?Take 1 tablet (200 mg total) by mouth daily. Take with or immediately following a meal. ?  ?nitroGLYCERIN 0.4 MG SL tablet ?Commonly known as: NITROSTAT ?Place 1 tablet (0.4 mg total) under the tongue every 5 (five) minutes as needed for chest pain. ?  ?omeprazole 40 MG capsule ?Commonly known as: PRILOSEC ?Take 1 capsule (40 mg total) by mouth daily. ?  ?ondansetron 4 MG tablet ?Commonly known as: ZOFRAN ?Take 1 tablet (4 mg total) by mouth 2 (two) times daily as needed for nausea or vomiting. ?  ?Probiotic 250 MG Caps ?Take 250 mg by mouth daily. ?  ?vitamin B-12 500 MCG tablet ?Commonly known as: CYANOCOBALAMIN ?Take 500 mcg by mouth  daily. ?  ?Xarelto 20 MG Tabs tablet ?Generic drug: rivaroxaban ?TAKE 1 TABLET BY MOUTH ONCE DAILY WITH SUPPER ?What changed:  ?how much to take ?how to take this ?when to take this ?  ? ?  ? ? ?Allergies: No Known Allergies ? ?Past Medical History, Surgical history, Social history, and Family History were reviewed and updated. ? ?Review of Systems: ?Review of Systems  ?Constitutional: Negative.   ?HENT: Negative.    ?Eyes: Negative.   ?Respiratory: Negative.    ?Cardiovascular: Negative.   ?Gastrointestinal: Negative.   ?Genitourinary: Negative.   ?Musculoskeletal: Negative.   ?Skin: Negative.   ?Neurological: Negative.   ?Endo/Heme/Allergies: Negative.   ?Psychiatric/Behavioral: Negative.    ? ? ?Physical Exam: ? height is _0  (1.727 m) and weight is 188 lb (85.3 kg). His oral temperature is 97.9 ?F (36.6 ?C). His blood pressure is 131/73 and his pulse is 65. His respiration is 20 and oxygen saturation is 98%.  ? ?Wt Readings from Last 3 Encounters:  ?04/14/22 188 lb (85.3 kg)  ?03/17/22 186 lb 1.9 oz (84.4 kg)  ?03/10/22 179 lb 6.4 oz (81.4 kg)  ? ? ?Physical Exam ?Vitals reviewed.  ?HENT:  ?   Head: Normocephalic and atraumatic.  ?Eyes:  ?   Pupils: Pupils are equal, round, and reactive to light.  ?Cardiovascular:  ?   Rate and Rhythm: Normal rate and regular rhythm.  ?   Heart sounds: Normal heart sounds.  ?Pulmonary:  ?   Effort: Pulmonary effort is normal.  ?   Breath sounds: Normal breath sounds.  ?Abdominal:  ?   General: Bowel sounds are normal.  ?   Palpations: Abdomen is soft.  ?Musculoskeletal:     ?   General: No tenderness or deformity. Normal range of motion.  ?   Cervical back: Normal range of motion.  ?Lymphadenopathy:  ?   Cervical: No cervical adenopathy.  ?Skin: ?   General: Skin is warm and dry.  ?   Findings: No erythema or rash.  ?Neurological:  ?   Mental Status: He is alert and oriented to person, place, and time.  ?Psychiatric:     ?   Behavior: Behavior normal.     ?   Thought Content:  Thought content normal.     ?   Judgment: Judgment normal.  ? ? ? ?Lab Results  ?Component Value Date  ? WBC 8.8 04/14/2022  ? HGB 13.0 04/14/2022  ? HCT 41.9 04/14/2022  ? MCV 97.0 04/14/2022  ? PLT 208 04/14/2022  ? ?Lab Results  ?Component Value Date  ? FERRITIN 410 (H) 12/16/2021  ? IRON 84 05/27/2015  ? TIBC 359 05/27/2015  ?  UIBC 275 05/27/2015  ? IRONPCTSAT 23 05/27/2015  ? ?Lab Results  ?Component Value Date  ? RETICCTPCT 1.2 05/27/2015  ? RBC 4.32 04/14/2022  ? RETICCTABS 48.2 05/27/2015  ? ?No results found for: KPAFRELGTCHN, LAMBDASER, KAPLAMBRATIO ?No results found for: IGGSERUM, IGA, IGMSERUM ?No results found for: TOTALPROTELP, ALBUMINELP, A1GS, A2GS, BETS, BETA2SER, GAMS, MSPIKE, SPEI ?  Chemistry   ?   ?Component Value Date/Time  ? NA 151 (H) 04/14/2022 0951  ? NA 141 12/13/2019 0955  ? NA 143 11/26/2016 1030  ? K 3.9 04/14/2022 0951  ? K 3.8 11/26/2016 1030  ? CL 114 (H) 04/14/2022 0951  ? CL 109 (H) 03/31/2015 1119  ? CO2 24 04/14/2022 0951  ? CO2 25 11/26/2016 1030  ? BUN 11 04/14/2022 0951  ? BUN 13 12/13/2019 0955  ? BUN 5.1 (L) 11/26/2016 1030  ? CREATININE 1.32 (H) 04/14/2022 0951  ? CREATININE 1.23 (H) 09/11/2020 1125  ? CREATININE 1.0 11/26/2016 1030  ?    ?Component Value Date/Time  ? CALCIUM 9.4 04/14/2022 0951  ? CALCIUM 9.3 11/26/2016 1030  ? ALKPHOS 80 04/14/2022 0951  ? ALKPHOS 106 11/26/2016 1030  ? AST 16 04/14/2022 0951  ? AST 16 11/26/2016 1030  ? ALT 8 04/14/2022 0951  ? ALT 10 11/26/2016 1030  ? BILITOT 0.8 04/14/2022 0951  ? BILITOT 0.53 11/26/2016 1030  ?  ? ? ?Impression and Plan: Jason [name] is a very pleasant 76yo caucasian male with chronic phase CML.  He was only off [place] for about 4 months before he recurred. ? ?We will have to see what the BCR/ABL shows.  If this is higher, we may have to make a switch over to a different TKI. ? ?Again his real problem is the breathing.  I just feel bad that he has this. ? ?We will have to get him back in another 3 months. ? ?Volanda Napoleon, MD ?4/19/202311:25 AM ?

## 2022-04-14 NOTE — Telephone Encounter (Signed)
Patient is requesting a refill on his metoprolol. He says he's been taking 200 mg and ran out quickly. Please send into [facility] on [address] in [place]. CB # [phone] ?

## 2022-04-16 ENCOUNTER — Ambulatory Visit (INDEPENDENT_AMBULATORY_CARE_PROVIDER_SITE_OTHER): Payer: Medicare Other | Admitting: Pulmonary Disease

## 2022-04-16 ENCOUNTER — Ambulatory Visit (INDEPENDENT_AMBULATORY_CARE_PROVIDER_SITE_OTHER): Payer: Medicare Other

## 2022-04-16 ENCOUNTER — Encounter: Payer: Self-pay | Admitting: Pulmonary Disease

## 2022-04-16 VITALS — BP 108/62 | HR 62 | Ht 68.0 in | Wt 192.2 lb

## 2022-04-16 DIAGNOSIS — J849 Interstitial pulmonary disease, unspecified: Secondary | ICD-10-CM | POA: Diagnosis not present

## 2022-04-16 DIAGNOSIS — J9601 Acute respiratory failure with hypoxia: Secondary | ICD-10-CM

## 2022-04-16 DIAGNOSIS — I495 Sick sinus syndrome: Secondary | ICD-10-CM | POA: Diagnosis not present

## 2022-04-16 MED ORDER — PREDNISONE 20 MG PO TABS: ORAL_TABLET | ORAL | 2 refills | Status: AC

## 2022-04-16 MED ORDER — SULFAMETHOXAZOLE-TRIMETHOPRIM 800-160 MG PO TABS: 1.0000 | ORAL_TABLET | ORAL | 5 refills | Status: AC

## 2022-04-16 NOTE — Patient Instructions (Addendum)
We will check your oxygen levels to see if you qualify for portable concentrator ?Start prednisone 40 mg a day and Bactrim double strength 3 times a week ?Will order high-res CT and follow-up in clinic in 1 to 2 months ?

## 2022-04-16 NOTE — Progress Notes (Signed)
? ?      ?Jason [name]    [id number]    December 22, 1946 ? ?Primary Care Physician:Blyth, Bonnita Levan, MD ? ?Referring Physician: Mosie Lukes, MD ?[address] ?[address] ?[place],  [place] [address] ? ?Chief complaint: Follow-up for interstitial lung disease ? ?HPI: ?76 year old man with hx of smoking, CLL on gleevec, COPD diagnosis without PFTs, Afib on amio/xarelto ?Was hospitalized for COVID in December 2022, treated with remdesivir and steroids.  Hospitalized again and February 2023 with acute on chronic respiratory failure felt to be secondary to COPD exacerbation, decompensated systolic/diastolic heart failure.  CT imaging at that time showed interstitial lung disease and PCCM was consulted ? ?Post discharge he continues on supplemental oxygen.  Complains of dyspnea on exertion that started with COVID infection.  Denies any cough, sputum production, fevers, ? ?Pets: No pets ?Occupation: Retired Chemical engineer ?Exposures: No mold, hot tub, Jacuzzi.  No feather pillows or comforters ?Smoking history: 25-pack-year smoker.  Quit in 2017 ?Travel history: Originally from [place].  No significant recent ?Relevant family history: No family history of lung ? ?Interim history: ?Continues to have dyspnea on exertion.  For some reason his supplemental oxygen has not been filled and he is hypoxic in office today. ? ?Taken off amiodarone by his cardiologist.  He continues on ? ? ?Outpatient Encounter Medications as of 04/16/2022  ?Medication Sig  ? albuterol (VENTOLIN HFA) 108 (90 Base) MCG/ACT inhaler Inhale 2 puffs into the lungs every 6 (six) hours as needed. (Patient taking differently: Inhale 2 puffs into the lungs every 6 (six) hours as needed for shortness of breath.)  ? amiodarone (PACERONE) 200 MG tablet Take 100 mg by mouth daily.  ? atorvastatin (LIPITOR) 40 MG tablet Take 40 mg by mouth daily.  ? atorvastatin (LIPITOR) 80 MG tablet Take 1 tablet (80 mg total) by mouth daily.  ? empagliflozin (JARDIANCE) 10  MG TABS tablet Take 1 tablet (10 mg total) by mouth daily.  ? ferrous sulfate 325 (65 FE) MG EC tablet Take 1 tablet (325 mg total) by mouth 2 (two) times daily. (Patient taking differently: Take 325 mg by mouth daily.)  ? fluticasone (FLONASE) 50 MCG/ACT nasal spray Place 2 sprays into both nostrils daily.  ? furosemide (LASIX) 40 MG tablet Take 1 tablet (40 mg total) by mouth daily.  ? imatinib ([name]) 400 MG tablet TAKE 1 TABLET ('400MG'$  TOTAL) BY MOUTH DAILY. TAKE WITH A MEAL AND LARGE GLASS OF WATER. (Patient taking differently: Take 400 mg by mouth daily.)  ? levothyroxine (SYNTHROID) 25 MCG tablet Take 1 tablet (25 mcg total) by mouth daily before breakfast.  ? losartan (COZAAR) 100 MG tablet Take 1 tablet (100 mg total) by mouth daily.  ? magnesium oxide (MAG-OX) 400 MG tablet Take 1 tablet (400 mg total) by mouth daily. Except Tuesday and Saturday take it BID. (Patient taking differently: Take 400-800 mg by mouth See admin instructions. Take one tablet by mouth every day except on Tuesdays and Saturdays take two tablets by mouth per patient)  ? meclizine (ANTIVERT) 25 MG tablet TAKE 1 TABLET THREE TIMES DAILY (Patient taking differently: Take 25 mg by mouth 3 (three) times daily as needed for dizziness.)  ? metoprolol (TOPROL-XL) 200 MG 24 hr tablet Take 1 tablet (200 mg total) by mouth daily. Take with or immediately following a meal.  ? omeprazole (PRILOSEC) 40 MG capsule Take 1 capsule (40 mg total) by mouth daily.  ? ondansetron (ZOFRAN) 4 MG  tablet Take 1 tablet (4 mg total) by mouth 2 (two) times daily as needed for nausea or vomiting.  ? rivaroxaban (XARELTO) 20 MG TABS tablet TAKE 1 TABLET BY MOUTH ONCE DAILY WITH SUPPER (Patient taking differently: 20 mg daily.)  ? Saccharomyces boulardii (PROBIOTIC) 250 MG CAPS Take 250 mg by mouth daily.  ? vitamin B-12 (CYANOCOBALAMIN) 500 MCG tablet Take 500 mcg by mouth daily.  ? nitroGLYCERIN (NITROSTAT) 0.4 MG SL tablet Place 1 tablet (0.4 mg total) under  the tongue every 5 (five) minutes as needed for chest pain. (Patient not taking: Reported on 04/16/2022)  ? ?Facility-Administered Encounter Medications as of 04/16/2022  ?Medication  ? cyanocobalamin ((VITAMIN B-12)) injection 1,000 mcg  ? ?Physical Exam: ?Blood pressure 108/62, pulse 62, height '5\' 8"'$  (1.727 m), weight 192 lb 3.2 oz (87.2 kg), SpO2 90 %. ?Gen:      No acute distress ?HEENT:  EOMI, sclera anicteric ?Neck:     No masses; no thyromegaly ?Lungs:    Clear to auscultation bilaterally; normal respiratory effort ?CV:         Regular rate and rhythm; no murmurs ?Abd:      + bowel sounds; soft, non-tender; no palpable masses, no distension ?Ext:    No edema; adequate peripheral perfusion ?Skin:      Warm and dry; no rash ?Neuro: alert and oriented x 3 ?Psych: normal mood and affect  ? ?Data Reviewed: ?Imaging: ?CTA 02/15/2022-no pulmonary embolism, fibrotic changes right greater than left with widespread groundglass degeneration, septal thickening and peripheral bronchiolectasis.  Emphysema.  I have reviewed the images personally ? ?PFTs: ?03/10/2022 ?FVC 3.55 [89%], FEV1 2.54 [87%], F/F 71, TLC 5.24 [78%], DLCO 4.63 [19%] ?Minimal restriction, severe diffusion ? ?Labs: ?CTD serologies 03/10/2022-ANA 1: 40 nuclear ? ?Assessment:  ?Consult for ILD ?His CT scan shows alternate pattern findings with predominant groundglass opacities and right greater than left changes.  He did have COVID infection in December 2022 but his CT findings have preceded that and or worsened.  He may have underlying interstitial lung disease that was exacerbated by COVID.  Also noted to be on [name] and amiodarone that can cause interstitial changes but findings are not typical.  He is now off amiodarone ? ?ILD serology shows a nonspecific elevation in ANA.  He does not have symptoms of connective tissue disease for connective tissue disease ?We are awaiting discussion at multidisciplinary conference ?Overall he is poor candidate for lung  biopsy due to cardiomyopathy, reduced DLCO and pulmonary hypertension ? ?We will discuss with his oncologist if there are alternatives for [place] ?Get high-res CT ?Start prednisone 40 mg a day and Bactrim for prophylaxis ? ?Plan/Recommendations: ?High-res CT, prednisone and Bactrim ? ?Marshell Garfinkel MD ?[facility] ?04/16/2022, 9:46 AM ? ?CC: Mosie Lukes, MD ? ?  ?

## 2022-04-19 LAB — CUP PACEART REMOTE DEVICE CHECK
Battery Remaining Longevity: 41 mo
Battery Voltage: 2.98 V
Brady Statistic AP VP Percent: 1.26 %
Brady Statistic AP VS Percent: 84.63 %
Brady Statistic AS VP Percent: 0.7 %
Brady Statistic AS VS Percent: 13.41 %
Brady Statistic RA Percent Paced: 62.26 %
Brady Statistic RV Percent Paced: 1.76 %
Date Time Interrogation Session: 20230422104711
Implantable Lead Implant Date: 20170420
Implantable Lead Implant Date: 20170420
Implantable Lead Location: 753859
Implantable Lead Location: 753860
Implantable Lead Model: 5076
Implantable Lead Model: 5076
Implantable Pulse Generator Implant Date: 20170420
Lead Channel Impedance Value: 285 Ohm
Lead Channel Impedance Value: 323 Ohm
Lead Channel Impedance Value: 380 Ohm
Lead Channel Impedance Value: 418 Ohm
Lead Channel Pacing Threshold Amplitude: 0.625 V
Lead Channel Pacing Threshold Amplitude: 0.875 V
Lead Channel Pacing Threshold Pulse Width: 0.4 ms
Lead Channel Pacing Threshold Pulse Width: 0.4 ms
Lead Channel Sensing Intrinsic Amplitude: 1.25 mV
Lead Channel Sensing Intrinsic Amplitude: 1.25 mV
Lead Channel Sensing Intrinsic Amplitude: 9.125 mV
Lead Channel Sensing Intrinsic Amplitude: 9.125 mV
Lead Channel Setting Pacing Amplitude: 2 V
Lead Channel Setting Pacing Amplitude: 2.5 V
Lead Channel Setting Pacing Pulse Width: 0.4 ms
Lead Channel Setting Sensing Sensitivity: 2 mV

## 2022-04-22 ENCOUNTER — Telehealth: Payer: Self-pay

## 2022-04-22 LAB — BCR/ABL

## 2022-04-22 NOTE — Telephone Encounter (Signed)
-----   Message from Volanda Napoleon, MD sent at 04/22/2022  9:52 AM EDT ----- ?Please call and let him know that the chronic leukemia still in remission.  Thanks.  Jason [name] ?

## 2022-04-22 NOTE — Telephone Encounter (Signed)
Advised via MyChart.

## 2022-05-02 ENCOUNTER — Other Ambulatory Visit: Payer: Self-pay | Admitting: Hematology & Oncology

## 2022-05-02 DIAGNOSIS — C9210 Chronic myeloid leukemia, BCR/ABL-positive, not having achieved remission: Secondary | ICD-10-CM

## 2022-05-04 NOTE — Progress Notes (Signed)
Remote pacemaker transmission.   

## 2022-05-07 ENCOUNTER — Telehealth: Payer: Self-pay | Admitting: Family Medicine

## 2022-05-10 ENCOUNTER — Ambulatory Visit: Payer: Medicare Other

## 2022-05-10 NOTE — Telephone Encounter (Signed)
Spoke with patients grandaughter and she stated that EMS came out to the house on Friday and they worked on him for an hour and could not keep a pulse. ?

## 2022-05-10 NOTE — Progress Notes (Deleted)
Subjective:   Jason [name] is a 76 y.o. male who presents for Medicare Annual/Subsequent preventive examination.  I connected with Sevan today by telephone and verified that I am speaking with the correct person using two identifiers. Location patient: home Location provider: work Persons participating in the virtual visit: patient, Marine scientist.    I discussed the limitations, risks, security and privacy concerns of performing an evaluation and management service by telephone and the availability of in person appointments. I also discussed with the patient that there may be a patient responsible charge related to this service. The patient expressed understanding and verbally consented to this telephonic visit.    Interactive audio and video telecommunications were attempted between this provider and patient, however failed, due to patient having technical difficulties OR patient did not have access to video capability.  We continued and completed visit with audio only.  Some vital signs may be absent or patient reported.   Time Spent with patient on telephone encounter: *** minutes   Review of Systems    ***       Objective:    There were no vitals filed for this visit. There is no height or weight on file to calculate BMI.     04/14/2022   11:04 AM 02/15/2022    2:56 PM 01/01/2022   10:58 AM 12/15/2021   10:19 AM 07/31/2021    4:38 PM 03/05/2021   10:24 AM 01/02/2021   10:38 AM  Advanced Directives  Does Patient Have a Medical Advance Directive? Yes Yes No No No No No  Type of Advance Directive Living will Living will       Does patient want to make changes to medical advance directive? No - Patient declined No - Patient declined       Copy of Bartholomew in Chart?  No - copy requested       Would patient like information on creating a medical advance directive? No - Patient declined  No - Patient declined  No - Patient declined No - Patient declined No - Patient  declined    Current Medications (verified) Outpatient Encounter Medications as of 05/10/2022  Medication Sig   albuterol (VENTOLIN HFA) 108 (90 Base) MCG/ACT inhaler Inhale 2 puffs into the lungs every 6 (six) hours as needed. (Patient taking differently: Inhale 2 puffs into the lungs every 6 (six) hours as needed for shortness of breath.)   amiodarone (PACERONE) 200 MG tablet Take 100 mg by mouth daily.   atorvastatin (LIPITOR) 40 MG tablet Take 40 mg by mouth daily.   atorvastatin (LIPITOR) 80 MG tablet Take 1 tablet (80 mg total) by mouth daily.   empagliflozin (JARDIANCE) 10 MG TABS tablet Take 1 tablet (10 mg total) by mouth daily.   ferrous sulfate 325 (65 FE) MG EC tablet Take 1 tablet (325 mg total) by mouth 2 (two) times daily. (Patient taking differently: Take 325 mg by mouth daily.)   fluticasone (FLONASE) 50 MCG/ACT nasal spray Place 2 sprays into both nostrils daily.   furosemide (LASIX) 40 MG tablet Take 1 tablet (40 mg total) by mouth daily.   imatinib ([name]) 400 MG tablet TAKE 1 TABLET ('400MG'$  TOTAL) BY MOUTH DAILY. TAKE WITH A MEAL AND LARGE GLASS OF WATER.   levothyroxine (SYNTHROID) 25 MCG tablet Take 1 tablet (25 mcg total) by mouth daily before breakfast.   losartan (COZAAR) 100 MG tablet Take 1 tablet (100 mg total) by mouth daily.   magnesium oxide (  MAG-OX) 400 MG tablet Take 1 tablet (400 mg total) by mouth daily. Except Tuesday and Saturday take it BID. (Patient taking differently: Take 400-800 mg by mouth See admin instructions. Take one tablet by mouth every day except on Tuesdays and Saturdays take two tablets by mouth per patient)   meclizine (ANTIVERT) 25 MG tablet TAKE 1 TABLET THREE TIMES DAILY (Patient taking differently: Take 25 mg by mouth 3 (three) times daily as needed for dizziness.)   metoprolol (TOPROL-XL) 200 MG 24 hr tablet Take 1 tablet (200 mg total) by mouth daily. Take with or immediately following a meal.   nitroGLYCERIN (NITROSTAT) 0.4 MG SL tablet  Place 1 tablet (0.4 mg total) under the tongue every 5 (five) minutes as needed for chest pain. (Patient not taking: Reported on 04/16/2022)   omeprazole (PRILOSEC) 40 MG capsule Take 1 capsule (40 mg total) by mouth daily.   ondansetron (ZOFRAN) 4 MG tablet Take 1 tablet (4 mg total) by mouth 2 (two) times daily as needed for nausea or vomiting.   predniSONE (DELTASONE) 20 MG tablet Take '40mg'$  daily   rivaroxaban (XARELTO) 20 MG TABS tablet TAKE 1 TABLET BY MOUTH ONCE DAILY WITH SUPPER (Patient taking differently: 20 mg daily.)   Saccharomyces boulardii (PROBIOTIC) 250 MG CAPS Take 250 mg by mouth daily.   sulfamethoxazole-trimethoprim (BACTRIM DS) 800-160 MG tablet Take 1 tablet by mouth 3 (three) times a week.   vitamin B-12 (CYANOCOBALAMIN) 500 MCG tablet Take 500 mcg by mouth daily.   Facility-Administered Encounter Medications as of 05/10/2022  Medication   cyanocobalamin ((VITAMIN B-12)) injection 1,000 mcg    Allergies (verified) Patient has no known allergies.   History: Past Medical History:  Diagnosis Date   Anemia 05/08/2015   CML (chronic myelocytic leukemia) ([place]) 12/16/2011   Coronary heart disease    CVA (cerebrovascular accident) ([place])    GERD (gastroesophageal reflux disease)    Grief reaction 03/18/2014   H/O tobacco use, presenting hazards to health 02/24/2012   No cigarettes since pacemaker placement. Encouraged ongoing efforts    History of chicken pox    History of kidney stones    Hyperlipidemia    Hypertension    IBS (irritable bowel syndrome) 09/20/2013   Medicare annual wellness visit, subsequent 03/23/2014   PAF (paroxysmal atrial fibrillation) ([facility])    Symptomatic bradycardia    a. symptomatic bradycardia due to sinus node dysfunction s/p RA and RV Medtronic pacemaker 04/15/16   Past Surgical History:  Procedure Laterality Date   CATARACT EXTRACTION     CHOLECYSTECTOMY  2003   CORONARY ANGIOPLASTY WITH STENT PLACEMENT  2000   EP IMPLANTABLE DEVICE N/A  04/15/2016   Procedure: Pacemaker Implant;  Surgeon: Evans Lance, MD; Medtronic (serial number [id number]) pacemaker; Laterality: Left   RIGHT/LEFT HEART CATH AND CORONARY ANGIOGRAPHY N/A 02/18/2022   Procedure: RIGHT/LEFT HEART CATH AND CORONARY ANGIOGRAPHY;  Surgeon: Martinique, Peter M, MD;  Location: [facility];  Service: Cardiovascular;  Laterality: N/A;   Family History  Problem Relation Age of Onset   Arthritis Mother    Hypertension Mother    Heart disease Mother        Rheumatic fever   Rheumatic fever Mother    Other Mother        brain tumor   Cancer Mother        leukemia   Hypertension Father    Alcohol abuse Father    Diabetes Son    Colon cancer Neg Hx  Breast cancer Neg Hx    Prostate cancer Neg Hx    Social History   Socioeconomic History   Marital status: Widowed    Spouse name: Not on file   Number of children: 3   Years of education: Not on file   Highest education level: Not on file  Occupational History   Occupation: Retired    Comment: Retired  Tobacco Use   Smoking status: Former    Packs/day: 0.50    Years: 42.00    Pack years: 21.00    Types: Cigarettes    Start date: 10/30/1967    Quit date: 04/14/2016    Years since quitting: 6.0   Smokeless tobacco: Never  Vaping Use   Vaping Use: Never used  Substance and Sexual Activity   Alcohol use: No    Alcohol/week: 0.0 standard drinks    Comment: Rare   Drug use: No   Sexual activity: Never    Comment: wife died in January 11, 2023 married 75 years, grandson livew with patient  Other Topics Concern   Not on file  Social History Narrative   Not on file   Social Determinants of Health   Financial Resource Strain: Not on file  Food Insecurity: Not on file  Transportation Needs: Not on file  Physical Activity: Not on file  Stress: Not on file  Social Connections: Not on file    Tobacco Counseling Counseling given: Not Answered   Clinical Intake:                  Diabetic?No         Activities of Daily Living    02/15/2022    4:00 PM  In your present state of health, do you have any difficulty performing the following activities:  Hearing? 0  Vision? 0  Difficulty concentrating or making decisions? 0  Walking or climbing stairs? 0  Dressing or bathing? 0  Doing errands, shopping? 0    Patient Care Team: Mosie Lukes, MD as PCP - General (Family Medicine) Constance Haw, MD as PCP - Electrophysiology (Cardiology) Sueanne Margarita, MD as PCP - Cardiology (Cardiology) Constance Haw, MD as Consulting Physician (Cardiology)  Indicate any recent Medical Services you may have received from other than [facility] providers in the past year (date may be approximate).     Assessment:   This is a routine wellness examination for R[place].  Hearing/Vision screen No results found.  Dietary issues and exercise activities discussed:     Goals Addressed   None    Depression Screen    03/05/2021   10:29 AM 03/04/2020    9:35 AM 10/04/2019   10:22 AM 09/22/2018   10:00 AM 09/15/2017   11:06 AM 08/06/2016    1:15 PM 05/27/2015    9:43 AM  PHQ 2/9 Scores  PHQ - 2 Score 1 0 0 0 0 0 0    Fall Risk    03/05/2021   10:27 AM 03/04/2020    9:34 AM 10/04/2019   10:22 AM 09/22/2018   10:00 AM 09/15/2017   11:06 AM  Fall Risk   Falls in the past year? 1 0 0 No No  Number falls in past yr: 0 0 0    Injury with Fall? 0 0 0    Follow up Falls prevention discussed Education provided;Falls prevention discussed       FALL RISK PREVENTION PERTAINING TO THE HOME:  Any stairs in or around the home? {YES/NO:21197} If  so, are there any without handrails? {YES/NO:21197} Home free of loose throw rugs in walkways, pet beds, electrical cords, etc? {YES/NO:21197} Adequate lighting in your home to reduce risk of falls? {YES/NO:21197}  ASSISTIVE DEVICES UTILIZED TO PREVENT FALLS:  Life alert? {YES/NO:21197} Use of a cane, walker or w/c?  {YES/NO:21197} Grab bars in the bathroom? {YES/NO:21197} Shower chair or bench in shower? {YES/NO:21197} Elevated toilet seat or a handicapped toilet? {YES/NO:21197}  TIMED UP AND GO:  Was the test performed? No . Phone visit   Cognitive Function:    10/04/2019   10:26 AM  MMSE - Mini Mental State Exam  Not completed: Refused        Immunizations Immunization History  Administered Date(s) Administered   Fluad Quad(high Dose 65+) 09/11/2020, 11/12/2021   Pneumococcal Conjugate-13 11/07/2015   Pneumococcal Polysaccharide-23 09/15/2017   Tdap 11/07/2015    TDAP status: Up to date  Flu Vaccine status: Up to date  Pneumococcal vaccine status: Up to date  {Covid-19 vaccine status:2101808}  Qualifies for Shingles Vaccine? Yes   Zostavax completed No   Shingrix Completed?: No.    Education has been provided regarding the importance of this vaccine. Patient has been advised to call insurance company to determine out of pocket expense if they have not yet received this vaccine. Advised may also receive vaccine at local pharmacy or Health Dept. Verbalized acceptance and understanding.  Screening Tests Health Maintenance  Topic Date Due   COVID-19 Vaccine (1) Never done   Zoster Vaccines- Shingrix (1 of 2) Never done   COLONOSCOPY (Pts 45-44yr Insurance coverage will need to be confirmed)  09/01/2018   INFLUENZA VACCINE  07/27/2022   TETANUS/TDAP  11/06/2025   Pneumonia Vaccine 76 Years old  Completed   Hepatitis C Screening  Completed   HPV VACCINES  Aged Out    Health Maintenance  Health Maintenance Due  Topic Date Due   COVID-19 Vaccine (1) Never done   Zoster Vaccines- Shingrix (1 of 2) Never done   COLONOSCOPY (Pts 45-471yrInsurance coverage will need to be confirmed)  09/01/2018    {Colorectal cancer screening:2101809}  Lung Cancer Screening: (Low Dose CT Chest recommended if Age 74-80ears, 30 pack-year currently smoking OR have quit w/in 15years.) does  not qualify.     Additional Screening:  Hepatitis C Screening: Completed 11/07/2015  Vision Screening: Recommended annual ophthalmology exams for early detection of glaucoma and other disorders of the eye. Is the patient up to date with their annual eye exam?  {YES/NO:21197} Who is the provider or what is the name of the office in which the patient attends annual eye exams? *** If pt is not established with a provider, would they like to be referred to a provider to establish care? {YES/NO:21197}.   Dental Screening: Recommended annual dental exams for proper oral hygiene  Community Resource Referral / Chronic Care Management: CRR required this visit?  {YES/NO:21197}  CCM required this visit?  {YES/NO:21197}     Plan:     I have personally reviewed and noted the following in the patient's chart:   Medical and social history Use of alcohol, tobacco or illicit drugs  Current medications and supplements including opioid prescriptions. {Opioid Prescriptions:[phone]} Functional ability and status Nutritional status Physical activity Advanced directives List of other physicians Hospitalizations, surgeries, and ER visits in previous 12 months Vitals Screenings to include cognitive, depression, and falls Referrals and appointments  In addition, I have reviewed and discussed with patient certain preventive protocols, quality metrics, and  best practice recommendations. A written personalized care plan for preventive services as well as general preventive health recommendations were provided to patient.   Due to this being a telephonic visit, the after visit summary with patients personalized plan was offered to patient via mail or my-chart. ***Patient declined at this time./ Patient would like to access on my-chart/ per request, patient was mailed a copy of AVS./ Patient preferred to pick up at office at next visit.   Marta Antu, LPN   [id number]  Nurse Health  Advisor  Nurse Notes: ***

## 2022-05-11 ENCOUNTER — Ambulatory Visit (HOSPITAL_BASED_OUTPATIENT_CLINIC_OR_DEPARTMENT_OTHER): Payer: Medicare Other

## 2022-05-18 NOTE — Telephone Encounter (Signed)
Called [facility], death certificate has been signed.

## 2022-05-27 ENCOUNTER — Telehealth: Payer: Commercial Managed Care - HMO | Admitting: Family Medicine

## 2022-05-27 NOTE — Telephone Encounter (Signed)
Jason [name] young called from [facility] regarding blyth signing off of pt's death certificate. Please contact [phone] ?

## 2022-05-27 DEATH — deceased

## 2022-06-14 ENCOUNTER — Encounter: Payer: Medicare Other | Admitting: Cardiology

## 2022-07-14 ENCOUNTER — Ambulatory Visit: Payer: Medicare Other | Admitting: Hematology & Oncology

## 2022-07-14 ENCOUNTER — Inpatient Hospital Stay: Payer: Medicare Other

## 2022-07-21 ENCOUNTER — Ambulatory Visit: Payer: Medicare Other | Admitting: Cardiology
